# Patient Record
Sex: Male | Born: 1960
Health system: Southern US, Community
[De-identification: ages and names within clinical notes are randomized; demographics above are authoritative.]

## PROBLEM LIST (undated history)

## (undated) DIAGNOSIS — F32A Depression, unspecified: Secondary | ICD-10-CM

## (undated) DIAGNOSIS — I10 Essential (primary) hypertension: Secondary | ICD-10-CM

## (undated) DIAGNOSIS — M1990 Unspecified osteoarthritis, unspecified site: Secondary | ICD-10-CM

## (undated) DIAGNOSIS — M519 Unspecified thoracic, thoracolumbar and lumbosacral intervertebral disc disorder: Secondary | ICD-10-CM

## (undated) DIAGNOSIS — M5126 Other intervertebral disc displacement, lumbar region: Secondary | ICD-10-CM

## (undated) DIAGNOSIS — M62838 Other muscle spasm: Secondary | ICD-10-CM

## (undated) DIAGNOSIS — F419 Anxiety disorder, unspecified: Secondary | ICD-10-CM

## (undated) DIAGNOSIS — F329 Major depressive disorder, single episode, unspecified: Secondary | ICD-10-CM

## (undated) DIAGNOSIS — K5900 Constipation, unspecified: Secondary | ICD-10-CM

## (undated) HISTORY — PX: FOOT SURGERY: SHX648

## (undated) HISTORY — DX: Constipation, unspecified: K59.00

## (undated) HISTORY — PX: POLYPECTOMY: SHX149

## (undated) HISTORY — DX: Other muscle spasm: M62.838

## (undated) HISTORY — PX: COLONOSCOPY: SHX174

---

## 1998-03-01 ENCOUNTER — Emergency Department (HOSPITAL_COMMUNITY): Admission: EM | Admit: 1998-03-01 | Discharge: 1998-03-01 | Payer: Self-pay | Admitting: Emergency Medicine

## 2001-09-02 ENCOUNTER — Emergency Department (HOSPITAL_COMMUNITY): Admission: EM | Admit: 2001-09-02 | Discharge: 2001-09-02 | Payer: Self-pay | Admitting: Emergency Medicine

## 2001-11-10 ENCOUNTER — Emergency Department (HOSPITAL_COMMUNITY): Admission: EM | Admit: 2001-11-10 | Discharge: 2001-11-10 | Payer: Self-pay | Admitting: Emergency Medicine

## 2002-01-02 ENCOUNTER — Emergency Department (HOSPITAL_COMMUNITY): Admission: EM | Admit: 2002-01-02 | Discharge: 2002-01-02 | Payer: Self-pay | Admitting: Emergency Medicine

## 2002-01-02 ENCOUNTER — Encounter: Payer: Self-pay | Admitting: Emergency Medicine

## 2002-02-05 ENCOUNTER — Emergency Department (HOSPITAL_COMMUNITY): Admission: EM | Admit: 2002-02-05 | Discharge: 2002-02-05 | Payer: Self-pay | Admitting: Emergency Medicine

## 2003-11-20 ENCOUNTER — Emergency Department (HOSPITAL_COMMUNITY): Admission: EM | Admit: 2003-11-20 | Discharge: 2003-11-20 | Payer: Self-pay | Admitting: Emergency Medicine

## 2004-02-04 ENCOUNTER — Emergency Department (HOSPITAL_COMMUNITY): Admission: EM | Admit: 2004-02-04 | Discharge: 2004-02-04 | Payer: Self-pay | Admitting: Emergency Medicine

## 2007-04-17 ENCOUNTER — Emergency Department (HOSPITAL_COMMUNITY): Admission: EM | Admit: 2007-04-17 | Discharge: 2007-04-17 | Payer: Self-pay | Admitting: Emergency Medicine

## 2009-02-14 ENCOUNTER — Emergency Department (HOSPITAL_COMMUNITY): Admission: EM | Admit: 2009-02-14 | Discharge: 2009-02-14 | Payer: Self-pay | Admitting: Emergency Medicine

## 2009-03-03 ENCOUNTER — Emergency Department (HOSPITAL_COMMUNITY): Admission: EM | Admit: 2009-03-03 | Discharge: 2009-03-03 | Payer: Self-pay | Admitting: Emergency Medicine

## 2009-05-23 ENCOUNTER — Emergency Department (HOSPITAL_COMMUNITY): Admission: EM | Admit: 2009-05-23 | Discharge: 2009-05-23 | Payer: Self-pay | Admitting: Emergency Medicine

## 2009-08-13 ENCOUNTER — Encounter (INDEPENDENT_AMBULATORY_CARE_PROVIDER_SITE_OTHER): Payer: Self-pay | Admitting: Family Medicine

## 2009-08-13 ENCOUNTER — Ambulatory Visit: Payer: Self-pay | Admitting: Internal Medicine

## 2009-08-13 LAB — CONVERTED CEMR LAB
ALT: 47 units/L (ref 0–53)
AST: 42 units/L — ABNORMAL HIGH (ref 0–37)
Albumin: 4.2 g/dL (ref 3.5–5.2)
Alkaline Phosphatase: 77 units/L (ref 39–117)
BUN: 12 mg/dL (ref 6–23)
Basophils Absolute: 0.1 10*3/uL (ref 0.0–0.1)
Basophils Relative: 1 % (ref 0–1)
CO2: 26 meq/L (ref 19–32)
Calcium: 8.7 mg/dL (ref 8.4–10.5)
Chloride: 103 meq/L (ref 96–112)
Cholesterol: 150 mg/dL (ref 0–200)
Creatinine, Ser: 1.29 mg/dL (ref 0.40–1.50)
Eosinophils Absolute: 0.2 10*3/uL (ref 0.0–0.7)
Eosinophils Relative: 3 % (ref 0–5)
Glucose, Bld: 87 mg/dL (ref 70–99)
HCT: 43.7 % (ref 39.0–52.0)
HCV Ab: REACTIVE — AB
HDL: 54 mg/dL (ref >39)
Hemoglobin: 15 g/dL (ref 13.0–17.0)
Hep A Total Ab: POSITIVE — AB
Hep B Core Total Ab: POSITIVE — AB
Hep B S Ab: POSITIVE — AB
Hepatitis B Surface Ag: NEGATIVE
LDL Cholesterol: 84 mg/dL (ref 0–99)
Lymphocytes Relative: 34 % (ref 12–46)
Lymphs Abs: 2 10*3/uL (ref 0.7–4.0)
MCHC: 34.3 g/dL (ref 30.0–36.0)
MCV: 86.9 fL (ref 78.0–100.0)
Monocytes Absolute: 0.7 10*3/uL (ref 0.1–1.0)
Monocytes Relative: 12 % (ref 3–12)
Neutro Abs: 2.9 10*3/uL (ref 1.7–7.7)
Neutrophils Relative %: 50 % (ref 43–77)
Platelets: 201 10*3/uL (ref 150–400)
Potassium: 4.6 meq/L (ref 3.5–5.3)
RBC: 5.03 M/uL (ref 4.22–5.81)
RDW: 14.3 % (ref 11.5–15.5)
Sodium: 140 meq/L (ref 135–145)
Total Bilirubin: 0.7 mg/dL (ref 0.3–1.2)
Total CHOL/HDL Ratio: 2.8
Total Protein: 7.2 g/dL (ref 6.0–8.3)
Triglycerides: 58 mg/dL (ref <150)
VLDL: 12 mg/dL (ref 0–40)
WBC: 5.8 10*3/uL (ref 4.0–10.5)

## 2009-09-12 ENCOUNTER — Ambulatory Visit: Payer: Self-pay | Admitting: Internal Medicine

## 2009-09-12 ENCOUNTER — Encounter (INDEPENDENT_AMBULATORY_CARE_PROVIDER_SITE_OTHER): Payer: Self-pay | Admitting: Family Medicine

## 2009-09-12 LAB — CONVERTED CEMR LAB: HCV Quantitative: 901000 intl units/mL — ABNORMAL HIGH (ref <43)

## 2009-10-27 ENCOUNTER — Ambulatory Visit: Payer: Self-pay | Admitting: Family Medicine

## 2009-12-04 ENCOUNTER — Ambulatory Visit: Payer: Self-pay | Admitting: Gastroenterology

## 2010-01-22 ENCOUNTER — Ambulatory Visit (HOSPITAL_COMMUNITY): Admission: RE | Admit: 2010-01-22 | Discharge: 2010-01-22 | Payer: Self-pay | Admitting: Gastroenterology

## 2010-04-20 ENCOUNTER — Emergency Department (HOSPITAL_COMMUNITY): Admission: EM | Admit: 2010-04-20 | Discharge: 2010-04-20 | Payer: Self-pay | Admitting: Emergency Medicine

## 2010-10-11 LAB — BASIC METABOLIC PANEL
BUN: 13 mg/dL (ref 6–23)
CO2: 26 mEq/L (ref 19–32)
Calcium: 8.9 mg/dL (ref 8.4–10.5)
Chloride: 107 mEq/L (ref 96–112)
Creatinine, Ser: 1.04 mg/dL (ref 0.4–1.5)
GFR calc Af Amer: >60 mL/min (ref >60)
GFR calc non Af Amer: >60 mL/min (ref >60)
Glucose, Bld: 92 mg/dL (ref 70–99)
Potassium: 4 mEq/L (ref 3.5–5.1)
Sodium: 141 mEq/L (ref 135–145)

## 2010-10-11 LAB — CBC
HCT: 46.5 % (ref 39.0–52.0)
MCH: 31.1 pg (ref 26.0–34.0)
MCHC: 34.1 g/dL (ref 30.0–36.0)
MCV: 91 fL (ref 78.0–100.0)
Platelets: 190 10*3/uL (ref 150–400)
RDW: 14.5 % (ref 11.5–15.5)
WBC: 6.2 10*3/uL (ref 4.0–10.5)

## 2010-10-11 LAB — APTT: aPTT: 29 seconds (ref 24–37)

## 2011-01-05 ENCOUNTER — Emergency Department (HOSPITAL_COMMUNITY)
Admission: EM | Admit: 2011-01-05 | Discharge: 2011-01-05 | Disposition: A | Discharge status: Home / Self Care | Payer: Self-pay | Attending: Emergency Medicine | Admitting: Emergency Medicine

## 2011-01-05 DIAGNOSIS — H5789 Other specified disorders of eye and adnexa: Secondary | ICD-10-CM | POA: Insufficient documentation

## 2011-01-05 DIAGNOSIS — Z8619 Personal history of other infectious and parasitic diseases: Secondary | ICD-10-CM | POA: Insufficient documentation

## 2011-01-05 DIAGNOSIS — H00019 Hordeolum externum unspecified eye, unspecified eyelid: Secondary | ICD-10-CM | POA: Insufficient documentation

## 2011-01-05 DIAGNOSIS — H5710 Ocular pain, unspecified eye: Secondary | ICD-10-CM | POA: Insufficient documentation

## 2011-03-29 ENCOUNTER — Emergency Department (HOSPITAL_COMMUNITY)
Admission: EM | Admit: 2011-03-29 | Discharge: 2011-03-29 | Disposition: A | Discharge status: Home / Self Care | Payer: Self-pay | Attending: Emergency Medicine | Admitting: Emergency Medicine

## 2011-03-29 DIAGNOSIS — B1920 Unspecified viral hepatitis C without hepatic coma: Secondary | ICD-10-CM | POA: Insufficient documentation

## 2011-03-29 DIAGNOSIS — H0019 Chalazion unspecified eye, unspecified eyelid: Secondary | ICD-10-CM | POA: Insufficient documentation

## 2011-03-29 DIAGNOSIS — H5789 Other specified disorders of eye and adnexa: Secondary | ICD-10-CM | POA: Insufficient documentation

## 2011-05-06 LAB — URINALYSIS, ROUTINE W REFLEX MICROSCOPIC
Bilirubin Urine: NEGATIVE
Glucose, UA: NEGATIVE
Hgb urine dipstick: NEGATIVE
Ketones, ur: NEGATIVE
Specific Gravity, Urine: 1.013
pH: 6

## 2011-07-12 ENCOUNTER — Encounter: Payer: Self-pay | Admitting: Emergency Medicine

## 2011-07-12 ENCOUNTER — Emergency Department (HOSPITAL_COMMUNITY)
Admission: EM | Admit: 2011-07-12 | Discharge: 2011-07-12 | Disposition: A | Discharge status: Home / Self Care | Payer: Self-pay | Attending: Emergency Medicine | Admitting: Emergency Medicine

## 2011-07-12 DIAGNOSIS — H53149 Visual discomfort, unspecified: Secondary | ICD-10-CM | POA: Insufficient documentation

## 2011-07-12 DIAGNOSIS — H5710 Ocular pain, unspecified eye: Secondary | ICD-10-CM | POA: Insufficient documentation

## 2011-07-12 DIAGNOSIS — H109 Unspecified conjunctivitis: Secondary | ICD-10-CM | POA: Insufficient documentation

## 2011-07-12 MED ORDER — TETRACAINE HCL 0.5 % OP SOLN
1.0000 [drp] | Freq: Once | OPHTHALMIC | Status: AC
  Administered 2011-07-12: 1 [drp] via OPHTHALMIC
  Filled 2011-07-12: qty 2

## 2011-07-12 MED ORDER — FLUORESCEIN SODIUM 1 MG OP STRP
2.0000 | ORAL_STRIP | Freq: Once | OPHTHALMIC | Status: AC
  Administered 2011-07-12: 2 via OPHTHALMIC
  Filled 2011-07-12: qty 2

## 2011-07-12 MED ORDER — NEOMYCIN-POLYMYXIN-HC 5-10000-1 OP SUSP: 2.0000 [drp] | Freq: Four times a day (QID) | OPHTHALMIC | Status: AC

## 2011-07-12 NOTE — ED Provider Notes (Signed)
History     CSN: [id number] Arrival date & time: 07/12/2011 11:21 AM   First MD Initiated Contact with Patient 07/12/11 1534      Chief Complaint  Patient presents with  . Eye Pain    (Consider location/radiation/quality/duration/timing/severity/associated sxs/prior treatment) Patient is a 50 y.o. male presenting with eye pain.  Eye Pain  Patient is a 50 year old male who presents today complaining of 2 days of bilateral eye pain. He says that the pain is more severe in the morning. He describes this as mild. Patient has a gritty sensation in his eyes as well as photophobia. He has no associated nausea, vomiting, or fevers. Patient has no history of medical problems. He has had history of chalazion in the past. He's been treated by an ophthalmologist for this with antibiotics. He feels that though there are residual lumps underneath his eyelid that these are improving. He does not have any known sick contacts with conjunctivitis. Patient denies any upper respiratory symptoms. He has bilateral scleral injection. He has no changes in his visual acuity. There are no other associated or modifying factors.  History reviewed. No pertinent past medical history.  History reviewed. No pertinent past surgical history.  History reviewed. No pertinent family history.  History  Substance Use Topics  . Smoking status: Current Everyday Smoker  . Smokeless tobacco: Not on file  . Alcohol Use: No      Review of Systems  Constitutional: Negative.   HENT: Negative.   Eyes: Positive for photophobia, pain, discharge, redness and itching. Negative for visual disturbance.  Respiratory: Negative.   Cardiovascular: Negative.   Gastrointestinal: Negative.   Genitourinary: Negative.   Musculoskeletal: Negative.   Skin: Negative.   Neurological: Negative.   Hematological: Negative.   Psychiatric/Behavioral: Negative.   All other systems reviewed and are negative.    Allergies  Review of  patient's allergies indicates no known allergies.  Home Medications   Current Outpatient Rx  Name Route Sig Dispense Refill  . ACETAMINOPHEN 500 MG PO TABS Oral Take 1,000 mg by mouth every 6 (six) hours as needed. For pain     . IBUPROFEN 200 MG PO TABS Oral Take 600 mg by mouth every 6 (six) hours as needed. For pain     . THERA M PLUS PO TABS Oral Take 1 tablet by mouth daily.        BP 141/98  Pulse 70  Temp(Src) 98.2 F (36.8 C) (Oral)  Resp 16  SpO2 99%  Physical Exam  Nursing note and vitals reviewed. Constitutional: He is oriented to person, place, and time. He appears well-developed and well-nourished. No distress.  HENT:  Head: Normocephalic and atraumatic.  Eyes: EOM are normal. Pupils are equal, round, and reactive to light. Right eye exhibits no hordeolum. Left eye exhibits no hordeolum. Right conjunctiva is injected. Left conjunctiva is injected.  Slit lamp exam:      The right eye shows no corneal abrasion, no corneal flare, no corneal ulcer, no foreign body and no fluorescein uptake.       The left eye shows no corneal abrasion, no corneal flare, no corneal ulcer, no foreign body and no fluorescein uptake.         Graphic indicates location of what appear to be chalazions which has been identified and managed previously by patient's ophthalmologist. Denies any tenderness to these today and states that they have improved. Fluroscein  testing is negative  Neck: Normal range of motion.  Musculoskeletal: Normal range of  motion.  Neurological: He is alert and oriented to person, place, and time. No cranial nerve deficit. He exhibits normal muscle tone. Coordination normal.  Skin: Skin is warm and dry. No rash noted.  Psychiatric: He has a normal mood and affect.    ED Course  Procedures (including critical care time)  Labs Reviewed - No data to display No results found.   1. Conjunctivitis       MDM  Patient was evaluated by myself. He had symptoms for 2  days of possible conjunctivitis. Patient does have a history of chalazion. He did not feel like this is the problem today. Patient is not a contact lens wearer. Fluorescein exam as well as slit-lamp exam were performed.  4:40 PM Patient had unimpressive slit-lamp exam. He did have bilateral conjunctival injection. Patient was told to followup with his ophthalmologist. He reports that he only came here today as he was unable to be seen until Thursday. Patient was discharged with a prescription for antibiotic eyedrops. Patient was comfortable with plan and left in good condition.       Cyndra Numbers, MD 07/12/11 [phone]

## 2011-07-12 NOTE — ED Notes (Signed)
Pt presents to department for evaluation of bilateral eye pain and irritation. Onset yesterday morning. Pt states redness and itching to both eyes. Also states blurred vision. 7/10 pain at the time. No signs of distress noted.

## 2011-07-12 NOTE — ED Notes (Signed)
Pt c/o bilateral eye pain and redness with itching starting yesterday

## 2012-02-07 ENCOUNTER — Emergency Department (HOSPITAL_COMMUNITY)
Admission: EM | Admit: 2012-02-07 | Discharge: 2012-02-07 | Disposition: A | Discharge status: Home Health | Payer: Self-pay | Attending: Emergency Medicine | Admitting: Emergency Medicine

## 2012-02-07 ENCOUNTER — Encounter (HOSPITAL_COMMUNITY): Payer: Self-pay | Admitting: Emergency Medicine

## 2012-02-07 ENCOUNTER — Emergency Department (HOSPITAL_COMMUNITY): Payer: Self-pay

## 2012-02-07 DIAGNOSIS — F17200 Nicotine dependence, unspecified, uncomplicated: Secondary | ICD-10-CM | POA: Insufficient documentation

## 2012-02-07 DIAGNOSIS — M25519 Pain in unspecified shoulder: Secondary | ICD-10-CM

## 2012-02-07 DIAGNOSIS — M19019 Primary osteoarthritis, unspecified shoulder: Secondary | ICD-10-CM | POA: Insufficient documentation

## 2012-02-07 MED ORDER — METHOCARBAMOL 500 MG PO TABS: ORAL_TABLET | ORAL | Status: AC

## 2012-02-07 MED ORDER — IBUPROFEN 400 MG PO TABS
600.0000 mg | ORAL_TABLET | Freq: Once | ORAL | Status: AC
  Administered 2012-02-07: 600 mg via ORAL
  Filled 2012-02-07: qty 1

## 2012-02-07 MED ORDER — IBUPROFEN 600 MG PO TABS: 600.0000 mg | ORAL_TABLET | Freq: Three times a day (TID) | ORAL | Status: AC | PRN

## 2012-02-07 NOTE — ED Provider Notes (Signed)
History    This chart was scribed for Suzi Roots, MD, MD by Smitty Pluck. The patient was seen in room TR05C and the patient's care was started at 11:42AM.   CSN: [id number]  Arrival date & time 02/07/12  1115   None     Chief Complaint  Patient presents with  . Shoulder Pain    (Consider location/radiation/quality/duration/timing/severity/associated sxs/prior treatment) Patient is a 51 y.o. male presenting with shoulder pain. The history is provided by the patient.  Shoulder Pain Pertinent negatives include no chest pain and no shortness of breath.   Race Knoblock is a 51 y.o. male who presents to the Emergency Department complaining of constant moderate left shoulder pain onset 3 weeks ago. Pt reports that he has intermittent radiation to elbow and left fingers. Denies injury. Reports having similar symptoms in the past but they subsided on their own. Reports this time is worst. Pt is right hand dominant. Denies fevers. Reports mild neck pain. Movement aggravates the shoulder pain. Denies weakness in left arm. No hx ddd. No neck/ back or shoulder injury. No skin changes, rash or erythema. No loss of sensation or weakness. Right hand dominant.   History reviewed. No pertinent past medical history.  History reviewed. No pertinent past surgical history.  History reviewed. No pertinent family history.  History  Substance Use Topics  . Smoking status: Current Everyday Smoker  . Smokeless tobacco: Not on file  . Alcohol Use: No      Review of Systems  Constitutional: Negative for fever and chills.  Respiratory: Negative for shortness of breath.   Cardiovascular: Negative for chest pain.  Gastrointestinal: Negative for nausea and vomiting.  Neurological: Negative for weakness and numbness.    Allergies  Review of patient's allergies indicates no known allergies.  Home Medications   Current Outpatient Rx  Name Route Sig Dispense Refill  . IBUPROFEN 200 MG PO TABS Oral  Take 1,000 mg by mouth 2 (two) times daily as needed. For pain    . THERA M PLUS PO TABS Oral Take 1 tablet by mouth daily.        BP 147/102  Pulse 68  Temp 98 F (36.7 C) (Oral)  Resp 16  SpO2 100%  Physical Exam  Nursing note and vitals reviewed. Constitutional: He is oriented to person, place, and time. He appears well-developed and well-nourished. No distress.  HENT:  Head: Normocephalic and atraumatic.  Eyes: EOM are normal.  Neck: Neck supple. No tracheal deviation present.  Cardiovascular: Normal rate.   Pulmonary/Chest: Effort normal. No respiratory distress.  Musculoskeletal: Normal range of motion.       Left shoulder tenderness, no sts, no erythema or rash to area of pain.  C/T spine non tender, aligned, no step off. No pain w full rom c spine.  Good rom at left shoulder elbow and wrist without pain. Distal pulses palp.    Neurological: He is alert and oriented to person, place, and time.       LUE nvi with intact radial, median, ulnar nerve function.   Skin: Skin is warm and dry.  Psychiatric: He has a normal mood and affect. His behavior is normal.    ED Course  Procedures (including critical care time) DIAGNOSTIC STUDIES: Oxygen Saturation is 100% on room air, normal by my interpretation.    COORDINATION OF CARE: 12:00PM EDP ordered medication: 600 mg ibuprofen    Dg Shoulder Left  02/07/2012  *RADIOLOGY REPORT*  Clinical Data: Left shoulder pain.  LEFT SHOULDER - 2+ VIEW  Comparison: None.  Findings: No fracture or dislocation.  Mild degenerative changes in the left acromioclavicular joint.  Visualized portion of the left chest is unremarkable.  IMPRESSION: Mild left acromioclavicular joint osteoarthritis.  Original Report Authenticated By: Reyes Ivan, M.D.       MDM  I personally performed the services described in this documentation, which was scribed in my presence. The recorded information has been reviewed and considered. Suzi Roots,  MD    Motrin po.    Suzi Roots, MD 02/07/12 1228

## 2012-02-07 NOTE — ED Notes (Signed)
Pt c/o left shoulder pain x 3 weeks; pt denies obvious injury but sts hx of same; pt sts worse with movement

## 2012-02-18 ENCOUNTER — Emergency Department (HOSPITAL_COMMUNITY)
Admission: EM | Admit: 2012-02-18 | Discharge: 2012-02-19 | Disposition: A | Discharge status: Home / Self Care | Payer: Self-pay | Attending: Emergency Medicine | Admitting: Emergency Medicine

## 2012-02-18 ENCOUNTER — Encounter (HOSPITAL_COMMUNITY): Payer: Self-pay | Admitting: *Deleted

## 2012-02-18 DIAGNOSIS — G8929 Other chronic pain: Secondary | ICD-10-CM

## 2012-02-18 DIAGNOSIS — M25519 Pain in unspecified shoulder: Secondary | ICD-10-CM | POA: Insufficient documentation

## 2012-02-18 DIAGNOSIS — M5412 Radiculopathy, cervical region: Secondary | ICD-10-CM

## 2012-02-18 DIAGNOSIS — M542 Cervicalgia: Secondary | ICD-10-CM | POA: Insufficient documentation

## 2012-02-18 DIAGNOSIS — F17200 Nicotine dependence, unspecified, uncomplicated: Secondary | ICD-10-CM | POA: Insufficient documentation

## 2012-02-18 NOTE — ED Notes (Signed)
C/o shoulder injury, onset 2 weeks ago, seen here for the same, was given ibuprofen and muscle relaxant, ran out, some temporary relief with meds, asked to not be given narcotics at last visit. CMS intact. ROM intact.

## 2012-02-19 MED ORDER — CYCLOBENZAPRINE HCL 10 MG PO TABS: 10.0000 mg | ORAL_TABLET | Freq: Two times a day (BID) | ORAL | Status: AC | PRN

## 2012-02-19 MED ORDER — DICLOFENAC SODIUM 75 MG PO TBEC: 75.0000 mg | DELAYED_RELEASE_TABLET | Freq: Two times a day (BID) | ORAL | Status: DC

## 2012-02-19 MED ORDER — HYDROCODONE-ACETAMINOPHEN 5-325 MG PO TABS: 1.0000 | ORAL_TABLET | Freq: Every evening | ORAL | Status: AC | PRN

## 2012-02-19 NOTE — ED Provider Notes (Signed)
Medical screening examination/treatment/procedure(s) were performed by non-physician practitioner and as supervising physician I was immediately available for consultation/collaboration.   Glynn Octave, MD 02/19/12 [phone]

## 2012-02-19 NOTE — ED Provider Notes (Signed)
History     CSN: [id number]  Arrival date & time 02/18/12  2223   First MD Initiated Contact with Patient 02/19/12 0031      12:34 AM HPI Patient reports left shoulder pain for several months. Reports pain worsened approximately 2 weeks ago. Was seen here and given ibuprofen and muscle relaxers. States medication only helped minimally but he continues to have significant pain. Denies known reports at times pain shoots down left lower extremity and patient reports a tingling sensation in hand. Denies weakness. Reports associated with mild neck pain as well.  Patient is a 51 y.o. male presenting with shoulder pain. The history is provided by the patient.  Shoulder Pain This is a chronic problem. The problem occurs constantly. The problem has been gradually worsening. Associated symptoms include neck pain. Pertinent negatives include no abdominal pain, chest pain, joint swelling, nausea, numbness or weakness. Exacerbated by: Use of left shoulder. He has tried NSAIDs and rest for the symptoms. The treatment provided no relief.    History reviewed. No pertinent past medical history.  Past Surgical History  Procedure Date  . Foot surgery     right, ~ 2002    No family history on file.  History  Substance Use Topics  . Smoking status: Current Everyday Smoker  . Smokeless tobacco: Not on file  . Alcohol Use: No      Review of Systems  HENT: Positive for neck pain.   Cardiovascular: Negative for chest pain.  Gastrointestinal: Negative for nausea and abdominal pain.  Musculoskeletal: Negative for joint swelling.       Shoulder pain, neck pain  Neurological: Negative for weakness and numbness.  All other systems reviewed and are negative.    Allergies  Review of patient's allergies indicates no known allergies.  Home Medications   Current Outpatient Rx  Name Route Sig Dispense Refill  . IBUPROFEN 200 MG PO TABS Oral Take 1,000 mg by mouth 2 (two) times daily as needed. For  pain    . IBUPROFEN 600 MG PO TABS Oral Take 600 mg by mouth every 6 (six) hours as needed. pain    . THERA M PLUS PO TABS Oral Take 1 tablet by mouth daily.        BP 151/105  Pulse 76  Temp 98.6 F (37 C) (Oral)  Resp 16  SpO2 100%  Physical Exam  Constitutional: He is oriented to person, place, and time. He appears well-developed and well-nourished.  HENT:  Head: Normocephalic and atraumatic.  Eyes: Pupils are equal, round, and reactive to light.  Musculoskeletal:       Left shoulder: He exhibits tenderness, pain and spasm. He exhibits normal range of motion, no swelling, normal pulse and normal strength.       Left shoulder: Full range of motion. Tender to palpation over left superior trapezius muscle. Patient otherwise has normal strength, distal pulses, sensation, capillary refill. Patient also has reproduction of tingling sensation with percussion over cervical spine.  Neurological: He is alert and oriented to person, place, and time.  Skin: Skin is warm and dry. No rash noted. No erythema. No pallor.  Psychiatric: He has a normal mood and affect. His behavior is normal.    ED Course  Procedures   MDM   Patient reports an appointment is August and it. We will prescribe patient diclofenac and Flexeril. Will provide patient with Vicodin for pain. Patient voices understanding and is ready for d/c       Ronan Duecker  Cyndie Chime, PA-C 02/19/12 0155

## 2012-04-18 ENCOUNTER — Emergency Department (HOSPITAL_COMMUNITY): Payer: Self-pay

## 2012-04-18 ENCOUNTER — Encounter (HOSPITAL_COMMUNITY): Payer: Self-pay | Admitting: Emergency Medicine

## 2012-04-18 ENCOUNTER — Emergency Department (HOSPITAL_COMMUNITY)
Admission: EM | Admit: 2012-04-18 | Discharge: 2012-04-18 | Disposition: A | Discharge status: Home / Self Care | Payer: Self-pay | Attending: Emergency Medicine | Admitting: Emergency Medicine

## 2012-04-18 DIAGNOSIS — G8929 Other chronic pain: Secondary | ICD-10-CM

## 2012-04-18 DIAGNOSIS — M549 Dorsalgia, unspecified: Secondary | ICD-10-CM | POA: Insufficient documentation

## 2012-04-18 DIAGNOSIS — F17200 Nicotine dependence, unspecified, uncomplicated: Secondary | ICD-10-CM | POA: Insufficient documentation

## 2012-04-18 DIAGNOSIS — M5412 Radiculopathy, cervical region: Secondary | ICD-10-CM

## 2012-04-18 MED ORDER — IBUPROFEN 800 MG PO TABS: 800.0000 mg | ORAL_TABLET | Freq: Three times a day (TID) | ORAL | Status: DC

## 2012-04-18 MED ORDER — HYDROCODONE-ACETAMINOPHEN 5-325 MG PO TABS
2.0000 | ORAL_TABLET | Freq: Once | ORAL | Status: AC
  Administered 2012-04-18: 2 via ORAL
  Filled 2012-04-18: qty 2

## 2012-04-18 MED ORDER — PREDNISONE 20 MG PO TABS: 40.0000 mg | ORAL_TABLET | Freq: Every day | ORAL | Status: DC

## 2012-04-18 MED ORDER — HYDROCODONE-ACETAMINOPHEN 5-325 MG PO TABS: ORAL_TABLET | ORAL | Status: DC

## 2012-04-18 NOTE — ED Notes (Signed)
Patient transported to X-ray 

## 2012-04-18 NOTE — ED Notes (Signed)
Pt c/o left shoulder pain x 4 months; pt denies knowing obvious injury

## 2012-04-18 NOTE — ED Provider Notes (Signed)
History  This chart was scribed for Calvin [name]. Oletta Lamas, MD by Ladona Ridgel Day. This patient was seen in room TR06C/TR06C and the patient's care was started at 1401.   CSN: [id number]  Arrival date & time 04/18/12  1401   None     Chief Complaint  Patient presents with  . Shoulder Pain   The history is provided by the patient. No language interpreter was used.   Calvin [name] is a 51 y.o. male who presents to the Emergency Department complaining of constant gradually worsening radiating left shoulder pain for 4 months and denies trauma or injury to his shoulder. He states his pain goes from his posterior left shoulder to his left elbow. He denies any numbness/tingling. Pain has been constant, will wax and wane at time, but persists.  Was seen twice previously for same in July.  Pt reports has an appt with [facility] next month.  History reviewed. No pertinent past medical history.  Past Surgical History  Procedure Date  . Foot surgery     right, ~ 2002    History reviewed. No pertinent family history.  History  Substance Use Topics  . Smoking status: Current Every Day Smoker  . Smokeless tobacco: Not on file  . Alcohol Use: No      Review of Systems  Constitutional: Negative for fever and chills.  HENT: Positive for neck pain.   Eyes: Negative for redness.  Respiratory: Negative for shortness of breath.   Cardiovascular: Negative for chest pain, palpitations and leg swelling.  Gastrointestinal: Positive for abdominal pain. Negative for nausea, vomiting, constipation and blood in stool.  Musculoskeletal: Positive for arthralgias. Negative for myalgias and back pain.       Left shoulder tender/painful  Skin: Negative for rash and wound.  Neurological: Negative for weakness.    Allergies  Review of patient's allergies indicates no known allergies.  Home Medications   Current Outpatient Rx  Name Route Sig Dispense Refill  . ACETAMINOPHEN 325 MG PO TABS Oral Take 650 mg by  mouth every 6 (six) hours as needed. For pain    . IBUPROFEN 200 MG PO TABS Oral Take 1,000 mg by mouth 2 (two) times daily as needed. For pain    . THERA M PLUS PO TABS Oral Take 1 tablet by mouth daily.      Marland Kitchen HYDROCODONE-ACETAMINOPHEN 5-325 MG PO TABS  1-2 tablets po q 6 hours prn moderate to severe pain 20 tablet 0  . IBUPROFEN 800 MG PO TABS Oral Take 1 tablet (800 mg total) by mouth 3 (three) times daily. 21 tablet 0  . PREDNISONE 20 MG PO TABS Oral Take 2 tablets (40 mg total) by mouth daily. 14 tablet 0    Triage Vitals: BP 153/98  Pulse 72  Temp 98.5 F (36.9 C) (Oral)  Resp 16  SpO2 98%  Physical Exam  Nursing note and vitals reviewed. Constitutional: He is oriented to person, place, and time. He appears well-developed and well-nourished. No distress.  HENT:  Head: Normocephalic and atraumatic.  Eyes: EOM are normal. Pupils are equal, round, and reactive to light.  Neck: Neck supple. No tracheal deviation present.  Cardiovascular: Normal rate.   Pulmonary/Chest: Effort normal and breath sounds normal. No respiratory distress.  Abdominal: Soft. He exhibits no distension. There is no tenderness.  Musculoskeletal: He exhibits tenderness (Full ROM left shoulder with mild discomfort. tender to palpation over left superior trapezius muscle).       Left shoulder: He exhibits  decreased range of motion, tenderness and pain. He exhibits no bony tenderness, no swelling, no deformity, no laceration, normal pulse and normal strength.  Neurological: He is alert and oriented to person, place, and time. He has normal strength. He displays no atrophy. No sensory deficit. He exhibits normal muscle tone. Coordination and gait normal. GCS eye subscore is 4. GCS verbal subscore is 5. GCS motor subscore is 6.       5/5 strength of LUE   Skin: Skin is warm and dry.  Psychiatric: He has a normal mood and affect. His behavior is normal.    ED Course  Procedures (including critical care  time) DIAGNOSTIC STUDIES: Oxygen Saturation is 98% on room air, normal by my interpretation.    COORDINATION OF CARE: At 300 PM Discussed treatment plan with patient which includes left shoulder X-ray, steroids/pain medicine and follow up with orthopedist. Patient agrees.   Labs Reviewed - No data to display Dg Shoulder Left  04/18/2012  *RADIOLOGY REPORT*  Clinical Data: No reported injury.  Pain radiating down arm to the hand  LEFT SHOULDER - 2+ VIEW  Comparison: 02/07/2012  Findings: Left AC joint arthritis redemonstrated.  No glenohumeral fracture or dislocation.  Adjacent ribs intact.  IMPRESSION: Stable exam.  No acute findings.   Original Report Authenticated By: Elsie Stain, M.D.      1. Cervical radiculopathy   2. Chronic upper back pain     Pt's symptoms are not worse based on today's description and that given at end of July.  Pt has appt with ortho in a couple of weeks.  Pt is ok to keep already scheduled appt.  Will provide Rx for NSAIDs, narcotics and muscle relaxant.   MDM  I personally performed the services described in this documentation, which was scribed in my presence. The recorded information has been reviewed and considered.         Calvin [name]. Opie Maclaughlin, MD 04/19/12 1417

## 2012-04-18 NOTE — Discharge Instructions (Signed)
Cervical Radiculopathy Cervical radiculopathy happens when a nerve in the neck is pinched or bruised by a slipped (herniated) disk or by arthritic changes in the bones of the cervical spine. This can occur due to an injury or as part of the normal aging process. Pressure on the cervical nerves can cause pain or numbness that runs from your neck all the way down into your arm and fingers. CAUSES  There are many possible causes, including:  Injury.   Muscle tightness in the neck from overuse.   Swollen, painful joints (arthritis).   Breakdown or degeneration in the bones and joints of the spine (spondylosis) due to aging.   Bone spurs that may develop near the cervical nerves.  SYMPTOMS  Symptoms include pain, weakness, or numbness in the affected arm and hand. Pain can be severe or irritating. Symptoms may be worse when extending or turning the neck. DIAGNOSIS  Your caregiver will ask about your symptoms and do a physical exam. He or she may test your strength and reflexes. X-rays, CT scans, and MRI scans may be needed in cases of injury or if the symptoms do not go away after a period of time. Electromyography (EMG) or nerve conduction testing may be done to study how your nerves and muscles are working. TREATMENT  Your caregiver may recommend certain exercises to help relieve your symptoms. Cervical radiculopathy can, and often does, get better with time and treatment. If your problems continue, treatment options may include:  Wearing a soft collar for short periods of time.   Physical therapy to strengthen the neck muscles.   Medicines, such as nonsteroidal anti-inflammatory drugs (NSAIDs), oral corticosteroids, or spinal injections.   Surgery. Different types of surgery may be done depending on the cause of your problems.  HOME CARE INSTRUCTIONS   Put ice on the affected area.   Put ice in a plastic bag.   Place a towel between your skin and the bag.   Leave the ice on for 15  to 20 minutes, 3 to 4 times a day or as directed by your caregiver.   Use a flat pillow when you sleep.   Only take over-the-counter or prescription medicines for pain, discomfort, or fever as directed by your caregiver.   If physical therapy was prescribed, follow your caregiver's directions.   If a soft collar was prescribed, use it as directed.  SEEK IMMEDIATE MEDICAL CARE IF:   Your pain gets much worse and cannot be controlled with medicines.   You have weakness or numbness in your hand, arm, face, or leg.   You have a high fever or a stiff, rigid neck.   You lose bowel or bladder control (incontinence).   You have trouble with walking, balance, or speaking.  MAKE SURE YOU:   Understand these instructions.   Will watch your condition.   Will get help right away if you are not doing well or get worse.  Document Released: 04/06/2001 Document Revised: 07/01/2011 Document Reviewed: 02/23/2011 [facility] Patient Information 2012 [place], [place].    Narcotic and benzodiazepine use may cause drowsiness, slowed breathing or dependence.  Please use with caution and do not drive, operate machinery or watch young children alone while taking them.  Taking combinations of these medications or drinking alcohol will potentiate these effects.

## 2012-04-19 ENCOUNTER — Encounter (HOSPITAL_COMMUNITY): Payer: Self-pay | Admitting: Emergency Medicine

## 2012-05-05 ENCOUNTER — Encounter (HOSPITAL_COMMUNITY): Payer: Self-pay | Admitting: Nurse Practitioner

## 2012-05-05 ENCOUNTER — Emergency Department (HOSPITAL_COMMUNITY)
Admission: EM | Admit: 2012-05-05 | Discharge: 2012-05-05 | Disposition: A | Discharge status: Home / Self Care | Payer: Self-pay | Attending: Emergency Medicine | Admitting: Emergency Medicine

## 2012-05-05 DIAGNOSIS — M1990 Unspecified osteoarthritis, unspecified site: Secondary | ICD-10-CM

## 2012-05-05 DIAGNOSIS — F17200 Nicotine dependence, unspecified, uncomplicated: Secondary | ICD-10-CM | POA: Insufficient documentation

## 2012-05-05 DIAGNOSIS — G729 Myopathy, unspecified: Secondary | ICD-10-CM

## 2012-05-05 DIAGNOSIS — M4712 Other spondylosis with myelopathy, cervical region: Secondary | ICD-10-CM | POA: Insufficient documentation

## 2012-05-05 MED ORDER — OXYCODONE-ACETAMINOPHEN 5-325 MG PO TABS: 1.0000 | ORAL_TABLET | ORAL | Status: DC | PRN

## 2012-05-05 MED ORDER — PREDNISONE 10 MG PO TABS: 10.0000 mg | ORAL_TABLET | Freq: Once | ORAL | Status: DC

## 2012-05-05 NOTE — ED Notes (Signed)
Pt presents with onset of L scapular, shoulder pain and neck pain after awakening this morning.  Pt denies any injury, reports arthritis to shoulder.  Pt reports pain radiates into L scapula and down back, into neck.  Pt has difficulty moving neck.

## 2012-05-05 NOTE — ED Notes (Signed)
C/o neck and L shoulder pain for past days, history of "pinched nerve in my neck." pain increasingly worse over past year
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
## 2013-07-05 ENCOUNTER — Encounter (HOSPITAL_COMMUNITY): Payer: Self-pay | Admitting: Emergency Medicine

## 2013-07-05 ENCOUNTER — Emergency Department (HOSPITAL_COMMUNITY)
Admission: EM | Admit: 2013-07-05 | Discharge: 2013-07-05 | Disposition: A | Discharge status: Home / Self Care | Payer: No Typology Code available for payment source | Attending: Emergency Medicine | Admitting: Emergency Medicine

## 2013-07-05 DIAGNOSIS — F17200 Nicotine dependence, unspecified, uncomplicated: Secondary | ICD-10-CM | POA: Insufficient documentation

## 2013-07-05 DIAGNOSIS — G8929 Other chronic pain: Secondary | ICD-10-CM | POA: Insufficient documentation

## 2013-07-05 DIAGNOSIS — IMO0002 Reserved for concepts with insufficient information to code with codable children: Secondary | ICD-10-CM | POA: Insufficient documentation

## 2013-07-05 DIAGNOSIS — M5417 Radiculopathy, lumbosacral region: Secondary | ICD-10-CM

## 2013-07-05 MED ORDER — HYDROCODONE-ACETAMINOPHEN 5-325 MG PO TABS: 1.0000 | ORAL_TABLET | Freq: Four times a day (QID) | ORAL | Status: DC | PRN

## 2013-07-05 MED ORDER — PREDNISONE 10 MG PO TABS: ORAL_TABLET | ORAL | Status: DC

## 2013-07-05 MED ORDER — HYDROCODONE-ACETAMINOPHEN 5-325 MG PO TABS
1.0000 | ORAL_TABLET | Freq: Once | ORAL | Status: AC
  Administered 2013-07-05: 1 via ORAL
  Filled 2013-07-05: qty 1

## 2013-07-05 MED ORDER — PREDNISONE 20 MG PO TABS
60.0000 mg | ORAL_TABLET | Freq: Once | ORAL | Status: AC
  Administered 2013-07-05: 60 mg via ORAL
  Filled 2013-07-05: qty 3

## 2013-07-05 NOTE — ED Notes (Signed)
Back pain x 1 1/2 months states was stretching and hurt it  Lower part hurts worse

## 2013-07-05 NOTE — ED Notes (Signed)
Pt.s family member was driving pt. home

## 2013-07-05 NOTE — ED Provider Notes (Signed)
  Medical screening examination/treatment/procedure(s) were performed by non-physician practitioner and as supervising physician I was immediately available for consultation/collaboration.  EKG Interpretation   None          Huxley Vanwagoner, MD 07/05/13 1627 

## 2013-07-05 NOTE — ED Provider Notes (Signed)
CSN: [id number]     Arrival date & time 07/05/13  1359 History  This chart was scribed for non-physician practitioner, Jaynie Crumble, PA-C working with Gerhard Munch, MD by Luisa Dago, ED scribe. This patient was seen in room TR07C/TR07C and the patient's care was started at 3:25 PM.    Chief Complaint  Patient presents with  . Back Pain   The history is provided by the patient. No language interpreter was used.   HPI Comments: Calvin [name] is a 52 y.o. male who presents to the Emergency Department complaining of lower back pain that started 1.5 months ago. Pt complains of numbness that radiates down his legs and it is causing his left toe to curl up. Pt states that the pain was brought on by him stretching really hard one morning. Pain is sharp. Reports burning in right leg as well.  Walking makes pain worse. Nothing makes it better. Pt is requesting a referral to PCP. Pt denies fever, urinary and bowel incontinence.  History reviewed. No pertinent past medical history. Past Surgical History  Procedure Laterality Date  . Foot surgery      right, ~ 2002   No family history on file. History  Substance Use Topics  . Smoking status: Current Every Day Smoker  . Smokeless tobacco: Not on file  . Alcohol Use: No    Review of Systems  Constitutional: Negative for fever.  Genitourinary:       Negative for bowel and bladder incontinence.  Musculoskeletal: Positive for arthralgias, back pain and myalgias.  Neurological: Positive for numbness.  All other systems reviewed and are negative.    Allergies  Review of patient's allergies indicates no known allergies.  Home Medications   Current Outpatient Rx  Name  Route  Sig  Dispense  Refill  . Cyclobenzaprine HCl (FLEXERIL PO)   Oral   Take 1 tablet by mouth every 6 (six) hours as needed (for pain).         Marland Kitchen ibuprofen (ADVIL,MOTRIN) 800 MG tablet   Oral   Take 800 mg by mouth every 6 (six) hours as needed for mild pain  or moderate pain.         . traMADol (ULTRAM) 50 MG tablet   Oral   Take 50 mg by mouth every 6 (six) hours as needed for moderate pain.          BP 164/95  Pulse 88  Temp(Src) 98.2 F (36.8 C)  Resp 16  Wt 170 lb (77.111 kg)  SpO2 100% Physical Exam  Nursing note and vitals reviewed. Constitutional: He is oriented to person, place, and time. He appears well-developed and well-nourished. No distress.  HENT:  Head: Normocephalic and atraumatic.  Eyes: EOM are normal.  Neck: Neck supple. No tracheal deviation present.  Cardiovascular: Normal rate.   Pulmonary/Chest: Effort normal. No respiratory distress.  Musculoskeletal: Normal range of motion.  Midline lumbar spine tenderness and paravertebral tenderness.Tenderness in the left SI joint. Pain with bilateral straight leg raise.  Neurological: He is alert and oriented to person, place, and time.  Reflex Scores:      Patellar reflexes are 3+ on the right side. Good strength with bilateral foot dorsal flexion and great toe flexion. Decreased sensation to the right lower abdomen, and left posterior lower leg. Rest of sensation intact and equal.  Skin: Skin is warm and dry.  Psychiatric: He has a normal mood and affect. His behavior is normal.    ED Course  Procedures (  including critical care time)  DIAGNOSTIC STUDIES: Oxygen Saturation is 100% on room air, normal by my interpretation.    COORDINATION OF CARE: 3:32 PM-Discussed treatment plan which includes referral to PCP, steroids, and pain medication with pt at bedside and pt agreed to plan.   Labs Review Labs Reviewed - No data to display Imaging Review No results found.  EKG Interpretation   None       MDM  No diagnosis found.  PT with chronic back pain. On exam concern for possible nerve impingement. However strength and reflexes intact. No sings of cauda equina this time. He is ambulatory. Will d/c home with close follow up. Prednisone and norco for pain.    Filed Vitals:   07/05/13 1409  BP: 164/95  Pulse: 88  Temp: 98.2 F (36.8 C)  Resp: 16  Weight: 170 lb (77.111 kg)  SpO2: 100%    I personally performed the services described in this documentation, which was scribed in my presence. The recorded information has been reviewed and is accurate.    Lottie Mussel, PA-C 07/05/13 1544

## 2013-07-18 ENCOUNTER — Emergency Department (HOSPITAL_COMMUNITY)
Admission: EM | Admit: 2013-07-18 | Discharge: 2013-07-18 | Disposition: A | Discharge status: Home / Self Care | Payer: No Typology Code available for payment source | Attending: Emergency Medicine | Admitting: Emergency Medicine

## 2013-07-18 ENCOUNTER — Encounter (HOSPITAL_COMMUNITY): Payer: Self-pay | Admitting: Emergency Medicine

## 2013-07-18 DIAGNOSIS — M5430 Sciatica, unspecified side: Secondary | ICD-10-CM | POA: Insufficient documentation

## 2013-07-18 DIAGNOSIS — M5442 Lumbago with sciatica, left side: Secondary | ICD-10-CM

## 2013-07-18 DIAGNOSIS — IMO0001 Reserved for inherently not codable concepts without codable children: Secondary | ICD-10-CM | POA: Insufficient documentation

## 2013-07-18 DIAGNOSIS — M25519 Pain in unspecified shoulder: Secondary | ICD-10-CM | POA: Insufficient documentation

## 2013-07-18 DIAGNOSIS — G8929 Other chronic pain: Secondary | ICD-10-CM | POA: Insufficient documentation

## 2013-07-18 DIAGNOSIS — F17200 Nicotine dependence, unspecified, uncomplicated: Secondary | ICD-10-CM | POA: Insufficient documentation

## 2013-07-18 DIAGNOSIS — R52 Pain, unspecified: Secondary | ICD-10-CM | POA: Insufficient documentation

## 2013-07-18 MED ORDER — DIAZEPAM 5 MG PO TABS: 5.0000 mg | ORAL_TABLET | Freq: Three times a day (TID) | ORAL | Status: DC | PRN

## 2013-07-18 MED ORDER — PREDNISONE 20 MG PO TABS: 60.0000 mg | ORAL_TABLET | Freq: Every day | ORAL | Status: DC

## 2013-07-18 MED ORDER — DIAZEPAM 5 MG PO TABS
5.0000 mg | ORAL_TABLET | Freq: Once | ORAL | Status: AC
  Administered 2013-07-18: 5 mg via ORAL
  Filled 2013-07-18: qty 1

## 2013-07-18 MED ORDER — HYDROCODONE-ACETAMINOPHEN 5-325 MG PO TABS: 1.0000 | ORAL_TABLET | Freq: Four times a day (QID) | ORAL | Status: DC | PRN

## 2013-07-18 NOTE — ED Provider Notes (Signed)
Medical screening examination/treatment/procedure(s) were performed by non-physician practitioner and as supervising physician I was immediately available for consultation/collaboration.  EKG Interpretation   None       Lebert Lovern, MD, FACEP   Niki Payment L Karris Deangelo, MD 07/18/13 1613 

## 2013-07-18 NOTE — ED Provider Notes (Signed)
CSN: [id number]     Arrival date & time 07/18/13  1340 History  This chart was scribed for non-physician practitioner, Milus Mallick, PA-C working with Ward Givens, MD by Greggory Stallion, ED scribe. This patient was seen in room TR05C/TR05C and the patient's care was started at 2:50 PM.   Chief Complaint  Patient presents with  . Back Pain   The history is provided by the patient. No language interpreter was used.   HPI Comments: Calvin [name] is a 52 y.o. male who presents to the Emergency Department complaining of intermittent lower back pain that radiates into his left leg that started several weeks ago and has continued since his last ER visit a few weeks ago. The patient states his left knee gave out, but he did not fall or have any other acute injury since being elevated a few weeks ago. Patient has discussed his back with his PCP who has an scheduled him appointment with physical therapy in a few weeks. Patient is also complaining his chronic shoulder pain is "acting up" without new injury or symptoms. Denies any fevers, bladder or bowel incontinence, hx of cancer, hx IVDA, new or changing numbness or weakness.   History reviewed. No pertinent past medical history. Past Surgical History  Procedure Laterality Date  . Foot surgery      right, ~ 2002   History reviewed. No pertinent family history. History  Substance Use Topics  . Smoking status: Current Every Day Smoker  . Smokeless tobacco: Not on file  . Alcohol Use: No    Review of Systems  Eyes: Negative for visual disturbance.  Gastrointestinal: Negative for vomiting.  Genitourinary:       Negative for bowel or bladder incontinence.  Musculoskeletal: Positive for back pain and myalgias.    Allergies  Review of patient's allergies indicates no known allergies.  Home Medications   Current Outpatient Rx  Name  Route  Sig  Dispense  Refill  . acetaminophen (TYLENOL) 500 MG tablet   Oral   Take 1,000 mg by mouth every  6 (six) hours as needed.         . Multiple Vitamins-Minerals (MULTIVITAMIN PO)   Oral   Take 1 tablet by mouth daily.         . sodium chloride (OCEAN) 0.65 % SOLN nasal spray   Each Nare   Place 1 spray into both nostrils as needed for congestion.         . diazepam (VALIUM) 5 MG tablet   Oral   Take 1 tablet (5 mg total) by mouth every 8 (eight) hours as needed for muscle spasms.   15 tablet   0   . HYDROcodone-acetaminophen (NORCO/VICODIN) 5-325 MG per tablet   Oral   Take 1-2 tablets by mouth every 6 (six) hours as needed for severe pain.   8 tablet   0   . predniSONE (DELTASONE) 20 MG tablet   Oral   Take 3 tablets (60 mg total) by mouth daily.   15 tablet   0    BP 169/114  Pulse 83  Temp(Src) 98.6 F (37 C) (Oral)  Resp 16  Ht 6\' 1"  (1.854 m)  Wt 175 lb (79.379 kg)  BMI 23.09 kg/m2  SpO2 98%  Physical Exam  Constitutional: He is oriented to person, place, and time. He appears well-developed and well-nourished. No distress.  HENT:  Head: Normocephalic and atraumatic.  Right Ear: External ear normal.  Left Ear: External ear normal.  Nose: Nose normal.  Mouth/Throat: Oropharynx is clear and moist. No oropharyngeal exudate.  Eyes: Conjunctivae and EOM are normal. Pupils are equal, round, and reactive to light.  Neck: Normal range of motion. Neck supple.  Cardiovascular: Normal rate, regular rhythm, normal heart sounds and intact distal pulses.   Pulses:      Dorsalis pedis pulses are 2+ on the right side, and 2+ on the left side.       Posterior tibial pulses are 2+ on the right side, and 2+ on the left side.  Pulmonary/Chest: Effort normal and breath sounds normal. No respiratory distress.  Abdominal: Soft. There is no tenderness.  Musculoskeletal:       Lumbar back: He exhibits tenderness. He exhibits normal range of motion and no bony tenderness.       Arms: Negative empty can test Negative Adson's maneuver.  ROM intact with Apley scratch test.    Neurological: He is alert and oriented to person, place, and time. He has normal strength. No cranial nerve deficit or sensory deficit. Gait normal. GCS eye subscore is 4. GCS verbal subscore is 5. GCS motor subscore is 6.  No pronator drift. Bilateral heel-knee-shin intact.  Skin: Skin is warm and dry. He is not diaphoretic.    ED Course  Procedures (including critical care time) Medications  diazepam (VALIUM) tablet 5 mg (5 mg Oral Given 07/18/13 1507)    DIAGNOSTIC STUDIES: Oxygen Saturation is 98% on RA, normal by my interpretation.    COORDINATION OF CARE: 2:54 PM-Discussed treatment plan which includes a muscle relaxer, steroid and pain medication with pt at bedside and pt agreed to plan.   Labs Review Labs Reviewed - No data to display Imaging Review No results found.  EKG Interpretation   None       MDM   1. Acute back pain with sciatica, left   2. Chronic shoulder pain, left     Afebrile, NAD, non-toxic appearing, AAOx4. Neurovascularly intact. Normal sensation.   1) Back Pain w/ sciatica: Patient with back pain.  No neurological deficits and normal neuro exam.  Patient can walk but states is painful.  No loss of bowel or bladder control.  No concern for cauda equina.  No fever, night sweats, weight loss, h/o cancer, IVDU.  RICE protocol and pain medicine indicated and discussed with patient.   2) Chronic Shoulder Pain: No new injury or symptom. PE shows no instability, tenderness, or deformity of acromioclavicular and sternoclavicular joints, the cervical spine, glenohumeral joint, coracoid process, acromion, or scapula. Good shoulder strength during empty can test. Good ROM during scratch test. No signs of impingement on Adson's maneuver.    I personally performed the services described in this documentation, which was scribed in my presence. The recorded information has been reviewed and is accurate.    Jeannetta Ellis, PA-C 07/18/13 1554

## 2013-07-18 NOTE — ED Notes (Signed)
Pt reports having intermittent lower back pain for extended amount of time, denies injury to back. Pain is radiating down left leg. Ambulatory at triage.

## 2013-08-06 ENCOUNTER — Emergency Department (INDEPENDENT_AMBULATORY_CARE_PROVIDER_SITE_OTHER)
Admission: EM | Admit: 2013-08-06 | Discharge: 2013-08-06 | Disposition: A | Discharge status: Home / Self Care | Payer: No Typology Code available for payment source | Source: Home / Self Care | Attending: Family Medicine | Admitting: Family Medicine

## 2013-08-06 ENCOUNTER — Encounter (HOSPITAL_COMMUNITY): Payer: Self-pay | Admitting: Emergency Medicine

## 2013-08-06 DIAGNOSIS — M5430 Sciatica, unspecified side: Secondary | ICD-10-CM

## 2013-08-06 DIAGNOSIS — R29898 Other symptoms and signs involving the musculoskeletal system: Secondary | ICD-10-CM

## 2013-08-06 MED ORDER — HYDROCODONE-ACETAMINOPHEN 5-325 MG PO TABS: 1.0000 | ORAL_TABLET | Freq: Four times a day (QID) | ORAL | Status: DC | PRN

## 2013-08-06 NOTE — ED Provider Notes (Signed)
Calvin [name] is a 53 y.o. male who presents to Urgent Care today for bilateral low back pain radiating to the legs present for the last 5 months. Patient is worsening weakness over the past 2 months. Specifically he has trouble lifting his left foot from the ground while walking. He denies any bowel bladder dysfunction. His pain is moderate and worse with activity. He has been unable to work because of the pain and weakness. He denies any fevers or chills. His last x-ray was normal and about 2 months ago in [facility]. He has recently tried a prednisone Dosepak which did not help and has had one physical therapy appointment which did not help.   History reviewed. No pertinent past medical history. History  Substance Use Topics  . Smoking status: Current Every Day Smoker  . Smokeless tobacco: Not on file  . Alcohol Use: No   ROS as above Medications reviewed. No current facility-administered medications for this encounter.   Current Outpatient Prescriptions  Medication Sig Dispense Refill  . acetaminophen (TYLENOL) 500 MG tablet Take 1,000 mg by mouth every 6 (six) hours as needed.      . diazepam (VALIUM) 5 MG tablet Take 1 tablet (5 mg total) by mouth every 8 (eight) hours as needed for muscle spasms.  15 tablet  0  . HYDROcodone-acetaminophen (NORCO/VICODIN) 5-325 MG per tablet Take 1-2 tablets by mouth every 6 (six) hours as needed for severe pain.  8 tablet  0  . HYDROcodone-acetaminophen (NORCO/VICODIN) 5-325 MG per tablet Take 1 tablet by mouth every 6 (six) hours as needed.  15 tablet  0  . Multiple Vitamins-Minerals (MULTIVITAMIN PO) Take 1 tablet by mouth daily.      . predniSONE (DELTASONE) 20 MG tablet Take 3 tablets (60 mg total) by mouth daily.  15 tablet  0  . sodium chloride (OCEAN) 0.65 % SOLN nasal spray Place 1 spray into both nostrils as needed for congestion.        Exam:  BP 175/135  Pulse 69  Temp(Src) 98.7 F (37.1 C) (Oral)  Resp 14  SpO2 100% Gen: Well  NAD BACK:  Nontender to spinal midline. Sensation is intact throughout lower extremities. Reflexes are hyperreflexic but equal bilateral knees and ankles. Strength is 5/5 right hip flexion 4+/5 left hip flexion Knee extension is 5/5 bilaterally. Knee flexion is 5/5 right and 4/5 left. Ankle dorsiflexion 5/5 right 4/5 left Ankle plantar flexion 5/5 right 4+/5 left To dorsiflexion 5/5 right 4/5 left Toe plantar flexion 5/5 right, 4+/5 left Babinski reflex is upgoing bilaterally Antalgic gait Patient can stand on toes and heels and squat   Assessment and Plan: 53 y.o. male with lumbago sciatica. Specifically am concerned about patient's weakness in his left lower extremity. Discussed the case with Dr. [facility]. He'll be worked in [employer]. Believe he would benefit from MRI sooner rather than later. His weakness is present for about 2 months.   Discussed warning signs or symptoms. Please see discharge instructions. Patient expresses understanding.    Gregor Hams, MD 08/06/13 1356

## 2013-08-06 NOTE — Discharge Instructions (Signed)
Thank you for coming in today. Followup with Dr. Alfonso Ramus at [facility].  If you cannot get an appointment with him soon, try Dr. Tamala Julian at [facility] sports medicine [phone] Use Norco for severe pain Come back or go to the emergency room if you notice new weakness new numbness problems walking or bowel or bladder problems.

## 2013-08-06 NOTE — ED Notes (Addendum)
C/o 5 month duration of pain in left shoulder and lower back, which reportedly started when he stretched upon waking one AM, no other injury event. Went to PT x 1 visit, and has another visit scheduled for AM ("I'm going to reschedule that one") c/o his pain is worse after PT, but did not report this to Dr Barrie Folk who ordered the PT . His instruction from Dr Barrie Folk were that he would consider referring him to an ortho after he had completed 5 therapy sessions, but pt requesting ortho referral today. Patient walk w no observable deficit , free and fluid gait

## 2013-08-10 ENCOUNTER — Encounter: Payer: Self-pay | Admitting: Internal Medicine

## 2013-08-10 ENCOUNTER — Ambulatory Visit: Payer: No Typology Code available for payment source | Attending: Internal Medicine | Admitting: Internal Medicine

## 2013-08-10 VITALS — BP 160/100 | HR 90 | Temp 98.6°F | Resp 17 | Wt 165.6 lb

## 2013-08-10 DIAGNOSIS — M62830 Muscle spasm of back: Secondary | ICD-10-CM

## 2013-08-10 DIAGNOSIS — M25519 Pain in unspecified shoulder: Secondary | ICD-10-CM | POA: Insufficient documentation

## 2013-08-10 DIAGNOSIS — Z139 Encounter for screening, unspecified: Secondary | ICD-10-CM

## 2013-08-10 DIAGNOSIS — I10 Essential (primary) hypertension: Secondary | ICD-10-CM | POA: Insufficient documentation

## 2013-08-10 DIAGNOSIS — M25512 Pain in left shoulder: Secondary | ICD-10-CM

## 2013-08-10 DIAGNOSIS — F17200 Nicotine dependence, unspecified, uncomplicated: Secondary | ICD-10-CM

## 2013-08-10 DIAGNOSIS — M5432 Sciatica, left side: Secondary | ICD-10-CM

## 2013-08-10 DIAGNOSIS — G8929 Other chronic pain: Secondary | ICD-10-CM | POA: Insufficient documentation

## 2013-08-10 DIAGNOSIS — Z720 Tobacco use: Secondary | ICD-10-CM | POA: Insufficient documentation

## 2013-08-10 DIAGNOSIS — M545 Low back pain, unspecified: Secondary | ICD-10-CM

## 2013-08-10 DIAGNOSIS — M5430 Sciatica, unspecified side: Secondary | ICD-10-CM | POA: Insufficient documentation

## 2013-08-10 DIAGNOSIS — M5380 Other specified dorsopathies, site unspecified: Secondary | ICD-10-CM

## 2013-08-10 LAB — CBC WITH DIFFERENTIAL/PLATELET
BASOS ABS: 0 10*3/uL (ref 0.0–0.1)
Basophils Relative: 1 % (ref 0–1)
EOS PCT: 3 % (ref 0–5)
Eosinophils Absolute: 0.2 10*3/uL (ref 0.0–0.7)
HCT: 47.4 % (ref 39.0–52.0)
Hemoglobin: 16.2 g/dL (ref 13.0–17.0)
LYMPHS PCT: 36 % (ref 12–46)
Lymphs Abs: 2.3 10*3/uL (ref 0.7–4.0)
MCH: 29.8 pg (ref 26.0–34.0)
MCHC: 34.2 g/dL (ref 30.0–36.0)
MCV: 87.1 fL (ref 78.0–100.0)
Monocytes Absolute: 0.5 10*3/uL (ref 0.1–1.0)
Monocytes Relative: 8 % (ref 3–12)
NEUTROS ABS: 3.3 10*3/uL (ref 1.7–7.7)
Neutrophils Relative %: 52 % (ref 43–77)
PLATELETS: 294 10*3/uL (ref 150–400)
RBC: 5.44 MIL/uL (ref 4.22–5.81)
RDW: 14.6 % (ref 11.5–15.5)
WBC: 6.3 10*3/uL (ref 4.0–10.5)

## 2013-08-10 LAB — LIPID PANEL
CHOL/HDL RATIO: 3.2 ratio
Cholesterol: 180 mg/dL (ref 0–200)
HDL: 56 mg/dL (ref >39)
LDL CALC: 112 mg/dL — AB (ref 0–99)
Triglycerides: 59 mg/dL (ref <150)
VLDL: 12 mg/dL (ref 0–40)

## 2013-08-10 LAB — COMPLETE METABOLIC PANEL WITH GFR
ALK PHOS: 90 U/L (ref 39–117)
ALT: 11 U/L (ref 0–53)
AST: 16 U/L (ref 0–37)
Albumin: 4.1 g/dL (ref 3.5–5.2)
BUN: 11 mg/dL (ref 6–23)
CO2: 26 meq/L (ref 19–32)
CREATININE: 0.96 mg/dL (ref 0.50–1.35)
Calcium: 9.6 mg/dL (ref 8.4–10.5)
Chloride: 103 mEq/L (ref 96–112)
GFR, Est African American: >89 mL/min
GFR, Est Non African American: >89 mL/min
Glucose, Bld: 96 mg/dL (ref 70–99)
Potassium: 4.8 mEq/L (ref 3.5–5.3)
Sodium: 143 mEq/L (ref 135–145)
TOTAL PROTEIN: 6.9 g/dL (ref 6.0–8.3)
Total Bilirubin: 0.4 mg/dL (ref 0.3–1.2)

## 2013-08-10 MED ORDER — AMLODIPINE BESYLATE 2.5 MG PO TABS: 2.5000 mg | ORAL_TABLET | Freq: Every day | ORAL | Status: DC

## 2013-08-10 MED ORDER — NICOTINE 21 MG/24HR TD PT24: 21.0000 mg | MEDICATED_PATCH | Freq: Every day | TRANSDERMAL | Status: DC

## 2013-08-10 MED ORDER — CYCLOBENZAPRINE HCL 10 MG PO TABS: 10.0000 mg | ORAL_TABLET | Freq: Three times a day (TID) | ORAL | Status: DC | PRN

## 2013-08-10 MED ORDER — NAPROXEN 500 MG PO TABS: 500.0000 mg | ORAL_TABLET | Freq: Two times a day (BID) | ORAL | Status: DC

## 2013-08-10 NOTE — Progress Notes (Signed)
Patient Demographics  Calvin [name], is a 53 y.o. male  [id number]  EY:[id number]  DOB - [birth year]  CC:  Chief Complaint  Patient presents with  . pinched nerve       HPI: Calvin [name] is a 53 y.o. male here today to establish medical care. He has been to emergency room and urgent care for low back pain radiating down to the legs especially on the left side with some numbness denies any urinary or stool incontinence denies any fever chills, his blood pressure is elevated denies any headache dizziness chest pain shortness of breath, patient does smoke cigarettes everyday advised to quit smoking he is going to try nicotine patch, he was advised to follow with orthopedics has not made appointment yet because of financial issues. Patient is going to apply for orange card, patient still reports pain and was taking Norco, he also has chronic left shoulder pain. Patient has No headache, No chest pain, No abdominal pain - No Nausea, No new weakness tingling or numbness, No Cough - SOB.  No Known Allergies History reviewed. No pertinent past medical history. Current Outpatient Prescriptions on File Prior to Visit  Medication Sig Dispense Refill  . acetaminophen (TYLENOL) 500 MG tablet Take 1,000 mg by mouth every 6 (six) hours as needed.      Marland Kitchen HYDROcodone-acetaminophen (NORCO/VICODIN) 5-325 MG per tablet Take 1-2 tablets by mouth every 6 (six) hours as needed for severe pain.  8 tablet  0  . HYDROcodone-acetaminophen (NORCO/VICODIN) 5-325 MG per tablet Take 1 tablet by mouth every 6 (six) hours as needed.  15 tablet  0  . Multiple Vitamins-Minerals (MULTIVITAMIN PO) Take 1 tablet by mouth daily.      . predniSONE (DELTASONE) 20 MG tablet Take 3 tablets (60 mg total) by mouth daily.  15 tablet  0  . sodium chloride (OCEAN) 0.65 % SOLN nasal spray Place 1 spray into both nostrils as needed for congestion.       No current facility-administered medications on file prior to visit.   Family History   Problem Relation Age of Onset  . Diabetes Mother   . Hypertension Mother   . Heart disease Mother   . Diabetes Brother   . Diabetes Daughter    History   Social History  . Marital Status: Single    Spouse Name: N/A    Number of Children: N/A  . Years of Education: N/A   Occupational History  . Not on file.   Social History Main Topics  . Smoking status: Current Every Day Smoker -- 0.50 packs/day for 20 years    Types: Cigarettes  . Smokeless tobacco: Not on file  . Alcohol Use: Yes     Comment: occasional  . Drug Use: No  . Sexual Activity: Not on file   Other Topics Concern  . Not on file   Social History Narrative  . No narrative on file    Review of Systems: Constitutional: Negative for fever, chills, diaphoresis, activity change, appetite change and fatigue. HENT: Negative for ear pain, nosebleeds, congestion, facial swelling, rhinorrhea, neck pain, neck stiffness and ear discharge.  Eyes: Negative for pain, discharge, redness, itching and visual disturbance. Respiratory: Negative for cough, choking, chest tightness, shortness of breath, wheezing and stridor.  Cardiovascular: Negative for chest pain, palpitations and leg swelling. Gastrointestinal: Negative for abdominal distention. Genitourinary: Negative for dysuria, urgency, frequency, hematuria, flank pain, decreased urine volume, difficulty urinating and dyspareunia.  Musculoskeletal: Positive for back pain, left shoulder pain,  joint swelling, arthralgia and gait problem. Neurological: Negative for dizziness, tremors, seizures, syncope, facial asymmetry, speech difficulty, weakness, light-headedness, numbness and headaches.  Hematological: Negative for adenopathy. Does not bruise/bleed easily. Psychiatric/Behavioral: Negative for hallucinations, behavioral problems, confusion, dysphoric mood, decreased concentration and agitation.    Objective:   Filed Vitals:   08/10/13 1200  BP: 160/100  Pulse:    Temp:   Resp:     Physical Exam: Constitutional: Patient appears well-developed and well-nourished. No distress. HENT: Normocephalic, atraumatic, External right and left ear normal. Oropharynx is clear and moist.  Eyes: Conjunctivae and EOM are normal. PERRLA, no scleral icterus. Neck: Normal ROM. Neck supple. No JVD. No tracheal deviation. No thyromegaly. CVS: RRR, S1/S2 +, no murmurs, no gallops, no carotid bruit.  Pulmonary: Effort and breath sounds normal, no stridor, rhonchi, wheezes, rales.  Abdominal: Soft. BS +, no distension, tenderness, rebound or guarding.  Musculoskeletal: Low lumbar spinal and paraspinal tenderness, with SLR test patient complaining of back discomfort but denies any shooting pain down to the leg right now  Neuro: Alert. Normal reflexes, muscle tone coordination. No cranial nerve deficit. Skin: Skin is warm and dry. No rash noted. Not diaphoretic. No erythema. No pallor. Psychiatric: Normal mood and affect. Behavior, judgment, thought content normal.  Lab Results  Component Value Date   WBC 6.2 01/22/2010   HGB 15.9 01/22/2010   HCT 46.5 01/22/2010   MCV 91.0 01/22/2010   PLT 190 01/22/2010   Lab Results  Component Value Date   CREATININE 1.04 01/22/2010   BUN 13 01/22/2010   NA 141 01/22/2010   K 4.0 01/22/2010   CL 107 01/22/2010   CO2 26 01/22/2010    No results found for this basename: HGBA1C   Lipid Panel     Component Value Date/Time   CHOL 150 08/13/2009 2010   TRIG 58 08/13/2009 2010   HDL 54 08/13/2009 2010   CHOLHDL 2.8 Ratio 08/13/2009 2010   VLDL 12 08/13/2009 2010   [place] 84 08/13/2009 2010       Assessment and plan:   1. Sciatica of left side  - Ambulatory referral to Orthopedic Surgery  2. Left shoulder pain  - Ambulatory referral to Orthopedic Surgery  3. Chronic low back pain I prescribed naproxen and muscle relaxant.  - naproxen (NAPROSYN) 500 MG tablet; Take 1 tablet (500 mg total) by mouth 2 (two) times daily with a  meal.  Dispense: 30 tablet; Refill: 2 - Ambulatory referral to Orthopedic Surgery  4. Back muscle spasm  - cyclobenzaprine (FLEXERIL) 10 MG tablet; Take 1 tablet (10 mg total) by mouth 3 (three) times daily as needed for muscle spasms.  Dispense: 30 tablet; Refill: 0  5. Essential hypertension, benign  Advise for low salt diet I started patient on low-dose Norvasc 2.5 mg daily - COMPLETE METABOLIC PANEL WITH GFR - amLODipine (NORVASC) 2.5 MG tablet; Take 1 tablet (2.5 mg total) by mouth daily.  Dispense: 90 tablet; Refill: 3  6. Smoking  - nicotine (NICODERM CQ) 21 mg/24hr patch; Place 1 patch (21 mg total) onto the skin daily.  Dispense: 28 patch; Refill: 0  7. Screening  - CBC with Differential - COMPLETE METABOLIC PANEL WITH GFR - TSH - Lipid panel - Vit D  25 hydroxy (rtn osteoporosis monitoring)   Return in about 6 weeks (around 09/21/2013).    The patient was given clear instructions to go to ER or return to medical center if symptoms don't improve, worsen or new problems develop.  The patient verbalized understanding. The patient was told to call to get lab results if they haven't heard anything in the next week.     Lorayne Marek, MD

## 2013-08-10 NOTE — Progress Notes (Signed)
Patient states was diagnosed with a pinched nerve Has back pain and pain down both legs

## 2013-08-10 NOTE — Patient Instructions (Signed)
2 Gram Low Sodium Diet A 2 gram sodium diet restricts the amount of sodium in the diet to no more than 2 g or 2000 mg daily. Limiting the amount of sodium is often used to help lower blood pressure. It is important if you have heart, liver, or kidney problems. Many foods contain sodium for flavor and sometimes as a preservative. When the amount of sodium in a diet needs to be low, it is important to know what to look for when choosing foods and drinks. The following includes some information and guidelines to help make it easier for you to adapt to a low sodium diet. QUICK TIPS  Do not add salt to food.  Avoid convenience items and fast food.  Choose unsalted snack foods.  Buy lower sodium products, often labeled as "lower sodium" or "no salt added."  Check food labels to learn how much sodium is in 1 serving.  When eating at a restaurant, ask that your food be prepared with less salt or none, if possible. READING FOOD LABELS FOR SODIUM INFORMATION The nutrition facts label is a good place to find how much sodium is in foods. Look for products with no more than 500 to 600 mg of sodium per meal and no more than 150 mg per serving. Remember that 2 g = 2000 mg. The food label may also list foods as:  Sodium-free: Less than 5 mg in a serving.  Very low sodium: 35 mg or less in a serving.  Low-sodium: 140 mg or less in a serving.  Light in sodium: 50% less sodium in a serving. For example, if a food that usually has 300 mg of sodium is changed to become light in sodium, it will have 150 mg of sodium.  Reduced sodium: 25% less sodium in a serving. For example, if a food that usually has 400 mg of sodium is changed to reduced sodium, it will have 300 mg of sodium. CHOOSING FOODS Grains  Avoid: Salted crackers and snack items. Some cereals, including instant hot cereals. Bread stuffing and biscuit mixes. Seasoned rice or pasta mixes.  Choose: Unsalted snack items. Low-sodium cereals, oats,  puffed wheat and rice, shredded wheat. English muffins and bread. Pasta. Meats  Avoid: Salted, canned, smoked, spiced, pickled meats, including fish and poultry. Bacon, ham, sausage, cold cuts, hot dogs, anchovies.  Choose: Low-sodium canned tuna and salmon. Fresh or frozen meat, poultry, and fish. Dairy  Avoid: Processed cheese and spreads. Cottage cheese. Buttermilk and condensed milk. Regular cheese.  Choose: Milk. Low-sodium cottage cheese. Yogurt. Sour cream. Low-sodium cheese. Fruits and Vegetables  Avoid: Regular canned vegetables. Regular canned tomato sauce and paste. Frozen vegetables in sauces. Olives. Pickles. Relishes. Sauerkraut.  Choose: Low-sodium canned vegetables. Low-sodium tomato sauce and paste. Frozen or fresh vegetables. Fresh and frozen fruit. Condiments  Avoid: Canned and packaged gravies. Worcestershire sauce. Tartar sauce. Barbecue sauce. Soy sauce. Steak sauce. Ketchup. Onion, garlic, and table salt. Meat flavorings and tenderizers.  Choose: Fresh and dried herbs and spices. Low-sodium varieties of mustard and ketchup. Lemon juice. Tabasco sauce. Horseradish. SAMPLE 2 GRAM SODIUM MEAL PLAN Breakfast / Sodium (mg)  1 cup low-fat milk / 143 mg  2 slices whole-wheat toast / 270 mg  1 tbs heart-healthy margarine / 153 mg  1 hard-boiled egg / 139 mg  1 small orange / 0 mg Lunch / Sodium (mg)  1 cup raw carrots / 76 mg   cup hummus / 298 mg  1 cup low-fat milk /   143 mg   cup red grapes / 2 mg  1 whole-wheat pita bread / 356 mg Dinner / Sodium (mg)  1 cup whole-wheat pasta / 2 mg  1 cup low-sodium tomato sauce / 73 mg  3 oz lean ground beef / 57 mg  1 small side salad (1 cup raw spinach leaves,  cup cucumber,  cup yellow bell pepper) with 1 tsp olive oil and 1 tsp red wine vinegar / 25 mg Snack / Sodium (mg)  1 container low-fat vanilla yogurt / 107 mg  3 graham cracker squares / 127 mg Nutrient Analysis  Calories: 2033  Protein:  77 g  Carbohydrate: 282 g  Fat: 72 g  Sodium: 1971 mg Document Released: 07/12/2005 Document Revised: 10/04/2011 Document Reviewed: 10/13/2009 [employer] Patient Information 2014 [employer].  

## 2013-08-11 LAB — TSH: TSH: 0.676 u[IU]/mL (ref 0.350–4.500)

## 2013-08-11 LAB — VITAMIN D 25 HYDROXY (VIT D DEFICIENCY, FRACTURES): Vit D, 25-Hydroxy: 24 ng/mL — ABNORMAL LOW (ref 30–89)

## 2013-08-13 ENCOUNTER — Telehealth: Payer: Self-pay

## 2013-08-13 NOTE — Telephone Encounter (Signed)
Patient is aware of his lab results 

## 2013-08-13 NOTE — Telephone Encounter (Signed)
Message copied by Dorothe Pea on Mon Aug 13, 2013 12:17 PM ------      Message from: Lorayne Marek      Created: Mon Aug 13, 2013 10:23 AM       Blood work reviewed, noticed low vitamin D, advise patient take vitamin D over-the-counter 2000 units daily. ------

## 2013-08-17 ENCOUNTER — Ambulatory Visit: Payer: No Typology Code available for payment source | Attending: Internal Medicine

## 2013-08-27 ENCOUNTER — Encounter: Payer: Self-pay | Admitting: Family Medicine

## 2013-08-27 ENCOUNTER — Ambulatory Visit (INDEPENDENT_AMBULATORY_CARE_PROVIDER_SITE_OTHER): Payer: No Typology Code available for payment source | Admitting: Family Medicine

## 2013-08-27 ENCOUNTER — Ambulatory Visit (HOSPITAL_BASED_OUTPATIENT_CLINIC_OR_DEPARTMENT_OTHER)
Admission: RE | Admit: 2013-08-27 | Discharge: 2013-08-27 | Disposition: A | Discharge status: Home / Self Care | Payer: No Typology Code available for payment source | Source: Ambulatory Visit | Attending: Family Medicine | Admitting: Family Medicine

## 2013-08-27 VITALS — BP 185/117 | HR 69 | Ht 73.0 in | Wt 170.0 lb

## 2013-08-27 DIAGNOSIS — M25512 Pain in left shoulder: Secondary | ICD-10-CM

## 2013-08-27 DIAGNOSIS — M25559 Pain in unspecified hip: Secondary | ICD-10-CM

## 2013-08-27 DIAGNOSIS — M545 Low back pain, unspecified: Secondary | ICD-10-CM

## 2013-08-27 DIAGNOSIS — M25551 Pain in right hip: Secondary | ICD-10-CM

## 2013-08-27 DIAGNOSIS — G8929 Other chronic pain: Secondary | ICD-10-CM

## 2013-08-27 DIAGNOSIS — M62830 Muscle spasm of back: Secondary | ICD-10-CM

## 2013-08-27 DIAGNOSIS — M25519 Pain in unspecified shoulder: Secondary | ICD-10-CM

## 2013-08-27 DIAGNOSIS — M5380 Other specified dorsopathies, site unspecified: Secondary | ICD-10-CM

## 2013-08-27 MED ORDER — CYCLOBENZAPRINE HCL 10 MG PO TABS: 10.0000 mg | ORAL_TABLET | Freq: Three times a day (TID) | ORAL | Status: DC | PRN

## 2013-08-27 MED ORDER — DICLOFENAC SODIUM 75 MG PO TBEC: 75.0000 mg | DELAYED_RELEASE_TABLET | Freq: Two times a day (BID) | ORAL | Status: DC

## 2013-08-27 NOTE — Patient Instructions (Signed)
Get x-rays of your right hip before you leave today. Start physical therapy with protocols for cervical and lumbar radiculopathy (pinched nerves). Take tylenol for baseline pain relief (1-2 extra strength tabs 3x/day) Voltaren twice a day with food for pain and inflammation. Flexeril as needed for muscle spasms (no driving on this medicine if it makes you sleepy). Stay as active as possible. Strengthening of low back muscles, abdominal musculature are key for long term pain relief. If not improving, will consider further imaging (MRI). Follow up in 1 month to 6 weeks.  General arthritis instructions: Take tylenol 500mg  1-2 tabs three times a day for pain. Voltaren twice a day with food for pain and inflammation. Glucosamine sulfate 750mg  twice a day is a supplement that may help. Capsaicin topically up to four times a day may also help with pain. Cortisone injections are an option. It's important that you continue to stay active. Physical therapy to strengthen muscles around the joint that hurts to take pressure off of the joint itself. Heat or ice 15 minutes at a time 3-4 times a day as needed to help with pain. Water aerobics and cycling with low resistance are the best two types of exercise for arthritis.

## 2013-08-29 ENCOUNTER — Encounter: Payer: Self-pay | Admitting: Family Medicine

## 2013-08-29 NOTE — Assessment & Plan Note (Signed)
his history and exam do not fit with any specific pathology.  Distribution throughout left arm with subjective numbness and weakness in 3 separate muscle groups/tested dermatomes but not all muscle groups of left arm.  Start physical therapy, tylenol, voltaren, flexeril.  Could consider cervical MRI if not improving.

## 2013-08-29 NOTE — Assessment & Plan Note (Signed)
Also does not fit with any specific pathology.  Describes pain that started in right groin and radiated into both legs.  Exam benign.  No red flags.  Would treat with radiculopathy protocol/spinal stenosis.  Start physical therapy, voltaren, flexeril.  Consider MRI if not improving.  Hip radiographs negative.

## 2013-08-29 NOTE — Progress Notes (Signed)
Patient ID: Calvin [name], male   DOB: Jun 05, 1961, 53 y.o.   MRN: [id number]  PCP: Angelica Chessman, MD  Subjective:   HPI: Patient is a 53 y.o. male here for left shoulder, right hip pain.  1. Left shoulder pain Patient reports for about 1 year he's had posterior to anterior left shoulder pain. Had tingling prior to this into the left arm as well. Tried prednisone, muscle relaxant, pain medication without benefit. Feels more weak than right side. No injuries or trauma  2. Right hip pain  Started about 4 months ago and progressed. Describes a warm feeling into right hip and leg. Started with right groin popping. Radiates posteriorly and down both of his legs now. Told he has sciatica in the past. No bowel/bladder dysfunction.  History reviewed. No pertinent past medical history.  Current Outpatient Prescriptions on File Prior to Visit  Medication Sig Dispense Refill  . acetaminophen (TYLENOL) 500 MG tablet Take 1,000 mg by mouth every 6 (six) hours as needed.      Marland Kitchen amLODipine (NORVASC) 2.5 MG tablet Take 1 tablet (2.5 mg total) by mouth daily.  90 tablet  3  . Multiple Vitamins-Minerals (MULTIVITAMIN PO) Take 1 tablet by mouth daily.      . nicotine (NICODERM CQ) 21 mg/24hr patch Place 1 patch (21 mg total) onto the skin daily.  28 patch  0  . sodium chloride (OCEAN) 0.65 % SOLN nasal spray Place 1 spray into both nostrils as needed for congestion.       No current facility-administered medications on file prior to visit.    Past Surgical History  Procedure Laterality Date  . Foot surgery      right, ~ 2002    No Known Allergies  History   Social History  . Marital Status: Single    Spouse Name: N/A    Number of Children: N/A  . Years of Education: N/A   Occupational History  . Not on file.   Social History Main Topics  . Smoking status: Current Every Day Smoker -- 0.50 packs/day for 20 years    Types: Cigarettes  . Smokeless tobacco: Not on file  . Alcohol  Use: Yes     Comment: occasional  . Drug Use: No  . Sexual Activity: Not on file   Other Topics Concern  . Not on file   Social History Narrative  . No narrative on file    Family History  Problem Relation Age of Onset  . Diabetes Mother   . Hypertension Mother   . Heart disease Mother   . Hyperlipidemia Mother   . Diabetes Brother   . Diabetes Daughter   . Hypertension Sister   . Diabetes Sister     BP 185/117  Pulse 69  Ht 6\' 1"  (1.854 m)  Wt 170 lb (77.111 kg)  BMI 22.43 kg/m2  Review of Systems: See HPI above.    Objective:  Physical Exam:  Gen: NAD  Neck: No gross deformity, swelling, bruising. TTP mildly left trapezius.  No midline/bony TTP. FROM neck without pain. BUE strength 5/5 except 4/5 with left sided finger abduction, elbow flexion and extension. Sensation diminished to light touch throughout left arm compared to right. 2+ equal reflexes in triceps, biceps, brachioradialis tendons. Negative spurlings.  Left shoulder: No swelling, ecchymoses.  No gross deformity. No TTP except trapezius noted above. FROM. Negative Hawkins, Neers (posterior pain, not in shoulder). Negative Speeds, Yergasons. Strength 5/5 with empty can and resisted internal/external rotation. Negative  apprehension. NV intact distally.    Back: No gross deformity, scoliosis. TTP mildly bilateral paraspinal lumbar regions.  No midline or bony TTP. FROM. Strength LEs 5/5 all muscle groups.   2+ MSRs in patellar and achilles tendons, equal bilaterally. Negative SLRs. Sensation intact to light touch bilaterally. Negative logroll bilateral hips Negative fabers, piriformis stretches.  Assessment & Plan:  1. Left shoulder/neck pain - his history and exam do not fit with any specific pathology.  Distribution throughout left arm with subjective numbness and weakness in 3 separate muscle groups/tested dermatomes but not all muscle groups of left arm.  Start physical therapy,  tylenol, voltaren, flexeril.  Could consider cervical MRI if not improving.  2. Bilateral leg/low back pain - Also does not fit with any specific pathology.  Describes pain that started in right groin and radiated into both legs.  Exam benign.  No red flags.  Would treat with radiculopathy protocol/spinal stenosis.  Start physical therapy, voltaren, flexeril.  Consider MRI if not improving.  Hip radiographs negative.

## 2013-09-03 ENCOUNTER — Ambulatory Visit: Payer: Self-pay | Admitting: Family Medicine

## 2013-09-14 ENCOUNTER — Encounter (HOSPITAL_COMMUNITY): Payer: Self-pay | Admitting: Emergency Medicine

## 2013-09-14 ENCOUNTER — Emergency Department (HOSPITAL_COMMUNITY)
Admission: EM | Admit: 2013-09-14 | Discharge: 2013-09-14 | Discharge status: Against Medical Advice | Payer: No Typology Code available for payment source | Attending: Emergency Medicine | Admitting: Emergency Medicine

## 2013-09-14 DIAGNOSIS — K625 Hemorrhage of anus and rectum: Secondary | ICD-10-CM | POA: Insufficient documentation

## 2013-09-14 HISTORY — DX: Essential (primary) hypertension: I10

## 2013-09-14 NOTE — ED Notes (Signed)
Called for pt x 1 with no answer

## 2013-09-14 NOTE — ED Notes (Signed)
Pt did not answer x 3 

## 2013-09-14 NOTE — ED Notes (Addendum)
Pt reports having blood in his stool ever so often off and on over the past couple of months. Noticed it today which was more pronounced than usual. Blood was bright red. Reports a burning sensation in his rectum mainly when using the bathroom.

## 2013-09-14 NOTE — ED Notes (Signed)
Unable to locate pt x2

## 2013-09-16 ENCOUNTER — Emergency Department (HOSPITAL_COMMUNITY)
Admission: EM | Admit: 2013-09-16 | Discharge: 2013-09-16 | Disposition: A | Discharge status: Home / Self Care | Payer: No Typology Code available for payment source | Attending: Emergency Medicine | Admitting: Emergency Medicine

## 2013-09-16 ENCOUNTER — Encounter (HOSPITAL_COMMUNITY): Payer: Self-pay | Admitting: Emergency Medicine

## 2013-09-16 DIAGNOSIS — F17200 Nicotine dependence, unspecified, uncomplicated: Secondary | ICD-10-CM | POA: Insufficient documentation

## 2013-09-16 DIAGNOSIS — Z79899 Other long term (current) drug therapy: Secondary | ICD-10-CM | POA: Insufficient documentation

## 2013-09-16 DIAGNOSIS — K625 Hemorrhage of anus and rectum: Secondary | ICD-10-CM | POA: Insufficient documentation

## 2013-09-16 DIAGNOSIS — I10 Essential (primary) hypertension: Secondary | ICD-10-CM | POA: Insufficient documentation

## 2013-09-16 LAB — CBC
HCT: 44.2 % (ref 39.0–52.0)
Hemoglobin: 15.8 g/dL (ref 13.0–17.0)
MCH: 30.4 pg (ref 26.0–34.0)
MCHC: 35.7 g/dL (ref 30.0–36.0)
MCV: 85.2 fL (ref 78.0–100.0)
Platelets: 226 10*3/uL (ref 150–400)
RBC: 5.19 MIL/uL (ref 4.22–5.81)
RDW: 13.6 % (ref 11.5–15.5)
WBC: 6.2 10*3/uL (ref 4.0–10.5)

## 2013-09-16 LAB — COMPREHENSIVE METABOLIC PANEL
ALBUMIN: 4 g/dL (ref 3.5–5.2)
ALK PHOS: 106 U/L (ref 39–117)
ALT: 20 U/L (ref 0–53)
AST: 23 U/L (ref 0–37)
BUN: 12 mg/dL (ref 6–23)
CO2: 23 mEq/L (ref 19–32)
Calcium: 9.4 mg/dL (ref 8.4–10.5)
Chloride: 101 mEq/L (ref 96–112)
Creatinine, Ser: 0.93 mg/dL (ref 0.50–1.35)
GFR calc Af Amer: >90 mL/min (ref >90)
GFR calc non Af Amer: >90 mL/min (ref >90)
Glucose, Bld: 104 mg/dL — ABNORMAL HIGH (ref 70–99)
POTASSIUM: 4.5 meq/L (ref 3.7–5.3)
Sodium: 138 mEq/L (ref 137–147)
Total Bilirubin: 0.4 mg/dL (ref 0.3–1.2)
Total Protein: 7.8 g/dL (ref 6.0–8.3)

## 2013-09-16 LAB — POC OCCULT BLOOD, ED: FECAL OCCULT BLD: NEGATIVE

## 2013-09-16 MED ORDER — POLYETHYLENE GLYCOL 3350 17 G PO PACK: 17.0000 g | PACK | Freq: Every day | ORAL | Status: DC

## 2013-09-16 NOTE — ED Provider Notes (Signed)
CSN: [id number]     Arrival date & time 09/16/13  1607 History   First MD Initiated Contact with Patient 09/16/13 1705     Chief Complaint  Patient presents with  . Rectal Bleeding     (Consider location/radiation/quality/duration/timing/severity/associated sxs/prior Treatment) HPI  53 year old male with history of hypertension presents complaining of rectal bleeding. Patient reports for the past several months he has had intermittent rectal bleeding. He described as trace of blood on toilet paper when he wipes. This does not happen every time. He reports 1-2 bouts of rectal bleeding per month. Usually it is not painful. His last bowel movement was several days ago there was some mild burning sensation and mild rectal bleeding on the tissue paper. States he usually have bowel-movement twice a week. Denies fever, chills, lightheadedness, dizziness, abnormal weight changes, myalgias, night sweats, abdominal pain, rectal pain or rectal itch. Has not had a colonoscopy. Does report having occasional constipation. No family history of colon cancer. Denies having any chest pain or shortness of breath, no hemoptysis, hematemesis, or melena.   Past Medical History  Diagnosis Date  . Hypertension    Past Surgical History  Procedure Laterality Date  . Foot surgery      right, ~ 2002   Family History  Problem Relation Age of Onset  . Diabetes Mother   . Hypertension Mother   . Heart disease Mother   . Hyperlipidemia Mother   . Diabetes Brother   . Diabetes Daughter   . Hypertension Sister   . Diabetes Sister    History  Substance Use Topics  . Smoking status: Current Every Day Smoker -- 0.50 packs/day for 20 years    Types: Cigarettes  . Smokeless tobacco: Not on file  . Alcohol Use: Yes     Comment: occasional    Review of Systems  All other systems reviewed and are negative.      Allergies  Review of patient's allergies indicates no known allergies.  Home Medications    Current Outpatient Rx  Name  Route  Sig  Dispense  Refill  . acetaminophen (TYLENOL) 500 MG tablet   Oral   Take 1,000 mg by mouth every 6 (six) hours as needed.         Marland Kitchen amLODipine (NORVASC) 2.5 MG tablet   Oral   Take 1 tablet (2.5 mg total) by mouth daily.   90 tablet   3   . cyclobenzaprine (FLEXERIL) 10 MG tablet   Oral   Take 1 tablet (10 mg total) by mouth 3 (three) times daily as needed for muscle spasms.   90 tablet   0   . diclofenac (VOLTAREN) 75 MG EC tablet   Oral   Take 1 tablet (75 mg total) by mouth 2 (two) times daily.   60 tablet   2   . Multiple Vitamins-Minerals (MULTIVITAMIN PO)   Oral   Take 1 tablet by mouth daily.         . nicotine (NICODERM CQ) 21 mg/24hr patch   Transdermal   Place 1 patch (21 mg total) onto the skin daily.   28 patch   0   . sodium chloride (OCEAN) 0.65 % SOLN nasal spray   Each Nare   Place 1 spray into both nostrils as needed for congestion.          BP 136/105  Pulse 107  Temp(Src) 98.9 F (37.2 C) (Oral)  Resp 17  Wt 170 lb (77.111 kg)  SpO2  95% Physical Exam  Constitutional: He is oriented to person, place, and time. He appears well-developed and well-nourished. No distress.  HENT:  Head: Atraumatic.  Eyes: Conjunctivae are normal.  Neck: Normal range of motion. Neck supple.  Cardiovascular: Normal rate and regular rhythm.   Pulmonary/Chest: Effort normal and breath sounds normal.  Abdominal: There is no tenderness.  Genitourinary:  Normal rectal tone, no mass, no frank bleeding, no external hemorrhoid, no anal fissure, no pain with rectal exam.  Hemoccult negative  Neurological: He is alert and oriented to person, place, and time.  Skin: No rash noted.  Psychiatric: He has a normal mood and affect.    ED Course  Procedures (including critical care time)  5:36 PM Patient reports intermittent bouts of rectal bleeding only noticed on when he wiped. No active bleeding at this time. He is  hemodynamically stable. He will need to have a colonoscopy performed outpatient. Will give GI referral.  Care discussed with Dr. Wyvonnia Dusky.  Labs Review Labs Reviewed  COMPREHENSIVE METABOLIC PANEL - Abnormal; Notable for the following:    Glucose, Bld 104 (*)    All other components within normal limits  CBC  POC OCCULT BLOOD, ED   Imaging Review No results found.  EKG Interpretation   None       MDM   Final diagnoses:  Rectal bleed    BP 151/105  Pulse 97  Temp(Src) 98.9 F (37.2 C) (Oral)  Resp 19  Wt 170 lb (77.111 kg)  SpO2 95%      Domenic Moras, PA-C 09/16/13 1757

## 2013-09-16 NOTE — Discharge Instructions (Signed)
Please follow up with GI specialist for further evaluation of your rectal bleeding.  You may need a colonoscopy.  Take miralax to help with constipation.   Rectal Bleeding Rectal bleeding is when blood passes out of the anus. It is usually a sign that something is wrong. It may not be serious, but it should always be evaluated. Rectal bleeding may present as bright red blood or extremely dark stools. The color may range from dark red or maroon to black (like tar). It is important that the cause of rectal bleeding be identified so treatment can be started and the problem corrected. CAUSES   Hemorrhoids. These are enlarged (dilated) blood vessels or veins in the anal or rectal area.  Fistulas. Theseare abnormal, burrowing channels that usually run from inside the rectum to the skin around the anus. They can bleed.  Anal fissures. This is a tear in the tissue of the anus. Bleeding occurs with bowel movements.  Diverticulosis. This is a condition in which pockets or sacs project from the bowel wall. Occasionally, the sacs can bleed.  Diverticulitis. Thisis an infection involving diverticulosis of the colon.  Proctitis and colitis. These are conditions in which the rectum, colon, or both, can become inflamed and pitted (ulcerated).  Polyps and cancer. Polyps are non-cancerous (benign) growths in the colon that may bleed. Certain types of polyps turn into cancer.  Protrusion of the rectum. Part of the rectum can project from the anus and bleed.  Certain medicines.  Intestinal infections.  Blood vessel abnormalities. HOME CARE INSTRUCTIONS  Eat a high-fiber diet to keep your stool soft.  Limit activity.  Drink enough fluids to keep your urine clear or pale yellow.  Warm baths may be useful to soothe rectal pain.  Follow up with your caregiver as directed. SEEK IMMEDIATE MEDICAL CARE IF:  You develop increased bleeding.  You have black or dark red stools.  You vomit blood or  material that looks like coffee grounds.  You have abdominal pain or tenderness.  You have a fever.  You feel weak, nauseous, or you faint.  You have severe rectal pain or you are unable to have a bowel movement. MAKE SURE YOU:  Understand these instructions.  Will watch your condition.  Will get help right away if you are not doing well or get worse. Document Released: 01/01/2002 Document Revised: 10/04/2011 Document Reviewed: 12/27/2010 [facility] Patient Information 2014 [place], [place].

## 2013-09-16 NOTE — ED Provider Notes (Signed)
Medical screening examination/treatment/procedure(s) were performed by non-physician practitioner and as supervising physician I was immediately available for consultation/collaboration.  EKG Interpretation   None         Ezequiel Essex, MD 09/16/13 2342

## 2013-09-16 NOTE — ED Notes (Signed)
Pt c/o blood in stool ongoing for a couple of months. Pt describes stools with bright red blood, and feeling constipated.

## 2013-09-18 ENCOUNTER — Ambulatory Visit: Payer: No Typology Code available for payment source | Attending: Family Medicine | Admitting: Physical Therapy

## 2013-09-18 DIAGNOSIS — IMO0001 Reserved for inherently not codable concepts without codable children: Secondary | ICD-10-CM | POA: Insufficient documentation

## 2013-09-18 DIAGNOSIS — R262 Difficulty in walking, not elsewhere classified: Secondary | ICD-10-CM | POA: Insufficient documentation

## 2013-09-18 DIAGNOSIS — M545 Low back pain, unspecified: Secondary | ICD-10-CM | POA: Insufficient documentation

## 2013-09-18 DIAGNOSIS — R269 Unspecified abnormalities of gait and mobility: Secondary | ICD-10-CM | POA: Insufficient documentation

## 2013-09-18 DIAGNOSIS — M25559 Pain in unspecified hip: Secondary | ICD-10-CM | POA: Insufficient documentation

## 2013-09-18 DIAGNOSIS — M5380 Other specified dorsopathies, site unspecified: Secondary | ICD-10-CM | POA: Insufficient documentation

## 2013-09-21 ENCOUNTER — Ambulatory Visit: Payer: Self-pay

## 2013-09-24 ENCOUNTER — Ambulatory Visit: Payer: No Typology Code available for payment source | Attending: Internal Medicine | Admitting: Physical Therapy

## 2013-09-24 DIAGNOSIS — M545 Low back pain, unspecified: Secondary | ICD-10-CM | POA: Insufficient documentation

## 2013-09-24 DIAGNOSIS — IMO0001 Reserved for inherently not codable concepts without codable children: Secondary | ICD-10-CM | POA: Insufficient documentation

## 2013-09-24 DIAGNOSIS — R262 Difficulty in walking, not elsewhere classified: Secondary | ICD-10-CM | POA: Insufficient documentation

## 2013-09-24 DIAGNOSIS — R269 Unspecified abnormalities of gait and mobility: Secondary | ICD-10-CM | POA: Insufficient documentation

## 2013-09-24 DIAGNOSIS — M25559 Pain in unspecified hip: Secondary | ICD-10-CM | POA: Insufficient documentation

## 2013-09-24 DIAGNOSIS — M5380 Other specified dorsopathies, site unspecified: Secondary | ICD-10-CM | POA: Insufficient documentation

## 2013-09-26 ENCOUNTER — Ambulatory Visit: Payer: Self-pay | Admitting: Internal Medicine

## 2013-09-27 ENCOUNTER — Ambulatory Visit: Payer: No Typology Code available for payment source | Admitting: Rehabilitation

## 2013-10-01 ENCOUNTER — Ambulatory Visit: Payer: Self-pay | Admitting: Family Medicine

## 2013-10-01 ENCOUNTER — Ambulatory Visit: Payer: Self-pay

## 2013-10-02 ENCOUNTER — Encounter: Payer: No Typology Code available for payment source | Admitting: Rehabilitation

## 2013-10-03 ENCOUNTER — Ambulatory Visit: Payer: No Typology Code available for payment source | Admitting: Rehabilitation

## 2013-10-04 ENCOUNTER — Encounter: Payer: No Typology Code available for payment source | Admitting: Rehabilitation

## 2013-10-08 ENCOUNTER — Ambulatory Visit: Payer: No Typology Code available for payment source | Admitting: Physical Therapy

## 2013-10-10 ENCOUNTER — Ambulatory Visit: Payer: No Typology Code available for payment source | Admitting: Physical Therapy

## 2013-10-15 ENCOUNTER — Ambulatory Visit: Payer: No Typology Code available for payment source | Admitting: Rehabilitation

## 2013-10-22 ENCOUNTER — Encounter (HOSPITAL_COMMUNITY): Payer: Self-pay | Admitting: Emergency Medicine

## 2013-10-22 ENCOUNTER — Emergency Department (INDEPENDENT_AMBULATORY_CARE_PROVIDER_SITE_OTHER)
Admission: EM | Admit: 2013-10-22 | Discharge: 2013-10-22 | Disposition: A | Discharge status: Home / Self Care | Payer: No Typology Code available for payment source | Source: Home / Self Care | Attending: Emergency Medicine | Admitting: Emergency Medicine

## 2013-10-22 ENCOUNTER — Ambulatory Visit (INDEPENDENT_AMBULATORY_CARE_PROVIDER_SITE_OTHER): Payer: No Typology Code available for payment source | Admitting: Family Medicine

## 2013-10-22 ENCOUNTER — Encounter: Payer: Self-pay | Admitting: Family Medicine

## 2013-10-22 VITALS — BP 146/102 | HR 83 | Ht 73.0 in | Wt 165.0 lb

## 2013-10-22 DIAGNOSIS — M545 Low back pain, unspecified: Secondary | ICD-10-CM

## 2013-10-22 DIAGNOSIS — S39012A Strain of muscle, fascia and tendon of lower back, initial encounter: Secondary | ICD-10-CM

## 2013-10-22 DIAGNOSIS — G8929 Other chronic pain: Secondary | ICD-10-CM

## 2013-10-22 DIAGNOSIS — S335XXA Sprain of ligaments of lumbar spine, initial encounter: Secondary | ICD-10-CM

## 2013-10-22 DIAGNOSIS — M79604 Pain in right leg: Secondary | ICD-10-CM

## 2013-10-22 DIAGNOSIS — X58XXXA Exposure to other specified factors, initial encounter: Secondary | ICD-10-CM

## 2013-10-22 MED ORDER — PREDNISONE 10 MG PO TABS: ORAL_TABLET | ORAL | Status: DC

## 2013-10-22 MED ORDER — CYCLOBENZAPRINE HCL 10 MG PO TABS: 10.0000 mg | ORAL_TABLET | Freq: Two times a day (BID) | ORAL | Status: DC | PRN

## 2013-10-22 MED ORDER — HYDROCODONE-ACETAMINOPHEN 5-325 MG PO TABS: 2.0000 | ORAL_TABLET | ORAL | Status: DC | PRN

## 2013-10-22 NOTE — ED Notes (Signed)
Patient complains of lower back pain Stated broke both his heels falling off a ladder many years ago Does not recall injuring his back

## 2013-10-22 NOTE — Discharge Instructions (Signed)
Back Pain, Adult Low back pain is very common. About 1 in 5 people have back pain.The cause of low back pain is rarely dangerous. The pain often gets better over time.About half of people with a sudden onset of back pain feel better in just 2 weeks. About 8 in 10 people feel better by 6 weeks.  CAUSES Some common causes of back pain include:  Strain of the muscles or ligaments supporting the spine.  Wear and tear (degeneration) of the spinal discs.  Arthritis.  Direct injury to the back. DIAGNOSIS Most of the time, the direct cause of low back pain is not known.However, back pain can be treated effectively even when the exact cause of the pain is unknown.Answering your caregiver's questions about your overall health and symptoms is one of the most accurate ways to make sure the cause of your pain is not dangerous. If your caregiver needs more information, he or she may order lab work or imaging tests (X-rays or MRIs).However, even if imaging tests show changes in your back, this usually does not require surgery. HOME CARE INSTRUCTIONS For many people, back pain returns.Since low back pain is rarely dangerous, it is often a condition that people can learn to manageon their own.   Remain active. It is stressful on the back to sit or stand in one place. Do not sit, drive, or stand in one place for more than 30 minutes at a time. Take short walks on level surfaces as soon as pain allows.Try to increase the length of time you walk each day.  Do not stay in bed.Resting more than 1 or 2 days can delay your recovery.  Do not avoid exercise or work.Your body is made to move.It is not dangerous to be active, even though your back may hurt.Your back will likely heal faster if you return to being active before your pain is gone.  Pay attention to your body when you bend and lift. Many people have less discomfortwhen lifting if they bend their knees, keep the load close to their bodies,and  avoid twisting. Often, the most comfortable positions are those that put less stress on your recovering back.  Find a comfortable position to sleep. Use a firm mattress and lie on your side with your knees slightly bent. If you lie on your back, put a pillow under your knees.  Only take over-the-counter or prescription medicines as directed by your caregiver. Over-the-counter medicines to reduce pain and inflammation are often the most helpful.Your caregiver may prescribe muscle relaxant drugs.These medicines help dull your pain so you can more quickly return to your normal activities and healthy exercise.  Put ice on the injured area.  Put ice in a plastic bag.  Place a towel between your skin and the bag.  Leave the ice on for 15-20 minutes, 03-04 times a day for the first 2 to 3 days. After that, ice and heat may be alternated to reduce pain and spasms.  Ask your caregiver about trying back exercises and gentle massage. This may be of some benefit.  Avoid feeling anxious or stressed.Stress increases muscle tension and can worsen back pain.It is important to recognize when you are anxious or stressed and learn ways to manage it.Exercise is a great option. SEEK MEDICAL CARE IF:  You have pain that is not relieved with rest or medicine.  You have pain that does not improve in 1 week.  You have new symptoms.  You are generally not feeling well. SEEK   IMMEDIATE MEDICAL CARE IF:   You have pain that radiates from your back into your legs.  You develop new bowel or bladder control problems.  You have unusual weakness or numbness in your arms or legs.  You develop nausea or vomiting.  You develop abdominal pain.  You feel faint. Document Released: 07/12/2005 Document Revised: 01/11/2012 Document Reviewed: 11/30/2010 [employer] Patient Information 2014 [employer].  

## 2013-10-22 NOTE — ED Provider Notes (Signed)
Medical screening examination/treatment/procedure(s) were performed by non-physician practitioner and as supervising physician I was immediately available for consultation/collaboration.  Philipp Deputy, M.D.  Harden Mo, MD 10/22/13 2220

## 2013-10-22 NOTE — ED Provider Notes (Signed)
CSN: [id number]     Arrival date & time 10/22/13  1518 History   First MD Initiated Contact with Patient 10/22/13 1747     Chief Complaint  Patient presents with  . Back Pain   (Consider location/radiation/quality/duration/timing/severity/associated sxs/prior Treatment) Patient is a 53 y.o. male presenting with back pain. The history is provided by the patient. No language interpreter was used.  Back Pain Location:  Generalized Quality:  Aching Radiates to:  Does not radiate Pain severity:  No pain Onset quality:  Gradual Duration:  6 months Timing:  Constant Progression:  Worsening Chronicity:  New Relieved by:  Nothing Worsened by:  Nothing tried Ineffective treatments:  None tried Associated symptoms: numbness   Associated symptoms: no abdominal pain     Past Medical History  Diagnosis Date  . Hypertension    Past Surgical History  Procedure Laterality Date  . Foot surgery      right, ~ 2002   Family History  Problem Relation Age of Onset  . Diabetes Mother   . Hypertension Mother   . Heart disease Mother   . Hyperlipidemia Mother   . Diabetes Brother   . Diabetes Daughter   . Hypertension Sister   . Diabetes Sister    History  Substance Use Topics  . Smoking status: Current Every Day Smoker -- 0.50 packs/day for 20 years    Types: Cigarettes  . Smokeless tobacco: Not on file  . Alcohol Use: Yes     Comment: occasional    Review of Systems  Gastrointestinal: Negative for abdominal pain.  Musculoskeletal: Positive for back pain.  Neurological: Positive for numbness.  All other systems reviewed and are negative.    Allergies  Review of patient's allergies indicates no known allergies.  Home Medications   Current Outpatient Rx  Name  Route  Sig  Dispense  Refill  . amLODipine (NORVASC) 2.5 MG tablet   Oral   Take 2.5 mg by mouth daily.         . cyclobenzaprine (FLEXERIL) 10 MG tablet   Oral   Take 10 mg by mouth 3 (three) times daily as  needed for muscle spasms.         . diclofenac (VOLTAREN) 75 MG EC tablet   Oral   Take 75 mg by mouth 2 (two) times daily.         Marland Kitchen ibuprofen (ADVIL,MOTRIN) 200 MG tablet   Oral   Take 800 mg by mouth every 6 (six) hours as needed for mild pain.         . Multiple Vitamins-Minerals (MULTIVITAMIN PO)   Oral   Take 1 tablet by mouth daily.         . polyethylene glycol (MIRALAX / GLYCOLAX) packet   Oral   Take 17 g by mouth daily.   14 each   0    BP 150/100  Pulse 79  Temp(Src) 98.6 F (37 C) (Oral)  Resp 18  SpO2 98% Physical Exam  Nursing note and vitals reviewed. Constitutional: He is oriented to person, place, and time. He appears well-developed.  HENT:  Head: Normocephalic and atraumatic.  Eyes: Pupils are equal, round, and reactive to light.  Cardiovascular: Normal rate and normal heart sounds.   Pulmonary/Chest: Effort normal.  Abdominal: Soft.  Musculoskeletal:  Diffusely tender lumbar spine,  Decreased range of motion  Neurological: He is alert and oriented to person, place, and time.  Skin: Skin is warm.  Psychiatric: He has a normal mood  and affect.    ED Course  Procedures (including critical care time) Labs Review Labs Reviewed - No data to display Imaging Review No results found.   MDM   1. Lumbar strain    Pt sees Dr. Barbaraann Barthel has MRI scheduled on  Sunday.   Pt advised to follow up as scheduled    Fransico Meadow, PA-C 10/22/13 1811

## 2013-10-22 NOTE — Progress Notes (Addendum)
Patient ID: Calvin [name], male   DOB: 1961-03-11, 53 y.o.   MRN: [id number]  PCP: Angelica Chessman, MD  Subjective:   HPI: Patient is a 53 y.o. male here for low back/hip pain  2/2: 1. Left shoulder pain Patient reports for about 1 year he's had posterior to anterior left shoulder pain. Had tingling prior to this into the left arm as well. Tried prednisone, muscle relaxant, pain medication without benefit. Feels more weak than right side. No injuries or trauma  2. Right hip pain  Started about 4 months ago and progressed. Describes a warm feeling into right hip and leg. Started with right groin popping. Radiates posteriorly and down both of his legs now. Told he has sciatica in the past. No bowel/bladder dysfunction.  3/30: Patient here primarily for low back pain. Did about 5 visits of PT and home exercises. Pain feels about the same. States pain starts in lower back, goes down left leg to big toe but also has decreased sensation in entire right leg. Taking flexeril but not the voltaren. No swelling, bruising, new injuries. No bowel/bladder dysfunction. Also radiates up to upper back.  Past Medical History  Diagnosis Date  . Hypertension     Current Outpatient Prescriptions on File Prior to Visit  Medication Sig Dispense Refill  . amLODipine (NORVASC) 2.5 MG tablet Take 2.5 mg by mouth daily.      . cyclobenzaprine (FLEXERIL) 10 MG tablet Take 10 mg by mouth 3 (three) times daily as needed for muscle spasms.      . diclofenac (VOLTAREN) 75 MG EC tablet Take 75 mg by mouth 2 (two) times daily.      Marland Kitchen ibuprofen (ADVIL,MOTRIN) 200 MG tablet Take 800 mg by mouth every 6 (six) hours as needed for mild pain.      . Multiple Vitamins-Minerals (MULTIVITAMIN PO) Take 1 tablet by mouth daily.      . polyethylene glycol (MIRALAX / GLYCOLAX) packet Take 17 g by mouth daily.  14 each  0   No current facility-administered medications on file prior to visit.    Past Surgical  History  Procedure Laterality Date  . Foot surgery      right, ~ 2002    No Known Allergies  History   Social History  . Marital Status: Single    Spouse Name: N/A    Number of Children: N/A  . Years of Education: N/A   Occupational History  . Not on file.   Social History Main Topics  . Smoking status: Current Every Day Smoker -- 0.50 packs/day for 20 years    Types: Cigarettes  . Smokeless tobacco: Not on file  . Alcohol Use: Yes     Comment: occasional  . Drug Use: No  . Sexual Activity: Not on file   Other Topics Concern  . Not on file   Social History Narrative  . No narrative on file    Family History  Problem Relation Age of Onset  . Diabetes Mother   . Hypertension Mother   . Heart disease Mother   . Hyperlipidemia Mother   . Diabetes Brother   . Diabetes Daughter   . Hypertension Sister   . Diabetes Sister     BP 146/102  Pulse 83  Ht 6\' 1"  (1.854 m)  Wt 165 lb (74.844 kg)  BMI 21.77 kg/m2  Review of Systems: See HPI above.    Objective:  Physical Exam:  Gen: NAD  Back: No gross deformity, scoliosis.  TTP mildly bilateral paraspinal lumbar regions.  No midline or bony TTP. FROM with pain on flexion and extension. Strength LEs 5/5 all muscle groups.   2+ MSRs in patellar and achilles tendons, equal bilaterally. Negative SLRs. Sensation diminished throughout right leg.  Normal left leg. Negative logroll bilateral hips  Assessment & Plan:  1. Bilateral leg/low back pain - Distribution is unusual.  Possible this could be from spinal stenosis.  Not improving with PT, HEP, flexeril, ibuprofen.  Will go ahead with lumbar MRI to further assess.  May need neurologic evaluation if our workup is negative.   Addendum:  MRI reviewed and discussed with patient.  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  Small protrusion at L5-S1 but  no impingement.  Discussed options - he would like to try ESIs so will set these up.

## 2013-10-23 NOTE — Assessment & Plan Note (Signed)
Distribution is unusual.  Possible this could be from spinal stenosis.  Not improving with PT, HEP, flexeril, ibuprofen.  Will go ahead with lumbar MRI to further assess.  May need neurologic evaluation if our workup is negative.

## 2013-10-27 ENCOUNTER — Ambulatory Visit (HOSPITAL_BASED_OUTPATIENT_CLINIC_OR_DEPARTMENT_OTHER)
Admission: RE | Admit: 2013-10-27 | Discharge: 2013-10-27 | Disposition: A | Discharge status: Home / Self Care | Payer: No Typology Code available for payment source | Source: Ambulatory Visit | Attending: Family Medicine | Admitting: Family Medicine

## 2013-10-27 DIAGNOSIS — M545 Low back pain, unspecified: Secondary | ICD-10-CM | POA: Insufficient documentation

## 2013-10-27 DIAGNOSIS — M79609 Pain in unspecified limb: Secondary | ICD-10-CM | POA: Insufficient documentation

## 2013-10-27 DIAGNOSIS — M79604 Pain in right leg: Secondary | ICD-10-CM

## 2013-11-01 ENCOUNTER — Other Ambulatory Visit: Payer: Self-pay | Admitting: Family Medicine

## 2013-11-01 DIAGNOSIS — M549 Dorsalgia, unspecified: Secondary | ICD-10-CM

## 2013-11-12 ENCOUNTER — Inpatient Hospital Stay: Admission: RE | Admit: 2013-11-12 | Payer: No Typology Code available for payment source | Source: Ambulatory Visit

## 2013-11-16 ENCOUNTER — Ambulatory Visit
Admission: RE | Admit: 2013-11-16 | Discharge: 2013-11-16 | Disposition: A | Discharge status: Home / Self Care | Payer: No Typology Code available for payment source | Source: Ambulatory Visit | Attending: Family Medicine | Admitting: Family Medicine

## 2013-11-16 ENCOUNTER — Other Ambulatory Visit: Payer: No Typology Code available for payment source

## 2013-11-16 ENCOUNTER — Other Ambulatory Visit: Payer: Self-pay | Admitting: Family Medicine

## 2013-11-16 DIAGNOSIS — M549 Dorsalgia, unspecified: Secondary | ICD-10-CM

## 2013-11-16 MED ORDER — METHYLPREDNISOLONE ACETATE 40 MG/ML INJ SUSP (RADIOLOG
120.0000 mg | Freq: Once | INTRAMUSCULAR | Status: AC
  Administered 2013-11-16: 120 mg via EPIDURAL

## 2013-11-16 MED ORDER — IOHEXOL 180 MG/ML  SOLN
1.0000 mL | Freq: Once | INTRAMUSCULAR | Status: AC | PRN
  Administered 2013-11-16: 1 mL via EPIDURAL

## 2013-11-16 NOTE — Discharge Instructions (Signed)

## 2013-12-05 ENCOUNTER — Ambulatory Visit: Payer: No Typology Code available for payment source | Admitting: Family Medicine

## 2013-12-18 ENCOUNTER — Ambulatory Visit (INDEPENDENT_AMBULATORY_CARE_PROVIDER_SITE_OTHER): Payer: No Typology Code available for payment source | Admitting: Family Medicine

## 2013-12-18 ENCOUNTER — Encounter: Payer: Self-pay | Admitting: Family Medicine

## 2013-12-18 VITALS — BP 149/109 | HR 101 | Temp 98.8°F | Wt 170.0 lb

## 2013-12-18 DIAGNOSIS — G8929 Other chronic pain: Secondary | ICD-10-CM

## 2013-12-18 DIAGNOSIS — M545 Low back pain, unspecified: Secondary | ICD-10-CM

## 2013-12-18 NOTE — Patient Instructions (Signed)
We will go ahead with a neurosurgery referral. Options are limited at this point - go ahead with applying for medicaid. We will also arrange for the second series of injections for your back.

## 2013-12-24 ENCOUNTER — Other Ambulatory Visit: Payer: Self-pay | Admitting: Family Medicine

## 2013-12-24 ENCOUNTER — Encounter: Payer: Self-pay | Admitting: Family Medicine

## 2013-12-24 DIAGNOSIS — M549 Dorsalgia, unspecified: Secondary | ICD-10-CM

## 2013-12-24 NOTE — Progress Notes (Signed)
Patient ID: Calvin [name], male   DOB: 02-Nov-1960, 53 y.o.   MRN: [id number]  PCP: Angelica Chessman, MD  Subjective:   HPI: Patient is a 53 y.o. male here for low back/hip pain  2/2: 1. Left shoulder pain Patient reports for about 1 year he's had posterior to anterior left shoulder pain. Had tingling prior to this into the left arm as well. Tried prednisone, muscle relaxant, pain medication without benefit. Feels more weak than right side. No injuries or trauma  2. Right hip pain  Started about 4 months ago and progressed. Describes a warm feeling into right hip and leg. Started with right groin popping. Radiates posteriorly and down both of his legs now. Told he has sciatica in the past. No bowel/bladder dysfunction.  3/30: Patient here primarily for low back pain. Did about 5 visits of PT and home exercises. Pain feels about the same. States pain starts in lower back, goes down left leg to big toe but also has decreased sensation in entire right leg. Taking flexeril but not the voltaren. No swelling, bruising, new injuries. No bowel/bladder dysfunction. Also radiates up to upper back.  5/26: Patient reports injections helped a little with his low back but pain back up to an 8/10. Radiates down left leg and right leg (decreased sensation. Takes flexeril as needed. No bowel/bladder dysfunction.  Past Medical History  Diagnosis Date  . Hypertension     Current Outpatient Prescriptions on File Prior to Visit  Medication Sig Dispense Refill  . amLODipine (NORVASC) 2.5 MG tablet Take 2.5 mg by mouth daily.      . cyclobenzaprine (FLEXERIL) 10 MG tablet Take 10 mg by mouth 3 (three) times daily as needed for muscle spasms.      . cyclobenzaprine (FLEXERIL) 10 MG tablet Take 1 tablet (10 mg total) by mouth 2 (two) times daily as needed for muscle spasms.  20 tablet  0  . diclofenac (VOLTAREN) 75 MG EC tablet Take 75 mg by mouth 2 (two) times daily.      Marland Kitchen  HYDROcodone-acetaminophen (NORCO/VICODIN) 5-325 MG per tablet Take 2 tablets by mouth every 4 (four) hours as needed for moderate pain.  20 tablet  0  . ibuprofen (ADVIL,MOTRIN) 200 MG tablet Take 800 mg by mouth every 6 (six) hours as needed for mild pain.      . Multiple Vitamins-Minerals (MULTIVITAMIN PO) Take 1 tablet by mouth daily.      . polyethylene glycol (MIRALAX / GLYCOLAX) packet Take 17 g by mouth daily.  14 each  0   No current facility-administered medications on file prior to visit.    Past Surgical History  Procedure Laterality Date  . Foot surgery      right, ~ 2002    No Known Allergies  History   Social History  . Marital Status: Single    Spouse Name: N/A    Number of Children: N/A  . Years of Education: N/A   Occupational History  . Not on file.   Social History Main Topics  . Smoking status: Current Every Day Smoker -- 0.50 packs/day for 20 years    Types: Cigarettes  . Smokeless tobacco: Not on file  . Alcohol Use: Yes     Comment: occasional  . Drug Use: No  . Sexual Activity: Not on file   Other Topics Concern  . Not on file   Social History Narrative  . No narrative on file    Family History  Problem Relation  Age of Onset  . Diabetes Mother   . Hypertension Mother   . Heart disease Mother   . Hyperlipidemia Mother   . Diabetes Brother   . Diabetes Daughter   . Hypertension Sister   . Diabetes Sister     BP 149/109  Pulse 101  Temp(Src) 98.8 F (37.1 C) (Oral)  Wt 170 lb (77.111 kg)  Review of Systems: See HPI above.    Objective:  Physical Exam:  Gen: NAD  Back: No gross deformity, scoliosis. TTP mildly bilateral paraspinal lumbar regions.  No midline or bony TTP. FROM with pain on flexion and extension. Strength LEs 5/5 all muscle groups.   2+ MSRs in patellar and achilles tendons, equal bilaterally. Negative SLRs. Sensation diminished throughout right leg.  Normal left leg. Negative logroll bilateral  hips  Assessment & Plan:  1. Bilateral leg/low back pain - MRI showed bilateral L5 impingement.  Only mild improvement with injections.  Completed PT and doing HEP.  Will arrange repeat ESIs but also refer to neurosurgery.  Do not feel there is anything further we can offer than this.

## 2013-12-24 NOTE — Assessment & Plan Note (Signed)
MRI showed bilateral L5 impingement.  Only mild improvement with injections.  Completed PT and doing HEP.  Will arrange repeat ESIs but also refer to neurosurgery.  Do not feel there is anything further we can offer than this.

## 2013-12-26 ENCOUNTER — Ambulatory Visit: Payer: No Typology Code available for payment source | Admitting: Internal Medicine

## 2014-01-07 ENCOUNTER — Ambulatory Visit: Payer: Self-pay | Attending: Internal Medicine | Admitting: Internal Medicine

## 2014-01-07 ENCOUNTER — Ambulatory Visit
Admission: RE | Admit: 2014-01-07 | Discharge: 2014-01-07 | Disposition: A | Discharge status: Home / Self Care | Payer: Self-pay | Source: Ambulatory Visit | Attending: Family Medicine | Admitting: Family Medicine

## 2014-01-07 ENCOUNTER — Other Ambulatory Visit: Payer: Self-pay | Admitting: Family Medicine

## 2014-01-07 ENCOUNTER — Encounter: Payer: Self-pay | Admitting: Internal Medicine

## 2014-01-07 VITALS — BP 140/100 | HR 68 | Temp 98.7°F | Resp 16 | Wt 163.6 lb

## 2014-01-07 DIAGNOSIS — M549 Dorsalgia, unspecified: Secondary | ICD-10-CM

## 2014-01-07 DIAGNOSIS — N529 Male erectile dysfunction, unspecified: Secondary | ICD-10-CM | POA: Insufficient documentation

## 2014-01-07 DIAGNOSIS — G8929 Other chronic pain: Secondary | ICD-10-CM

## 2014-01-07 DIAGNOSIS — M545 Low back pain, unspecified: Secondary | ICD-10-CM | POA: Insufficient documentation

## 2014-01-07 DIAGNOSIS — E559 Vitamin D deficiency, unspecified: Secondary | ICD-10-CM | POA: Insufficient documentation

## 2014-01-07 DIAGNOSIS — I10 Essential (primary) hypertension: Secondary | ICD-10-CM | POA: Insufficient documentation

## 2014-01-07 DIAGNOSIS — F17200 Nicotine dependence, unspecified, uncomplicated: Secondary | ICD-10-CM | POA: Insufficient documentation

## 2014-01-07 DIAGNOSIS — Z79899 Other long term (current) drug therapy: Secondary | ICD-10-CM | POA: Insufficient documentation

## 2014-01-07 MED ORDER — AMLODIPINE BESYLATE 2.5 MG PO TABS: 2.5000 mg | ORAL_TABLET | Freq: Every day | ORAL | Status: DC

## 2014-01-07 MED ORDER — SILDENAFIL CITRATE 100 MG PO TABS: 50.0000 mg | ORAL_TABLET | Freq: Every day | ORAL | Status: DC | PRN

## 2014-01-07 MED ORDER — IOHEXOL 180 MG/ML  SOLN
1.0000 mL | Freq: Once | INTRAMUSCULAR | Status: AC | PRN
  Administered 2014-01-07: 1 mL via EPIDURAL

## 2014-01-07 MED ORDER — CLONIDINE HCL 0.1 MG PO TABS
0.1000 mg | ORAL_TABLET | Freq: Once | ORAL | Status: AC
  Administered 2014-01-07: 0.1 mg via ORAL

## 2014-01-07 MED ORDER — METHYLPREDNISOLONE ACETATE 40 MG/ML INJ SUSP (RADIOLOG
120.0000 mg | Freq: Once | INTRAMUSCULAR | Status: AC
Start: 2014-01-07 — End: 2014-01-07
  Administered 2014-01-07: 120 mg via EPIDURAL

## 2014-01-07 MED ORDER — VITAMIN D (ERGOCALCIFEROL) 1.25 MG (50000 UNIT) PO CAPS: 50000.0000 [IU] | ORAL_CAPSULE | ORAL | Status: DC

## 2014-01-07 MED ORDER — CYCLOBENZAPRINE HCL 10 MG PO TABS: 10.0000 mg | ORAL_TABLET | Freq: Two times a day (BID) | ORAL | Status: DC | PRN

## 2014-01-07 NOTE — Discharge Instructions (Signed)

## 2014-01-07 NOTE — Progress Notes (Signed)
Patient here for follow up States has some pinched nerves in his back Had a steroid shot about an hour ago Presents today with elevated blood pressure

## 2014-01-07 NOTE — Patient Instructions (Signed)
DASH Diet  The DASH diet stands for "Dietary Approaches to Stop Hypertension." It is a healthy eating plan that has been shown to reduce high blood pressure (hypertension) in as little as 14 days, while also possibly providing other significant health benefits. These other health benefits include reducing the risk of breast cancer after menopause and reducing the risk of type 2 diabetes, heart disease, colon cancer, and stroke. Health benefits also include weight loss and slowing kidney failure in patients with chronic kidney disease.   DIET GUIDELINES  · Limit salt (sodium). Your diet should contain less than 1500 mg of sodium daily.  · Limit refined or processed carbohydrates. Your diet should include mostly whole grains. Desserts and added sugars should be used sparingly.  · Include small amounts of heart-healthy fats. These types of fats include nuts, oils, and tub margarine. Limit saturated and trans fats. These fats have been shown to be harmful in the body.  CHOOSING FOODS   The following food groups are based on a 2000 calorie diet. See your Registered Dietitian for individual calorie needs.  Grains and Grain Products (6 to 8 servings daily)  · Eat More Often: Whole-wheat bread, brown rice, whole-grain or wheat pasta, quinoa, popcorn without added fat or salt (air popped).  · Eat Less Often: White bread, white pasta, white rice, cornbread.  Vegetables (4 to 5 servings daily)  · Eat More Often: Fresh, frozen, and canned vegetables. Vegetables may be raw, steamed, roasted, or grilled with a minimal amount of fat.  · Eat Less Often/Avoid: Creamed or fried vegetables. Vegetables in a cheese sauce.  Fruit (4 to 5 servings daily)  · Eat More Often: All fresh, canned (in natural juice), or frozen fruits. Dried fruits without added sugar. One hundred percent fruit juice (½ cup [237 mL] daily).  · Eat Less Often: Dried fruits with added sugar. Canned fruit in light or heavy syrup.  Lean Meats, Fish, and Poultry (2  servings or less daily. One serving is 3 to 4 oz [85-114 g]).  · Eat More Often: Ninety percent or leaner ground beef, tenderloin, sirloin. Round cuts of beef, chicken breast, turkey breast. All fish. Grill, bake, or broil your meat. Nothing should be fried.  · Eat Less Often/Avoid: Fatty cuts of meat, turkey, or chicken leg, thigh, or wing. Fried cuts of meat or fish.  Dairy (2 to 3 servings)  · Eat More Often: Low-fat or fat-free milk, low-fat plain or light yogurt, reduced-fat or part-skim cheese.  · Eat Less Often/Avoid: Milk (whole, 2%). Whole milk yogurt. Full-fat cheeses.  Nuts, Seeds, and Legumes (4 to 5 servings per week)  · Eat More Often: All without added salt.  · Eat Less Often/Avoid: Salted nuts and seeds, canned beans with added salt.  Fats and Sweets (limited)  · Eat More Often: Vegetable oils, tub margarines without trans fats, sugar-free gelatin. Mayonnaise and salad dressings.  · Eat Less Often/Avoid: Coconut oils, palm oils, butter, stick margarine, cream, half and half, cookies, candy, pie.  FOR MORE INFORMATION  The Dash Diet Eating Plan: www.dashdiet.org  Document Released: 07/01/2011 Document Revised: 10/04/2011 Document Reviewed: 07/01/2011  [employer]® Patient Information ©2014 [employer].

## 2014-01-07 NOTE — Progress Notes (Signed)
MRN: [id number] Name: Calvin [name]  Sex: male Age: 53 y.o. DOB: 1961-02-09  Allergies: Review of patient's allergies indicates no known allergies.  Chief Complaint  Patient presents with  . Follow-up    HPI: Patient is 54 y.o. male who has history of hypertension lower back pain comes today for followup as per patient he has not taken his blood pressure medication for the last few days because he ran out, his blood pressure today is elevated denies any headache dizziness chest pain or shortness of breath, patient is to smoke cigarettes, I have advised patient to quit smoking, he also has chronic low back pain following up with sports medicine and is undergoing steroid injections . Patient also had a blood work done which was reviewed with the patient noticed vitamin D deficiency as per patient he is not taking any supplements. Patient also reported to have symptoms of erectile dysfunction, patient does not have diabetes and denies any history of heart disease.   Past Medical History  Diagnosis Date  . Hypertension     Past Surgical History  Procedure Laterality Date  . Foot surgery      right, ~ 2002      Medication List       This list is accurate as of: 01/07/14  4:27 PM.  Always use your most recent med list.               amLODipine 2.5 MG tablet  Commonly known as:  NORVASC  Take 1 tablet (2.5 mg total) by mouth daily.     cyclobenzaprine 10 MG tablet  Commonly known as:  FLEXERIL  Take 10 mg by mouth 3 (three) times daily as needed for muscle spasms.     cyclobenzaprine 10 MG tablet  Commonly known as:  FLEXERIL  Take 1 tablet (10 mg total) by mouth 2 (two) times daily as needed for muscle spasms.     diclofenac 75 MG EC tablet  Commonly known as:  VOLTAREN  Take 75 mg by mouth 2 (two) times daily.     HYDROcodone-acetaminophen 5-325 MG per tablet  Commonly known as:  NORCO/VICODIN  Take 2 tablets by mouth every 4 (four) hours as needed for moderate pain.       ibuprofen 200 MG tablet  Commonly known as:  ADVIL,MOTRIN  Take 800 mg by mouth every 6 (six) hours as needed for mild pain.     MULTIVITAMIN PO  Take 1 tablet by mouth daily.     polyethylene glycol packet  Commonly known as:  MIRALAX / GLYCOLAX  Take 17 g by mouth daily.     sildenafil 100 MG tablet  Commonly known as:  VIAGRA  Take 0.5-1 tablets (50-100 mg total) by mouth daily as needed for erectile dysfunction.     Vitamin D (Ergocalciferol) 50000 UNITS Caps capsule  Commonly known as:  DRISDOL  Take 1 capsule (50,000 Units total) by mouth every 7 (seven) days.        Meds ordered this encounter  Medications  . cloNIDine (CATAPRES) tablet 0.1 mg    Sig:     Per Dr Annitta Needs  . Vitamin D, Ergocalciferol, (DRISDOL) 50000 UNITS CAPS capsule    Sig: Take 1 capsule (50,000 Units total) by mouth every 7 (seven) days.    Dispense:  12 capsule    Refill:  0  . cyclobenzaprine (FLEXERIL) 10 MG tablet    Sig: Take 1 tablet (10 mg total) by mouth 2 (two)  times daily as needed for muscle spasms.    Dispense:  20 tablet    Refill:  0    Order Specific Question:  Supervising Provider    Answer:  Jake Michaelis, DAVID C [id number]  . amLODipine (NORVASC) 2.5 MG tablet    Sig: Take 1 tablet (2.5 mg total) by mouth daily.    Dispense:  30 tablet    Refill:  4  . sildenafil (VIAGRA) 100 MG tablet    Sig: Take 0.5-1 tablets (50-100 mg total) by mouth daily as needed for erectile dysfunction.    Dispense:  5 tablet    Refill:  1     There is no immunization history on file for this patient.  Family History  Problem Relation Age of Onset  . Diabetes Mother   . Hypertension Mother   . Heart disease Mother   . Hyperlipidemia Mother   . Diabetes Brother   . Diabetes Daughter   . Hypertension Sister   . Diabetes Sister     History  Substance Use Topics  . Smoking status: Current Every Day Smoker -- 0.50 packs/day for 20 years    Types: Cigarettes  . Smokeless tobacco: Not on file   . Alcohol Use: Yes     Comment: occasional    Review of Systems   As noted in HPI  Filed Vitals:   01/07/14 1627  BP: 140/100  Pulse:   Temp:   Resp:     Physical Exam  Physical Exam  Constitutional: No distress.  Eyes: EOM are normal. Pupils are equal, round, and reactive to light.  Cardiovascular: Normal rate and regular rhythm.   Pulmonary/Chest: Breath sounds normal. No respiratory distress. He has no wheezes. He has no rales.    CBC    Component Value Date/Time   WBC 6.2 09/16/2013 1625   RBC 5.19 09/16/2013 1625   HGB 15.8 09/16/2013 1625   HCT 44.2 09/16/2013 1625   PLT 226 09/16/2013 1625   MCV 85.2 09/16/2013 1625   LYMPHSABS 2.3 08/10/2013 1212   MONOABS 0.5 08/10/2013 1212   EOSABS 0.2 08/10/2013 1212   BASOSABS 0.0 08/10/2013 1212    CMP     Component Value Date/Time   NA 138 09/16/2013 1625   K 4.5 09/16/2013 1625   CL 101 09/16/2013 1625   CO2 23 09/16/2013 1625   GLUCOSE 104* 09/16/2013 1625   BUN 12 09/16/2013 1625   CREATININE 0.93 09/16/2013 1625   CREATININE 0.96 08/10/2013 1212   CALCIUM 9.4 09/16/2013 1625   PROT 7.8 09/16/2013 1625   ALBUMIN 4.0 09/16/2013 1625   AST 23 09/16/2013 1625   ALT 20 09/16/2013 1625   ALKPHOS 106 09/16/2013 1625   BILITOT 0.4 09/16/2013 1625   GFRNONAA >90 09/16/2013 1625   GFRNONAA >89 08/10/2013 1212   GFRAA >90 09/16/2013 1625   GFRAA >89 08/10/2013 1212    Lab Results  Component Value Date/Time   CHOL 180 08/10/2013 12:12 PM    No components found with this basename: hga1c    Lab Results  Component Value Date/Time   AST 23 09/16/2013  4:25 PM    Assessment and Plan  Essential hypertension, benign - Plan: Patient was given cloNIDine (CATAPRES) tablet 0.1 mg, repeat manual blood pressure is 140/100, resume back on, amLODipine (NORVASC) 2.5 MG tablet  Smoking Advised patient to quit smoking  Chronic low back pain - Plan: cyclobenzaprine (FLEXERIL) 10 MG tablet, advised to apply heating pad and followup with her  sports medicine.  Unspecified vitamin D deficiency - Plan: Vitamin D, Ergocalciferol, (DRISDOL) 50000 UNITS CAPS capsule  ED (erectile dysfunction) - Plan: Trial of sildenafil (VIAGRA) 100 MG tablet    Return in about 3 months (around 04/09/2014) for hypertension.  Lorayne Marek, MD

## 2014-01-30 ENCOUNTER — Ambulatory Visit: Payer: Self-pay | Attending: Internal Medicine

## 2014-03-07 ENCOUNTER — Ambulatory Visit: Payer: Self-pay

## 2014-04-29 ENCOUNTER — Emergency Department (HOSPITAL_COMMUNITY): Payer: Self-pay

## 2014-04-29 ENCOUNTER — Encounter (HOSPITAL_COMMUNITY): Payer: Self-pay | Admitting: Emergency Medicine

## 2014-04-29 ENCOUNTER — Emergency Department (HOSPITAL_COMMUNITY)
Admission: EM | Admit: 2014-04-29 | Discharge: 2014-04-30 | Disposition: A | Discharge status: Home / Self Care | Payer: Self-pay | Attending: Emergency Medicine | Admitting: Emergency Medicine

## 2014-04-29 DIAGNOSIS — I10 Essential (primary) hypertension: Secondary | ICD-10-CM | POA: Insufficient documentation

## 2014-04-29 DIAGNOSIS — M5126 Other intervertebral disc displacement, lumbar region: Secondary | ICD-10-CM

## 2014-04-29 DIAGNOSIS — Z79899 Other long term (current) drug therapy: Secondary | ICD-10-CM | POA: Insufficient documentation

## 2014-04-29 DIAGNOSIS — M5124 Other intervertebral disc displacement, thoracic region: Secondary | ICD-10-CM

## 2014-04-29 DIAGNOSIS — M5415 Radiculopathy, thoracolumbar region: Secondary | ICD-10-CM | POA: Insufficient documentation

## 2014-04-29 DIAGNOSIS — Z720 Tobacco use: Secondary | ICD-10-CM | POA: Insufficient documentation

## 2014-04-29 DIAGNOSIS — Z791 Long term (current) use of non-steroidal anti-inflammatories (NSAID): Secondary | ICD-10-CM | POA: Insufficient documentation

## 2014-04-29 LAB — CBC WITH DIFFERENTIAL/PLATELET
BASOS ABS: 0 10*3/uL (ref 0.0–0.1)
Basophils Relative: 0 % (ref 0–1)
EOS PCT: 1 % (ref 0–5)
Eosinophils Absolute: 0.1 10*3/uL (ref 0.0–0.7)
HCT: 41 % (ref 39.0–52.0)
Hemoglobin: 14.2 g/dL (ref 13.0–17.0)
LYMPHS PCT: 25 % (ref 12–46)
Lymphs Abs: 2.2 10*3/uL (ref 0.7–4.0)
MCH: 29.4 pg (ref 26.0–34.0)
MCHC: 34.6 g/dL (ref 30.0–36.0)
MCV: 84.9 fL (ref 78.0–100.0)
MONO ABS: 0.7 10*3/uL (ref 0.1–1.0)
Monocytes Relative: 8 % (ref 3–12)
Neutro Abs: 5.6 10*3/uL (ref 1.7–7.7)
Neutrophils Relative %: 66 % (ref 43–77)
Platelets: 235 10*3/uL (ref 150–400)
RBC: 4.83 MIL/uL (ref 4.22–5.81)
RDW: 12.8 % (ref 11.5–15.5)
WBC: 8.6 10*3/uL (ref 4.0–10.5)

## 2014-04-29 LAB — BASIC METABOLIC PANEL
ANION GAP: 15 (ref 5–15)
BUN: 12 mg/dL (ref 6–23)
CALCIUM: 9.1 mg/dL (ref 8.4–10.5)
CO2: 20 meq/L (ref 19–32)
CREATININE: 0.88 mg/dL (ref 0.50–1.35)
Chloride: 104 mEq/L (ref 96–112)
GFR calc Af Amer: >90 mL/min (ref >90)
GFR calc non Af Amer: >90 mL/min (ref >90)
Glucose, Bld: 99 mg/dL (ref 70–99)
Potassium: 4.2 mEq/L (ref 3.7–5.3)
Sodium: 139 mEq/L (ref 137–147)

## 2014-04-29 LAB — RAPID URINE DRUG SCREEN, HOSP PERFORMED
AMPHETAMINES: NOT DETECTED
BARBITURATES: NOT DETECTED
BENZODIAZEPINES: NOT DETECTED
Cocaine: NOT DETECTED
Opiates: POSITIVE — AB
Tetrahydrocannabinol: NOT DETECTED

## 2014-04-29 LAB — URINE MICROSCOPIC-ADD ON

## 2014-04-29 LAB — URINALYSIS, ROUTINE W REFLEX MICROSCOPIC
Bilirubin Urine: NEGATIVE
GLUCOSE, UA: NEGATIVE mg/dL
Hgb urine dipstick: NEGATIVE
KETONES UR: NEGATIVE mg/dL
Leukocytes, UA: NEGATIVE
NITRITE: NEGATIVE
PH: 5 (ref 5.0–8.0)
Protein, ur: 30 mg/dL — AB
Specific Gravity, Urine: 1.019 (ref 1.005–1.030)
Urobilinogen, UA: 0.2 mg/dL (ref 0.0–1.0)

## 2014-04-29 MED ORDER — GADOBENATE DIMEGLUMINE 529 MG/ML IV SOLN
15.0000 mL | Freq: Once | INTRAVENOUS | Status: AC | PRN
  Administered 2014-04-29: 15 mL via INTRAVENOUS

## 2014-04-29 MED ORDER — MORPHINE SULFATE 4 MG/ML IJ SOLN
4.0000 mg | Freq: Once | INTRAMUSCULAR | Status: AC
  Administered 2014-04-29: 4 mg via INTRAVENOUS
  Filled 2014-04-29: qty 1

## 2014-04-29 MED ORDER — SODIUM CHLORIDE 0.9 % IV BOLUS (SEPSIS)
1000.0000 mL | Freq: Once | INTRAVENOUS | Status: AC
  Administered 2014-04-29: 1000 mL via INTRAVENOUS

## 2014-04-29 NOTE — ED Notes (Signed)
Called MRI to check on status they report there are 2 patients in front of him.

## 2014-04-29 NOTE — ED Notes (Signed)
Pt aware of the need for a urine sample. IV fluids started and urinal provided. Patient will press call button when patient voids.

## 2014-04-29 NOTE — ED Notes (Addendum)
Pt has 8 month hx of pinched nerve in back had been seeing someone in [place] no longer sees them. Gotten worse over the last 4-5 days. Pt also states he has been having been having bowel incontinence but has some controls at times.

## 2014-04-29 NOTE — ED Notes (Signed)
Pt voided 272mL in urinal. Bladder scan reports 84mL in bladder after voiding.

## 2014-04-29 NOTE — ED Notes (Signed)
Pt in MRI.

## 2014-04-29 NOTE — ED Provider Notes (Signed)
TIME SEEN: 6:49 PM  CHIEF COMPLAINT: Back pain, lower extremity numbness, bowel or bladder incontinence  HPI: Patient is a 53 y.o. M with history of hypertension, IV drug use who presents emergency department with back pain. He reports that his back has been hurting for the past 8 months that has been progressively worse over the past several days. He denies any history of injury. He has had numbness and tingling in his bilateral lower extremities is also been worse over the past several days. He reports he is unable to feel hot sensation with his right lower extremity. He reports he is also had bowel and bladder incontinence over the past several days. Denies any urinary retention. No fever. No difficulty walking. No weakness. He doesn't have a PCP. He reports she's had an MRI approximately 2 months ago that she quit 2 pinched nerves" in his lower back. He does not have followup with a neurosurgeon.  ROS: See HPI Constitutional: no fever  Eyes: no drainage  ENT: no runny nose   Cardiovascular:  no chest pain  Resp: no SOB  GI: no vomiting GU: no dysuria Integumentary: no rash  Allergy: no hives  Musculoskeletal: no leg swelling  Neurological: no slurred speech ROS otherwise negative  PAST MEDICAL HISTORY/PAST SURGICAL HISTORY:  Past Medical History  Diagnosis Date  . Hypertension     MEDICATIONS:  Prior to Admission medications   Medication Sig Start Date End Date Taking? Authorizing Provider  amLODipine (NORVASC) 2.5 MG tablet Take 1 tablet (2.5 mg total) by mouth daily. 01/07/14  Yes Lorayne Marek, MD  Aspirin-Acetaminophen (GOODY BODY PAIN) 500-325 MG PACK Take 2 packets by mouth every 6 (six) hours as needed (pain).   Yes Historical Provider, MD  cyclobenzaprine (FLEXERIL) 10 MG tablet Take 10 mg by mouth 3 (three) times daily as needed for muscle spasms.   Yes Historical Provider, MD  diclofenac (VOLTAREN) 75 MG EC tablet Take 75 mg by mouth 2 (two) times daily.   Yes Historical  Provider, MD  Diphenhydramine-APAP, sleep, (EXCEDRIN PM PO) Take 2 tablets by mouth at bedtime as needed (for sleep).   Yes Historical Provider, MD  HYDROcodone-acetaminophen (NORCO/VICODIN) 5-325 MG per tablet Take 2 tablets by mouth every 4 (four) hours as needed for moderate pain. 10/22/13  Yes Hollace Kinnier Sofia, PA-C  ibuprofen (ADVIL,MOTRIN) 200 MG tablet Take 800 mg by mouth every 6 (six) hours as needed for mild pain.   Yes Historical Provider, MD    ALLERGIES:  No Known Allergies  SOCIAL HISTORY:  History  Substance Use Topics  . Smoking status: Current Every Day Smoker -- 0.50 packs/day for 20 years    Types: Cigarettes  . Smokeless tobacco: Not on file  . Alcohol Use: Yes     Comment: occasional    FAMILY HISTORY: Family History  Problem Relation Age of Onset  . Diabetes Mother   . Hypertension Mother   . Heart disease Mother   . Hyperlipidemia Mother   . Diabetes Brother   . Diabetes Daughter   . Hypertension Sister   . Diabetes Sister     EXAM: BP 127/100  Pulse 95  Temp(Src) 98.3 F (36.8 C) (Oral)  Resp 16  SpO2 97% CONSTITUTIONAL: Alert and oriented and responds appropriately to questions. Well-appearing; well-nourished HEAD: Normocephalic EYES: Conjunctivae clear, PERRL ENT: normal nose; no rhinorrhea; moist mucous membranes; pharynx without lesions noted NECK: Supple, no meningismus, no LAD  CARD: RRR; S1 and S2 appreciated; no murmurs, no clicks,  no rubs, no gallops RESP: Normal chest excursion without splinting or tachypnea; breath sounds clear and equal bilaterally; no wheezes, no rhonchi, no rales,  ABD/GI: Normal bowel sounds; non-distended; soft, non-tender, no rebound, no guarding RECTAL:  No gross blood or melena, normal rectal tone BACK:  The back appears normal and is tender to palpation throughout the lower thoracic and lumbar spine without step-off or deformity, no CVA tenderness EXT: Normal ROM in all joints; non-tender to palpation; no  edema; normal capillary refill; no cyanosis    SKIN: Normal color for age and race; warm NEURO: Moves all extremities equally; reports decreased sensation in his bilateral lower extremities, 2+ deep tendon reflexes in bilateral upper and lower Sherman's, strength 5/5 in all 4 extremities, negative straight leg raise PSYCH: The patient's mood and manner are appropriate. Grooming and personal hygiene are appropriate.  MEDICAL DECISION MAKING: Patient here with complaints of increasing back pain, lower any numbness, bowel or bladder incontinence. His neurologic exam is currently reassuring but he does report some sensory deficit in his lower extremities. Does have normal strength and reflexes and normal rectal tone. We'll obtain a postvoid residual. Given he has a history of IV drug abuse as well as these new neurologic complaints, will obtain an MRI of his lumbar and thoracic spine with and without IV contrast to evaluate for possible spinal cord compression, cauda equina, epidural abscess, discitis or osteomyelitis. We'll give pain medications, IV fluids. We'll obtain labs and urine.  ED PROGRESS: Patient's labs are unremarkable. Urine drug screen is positive for opiates. Postvoid residual was 0 mL.   12:45 PM  D/w Dr. Jeannine Boga with radiology.  MRI technician did not transfer all of the MR images and radiologist has been unable to obtain these images. He does state he does not see any cord compression or sign of abscess. He does have herniated disc at T8-T9, L4-L5, L5-S1. Given patient's benign neurologic exam, I feel he is safe to be discharged home. I do not feel there is any surgical emergency. Radiology will followup on images in the morning. We'll discharge with pain medication and return precautions.    [place], DO 04/30/14 [phone]

## 2014-04-29 NOTE — ED Notes (Signed)
Pt returned from MRI now.

## 2014-04-30 MED ORDER — OXYCODONE-ACETAMINOPHEN 5-325 MG PO TABS: 1.0000 | ORAL_TABLET | ORAL | Status: DC | PRN

## 2014-04-30 MED ORDER — IBUPROFEN 800 MG PO TABS: 800.0000 mg | ORAL_TABLET | Freq: Three times a day (TID) | ORAL | Status: DC | PRN

## 2014-04-30 MED ORDER — METHYLPREDNISOLONE (PAK) 4 MG PO TABS: ORAL_TABLET | ORAL | Status: DC

## 2014-04-30 NOTE — ED Notes (Signed)
Pt leaving with ALL belongings he arrived with. Pt ambulated upon dc.

## 2014-04-30 NOTE — Discharge Instructions (Signed)
Herniated Disk A herniated disk occurs when a disk in your spine bulges out too far. This condition is also called a ruptured disk or slipped disk. Your spine (backbone) is made up of bones called vertebrae. Between each pair of vertebrae is an oval disk with a soft, spongy center that acts as a shock absorber when you move. The spongy center is surrounded by a tough outer ring. When you have a herniated disk, the spongy center of the disk bulges out or ruptures through the outer ring. A herniated disk can press on a nerve between your vertebrae and cause pain. A herniated disk can occur anywhere in your back or neck area, but the lower back is the most common spot. CAUSES  In many cases, a herniated disk occurs just from getting older. As you age, the spongy insides of your disks tend to shrink and dry out. A herniated disk can result from gradual wear and tear. Injury or sudden strain can also cause a herniated disk.  RISK FACTORS Aging is the main risk factor for a herniated disk. Other risk factors include:  Being a man between the ages of 86 and 62 years.  Having a job that requires heavy lifting, bending, or twisting.  Having a job that requires long hours of driving.  Not getting enough exercise.  Being overweight.  Smoking. SIGNS AND SYMPTOMS  Signs and symptoms depend on which disk is herniated.  For a herniated disk in the lower back, you may have sharp pain in:  One part of your leg, hip, or buttocks.  The back of your calf.  The top or sole of your foot (sciatica).   For a herniated disk in the neck, you may feel pain:  When you move your neck.  Near or over your shoulder blade.  That moves to your upper arm, forearm, or fingers.   You may also have muscle weakness. It may be hard to:  Lift your leg or arm.  Stand on your toes.  Squeeze tightly with one of your hands.  Other symptoms can include:  Numbness or tingling in the affected areas of your  body.  Loss of bladder or bowel control. This is a rare but serious sign of a severe herniated disk in the lower back. DIAGNOSIS  Your health care provider will do a physical exam. During this exam, you may have to move certain body parts or assume various positions. For example, your health care provider may do the straight-leg test. This is a good way to test for a herniated disk in your lower back. In this test, the health care provider lifts your leg while you lie on your back. This is to see if you feel pain down your leg. Your health care provider will also check for numbness or loss of feeling.  Your health care provider will also check your:  Reflexes.  Muscle strength.  Posture.  Other tests may be done to help in making a diagnosis. These may include:  An X-ray of the spine to rule out other causes of back pain.   Other imaging studies, such as an MRI or CT scan. This is to check whether the herniated disk is pressing on your spinal canal.  Electromyography (EMG). This test checks the nerves that control muscles. It is sometimes used to identify the specific area of nerve involvement.  TREATMENT  In many cases, herniated disk symptoms go away over a period of days or weeks. You will most  likely be free of symptoms in 3-4 months. Treatment may include the following:  The initial treatment for a herniated disk is ashort period of rest.  Bed rest is often limited to 1 or 2 days. Resting for too long delays recovery.  If you have a herniated disk in your lower back, you should avoid sitting as much as possible because sitting increases pressure on the disk.  Medicines. These may include:   Nonsteroidal anti-inflammatory drugs (NSAIDs).  Muscle relaxants for back spasms.  Narcotic pain medicine if your pain is very bad.   Steroid injections. You may need these along the involved nerve root to help control pain. The steroid is injected in the area of the herniated disk.  It helps by reducing swelling around the disk.  Physical therapy. This may include exercises to strengthen the muscles that help support your spine.   You may need surgery if other treatments do not work.  HOME CARE INSTRUCTIONS Follow all your health care provider's instructions. These may include:  Take all medicines as directed by your health care provider.  Rest for 2 days and then start moving.  Do not sit or stand for long periods of time.  Maintain good posture when sitting and standing.  Avoid movements that cause pain, such as bending or lifting.  When you are able to start lifting things again:  [place] with your knees.  Keep your back straight.  Hold heavy objects close to your body.  If you are overweight, ask your health care provider to help you start a weight-loss program.  When you are able to start exercising, ask your health care provider how much and what type of exercise is best for you.  Work with a physical therapist on stretching and strengthening exercises for your back.  Do not wear high-heeled shoes.  Do not sleep on your belly.  Do not smoke.  Keep all follow-up visits as directed by your health care provider. SEEK MEDICAL CARE IF:  You have back or neck pain that is not getting better after 4 weeks.  You have very bad pain in your back or neck.  You develop numbness, tingling, or weakness along with pain. SEEK IMMEDIATE MEDICAL CARE IF:   You have numbness, tingling, or weakness that makes you unable to use your arms or legs.  You lose control of your bladder or bowels.  You have dizziness or fainting.  You have shortness of breath.  MAKE SURE YOU:   Understand these instructions.  Will watch your condition.  Will get help right away if you are not doing well or get worse. Document Released: 07/09/2000 Document Revised: 11/26/2013 Document Reviewed: 06/15/2013 [facility] Patient Information 2015 [place], [place]. This information  is not intended to replace advice given to you by your health care provider. Make sure you discuss any questions you have with your health care provider.     Emergency Department Resource Guide 1) Find a Doctor and Pay Out of Pocket Although you won't have to find out who is covered by your insurance plan, it is a good idea to ask around and get recommendations. You will then need to call the office and see if the doctor you have chosen will accept you as a new patient and what types of options they offer for patients who are self-pay. Some doctors offer discounts or will set up payment plans for their patients who do not have insurance, but you will need to ask so you aren't surprised  when you get to your appointment.  2) Contact Your Local Health Department Not all health departments have doctors that can see patients for sick visits, but many do, so it is worth a call to see if yours does. If you don't know where your local health department is, you can check in your phone book. The CDC also has a tool to help you locate your state's health department, and many state websites also have listings of all of their local health departments.  3) Find a [place] [facility] If your illness is not likely to be very severe or complicated, you may want to try a walk in clinic. These are popping up all over the country in pharmacies, drugstores, and shopping centers. They're usually staffed by nurse practitioners or physician assistants that have been trained to treat common illnesses and complaints. They're usually fairly quick and inexpensive. However, if you have serious medical issues or chronic medical problems, these are probably not your best option.  No Primary Care Doctor: - Call Health Connect at  [phone] - they can help you locate a primary care doctor that  accepts your insurance, provides certain services, etc. - Physician Referral Service- [phone]  Chronic Pain Problems: Organization          Address  Phone   Notes  [facility]  [phone] Patients need to be referred by their primary care doctor.   Medication Assistance: Organization         Address  Phone   Notes  [facility] Medication [facility] [address] [place], [place] [address] [phone] --Must be a resident of [facility] -- Must have NO insurance coverage whatsoever (no Medicaid/ Medicare, etc.) -- The pt. MUST have a primary care doctor that directs their care regularly and follows them in the community   Med[facility]ist  [phone]   [employer]  [phone]    Agencies that provide inexpensive medical care: Organization         Address  Phone   Notes  [place]  [phone]   [facility]    [phone]   [facility] [place], [place] [address] [phone]   [place] [address] [place] [phone]   Planned Parenthood    [phone]   [facility]    [phone]   [place] and [address], [place] Phone:  [phone], Fax:  [phone] Hours of Operation:  9 am - 6 pm, M-F.  Also accepts Medicaid/Medicare and self-pay.  [facility] for [place] [address], [address], [place] Phone: [phone], Fax: [phone]. Hours of Operation:  8:30 am - 5:30 pm, M-F.  Also accepts Medicaid and self-pay.  [facility] [place] [address], [place] Phone: [phone]   [place], [place], [place] [phone], Ext. [phone] Mondays & Thursdays: 7-9 AM.  First 15 patients are seen on a first come, first serve basis.    [place] Providers:  Organization         Address  Phone   Notes  [facility] [address], [place] [phone] Also accepts self-pay patients.   [place], [place]  [phone]   [place]  [place], [address], [place] [phone]   [place] [address], [place] [phone]   Lucianne Lei [address], [place]   [phone] Only accepts [place] Access [place] patients after they have their name applied to their card.   Self-Pay (no insurance) in [facility]:  Organization         Address  Phone   Notes  Sickle Cell Patients, [facility] Internal Medicine [place] [phone]   [facility] [place] [phone]   [facility] [place]  [place], [place], [place] [phone]   [facility]/Dr. Osei-Bonsu  [address], [place] or [address], [place] [phone] Phone number for both [place] and [place] locations is the same.  Urgent Medical and [facility] [address] [place] [phone]   [facility] [address] [place] or [address] [phone]   [facility] [address], [place] [phone], phone; [phone], fax Sees patients 1st and 3rd Saturday of every month.  Must not qualify for public or private insurance (i.e. Medicaid, Medicare, [place] Health Choice, Veterans' Benefits)  Household income should be no more than 200% of the poverty level The clinic cannot treat you if you are pregnant or think you are pregnant  Sexually transmitted diseases are not treated at the clinic.    Dental Care: Organization         Address  Phone  Notes  [facility] Department of [facility] [place] [phone] Accepts children up to age 4 who are enrolled in [place] or [place]; pregnant women with a Medicaid  card; and children who have applied for Medicaid or [place] Health Choice, but were declined, whose parents can pay a reduced fee at time of service.  [facility] Department of [facility]  [address], [place] [phone] Accepts children up to age 41 who are enrolled in [place] or [place]; pregnant women with a Medicaid card; and children who have applied for Medicaid or [place] Health Choice, but were declined, whose parents can pay a reduced fee at time of service.  [place] Adult Dental Access PROGRAM  [place] [phone] Patients are seen by appointment only. Walk-ins are not accepted. [place] will see patients 61 years of age and older. Monday - Tuesday (8am-5pm) Most Wednesdays (8:30-5pm) $30 per visit, cash only  [facility] Adult Dental Access PROGRAM  [address], [facility] [phone] Patients are seen by appointment only. Walk-ins are not accepted. [place] will see patients 20 years of age and older. One Wednesday Evening (Monthly: Volunteer Based).  $30 per visit, cash only  [place]  [phone] for adults; Children under age 40, call Graduate Pediatric Dentistry at [phone]. Children aged 42-14, please call [phone] to request a pediatric application.  Dental services are provided in all areas of dental care including fillings, crowns and bridges, complete and partial dentures, implants, gum treatment, root canals, and extractions. Preventive care is also provided. Treatment is provided to both adults and children. Patients are selected via a lottery and there is often a waiting list.   [phone]  [facility] [address] [place]  [phone] [email or web address]   [facility] [address] [place], [place] [phone], Ext. [phone] Second and Fourth Thursday of each month, opens at 6:30 AM; Clinic ends at 9 AM.  Patients are seen on a first-come  first-served basis, and a limited number are seen during each clinic.   [facility]  [address] Hillard Danker [place], [place] [phone]   Eligibility Requirements You must have lived in [place], [place], or [place] counties for at least the last three months.   You cannot be eligible for state or federal sponsored [employer], including [employer], [place], or [employer].   You generally cannot be eligible for healthcare insurance through your employer.    How to apply: Eligibility screenings are held every Tuesday and Wednesday afternoon from 1:00 pm until 4:00 pm. You do not need an appointment for the interview!  [facility] [address] [place], [place]   [place]  [place] Department  [place]  [phone]    Behavioral Health Resources in the Community: Intensive Outpatient Programs Organization         Address  Phone  Notes  [place]. [address] [place], [place] [phone]   [facility] Outpatient [address] [place], [place]   ADS: Alcohol & Drug Svcs [address], [place], [place]   [place] [address]  [place], [place] or [phone]   Substance Abuse Resources Organization         Address  Phone  Notes  Alcohol and Drug Services  [phone]   [place]  [phone]   [facility] [place]   Chinita Pester  [phone]   Residential & Outpatient Substance Abuse Program  [phone]   Psychological Services Organization         Address  Phone  Notes  [facility] [place]  [place]  [phone]   [place] [address], [place] or [phone]    Mobile Crisis Teams Organization          Address  Phone  Notes  [facility], Mobile Crisis Care Unit  [phone]   Assertive Psychotherapeutic Services  [address] [place], [place]   [place] [address], [place] [phone]    Self-Help/Support Groups Organization         Address  Phone             Notes  [place]. of [place] - variety of support groups  [place] Call for more information  Narcotics Anonymous (NA), [facility] [address], [employer] [place]  2 meetings at this location   Special educational needs teacher         Address  Phone  Notes  [facility] Residential Treatment [place],    [place]  [phone]   [facility]  [address], [place] [address]34196, [place], [place]   [place], [place] [phone] Admissions: 8am-3pm M-F  Incentives Substance [place] [address]    [place], [place] [phone]   [facility] [address] [place], [place], [place]   The [place].,  [place], [place]   [facility] Programs - Intensive Outpatient [address] Kristeen [address], [place], [place]   [facility] ([place] [address]  [place], [place] [phone] or [phone]   [facility] Services (RTS) [address], [place], [place] Accepts Medicaid  [facility] [place] [address],  [employer] [place] [phone] Substance Abuse/Addiction Treatment   [facility] Organization         Address  Phone  Notes  [facility]  [phone]   Domenic Schwab, PhD [address] Arlis [place], [place]   [phone] or [phone]   [place], [place] [phone]   [facility] [address] [place], [place] [phone] Insurance/Medicaid/sponsorship  through [facility] and Families [address] [place]                                    [place], [place] [phone] [place] [address], [place] [phone]    Dr. Adele Schilder  [phone]   [facility] [place] Dept. 1) [address], [place] 2) [place] 3)  [place] [address], [place] [phone]  [phone]   [place] [phone] or [phone] (After Hours)

## 2014-05-01 ENCOUNTER — Ambulatory Visit: Payer: Self-pay | Attending: Internal Medicine

## 2014-05-02 ENCOUNTER — Telehealth: Payer: Self-pay | Admitting: Internal Medicine

## 2014-05-02 NOTE — Telephone Encounter (Signed)
Patient's sister has come in today to request a referral for her brother; patient has not been seen here since June; please f/u with patient about his request

## 2014-05-03 ENCOUNTER — Emergency Department (HOSPITAL_COMMUNITY)
Admission: EM | Admit: 2014-05-03 | Discharge: 2014-05-03 | Disposition: A | Discharge status: Home / Self Care | Payer: Self-pay | Attending: Emergency Medicine | Admitting: Emergency Medicine

## 2014-05-03 ENCOUNTER — Encounter (HOSPITAL_COMMUNITY): Payer: Self-pay | Admitting: Emergency Medicine

## 2014-05-03 DIAGNOSIS — Z720 Tobacco use: Secondary | ICD-10-CM | POA: Insufficient documentation

## 2014-05-03 DIAGNOSIS — Z7952 Long term (current) use of systemic steroids: Secondary | ICD-10-CM | POA: Insufficient documentation

## 2014-05-03 DIAGNOSIS — Z791 Long term (current) use of non-steroidal anti-inflammatories (NSAID): Secondary | ICD-10-CM | POA: Insufficient documentation

## 2014-05-03 DIAGNOSIS — Z79899 Other long term (current) drug therapy: Secondary | ICD-10-CM | POA: Insufficient documentation

## 2014-05-03 DIAGNOSIS — L02413 Cutaneous abscess of right upper limb: Secondary | ICD-10-CM | POA: Insufficient documentation

## 2014-05-03 DIAGNOSIS — Z8739 Personal history of other diseases of the musculoskeletal system and connective tissue: Secondary | ICD-10-CM | POA: Insufficient documentation

## 2014-05-03 DIAGNOSIS — L0390 Cellulitis, unspecified: Secondary | ICD-10-CM

## 2014-05-03 DIAGNOSIS — L0291 Cutaneous abscess, unspecified: Secondary | ICD-10-CM

## 2014-05-03 DIAGNOSIS — Z7982 Long term (current) use of aspirin: Secondary | ICD-10-CM | POA: Insufficient documentation

## 2014-05-03 DIAGNOSIS — I10 Essential (primary) hypertension: Secondary | ICD-10-CM | POA: Insufficient documentation

## 2014-05-03 HISTORY — DX: Other intervertebral disc displacement, lumbar region: M51.26

## 2014-05-03 LAB — CBC WITH DIFFERENTIAL/PLATELET
Basophils Absolute: 0 10*3/uL (ref 0.0–0.1)
Basophils Relative: 0 % (ref 0–1)
EOS ABS: 0.1 10*3/uL (ref 0.0–0.7)
EOS PCT: 1 % (ref 0–5)
HCT: 37.7 % — ABNORMAL LOW (ref 39.0–52.0)
HEMOGLOBIN: 12.8 g/dL — AB (ref 13.0–17.0)
LYMPHS ABS: 1.8 10*3/uL (ref 0.7–4.0)
Lymphocytes Relative: 14 % (ref 12–46)
MCH: 29.9 pg (ref 26.0–34.0)
MCHC: 34 g/dL (ref 30.0–36.0)
MCV: 88.1 fL (ref 78.0–100.0)
MONOS PCT: 6 % (ref 3–12)
Monocytes Absolute: 0.8 10*3/uL (ref 0.1–1.0)
Neutro Abs: 10.3 10*3/uL — ABNORMAL HIGH (ref 1.7–7.7)
Neutrophils Relative %: 79 % — ABNORMAL HIGH (ref 43–77)
Platelets: 257 10*3/uL (ref 150–400)
RBC: 4.28 MIL/uL (ref 4.22–5.81)
RDW: 13 % (ref 11.5–15.5)
WBC: 13.1 10*3/uL — ABNORMAL HIGH (ref 4.0–10.5)

## 2014-05-03 LAB — CBG MONITORING, ED: Glucose-Capillary: 107 mg/dL — ABNORMAL HIGH (ref 70–99)

## 2014-05-03 MED ORDER — CEPHALEXIN 250 MG PO CAPS
500.0000 mg | ORAL_CAPSULE | Freq: Once | ORAL | Status: AC
  Administered 2014-05-03: 500 mg via ORAL
  Filled 2014-05-03: qty 2

## 2014-05-03 MED ORDER — LIDOCAINE-EPINEPHRINE 2 %-1:100000 IJ SOLN
20.0000 mL | Freq: Once | INTRAMUSCULAR | Status: DC
  Filled 2014-05-03: qty 20

## 2014-05-03 MED ORDER — LIDOCAINE HCL (PF) 1 % IJ SOLN
INTRAMUSCULAR | Status: AC
  Filled 2014-05-03: qty 10

## 2014-05-03 MED ORDER — HYDROCODONE-ACETAMINOPHEN 5-325 MG PO TABS: ORAL_TABLET | ORAL | Status: DC

## 2014-05-03 MED ORDER — SULFAMETHOXAZOLE-TRIMETHOPRIM 800-160 MG PO TABS: 1.0000 | ORAL_TABLET | Freq: Two times a day (BID) | ORAL | Status: DC

## 2014-05-03 MED ORDER — HYDROCODONE-ACETAMINOPHEN 5-325 MG PO TABS
1.0000 | ORAL_TABLET | Freq: Once | ORAL | Status: AC
  Administered 2014-05-03: 1 via ORAL
  Filled 2014-05-03: qty 1

## 2014-05-03 MED ORDER — CEPHALEXIN 500 MG PO CAPS: 500.0000 mg | ORAL_CAPSULE | Freq: Four times a day (QID) | ORAL | Status: DC

## 2014-05-03 MED ORDER — SULFAMETHOXAZOLE-TMP DS 800-160 MG PO TABS
1.0000 | ORAL_TABLET | Freq: Once | ORAL | Status: AC
  Administered 2014-05-03: 1 via ORAL
  Filled 2014-05-03: qty 1

## 2014-05-03 NOTE — ED Notes (Signed)
Pt reports having abscess to right forearm x 1 month.

## 2014-05-03 NOTE — ED Notes (Signed)
Pt stepped out for a moment to speak with spouse; stated he will return.

## 2014-05-03 NOTE — ED Provider Notes (Signed)
CSN: [id number]     Arrival date & time 05/03/14  1345 History   First MD Initiated Contact with Patient 05/03/14 1404     Chief Complaint  Patient presents with  . Abscess     (Consider location/radiation/quality/duration/timing/severity/associated sxs/prior Treatment) HPI  Calvin [name] is a 53 y.o. male with past medical history significant for hypertension complaining of abscess to right forearm worsening over the course of the month. Patient denies diabetes, fever, chills, nausea, vomiting, similar prior abscesses. Denies any trauma to the affected area. Patient states that he is taking Medrol dose pack for low back pain.  Past Medical History  Diagnosis Date  . Hypertension   . Lumbar herniated disc    Past Surgical History  Procedure Laterality Date  . Foot surgery      right, ~ 2002   Family History  Problem Relation Age of Onset  . Diabetes Mother   . Hypertension Mother   . Heart disease Mother   . Hyperlipidemia Mother   . Diabetes Brother   . Diabetes Daughter   . Hypertension Sister   . Diabetes Sister    History  Substance Use Topics  . Smoking status: Current Every Day Smoker -- 0.50 packs/day for 20 years    Types: Cigarettes  . Smokeless tobacco: Not on file  . Alcohol Use: Yes     Comment: occasional    Review of Systems  10 systems reviewed and found to be negative, except as noted in the HPI.   Allergies  Review of patient's allergies indicates no known allergies.  Home Medications   Prior to Admission medications   Medication Sig Start Date End Date Taking? Authorizing Provider  amLODipine (NORVASC) 2.5 MG tablet Take 1 tablet (2.5 mg total) by mouth daily. 01/07/14   Lorayne Marek, MD  Aspirin-Acetaminophen (GOODY BODY PAIN) 500-325 MG PACK Take 2 packets by mouth every 6 (six) hours as needed (pain).    Historical Provider, MD  cephALEXin (KEFLEX) 500 MG capsule Take 1 capsule (500 mg total) by mouth 4 (four) times daily. 05/03/14   Felis Quillin, PA-C  cyclobenzaprine (FLEXERIL) 10 MG tablet Take 10 mg by mouth 3 (three) times daily as needed for muscle spasms.    Historical Provider, MD  diclofenac (VOLTAREN) 75 MG EC tablet Take 75 mg by mouth 2 (two) times daily.    Historical Provider, MD  Diphenhydramine-APAP, sleep, (EXCEDRIN PM PO) Take 2 tablets by mouth at bedtime as needed (for sleep).    Historical Provider, MD  HYDROcodone-acetaminophen (NORCO/VICODIN) 5-325 MG per tablet Take 2 tablets by mouth every 4 (four) hours as needed for moderate pain. 10/22/13   Fransico Meadow, PA-C  HYDROcodone-acetaminophen (NORCO/VICODIN) 5-325 MG per tablet Take 1-2 tablets by mouth every 6 hours as needed for pain. 05/03/14   Lequisha Cammack, PA-C  ibuprofen (ADVIL,MOTRIN) 200 MG tablet Take 800 mg by mouth every 6 (six) hours as needed for mild pain.    Historical Provider, MD  ibuprofen (ADVIL,MOTRIN) 800 MG tablet Take 1 tablet (800 mg total) by mouth every 8 (eight) hours as needed for mild pain. 04/30/14   Kristen N Ward, DO  methylPREDNIsolone (MEDROL DOSPACK) 4 MG tablet follow package directions 04/30/14   Kristen N Ward, DO  oxyCODONE-acetaminophen (PERCOCET/ROXICET) 5-325 MG per tablet Take 1 tablet by mouth every 4 (four) hours as needed. 04/30/14   Kristen N Ward, DO  sulfamethoxazole-trimethoprim (SEPTRA DS) 800-160 MG per tablet Take 1 tablet by mouth every 12 (twelve)  hours. 05/03/14   Zitlali Primm, PA-C   BP 133/92  Pulse 70  Temp(Src) 98.2 F (36.8 C) (Oral)  Resp 16  SpO2 97% Physical Exam  Nursing note and vitals reviewed. Constitutional: He is oriented to person, place, and time. He appears well-developed and well-nourished. No distress.  HENT:  Head: Normocephalic and atraumatic.  Mouth/Throat: Oropharynx is clear and moist.  Eyes: Conjunctivae and EOM are normal. Pupils are equal, round, and reactive to light.  Neck: Normal range of motion.  Cardiovascular: Normal rate, regular rhythm and intact distal  pulses.   Pulmonary/Chest: Effort normal and breath sounds normal. No stridor. No respiratory distress. He has no wheezes. He has no rales. He exhibits no tenderness.  Abdominal: Soft. Bowel sounds are normal. He exhibits no distension and no mass. There is no tenderness. There is no rebound and no guarding.  Musculoskeletal: Normal range of motion.  Neurological: He is alert and oriented to person, place, and time.  Skin:  1 cm fluctuant actively draining abscess to right forearm, there is 2cm surrounding cellulitis.  Psychiatric: He has a normal mood and affect.      ED Course  INCISION AND DRAINAGE Date/Time: 05/03/2014 4:28 PM Performed by: Monico Blitz Authorized by: Monico Blitz Consent: Verbal consent not obtained. Risks and benefits: risks, benefits and alternatives were discussed Consent given by: patient Patient identity confirmed: verbally with patient Time out: Immediately prior to procedure a "time out" was called to verify the correct patient, procedure, equipment, support staff and site/side marked as required. Type: abscess Local anesthetic: lidocaine 1% without epinephrine Anesthetic total: 3 ml Scalpel size: 11 Incision type: elliptical Drainage: purulent Drainage amount: moderate Packing material: 1/4 in iodoform gauze Patient tolerance: Patient tolerated the procedure well with no immediate complications.   (including critical care time) Labs Review Labs Reviewed  CBC WITH DIFFERENTIAL - Abnormal; Notable for the following:    WBC 13.1 (*)    Hemoglobin 12.8 (*)    HCT 37.7 (*)    Neutrophils Relative % 79 (*)    Neutro Abs 10.3 (*)    All other components within normal limits  CBG MONITORING, ED - Abnormal; Notable for the following:    Glucose-Capillary 107 (*)    All other components within normal limits    Imaging Review No results found.   EKG Interpretation None      MDM   Final diagnoses:  Abscess and cellulitis    Filed  Vitals:   05/03/14 1401 05/03/14 1614  BP: 125/91 133/92  Pulse: 75 70  Temp: 98.7 F (37.1 C) 98.2 F (36.8 C)  TempSrc: Oral Oral  Resp: 18 16  SpO2: 95% 97%    Medications  lidocaine-EPINEPHrine (XYLOCAINE W/EPI) 2 %-1:100000 (with pres) injection 20 mL (not administered)  lidocaine (PF) (XYLOCAINE) 1 % injection (not administered)  sulfamethoxazole-trimethoprim (BACTRIM DS) 800-160 MG per tablet 1 tablet (1 tablet Oral Given 05/03/14 1525)  HYDROcodone-acetaminophen (NORCO/VICODIN) 5-325 MG per tablet 1 tablet (1 tablet Oral Given 05/03/14 1525)  cephALEXin (KEFLEX) capsule 500 mg (500 mg Oral Given 05/03/14 1525)    Calvin [name] is a 53 y.o. male presenting with Abscess to right forearm. Vital signs with no abnormality. Patient is not diabetic but he is taking steroids for lumbar strain. I&D performed and wound is packed. Patient has normal blood glucose, mild leukocytosis of 13.1. Wound care instructions given.  Evaluation does not show pathology that would require ongoing emergent intervention or inpatient treatment. Pt is hemodynamically stable  and mentating appropriately. Discussed findings and plan with patient/guardian, who agrees with care plan. All questions answered. Return precautions discussed and outpatient follow up given.   Discharge Medication List as of 05/03/2014  4:10 PM    START taking these medications   Details  cephALEXin (KEFLEX) 500 MG capsule Take 1 capsule (500 mg total) by mouth 4 (four) times daily., Starting 05/03/2014, Until Discontinued, Print    !! HYDROcodone-acetaminophen (NORCO/VICODIN) 5-325 MG per tablet Take 1-2 tablets by mouth every 6 hours as needed for pain., Print    sulfamethoxazole-trimethoprim (SEPTRA DS) 800-160 MG per tablet Take 1 tablet by mouth every 12 (twelve) hours., Starting 05/03/2014, Until Discontinued, Print     !! - Potential duplicate medications found. Please discuss with provider.           Monico Blitz,  PA-C 05/03/14 1630

## 2014-05-03 NOTE — Discharge Instructions (Signed)
Return to the emergency room for wound check and packing removal in 48 hours.        If you develop fever, have vomiting or if the swelling and redness starts spreading , return to the emergency room immediately for a recheck.  Take your antibiotics as directed and to completion. You should never have any leftover antibiotics! Push fluids and stay well hydrated.   Take vicodin for breakthrough pain, do not drink alcohol, drive, care for children or do other critical tasks while taking vicodin.  Please follow with your primary care doctor in the next 2 days for a check-up. They must obtain records for further management.   Do not hesitate to return to the Emergency Department for any new, worsening or concerning symptoms.    Abscess Care After An abscess (also called a boil or furuncle) is an infected area that contains a collection of pus. Signs and symptoms of an abscess include pain, tenderness, redness, or hardness, or you may feel a moveable soft area under your skin. An abscess can occur anywhere in the body. The infection may spread to surrounding tissues causing cellulitis. A cut (incision) by the surgeon was made over your abscess and the pus was drained out. Gauze may have been packed into the space to provide a drain that will allow the cavity to heal from the inside outwards. The boil may be painful for 5 to 7 days. Most people with a boil do not have high fevers. Your abscess, if seen early, may not have localized, and may not have been lanced. If not, another appointment may be required for this if it does not get better on its own or with medications. HOME CARE INSTRUCTIONS   Only take over-the-counter or prescription medicines for pain, discomfort, or fever as directed by your caregiver.  When you bathe, soak and then remove gauze or iodoform packs at least daily or as directed by your caregiver. You may then wash the wound gently with mild soapy water. Repack with gauze or do as  your caregiver directs. SEEK IMMEDIATE MEDICAL CARE IF:   You develop increased pain, swelling, redness, drainage, or bleeding in the wound site.  You develop signs of generalized infection including muscle aches, chills, fever, or a general ill feeling.  An oral temperature above 102 F (38.9 C) develops, not controlled by medication. See your caregiver for a recheck if you develop any of the symptoms described above. If medications (antibiotics) were prescribed, take them as directed. Document Released: 01/28/2005 Document Revised: 10/04/2011 Document Reviewed: 09/25/2007 [facility] Patient Information 2015 [place], [place]. This information is not intended to replace advice given to you by your health care provider. Make sure you discuss any questions you have with your health care provider.

## 2014-05-03 NOTE — ED Provider Notes (Signed)
Medical screening examination/treatment/procedure(s) were performed by non-physician practitioner and as supervising physician I was immediately available for consultation/collaboration.   EKG Interpretation None        Debby Freiberg, MD 05/03/14 [phone]

## 2014-05-05 ENCOUNTER — Encounter (HOSPITAL_COMMUNITY): Payer: Self-pay | Admitting: Emergency Medicine

## 2014-05-05 ENCOUNTER — Emergency Department (HOSPITAL_COMMUNITY)
Admission: EM | Admit: 2014-05-05 | Discharge: 2014-05-05 | Disposition: A | Discharge status: Home / Self Care | Payer: Self-pay | Attending: Emergency Medicine | Admitting: Emergency Medicine

## 2014-05-05 DIAGNOSIS — Z791 Long term (current) use of non-steroidal anti-inflammatories (NSAID): Secondary | ICD-10-CM | POA: Insufficient documentation

## 2014-05-05 DIAGNOSIS — I10 Essential (primary) hypertension: Secondary | ICD-10-CM | POA: Insufficient documentation

## 2014-05-05 DIAGNOSIS — Z79899 Other long term (current) drug therapy: Secondary | ICD-10-CM | POA: Insufficient documentation

## 2014-05-05 DIAGNOSIS — L02413 Cutaneous abscess of right upper limb: Secondary | ICD-10-CM

## 2014-05-05 DIAGNOSIS — Z792 Long term (current) use of antibiotics: Secondary | ICD-10-CM | POA: Insufficient documentation

## 2014-05-05 DIAGNOSIS — Z4801 Encounter for change or removal of surgical wound dressing: Secondary | ICD-10-CM | POA: Insufficient documentation

## 2014-05-05 DIAGNOSIS — Z720 Tobacco use: Secondary | ICD-10-CM | POA: Insufficient documentation

## 2014-05-05 DIAGNOSIS — Z8739 Personal history of other diseases of the musculoskeletal system and connective tissue: Secondary | ICD-10-CM | POA: Insufficient documentation

## 2014-05-05 MED ORDER — ONDANSETRON HCL 4 MG PO TABS: 4.0000 mg | ORAL_TABLET | Freq: Four times a day (QID) | ORAL | Status: DC

## 2014-05-05 MED ORDER — CLINDAMYCIN HCL 150 MG PO CAPS: 150.0000 mg | ORAL_CAPSULE | Freq: Four times a day (QID) | ORAL | Status: DC

## 2014-05-05 NOTE — ED Notes (Signed)
He was here Friday to have abscess drained and packed. Was told to return today for packing removal. He states the abscess does not feel better or worse and pain remains constant at 8/10

## 2014-05-05 NOTE — Discharge Instructions (Signed)
Incision and Drainage Incision and drainage is a procedure in which a sac-like structure (cystic structure) is opened and drained. The area to be drained usually contains material such as pus, fluid, or blood.  LET YOUR CAREGIVER KNOW ABOUT:   Allergies to medicine.  Medicines taken, including vitamins, herbs, eyedrops, over-the-counter medicines, and creams.  Use of steroids (by mouth or creams).  Previous problems with anesthetics or numbing medicines.  History of bleeding problems or blood clots.  Previous surgery.  Other health problems, including diabetes and kidney problems.  Possibility of pregnancy, if this applies. RISKS AND COMPLICATIONS  Pain.  Bleeding.  Scarring.  Infection. BEFORE THE PROCEDURE  You may need to have an ultrasound or other imaging tests to see how large or deep your cystic structure is. Blood tests may also be used to determine if you have an infection or how severe the infection is. You may need to have a tetanus shot. PROCEDURE  The affected area is cleaned with a cleaning fluid. The cyst area will then be numbed with a medicine (local anesthetic). A small incision will be made in the cystic structure. A syringe or catheter may be used to drain the contents of the cystic structure, or the contents may be squeezed out. The area will then be flushed with a cleansing solution. After cleansing the area, it is often gently packed with a gauze or another wound dressing. Once it is packed, it will be covered with gauze and tape or some other type of wound dressing. AFTER THE PROCEDURE   Often, you will be allowed to go home right after the procedure.  You may be given antibiotic medicine to prevent or heal an infection.  If the area was packed with gauze or some other wound dressing, you will likely need to come back in 1 to 2 days to get it removed.  The area should heal in about 14 days. Document Released: 01/05/2001 Document Revised: 01/11/2012  Document Reviewed: 09/06/2011 [facility] Patient Information 2015 [place], [place]. This information is not intended to replace advice given to you by your health care provider. Make sure you discuss any questions you have with your health care provider.  MRSA Infection MRSA stands for methicillin-resistant Staphylococcus aureus. This type of infection is caused by Staphylococcus aureus bacteria that are no longer affected by the medicines used to kill them (drug resistant). Staphylococcus (staph) bacteria are normally found on the skin or in the nose of healthy people. In most cases, these bacteria do not cause infection. But if these resistant bacteria enter your body through a cut or sore, they can cause a serious infection on your skin or in other parts of your body. There is a slight chance that the staph on your skin or in your nose is MRSA. There are two types of MRSA infections:  Hospital-acquired MRSA is bacteria that you get in the hospital.  Community-acquired MRSA is bacteria that you get somewhere other than in a hospital. RISK FACTORS Hospital-acquired MRSA is more common. You could be at risk for this infection if you are in the hospital and you:  Have surgery or a procedure.  Have an IV access or a catheter tube placed in your body.  Have weak resistance to germs (weakened immune system).  Are elderly.  Are on kidney dialysis. You could be at risk for community-acquired MRSA if you have a break in your skin and come into contact with MRSA. This may happen if you:  Play sports  where there is skin-to-skin contact.  Live in a crowded setting, like a dormitory or a [employer]  Share towels, razors, or sports equipment with other people. SYMPTOMS  Symptoms of hospital-acquired MRSA depend on where MRSA has spread. Symptoms may include:  Wound infection.  Skin infection.  Rash.  Pneumonia.  Fever and chills.  Difficulty breathing.  Chest  pain. Community-acquired MRSA is most likely to start as a scratch or cut that becomes infected. Symptoms may include:  A pus-filled pimple.  A boil on your skin.  Pus draining from your skin.  A sore (abscess) under your skin or somewhere in your body.  Fever with or without chills. DIAGNOSIS  The diagnosis of MRSA is made by taking a sample from an infected area and sending it to a lab for testing. A lab technician can grow (culture) MRSA and check it under a microscope. The cultured MRSA can be tested to see which type of antibiotic medicine will work to treat it. Newer tests can identify MRSA more quickly by testing bacteria samples for MRSA genes. Your health care provider can diagnose MRSA using samples from:   Cuts or wounds in infected areas.  Nasal swabs.  Saliva or cough specimens from deep in the lungs (sputum).  Urine.  Blood. You may also have:  Imaging studies (such as X-ray or MRI) to check if the infection has spread to the lungs, bones, or joints.  A culture and sensitivity test of blood or fluids from inside the joints. TREATMENT  Treatment depends on how severe, deep, or extensive the infection is. Very bad infections may require a hospital stay.  Some skin infections, such as a small boil or sore (abscess), may be treated by draining pus from the site of the infection.  More extensive surgery to drain pus may be necessary for deeper or more widespread soft tissue infections.  You may then have to take antibiotic medicine given by mouth or through a vein. You may start antibiotic treatment right away or after testing can be done to see what antibiotic medicine should be used. HOME CARE INSTRUCTIONS   Take your antibiotics as directed by your health care provider. Take the medicine as prescribed until it is finished.  Avoid close contact with those around you as much as possible. Do not use towels, razors, toothbrushes, bedding, or other items that will be  used by others.  Wash your hands frequently for 15 seconds with soap and water. Dry your hands with a clean or disposable towel.  When you are not able to wash your hands, use hand sanitizer that is more than 60 percent alcohol.  Wash towels, sheets, or clothes in the washing machine with detergent and hot water. Dry them in a hot dryer.  Follow your health care provider's instructions for wound care. Wash your hands before and after changing your bandages.  Always shower after exercising.  Keep all cuts and scrapes clean and covered with a bandage.  Be sure to tell all your health care providers that you have MRSA so they are aware of your infection. SEEK MEDICAL CARE IF:  You have a cut, scrape, pimple, or boil that becomes red, swollen, or painful or has pus in it.  You have pus draining from your skin.  You have an abscess under your skin or somewhere in your body. SEEK IMMEDIATE MEDICAL CARE IF:   You have symptoms of a skin infection with a fever or chills.  You have trouble  breathing.  You have chest pain.  You have a skin wound and you become nauseous or start vomiting. MAKE SURE YOU:  Understand these instructions.  Will watch your condition.  Will get help right away if you are not doing well or get worse. Document Released: 07/12/2005 Document Revised: 07/17/2013 Document Reviewed: 05/04/2013 [facility] Patient Information 2015 [place], [place]. This information is not intended to replace advice given to you by your health care provider. Make sure you discuss any questions you have with your health care provider.

## 2014-05-05 NOTE — ED Provider Notes (Signed)
Medical screening examination/treatment/procedure(s) were performed by non-physician practitioner and as supervising physician I was immediately available for consultation/collaboration.   EKG Interpretation None        Ezequiel Essex, MD 05/05/14 1758

## 2014-05-05 NOTE — ED Provider Notes (Signed)
CSN: [id number]     Arrival date & time 05/05/14  1253 History  This chart was scribed for non-physician practitioner Delos Haring, PA-C working with Ezequiel Essex, MD by Lora Havens, ED Scribe. This patient was seen in TR11C/TR11C and the patient's care was started at 1:42 PM.  Chief Complaint  Patient presents with  . Follow-up   The history is provided by the patient.   HPI Comments: Calvin [name] is a 53 y.o. male who presents to the Emergency Department for a follow-up for an abscess drainage that occured two days ago and was told to return today. He was given Keflex and Bactrim which the pt notes provided mild relief but it still hurts. Pt notes the swelling has lessened but the pain is constant but improved. He has drainage as an associated symptom. Pt denies nausea, diarrhea, and vomiting. He otherwise feels well. Normal vital signs in triage. Hx of Hep A, B and C, hypertension   Past Medical History  Diagnosis Date  . Hypertension   . Lumbar herniated disc    Past Surgical History  Procedure Laterality Date  . Foot surgery      right, ~ 2002   Family History  Problem Relation Age of Onset  . Diabetes Mother   . Hypertension Mother   . Heart disease Mother   . Hyperlipidemia Mother   . Diabetes Brother   . Diabetes Daughter   . Hypertension Sister   . Diabetes Sister    History  Substance Use Topics  . Smoking status: Current Every Day Smoker -- 0.50 packs/day for 20 years    Types: Cigarettes  . Smokeless tobacco: Not on file  . Alcohol Use: Yes     Comment: occasional    Review of Systems  Gastrointestinal: Negative for nausea, vomiting and diarrhea.  Skin: Positive for wound.       Abscess with discharge   All other systems reviewed and are negative.  Allergies  Review of patient's allergies indicates no known allergies.  Home Medications   Prior to Admission medications   Medication Sig Start Date End Date Taking? Authorizing Provider  amLODipine  (NORVASC) 2.5 MG tablet Take 1 tablet (2.5 mg total) by mouth daily. 01/07/14   Lorayne Marek, MD  Aspirin-Acetaminophen (GOODY BODY PAIN) 500-325 MG PACK Take 2 packets by mouth every 6 (six) hours as needed (pain).    Historical Provider, MD  clindamycin (CLEOCIN) 150 MG capsule Take 1 capsule (150 mg total) by mouth every 6 (six) hours. 05/05/14   Jeana Kersting Marilu Favre, PA-C  cyclobenzaprine (FLEXERIL) 10 MG tablet Take 10 mg by mouth 3 (three) times daily as needed for muscle spasms.    Historical Provider, MD  diclofenac (VOLTAREN) 75 MG EC tablet Take 75 mg by mouth 2 (two) times daily.    Historical Provider, MD  Diphenhydramine-APAP, sleep, (EXCEDRIN PM PO) Take 2 tablets by mouth at bedtime as needed (for sleep).    Historical Provider, MD  HYDROcodone-acetaminophen (NORCO/VICODIN) 5-325 MG per tablet Take 2 tablets by mouth every 4 (four) hours as needed for moderate pain. 10/22/13   Fransico Meadow, PA-C  HYDROcodone-acetaminophen (NORCO/VICODIN) 5-325 MG per tablet Take 1-2 tablets by mouth every 6 hours as needed for pain. 05/03/14   Nicole Pisciotta, PA-C  ibuprofen (ADVIL,MOTRIN) 200 MG tablet Take 800 mg by mouth every 6 (six) hours as needed for mild pain.    Historical Provider, MD  ibuprofen (ADVIL,MOTRIN) 800 MG tablet Take 1 tablet (800 mg  total) by mouth every 8 (eight) hours as needed for mild pain. 04/30/14   Kristen N Ward, DO  methylPREDNIsolone (MEDROL DOSPACK) 4 MG tablet follow package directions 04/30/14   Kristen N Ward, DO  ondansetron (ZOFRAN) 4 MG tablet Take 1 tablet (4 mg total) by mouth every 6 (six) hours. 05/05/14   Guido Comp Marilu Favre, PA-C  oxyCODONE-acetaminophen (PERCOCET/ROXICET) 5-325 MG per tablet Take 1 tablet by mouth every 4 (four) hours as needed. 04/30/14   Kristen N Ward, DO   BP 143/93  Pulse 69  Temp(Src) 98.1 F (36.7 C) (Oral)  Resp 18  SpO2 99% Physical Exam  Nursing note and vitals reviewed. Constitutional: He is oriented to person, place, and time. He  appears well-developed and well-nourished. No distress.  HENT:  Head: Normocephalic and atraumatic.  Eyes: EOM are normal.  Neck: Normal range of motion.  Cardiovascular: Normal rate.   Pulmonary/Chest: Effort normal.  Neurological: He is alert and oriented to person, place, and time.  Skin: Skin is warm and dry.  On the right posterior forearm a 1.5 cm opening of an abscess with only mild surrounding cellulitis and tenderness. Small amount of yellow discharge draining from wound.  Psychiatric: He has a normal mood and affect. His behavior is normal.    ED Course  Procedures  DIAGNOSTIC STUDIES: Oxygen Saturation is 99% on room air, normal by my interpretation.    COORDINATION OF CARE: 1:47 PM Will perform a wound culture and will dc keflex and bactrim and change to clindamycin.  Pt agrees to the treatment plan. Will f/u for wound recheck on Wed/Thurs either at PCP or return to the ED. Given supplies to change bandage daily.   Labs Review Labs Reviewed  WOUND CULTURE    Imaging Review No results found.   EKG Interpretation None      MDM   Final diagnoses:  Abscess of arm, right     53 y.o.[name] evaluation in the Emergency Department is complete. It has been determined that no acute conditions requiring further emergency intervention are present at this time. The patient/guardian have been advised of the diagnosis and plan. We have discussed signs and symptoms that warrant return to the ED, such as changes or worsening in symptoms.  Vital signs are stable at discharge. Filed Vitals:   05/05/14 1307  BP: 143/93  Pulse: 69  Temp: 98.1 F (36.7 C)  Resp: 18    Patient/guardian has voiced understanding and agreed to follow-up with the PCP or specialist.  I personally performed the services described in this documentation, which was scribed in my presence. The recorded information has been reviewed and is accurate.    Linus Mako, PA-C 05/05/14  1421

## 2014-05-05 NOTE — ED Notes (Signed)
Declined W/C at D/C and was escorted to lobby by RN. 

## 2014-05-08 LAB — WOUND CULTURE: Culture: NO GROWTH

## 2014-06-04 ENCOUNTER — Encounter: Payer: Self-pay | Admitting: Internal Medicine

## 2014-06-04 ENCOUNTER — Ambulatory Visit: Payer: Self-pay | Attending: Internal Medicine | Admitting: Internal Medicine

## 2014-06-04 ENCOUNTER — Ambulatory Visit (HOSPITAL_BASED_OUTPATIENT_CLINIC_OR_DEPARTMENT_OTHER): Payer: Self-pay

## 2014-06-04 VITALS — BP 150/80 | HR 70 | Temp 98.0°F | Resp 16 | Wt 162.6 lb

## 2014-06-04 DIAGNOSIS — Z23 Encounter for immunization: Secondary | ICD-10-CM

## 2014-06-04 DIAGNOSIS — M5126 Other intervertebral disc displacement, lumbar region: Secondary | ICD-10-CM | POA: Insufficient documentation

## 2014-06-04 DIAGNOSIS — I10 Essential (primary) hypertension: Secondary | ICD-10-CM | POA: Insufficient documentation

## 2014-06-04 DIAGNOSIS — M545 Low back pain, unspecified: Secondary | ICD-10-CM

## 2014-06-04 DIAGNOSIS — F17210 Nicotine dependence, cigarettes, uncomplicated: Secondary | ICD-10-CM | POA: Insufficient documentation

## 2014-06-04 DIAGNOSIS — G8929 Other chronic pain: Secondary | ICD-10-CM

## 2014-06-04 DIAGNOSIS — Z720 Tobacco use: Secondary | ICD-10-CM

## 2014-06-04 DIAGNOSIS — F17200 Nicotine dependence, unspecified, uncomplicated: Secondary | ICD-10-CM

## 2014-06-04 MED ORDER — TRAMADOL HCL 50 MG PO TABS: 50.0000 mg | ORAL_TABLET | Freq: Three times a day (TID) | ORAL | Status: DC | PRN

## 2014-06-04 MED ORDER — AMLODIPINE BESYLATE 5 MG PO TABS: 5.0000 mg | ORAL_TABLET | Freq: Every day | ORAL | Status: DC

## 2014-06-04 NOTE — Progress Notes (Signed)
MRN: [id number] Name: Calvin [name]  Sex: male Age: 53 y.o. DOB: 12-01-1960  Allergies: Review of patient's allergies indicates no known allergies.  Chief Complaint  Patient presents with  . Back Pain    HPI: Patient is 53 y.o. male who has history of hypertension,chronic back pain, patient was following up with sports medicine, he also had a repeat MRI done which reported disc bulge, patient was advised to follow with neurosurgery as per patient is in the process to get insurance, today's blood pressure is elevated as per patient he then out of his blood pressure medication denies any headache dizziness chest pain or shortness of breath, patient does smoke cigarettes, advised patient to quit smoking.  Past Medical History  Diagnosis Date  . Hypertension   . Lumbar herniated disc     Past Surgical History  Procedure Laterality Date  . Foot surgery      right, ~ 2002      Medication List       This list is accurate as of: 06/04/14  5:07 PM.  Always use your most recent med list.               amLODipine 5 MG tablet  Commonly known as:  NORVASC  Take 1 tablet (5 mg total) by mouth daily.     clindamycin 150 MG capsule  Commonly known as:  CLEOCIN  Take 1 capsule (150 mg total) by mouth every 6 (six) hours.     HYDROcodone-acetaminophen 5-325 MG per tablet  Commonly known as:  NORCO/VICODIN  Take 2 tablets by mouth every 4 (four) hours as needed for moderate pain.     ibuprofen 800 MG tablet  Commonly known as:  ADVIL,MOTRIN  Take 1 tablet (800 mg total) by mouth every 8 (eight) hours as needed for mild pain.     ondansetron 4 MG tablet  Commonly known as:  ZOFRAN  Take 1 tablet (4 mg total) by mouth every 6 (six) hours.     oxyCODONE-acetaminophen 5-325 MG per tablet  Commonly known as:  PERCOCET/ROXICET  Take 1 tablet by mouth every 4 (four) hours as needed for severe pain.     traMADol 50 MG tablet  Commonly known as:  ULTRAM  Take 1 tablet (50 mg  total) by mouth every 8 (eight) hours as needed for moderate pain.        Meds ordered this encounter  Medications  . amLODipine (NORVASC) 5 MG tablet    Sig: Take 1 tablet (5 mg total) by mouth daily.    Dispense:  90 tablet    Refill:  3  . traMADol (ULTRAM) 50 MG tablet    Sig: Take 1 tablet (50 mg total) by mouth every 8 (eight) hours as needed for moderate pain.    Dispense:  30 tablet    Refill:  0    Immunization History  Administered Date(s) Administered  . Influenza,inj,Quad PF,36+ Mos 06/04/2014    Family History  Problem Relation Age of Onset  . Diabetes Mother   . Hypertension Mother   . Heart disease Mother   . Hyperlipidemia Mother   . Diabetes Brother   . Diabetes Daughter   . Hypertension Sister   . Diabetes Sister     History  Substance Use Topics  . Smoking status: Current Every Day Smoker -- 0.50 packs/day for 20 years    Types: Cigarettes  . Smokeless tobacco: Not on file  . Alcohol Use: Yes  Comment: occasional    Review of Systems   As noted in HPI  Filed Vitals:   06/04/14 1706  BP: 150/80  Pulse:   Temp:   Resp:     Physical Exam  Physical Exam  Constitutional: No distress.  Eyes: EOM are normal. Pupils are equal, round, and reactive to light.  Cardiovascular: Normal rate and regular rhythm.   Pulmonary/Chest: Breath sounds normal. No respiratory distress. He has no wheezes. He has no rales.    CBC    Component Value Date/Time   WBC 13.1* 05/03/2014 1411   RBC 4.28 05/03/2014 1411   HGB 12.8* 05/03/2014 1411   HCT 37.7* 05/03/2014 1411   PLT 257 05/03/2014 1411   MCV 88.1 05/03/2014 1411   LYMPHSABS 1.8 05/03/2014 1411   MONOABS 0.8 05/03/2014 1411   EOSABS 0.1 05/03/2014 1411   BASOSABS 0.0 05/03/2014 1411    CMP     Component Value Date/Time   NA 139 04/29/2014 1958   K 4.2 04/29/2014 1958   CL 104 04/29/2014 1958   CO2 20 04/29/2014 1958   GLUCOSE 99 04/29/2014 1958   BUN 12 04/29/2014 1958    CREATININE 0.88 04/29/2014 1958   CREATININE 0.96 08/10/2013 1212   CALCIUM 9.1 04/29/2014 1958   PROT 7.8 09/16/2013 1625   ALBUMIN 4.0 09/16/2013 1625   AST 23 09/16/2013 1625   ALT 20 09/16/2013 1625   ALKPHOS 106 09/16/2013 1625   BILITOT 0.4 09/16/2013 1625   GFRNONAA >90 04/29/2014 1958   GFRNONAA >89 08/10/2013 1212   GFRAA >90 04/29/2014 1958   GFRAA >89 08/10/2013 1212    Lab Results  Component Value Date/Time   CHOL 180 08/10/2013 12:12 PM    No components found for: HGA1C  Lab Results  Component Value Date/Time   AST 23 09/16/2013 04:25 PM    Assessment and Plan  Protruded lumbar disc/ Chronic low back pain - Plan: traMADol (ULTRAM) 50 MG tablet, patient will schedule followup appointment with sports medicine and once he gets insurance he'll see the neurosurgeon.  Smoking Advised patient to quit smoking.  Need for prophylactic vaccination against Streptococcus pneumoniae (pneumococcus)  Needs flu shot Flu shot given today.  Essential hypertension - Plan: Advised patient for DASH diet, resume back on amLODipine (NORVASC) 5 MG tablet   Health Maintenance Flu shot and pneumovax today   Return in about 3 months (around 09/04/2014) for hypertension.  Lorayne Marek, MD

## 2014-06-04 NOTE — Progress Notes (Signed)
Patient here for follow up on his back pain  States his pain is getting worse Stated had and MRI last month and shows more damage to his back

## 2014-06-04 NOTE — Patient Instructions (Signed)
Smoking Cessation Quitting smoking is important to your health and has many advantages. However, it is not always easy to quit since nicotine is a very addictive drug. Oftentimes, people try 3 times or more before being able to quit. This document explains the best ways for you to prepare to quit smoking. Quitting takes hard work and a lot of effort, but you can do it. ADVANTAGES OF QUITTING SMOKING  You will live longer, feel better, and live better.  Your body will feel the impact of quitting smoking almost immediately.  Within 20 minutes, blood pressure decreases. Your pulse returns to its normal level.  After 8 hours, carbon monoxide levels in the blood return to normal. Your oxygen level increases.  After 24 hours, the chance of having a heart attack starts to decrease. Your breath, hair, and body stop smelling like smoke.  After 48 hours, damaged nerve endings begin to recover. Your sense of taste and smell improve.  After 72 hours, the body is virtually free of nicotine. Your bronchial tubes relax and breathing becomes easier.  After 2 to 12 weeks, lungs can hold more air. Exercise becomes easier and circulation improves.  The risk of having a heart attack, stroke, cancer, or lung disease is greatly reduced.  After 1 year, the risk of coronary heart disease is cut in half.  After 5 years, the risk of stroke falls to the same as a nonsmoker.  After 10 years, the risk of lung cancer is cut in half and the risk of other cancers decreases significantly.  After 15 years, the risk of coronary heart disease drops, usually to the level of a nonsmoker.  If you are pregnant, quitting smoking will improve your chances of having a healthy baby.  The people you live with, especially any children, will be healthier.  You will have extra money to spend on things other than cigarettes. QUESTIONS TO THINK ABOUT BEFORE ATTEMPTING TO QUIT You may want to talk about your answers with your  health care provider.  Why do you want to quit?  If you tried to quit in the past, what helped and what did not?  What will be the most difficult situations for you after you quit? How will you plan to handle them?  Who can help you through the tough times? Your family? Friends? A health care provider?  What pleasures do you get from smoking? What ways can you still get pleasure if you quit? Here are some questions to ask your health care provider:  How can you help me to be successful at quitting?  What medicine do you think would be best for me and how should I take it?  What should I do if I need more help?  What is smoking withdrawal like? How can I get information on withdrawal? GET READY  Set a quit date.  Change your environment by getting rid of all cigarettes, ashtrays, matches, and lighters in your home, car, or work. Do not let people smoke in your home.  Review your past attempts to quit. Think about what worked and what did not. GET SUPPORT AND ENCOURAGEMENT You have a better chance of being successful if you have help. You can get support in many ways.  Tell your family, friends, and coworkers that you are going to quit and need their support. Ask them not to smoke around you.  Get individual, group, or telephone counseling and support. Programs are available at local hospitals and health centers. Call   your local health department for information about programs in your area.  Spiritual beliefs and practices may help some smokers quit.  Download a "quit meter" on your computer to keep track of quit statistics, such as how long you have gone without smoking, cigarettes not smoked, and money saved.  Get a self-help book about quitting smoking and staying off tobacco. LEARN NEW SKILLS AND BEHAVIORS  Distract yourself from urges to smoke. Talk to someone, go for a walk, or occupy your time with a task.  Change your normal routine. Take a different route to work.  Drink tea instead of coffee. Eat breakfast in a different place.  Reduce your stress. Take a hot bath, exercise, or read a book.  Plan something enjoyable to do every day. Reward yourself for not smoking.  Explore interactive web-based programs that specialize in helping you quit. GET MEDICINE AND USE IT CORRECTLY Medicines can help you stop smoking and decrease the urge to smoke. Combining medicine with the above behavioral methods and support can greatly increase your chances of successfully quitting smoking.  Nicotine replacement therapy helps deliver nicotine to your body without the negative effects and risks of smoking. Nicotine replacement therapy includes nicotine gum, lozenges, inhalers, nasal sprays, and skin patches. Some may be available over-the-counter and others require a prescription.  Antidepressant medicine helps people abstain from smoking, but how this works is unknown. This medicine is available by prescription.  Nicotinic receptor partial agonist medicine simulates the effect of nicotine in your brain. This medicine is available by prescription. Ask your health care provider for advice about which medicines to use and how to use them based on your health history. Your health care provider will tell you what side effects to look out for if you choose to be on a medicine or therapy. Carefully read the information on the package. Do not use any other product containing nicotine while using a nicotine replacement product.  RELAPSE OR DIFFICULT SITUATIONS Most relapses occur within the first 3 months after quitting. Do not be discouraged if you start smoking again. Remember, most people try several times before finally quitting. You may have symptoms of withdrawal because your body is used to nicotine. You may crave cigarettes, be irritable, feel very hungry, cough often, get headaches, or have difficulty concentrating. The withdrawal symptoms are only temporary. They are strongest  when you first quit, but they will go away within 10-14 days. To reduce the chances of relapse, try to:  Avoid drinking alcohol. Drinking lowers your chances of successfully quitting.  Reduce the amount of caffeine you consume. Once you quit smoking, the amount of caffeine in your body increases and can give you symptoms, such as a rapid heartbeat, sweating, and anxiety.  Avoid smokers because they can make you want to smoke.  Do not let weight gain distract you. Many smokers will gain weight when they quit, usually less than 10 pounds. Eat a healthy diet and stay active. You can always lose the weight gained after you quit.  Find ways to improve your mood other than smoking. FOR MORE INFORMATION  www.smokefree.gov  Document Released: 07/06/2001 Document Revised: 11/26/2013 Document Reviewed: 10/21/2011 ExitCare Patient Information 2015 [employer]. This information is not intended to replace advice given to you by your health care provider. Make sure you discuss any questions you have with your health care provider. DASH Eating Plan DASH stands for "Dietary Approaches to Stop Hypertension." The DASH eating plan is a healthy eating plan that has   been shown to reduce high blood pressure (hypertension). Additional health benefits may include reducing the risk of type 2 diabetes mellitus, heart disease, and stroke. The DASH eating plan may also help with weight loss. WHAT DO I NEED TO KNOW ABOUT THE DASH EATING PLAN? For the DASH eating plan, you will follow these general guidelines:  Choose foods with a percent daily value for sodium of less than 5% (as listed on the food label).  Use salt-free seasonings or herbs instead of table salt or sea salt.  Check with your health care provider or pharmacist before using salt substitutes.  Eat lower-sodium products, often labeled as "lower sodium" or "no salt added."  Eat fresh foods.  Eat more vegetables, fruits, and low-fat dairy  products.  Choose whole grains. Look for the word "whole" as the first word in the ingredient list.  Choose fish and skinless chicken or turkey more often than red meat. Limit fish, poultry, and meat to 6 oz (170 g) each day.  Limit sweets, desserts, sugars, and sugary drinks.  Choose heart-healthy fats.  Limit cheese to 1 oz (28 g) per day.  Eat more home-cooked food and less restaurant, buffet, and fast food.  Limit fried foods.  Cook foods using methods other than frying.  Limit canned vegetables. If you do use them, rinse them well to decrease the sodium.  When eating at a restaurant, ask that your food be prepared with less salt, or no salt if possible. WHAT FOODS CAN I EAT? Seek help from a dietitian for individual calorie needs. Grains Whole grain or whole wheat bread. Brown rice. Whole grain or whole wheat pasta. Quinoa, bulgur, and whole grain cereals. Low-sodium cereals. Corn or whole wheat flour tortillas. Whole grain cornbread. Whole grain crackers. Low-sodium crackers. Vegetables Fresh or frozen vegetables (raw, steamed, roasted, or grilled). Low-sodium or reduced-sodium tomato and vegetable juices. Low-sodium or reduced-sodium tomato sauce and paste. Low-sodium or reduced-sodium canned vegetables.  Fruits All fresh, canned (in natural juice), or frozen fruits. Meat and Other Protein Products Ground beef (85% or leaner), grass-fed beef, or beef trimmed of fat. Skinless chicken or turkey. Ground chicken or turkey. Pork trimmed of fat. All fish and seafood. Eggs. Dried beans, peas, or lentils. Unsalted nuts and seeds. Unsalted canned beans. Dairy Low-fat dairy products, such as skim or 1% milk, 2% or reduced-fat cheeses, low-fat ricotta or cottage cheese, or plain low-fat yogurt. Low-sodium or reduced-sodium cheeses. Fats and Oils Tub margarines without trans fats. Light or reduced-fat mayonnaise and salad dressings (reduced sodium). Avocado. Safflower, olive, or canola  oils. Natural peanut or almond butter. Other Unsalted popcorn and pretzels. The items listed above may not be a complete list of recommended foods or beverages. Contact your dietitian for more options. WHAT FOODS ARE NOT RECOMMENDED? Grains White bread. White pasta. White rice. Refined cornbread. Bagels and croissants. Crackers that contain trans fat. Vegetables Creamed or fried vegetables. Vegetables in a cheese sauce. Regular canned vegetables. Regular canned tomato sauce and paste. Regular tomato and vegetable juices. Fruits Dried fruits. Canned fruit in light or heavy syrup. Fruit juice. Meat and Other Protein Products Fatty cuts of meat. Ribs, chicken wings, bacon, sausage, bologna, salami, chitterlings, fatback, hot dogs, bratwurst, and packaged luncheon meats. Salted nuts and seeds. Canned beans with salt. Dairy Whole or 2% milk, cream, half-and-half, and cream cheese. Whole-fat or sweetened yogurt. Full-fat cheeses or blue cheese. Nondairy creamers and whipped toppings. Processed cheese, cheese spreads, or cheese curds. Condiments Onion and garlic salt,   seasoned salt, table salt, and sea salt. Canned and packaged gravies. Worcestershire sauce. Tartar sauce. Barbecue sauce. Teriyaki sauce. Soy sauce, including reduced sodium. Steak sauce. Fish sauce. Oyster sauce. Cocktail sauce. Horseradish. Ketchup and mustard. Meat flavorings and tenderizers. Bouillon cubes. Hot sauce. Tabasco sauce. Marinades. Taco seasonings. Relishes. Fats and Oils Butter, stick margarine, lard, shortening, ghee, and bacon fat. Coconut, palm kernel, or palm oils. Regular salad dressings. Other Pickles and olives. Salted popcorn and pretzels. The items listed above may not be a complete list of foods and beverages to avoid. Contact your dietitian for more information. WHERE CAN I FIND MORE INFORMATION? National Heart, [employer], and [employer] Institute: www.nhlbi.nih.gov/health/health-topics/topics/dash/ Document Released:  07/01/2011 Document Revised: 11/26/2013 Document Reviewed: 05/16/2013 ExitCare Patient Information 2015 [employer]. This information is not intended to replace advice given to you by your health care provider. Make sure you discuss any questions you have with your health care provider.  

## 2014-06-12 ENCOUNTER — Other Ambulatory Visit: Payer: Self-pay

## 2014-06-12 DIAGNOSIS — N529 Male erectile dysfunction, unspecified: Secondary | ICD-10-CM

## 2014-06-12 MED ORDER — SILDENAFIL CITRATE 100 MG PO TABS: 50.0000 mg | ORAL_TABLET | Freq: Every day | ORAL | Status: DC | PRN

## 2014-06-27 ENCOUNTER — Encounter (HOSPITAL_COMMUNITY): Payer: Self-pay | Admitting: Emergency Medicine

## 2014-06-27 ENCOUNTER — Emergency Department (HOSPITAL_COMMUNITY)
Admission: EM | Admit: 2014-06-27 | Discharge: 2014-06-27 | Disposition: A | Discharge status: Home / Self Care | Payer: Self-pay | Attending: Emergency Medicine | Admitting: Emergency Medicine

## 2014-06-27 DIAGNOSIS — Z720 Tobacco use: Secondary | ICD-10-CM | POA: Insufficient documentation

## 2014-06-27 DIAGNOSIS — Z792 Long term (current) use of antibiotics: Secondary | ICD-10-CM | POA: Insufficient documentation

## 2014-06-27 DIAGNOSIS — Z79899 Other long term (current) drug therapy: Secondary | ICD-10-CM | POA: Insufficient documentation

## 2014-06-27 DIAGNOSIS — I10 Essential (primary) hypertension: Secondary | ICD-10-CM | POA: Insufficient documentation

## 2014-06-27 DIAGNOSIS — M5442 Lumbago with sciatica, left side: Secondary | ICD-10-CM

## 2014-06-27 DIAGNOSIS — Z791 Long term (current) use of non-steroidal anti-inflammatories (NSAID): Secondary | ICD-10-CM | POA: Insufficient documentation

## 2014-06-27 DIAGNOSIS — M5441 Lumbago with sciatica, right side: Secondary | ICD-10-CM

## 2014-06-27 DIAGNOSIS — M5440 Lumbago with sciatica, unspecified side: Secondary | ICD-10-CM | POA: Insufficient documentation

## 2014-06-27 DIAGNOSIS — Z79891 Long term (current) use of opiate analgesic: Secondary | ICD-10-CM | POA: Insufficient documentation

## 2014-06-27 MED ORDER — PREDNISONE 20 MG PO TABS: ORAL_TABLET | ORAL | Status: DC

## 2014-06-27 MED ORDER — NAPROXEN 500 MG PO TABS: 500.0000 mg | ORAL_TABLET | Freq: Two times a day (BID) | ORAL | Status: DC

## 2014-06-27 MED ORDER — HYDROCODONE-ACETAMINOPHEN 5-325 MG PO TABS: ORAL_TABLET | ORAL | Status: DC

## 2014-06-27 MED ORDER — CYCLOBENZAPRINE HCL 10 MG PO TABS: 10.0000 mg | ORAL_TABLET | Freq: Two times a day (BID) | ORAL | Status: DC | PRN

## 2014-06-27 NOTE — ED Notes (Signed)
Pt c/o lower back pain that is chronic in nature with radiation down both legs x several weeks worse over last couple of days

## 2014-06-27 NOTE — ED Provider Notes (Signed)
CSN: [id number]     Arrival date & time 06/27/14  1302 History  This chart was scribed for non-physician practitioner, Alecia Lemming, PA-C working with Francine Graven, DO by Judithann Sauger, ED Scribe. The patient was seen in room TR09C/TR09C and the patient's care was started at 1:26 PM.   Chief Complaint  Patient presents with  . Back Pain   The history is provided by the patient. No language interpreter was used.   HPI Comments: Calvin [name] is a 53 y.o. male with a hx of Lumbar herniated disc who presents to the Emergency Department complaining of intermittent chronic back pain that radiates to both legs and all the way down to the left toe. He explains that the pain is worse in his left leg than right leg. He reports that he has 3 herniated discs. He reports associated numbness. He denies any fever, and N/V. He denies any new injuries or falls. He reports taking OTC medication such as BC, Ibuprofen, and acetaminophen with slight relief. He states that he has had 2 shots of steroids in back with the last one being this summer. He reports that his doctor told him that surgery was his last option.   Past Medical History  Diagnosis Date  . Hypertension   . Lumbar herniated disc    Past Surgical History  Procedure Laterality Date  . Foot surgery      right, ~ 2002   Family History  Problem Relation Age of Onset  . Diabetes Mother   . Hypertension Mother   . Heart disease Mother   . Hyperlipidemia Mother   . Diabetes Brother   . Diabetes Daughter   . Hypertension Sister   . Diabetes Sister    History  Substance Use Topics  . Smoking status: Current Every Day Smoker -- 0.50 packs/day for 20 years    Types: Cigarettes  . Smokeless tobacco: Not on file  . Alcohol Use: Yes     Comment: occasional    Review of Systems  Constitutional: Negative for fever and unexpected weight change.  Gastrointestinal: Negative for nausea, vomiting and constipation.       Neg for fecal  incontinence  Genitourinary: Negative for hematuria, flank pain and difficulty urinating.       Negative for urinary incontinence or retention  Musculoskeletal: Positive for back pain.  Neurological: Positive for numbness (in ankles, R > L ). Negative for weakness.       Negative for saddle paresthesias     Allergies  Review of patient's allergies indicates no known allergies.  Home Medications   Prior to Admission medications   Medication Sig Start Date End Date Taking? Authorizing Provider  amLODipine (NORVASC) 5 MG tablet Take 1 tablet (5 mg total) by mouth daily. 06/04/14   Lorayne Marek, MD  clindamycin (CLEOCIN) 150 MG capsule Take 1 capsule (150 mg total) by mouth every 6 (six) hours. 05/05/14   Tiffany Marilu Favre, PA-C  HYDROcodone-acetaminophen (NORCO/VICODIN) 5-325 MG per tablet Take 2 tablets by mouth every 4 (four) hours as needed for moderate pain. 10/22/13   Fransico Meadow, PA-C  ibuprofen (ADVIL,MOTRIN) 800 MG tablet Take 1 tablet (800 mg total) by mouth every 8 (eight) hours as needed for mild pain. 04/30/14   Kristen N Ward, DO  ondansetron (ZOFRAN) 4 MG tablet Take 1 tablet (4 mg total) by mouth every 6 (six) hours. 05/05/14   Tiffany Marilu Favre, PA-C  oxyCODONE-acetaminophen (PERCOCET/ROXICET) 5-325 MG per tablet Take 1 tablet by  mouth every 4 (four) hours as needed for severe pain.    Historical Provider, MD  sildenafil (VIAGRA) 100 MG tablet Take 0.5-1 tablets (50-100 mg total) by mouth daily as needed for erectile dysfunction. 06/12/14   Lorayne Marek, MD  traMADol (ULTRAM) 50 MG tablet Take 1 tablet (50 mg total) by mouth every 8 (eight) hours as needed for moderate pain. 06/04/14   Lorayne Marek, MD   BP 144/93 mmHg  Pulse 95  Temp(Src) 97.6 F (36.4 C) (Oral)  Resp 18  Ht 6\' 1"  (1.854 m)  Wt 170 lb (77.111 kg)  BMI 22.43 kg/m2  SpO2 99%   Physical Exam  Constitutional: He is oriented to person, place, and time. He appears well-developed and well-nourished. No  distress.  HENT:  Head: Normocephalic and atraumatic.  Eyes: Conjunctivae and EOM are normal.  Neck: Normal range of motion. Neck supple. No tracheal deviation present.  Cardiovascular: Normal rate.   Pulmonary/Chest: Effort normal. No respiratory distress.  Abdominal: Soft. There is no tenderness. There is no CVA tenderness.  Musculoskeletal: Normal range of motion.       Cervical back: He exhibits normal range of motion, no tenderness and no bony tenderness.       Thoracic back: He exhibits normal range of motion, no tenderness and no bony tenderness.       Lumbar back: He exhibits tenderness. He exhibits normal range of motion and no bony tenderness.  No step-off noted with palpation of spine.   Neurological: He is alert and oriented to person, place, and time. He has normal reflexes. No sensory deficit. He exhibits normal muscle tone.  5/5 strength in entire lower extremities bilaterally. He reports numbness in right ankle. This is chronic. Patient walks well with cane, no foot drop.  Skin: Skin is warm and dry.  Psychiatric: He has a normal mood and affect. His behavior is normal.  Nursing note and vitals reviewed.   ED Course  Procedures (including critical care time) DIAGNOSTIC STUDIES: Oxygen Saturation is 99% on RA, normal by my interpretation.    COORDINATION OF CARE: 1:26 PM- Pt advised of plan for treatment and pt agrees.    Labs Review Labs Reviewed - No data to display  Imaging Review No results found.   EKG Interpretation None       Patient seen and examined.   Vital signs reviewed and are as follows: Filed Vitals:   06/27/14 1315  BP: 144/93  Pulse: 95  Temp: 97.6 F (36.4 C)  Resp: 18   No red flag s/s of low back pain. Patient was counseled on back pain precautions and told to do activity as tolerated but do not lift, push, or pull heavy objects more than 10 pounds for the next week.  Patient counseled to use ice or heat on back for no longer than  15 minutes every hour.   Patient prescribed muscle relaxer and counseled on proper use of muscle relaxant medication.    Patient prescribed narcotic pain medicine and counseled on proper use of narcotic pain medications. Counseled not to combine this medication with others containing tylenol.   Urged patient not to drink alcohol, drive, or perform any other activities that requires focus while taking either of these medications.  Patient urged to follow-up with PCP if pain does not improve with treatment and rest or if pain becomes recurrent. Urged to return with worsening severe pain, loss of bowel or bladder control, trouble walking.   The patient verbalizes understanding  and agrees with the plan.   MDM   Final diagnoses:  Bilateral low back pain with sciatica, sciatica laterality unspecified   Patient with back pain with radicular features. Previous MRI, several months ago, reportedly with worsening disc disease without other complications. No severe neurological deficits he does have some sensation deficits in his lower extremity but no motor deficit. Patient is ambulatory. No warning symptoms of back pain including: fecal incontinence, urinary retention or overflow incontinence, night sweats, waking from sleep with back pain, unexplained fevers or weight loss, h/o cancer, IVDU, recent trauma. No concern for cauda equina, epidural abscess, or other serious cause of back pain. Conservative measures such as rest, ice/heat and pain medicine indicated with PCP follow-up if no improvement with conservative management.    I personally performed the services described in this documentation, which was scribed in my presence. The recorded information has been reviewed and is accurate.    Carlisle Cater, PA-C 06/27/14 [place], DO 06/28/14 [phone]

## 2014-06-27 NOTE — Discharge Instructions (Signed)
Please read and follow all provided instructions.  Your diagnoses today include:  1. Bilateral low back pain with sciatica, sciatica laterality unspecified     Tests performed today include:  Vital signs - see below for your results today  Medications prescribed:   Vicodin (hydrocodone/acetaminophen) - narcotic pain medication  DO NOT drive or perform any activities that require you to be awake and alert because this medicine can make you drowsy. BE VERY CAREFUL not to take multiple medicines containing Tylenol (also called acetaminophen). Doing so can lead to an overdose which can damage your liver and cause liver failure and possibly death.   Flexeril (cyclobenzaprine) - muscle relaxer medication  DO NOT drive or perform any activities that require you to be awake and alert because this medicine can make you drowsy.    Naproxen - anti-inflammatory pain medication  Do not exceed 500mg  naproxen every 12 hours, take with food  You have been prescribed an anti-inflammatory medication or NSAID. Take with food. Take smallest effective dose for the shortest duration needed for your pain. Stop taking if you experience stomach pain or vomiting.   Take any prescribed medications only as directed.  Home care instructions:   Follow any educational materials contained in this packet  Please rest, use ice or heat on your back for the next several days  Do not lift, push, pull anything more than 10 pounds for the next week  Follow-up instructions: Please follow-up with your primary care provider in the next 1 week for further evaluation of your symptoms.   Return instructions:  SEEK IMMEDIATE MEDICAL ATTENTION IF YOU HAVE:  New numbness, tingling, weakness, or problem with the use of your arms or legs  Severe back pain not relieved with medications  Loss control of your bowels or bladder  Increasing pain in any areas of the body (such as chest or abdominal pain)  Shortness of  breath, dizziness, or fainting.   Worsening nausea (feeling sick to your stomach), vomiting, fever, or sweats  Any other emergent concerns regarding your health   Additional Information:  Your vital signs today were: BP 144/93 mmHg   Pulse 95   Temp(Src) 97.6 F (36.4 C) (Oral)   Resp 18   Ht 6\' 1"  (1.854 m)   Wt 170 lb (77.111 kg)   BMI 22.43 kg/m2   SpO2 99% If your blood pressure (BP) was elevated above 135/85 this visit, please have this repeated by your doctor within one month. --------------
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
## 2014-07-05 ENCOUNTER — Encounter (HOSPITAL_COMMUNITY): Payer: Self-pay | Admitting: Emergency Medicine

## 2014-07-05 ENCOUNTER — Emergency Department (HOSPITAL_COMMUNITY)
Admission: EM | Admit: 2014-07-05 | Discharge: 2014-07-05 | Disposition: A | Discharge status: Home / Self Care | Payer: Self-pay | Attending: Emergency Medicine | Admitting: Emergency Medicine

## 2014-07-05 DIAGNOSIS — I10 Essential (primary) hypertension: Secondary | ICD-10-CM | POA: Insufficient documentation

## 2014-07-05 DIAGNOSIS — M545 Low back pain, unspecified: Secondary | ICD-10-CM

## 2014-07-05 DIAGNOSIS — Z720 Tobacco use: Secondary | ICD-10-CM | POA: Insufficient documentation

## 2014-07-05 DIAGNOSIS — M5136 Other intervertebral disc degeneration, lumbar region: Secondary | ICD-10-CM | POA: Insufficient documentation

## 2014-07-05 DIAGNOSIS — Z791 Long term (current) use of non-steroidal anti-inflammatories (NSAID): Secondary | ICD-10-CM | POA: Insufficient documentation

## 2014-07-05 DIAGNOSIS — Z79899 Other long term (current) drug therapy: Secondary | ICD-10-CM | POA: Insufficient documentation

## 2014-07-05 MED ORDER — HYDROMORPHONE HCL 1 MG/ML IJ SOLN
1.0000 mg | Freq: Once | INTRAMUSCULAR | Status: DC
  Filled 2014-07-05: qty 1

## 2014-07-05 MED ORDER — METHOCARBAMOL 500 MG PO TABS
500.0000 mg | ORAL_TABLET | Freq: Once | ORAL | Status: AC
  Administered 2014-07-05: 500 mg via ORAL
  Filled 2014-07-05: qty 1

## 2014-07-05 MED ORDER — HYDROCODONE-ACETAMINOPHEN 5-325 MG PO TABS
1.0000 | ORAL_TABLET | Freq: Once | ORAL | Status: AC
  Administered 2014-07-05: 1 via ORAL
  Filled 2014-07-05: qty 1

## 2014-07-05 MED ORDER — HYDROCODONE-ACETAMINOPHEN 5-325 MG PO TABS: 1.0000 | ORAL_TABLET | Freq: Four times a day (QID) | ORAL | Status: DC | PRN

## 2014-07-05 MED ORDER — METHOCARBAMOL 500 MG PO TABS: 500.0000 mg | ORAL_TABLET | Freq: Two times a day (BID) | ORAL | Status: DC

## 2014-07-05 MED ORDER — IBUPROFEN 600 MG PO TABS: 600.0000 mg | ORAL_TABLET | Freq: Four times a day (QID) | ORAL | Status: DC | PRN

## 2014-07-05 MED ORDER — IBUPROFEN 200 MG PO TABS
600.0000 mg | ORAL_TABLET | Freq: Once | ORAL | Status: AC
  Administered 2014-07-05: 600 mg via ORAL
  Filled 2014-07-05: qty 3

## 2014-07-05 NOTE — ED Provider Notes (Signed)
CSN: [id number]     Arrival date & time 07/05/14  1049 History   First MD Initiated Contact with Patient 07/05/14 1120     Chief Complaint  Patient presents with  . Back Pain     (Consider location/radiation/quality/duration/timing/severity/associated sxs/prior Treatment) HPI Comments: SUBJECTIVE:  Calvin [name] is a 53 y.o. male who complains of an injury causing low back pain starting few days ago. The pain is positional with bending or lifting, without radiation down the legs. Pt has had similar sx in the past, and has had MRI spine, that was neg for cord compression. Pt's pain if off and on for a while now, but is worse more recently. Currently taking OTC meds only. Prior history of back problems: recurrent self limited episodes of low back pain in the past. There is no numbness in the legs. No associated weakness, urinary incontinence, urinary retention, bowel incontinence, weakness.   OBJECTIVE: Vital signs as noted above. Patient appears to be in mild to moderate pain, antalgic gait noted. Lumbosacral spine area reveals no local tenderness or mass. Painful and reduced LS ROM noted. Straight leg raise is negative at 15 degrees on bilateral. DTR's, motor strength and sensation normal, including heel and toe gait.  Peripheral pulses are palpable. Lumbar spine X-Ray: not indicated.   ASSESSMENT:  Degenerative disk disease  PLAN: For acute pain, rest, intermittent application of cold packs (later, may switch to heat, but do not sleep on heating pad), analgesics and muscle relaxants are recommended. Discussed longer term treatment plan of prn NSAID's and discussed a home back care exercise program with flexion exercise routine. Proper lifting with avoidance of heavy lifting discussed. Consider Physical Therapy and XRay studies if not improving. Call or return to clinic prn if these symptoms worsen or fail to improve as anticipated.   Patient is a 53 y.o. male presenting with back pain. The  history is provided by the patient.  Back Pain   Past Medical History  Diagnosis Date  . Hypertension   . Lumbar herniated disc    Past Surgical History  Procedure Laterality Date  . Foot surgery      right, ~ 2002   Family History  Problem Relation Age of Onset  . Diabetes Mother   . Hypertension Mother   . Heart disease Mother   . Hyperlipidemia Mother   . Diabetes Brother   . Diabetes Daughter   . Hypertension Sister   . Diabetes Sister    History  Substance Use Topics  . Smoking status: Current Every Day Smoker -- 0.50 packs/day for 20 years    Types: Cigarettes  . Smokeless tobacco: Not on file  . Alcohol Use: Yes     Comment: occasional    Review of Systems  Musculoskeletal: Positive for back pain.      Allergies  Review of patient's allergies indicates no known allergies.  Home Medications   Prior to Admission medications   Medication Sig Start Date End Date Taking? Authorizing Provider  amLODipine (NORVASC) 5 MG tablet Take 1 tablet (5 mg total) by mouth daily. 06/04/14  Yes Lorayne Marek, MD  cyclobenzaprine (FLEXERIL) 10 MG tablet Take 1 tablet (10 mg total) by mouth 2 (two) times daily as needed for muscle spasms. 06/27/14  Yes Carlisle Cater, PA-C  naproxen (NAPROSYN) 500 MG tablet Take 1 tablet (500 mg total) by mouth 2 (two) times daily. 06/27/14  Yes Carlisle Cater, PA-C  sildenafil (VIAGRA) 100 MG tablet Take 0.5-1 tablets (50-100 mg total)  by mouth daily as needed for erectile dysfunction. 06/12/14  Yes Lorayne Marek, MD  clindamycin (CLEOCIN) 150 MG capsule Take 1 capsule (150 mg total) by mouth every 6 (six) hours. Patient not taking: Reported on 07/05/2014 05/05/14   Linus Mako, PA-C  HYDROcodone-acetaminophen (NORCO/VICODIN) 5-325 MG per tablet Take 1-2 tablets every 6 hours as needed for severe pain Patient not taking: Reported on 07/05/2014 06/27/14   Carlisle Cater, PA-C  HYDROcodone-acetaminophen (NORCO/VICODIN) 5-325 MG per tablet Take  1 tablet by mouth every 6 (six) hours as needed. 07/05/14   Varney Biles, MD  ibuprofen (ADVIL,MOTRIN) 600 MG tablet Take 1 tablet (600 mg total) by mouth every 6 (six) hours as needed. 07/05/14   Varney Biles, MD  methocarbamol (ROBAXIN) 500 MG tablet Take 1 tablet (500 mg total) by mouth 2 (two) times daily. 07/05/14   Varney Biles, MD  ondansetron (ZOFRAN) 4 MG tablet Take 1 tablet (4 mg total) by mouth every 6 (six) hours. Patient not taking: Reported on 07/05/2014 05/05/14   Linus Mako, PA-C  predniSONE (DELTASONE) 20 MG tablet 3 Tabs PO Days 1-3, then 2 tabs PO Days 4-6, then 1 tab PO Day 7-9, then Half Tab PO Day 10-12 Patient not taking: Reported on 07/05/2014 06/27/14   Carlisle Cater, PA-C   There were no vitals taken for this visit. Physical Exam  Constitutional: He appears well-developed.  HENT:  Head: Atraumatic.  Eyes: Conjunctivae are normal.  Cardiovascular: Normal rate.   Pulmonary/Chest: Effort normal.  Abdominal: Soft.  Musculoskeletal:  Pt has tenderness over the lumbar region No step offs, no erythema. Pt has 2+ patellar reflex bilaterally. Able to discriminate between sharp and dull. Able to ambulate   Nursing note and vitals reviewed.   ED Course  Procedures (including critical care time) Labs Review Labs Reviewed - No data to display  Imaging Review No results found.   EKG Interpretation None     IMPRESSION: MRI THORACIC SPINE:  1. No evidence of cord compression. 2. No epidural fluid collection or abnormal enhancement to suggest active infection. 3. Small left paracentral disc protrusion at T7-8 with secondary mild flattening of the left hemi cord. 4. Additional tiny disc protrusions at T4-5 and T5-6 without significant stenosis as above.  MRI LUMBAR SPINE:  1. Limited study due to lack of all sequences available for review. No evidence of cord compression. 2. No epidural fluid collection or abnormal enhancement to  suggest active infection. 3. Central disc protrusion with superimposed facet hypertrophy at L4-5 with resultant mild to moderate canal stenosis, not significantly changed relative to recent MRI. 4. Smaller central disc protrusion at L5-S1 without significant stenosis. Results were called by telephone at the time of interpretation on 04/30/2014 at 12:49 am to Dr. Pryor Curia , who verbally acknowledged these results.  MDM   Final diagnoses:  Back pain at L4-L5 level  Degenerative disc disease, lumbar    DDx includes: - DJD of the back - Spondylitises/ spondylosis - Sciatica - Spinal cord compression - Conus medullaris - Epidural hematoma - Epidural abscess - Lytic/pathologic fracture - Myelitis - Musculoskeletal pain  Pt comes in with cc of back pain. Pt has known DJD of the back - and the pain is described as flare up of his existing chronic pain. Pt has no NEW redflags for cord  Compression. Neuro exam is benign. Will not get any imaging. Pain control for now.  Varney Biles, MD 07/05/14 1348

## 2014-07-05 NOTE — Discharge Instructions (Signed)
We saw you in the ER for back pain. The imaging from before and the exam today don't indicate any spinal cord involvement - and thus we feel comfortable sending you home. Please take the ibuprofen every 6 hours for the next 2 days, take the muscle relaxant as needed, and see your primary care doctor for further pain control.  Please use the back exercises to strengthen the back muscles.   Back Exercises Back exercises help treat and prevent back injuries. The goal of back exercises is to increase the strength of your abdominal and back muscles and the flexibility of your back. These exercises should be started when you no longer have back pain. Back exercises include:  Pelvic Tilt. Lie on your back with your knees bent. Tilt your pelvis until the lower part of your back is against the floor. Hold this position 5 to 10 sec and repeat 5 to 10 times.  Knee to Chest. Pull first 1 knee up against your chest and hold for 20 to 30 seconds, repeat this with the other knee, and then both knees. This may be done with the other leg straight or bent, whichever feels better.  Sit-Ups or Curl-Ups. Bend your knees 90 degrees. Start with tilting your pelvis, and do a partial, slow sit-up, lifting your trunk only 30 to 45 degrees off the floor. Take at least 2 to 3 seconds for each sit-up. Do not do sit-ups with your knees out straight. If partial sit-ups are difficult, simply do the above but with only tightening your abdominal muscles and holding it as directed.  Hip-Lift. Lie on your back with your knees flexed 90 degrees. Push down with your feet and shoulders as you raise your hips a couple inches off the floor; hold for 10 seconds, repeat 5 to 10 times.  Back arches. Lie on your stomach, propping yourself up on bent elbows. Slowly press on your hands, causing an arch in your low back. Repeat 3 to 5 times. Any initial stiffness and discomfort should lessen with repetition over time.  Shoulder-Lifts. Lie face  down with arms beside your body. Keep hips and torso pressed to floor as you slowly lift your head and shoulders off the floor. Do not overdo your exercises, especially in the beginning. Exercises may cause you some mild back discomfort which lasts for a few minutes; however, if the pain is more severe, or lasts for more than 15 minutes, do not continue exercises until you see your caregiver. Improvement with exercise therapy for back problems is slow.  See your caregivers for assistance with developing a proper back exercise program. Document Released: 08/19/2004 Document Revised: 10/04/2011 Document Reviewed: 05/13/2011 [facility] Patient Information 2015 [place], [place]. This information is not intended to replace advice given to you by your health care provider. Make sure you discuss any questions you have with your health care provider.  Back Pain, Adult Low back pain is very common. About 1 in 5 people have back pain.The cause of low back pain is rarely dangerous. The pain often gets better over time.About half of people with a sudden onset of back pain feel better in just 2 weeks. About 8 in 10 people feel better by 6 weeks.  CAUSES Some common causes of back pain include:  Strain of the muscles or ligaments supporting the spine.  Wear and tear (degeneration) of the spinal discs.  Arthritis.  Direct injury to the back. DIAGNOSIS Most of the time, the direct cause of low back pain  is not known.However, back pain can be treated effectively even when the exact cause of the pain is unknown.Answering your caregiver's questions about your overall health and symptoms is one of the most accurate ways to make sure the cause of your pain is not dangerous. If your caregiver needs more information, he or she may order lab work or imaging tests (X-rays or MRIs).However, even if imaging tests show changes in your back, this usually does not require surgery. HOME CARE INSTRUCTIONS For many people,  back pain returns.Since low back pain is rarely dangerous, it is often a condition that people can learn to [facility] their own.   Remain active. It is stressful on the back to sit or stand in one place. Do not sit, drive, or stand in one place for more than 30 minutes at a time. Take short walks on level surfaces as soon as pain allows.Try to increase the length of time you walk each day.  Do not stay in bed.Resting more than 1 or 2 days can delay your recovery.  Do not avoid exercise or work.Your body is made to move.It is not dangerous to be active, even though your back may hurt.Your back will likely heal faster if you return to being active before your pain is gone.  Pay attention to your body when you bend and lift. Many people have less discomfortwhen lifting if they bend their knees, keep the load close to their bodies,and avoid twisting. Often, the most comfortable positions are those that put less stress on your recovering back.  Find a comfortable position to sleep. Use a firm mattress and lie on your side with your knees slightly bent. If you lie on your back, put a pillow under your knees.  Only take over-the-counter or prescription medicines as directed by your caregiver. Over-the-counter medicines to reduce pain and inflammation are often the most helpful.Your caregiver may prescribe muscle relaxant drugs.These medicines help dull your pain so you can more quickly return to your normal activities and healthy exercise.  Put ice on the injured area.  Put ice in a plastic bag.  Place a towel between your skin and the bag.  Leave the ice on for 15-20 minutes, 03-04 times a day for the first 2 to 3 days. After that, ice and heat may be alternated to reduce pain and spasms.  Ask your caregiver about trying back exercises and gentle massage. This may be of some benefit.  Avoid feeling anxious or stressed.Stress increases muscle tension and can worsen back pain.It is  important to recognize when you are anxious or stressed and learn ways to manage it.Exercise is a great option. SEEK MEDICAL CARE IF:  You have pain that is not relieved with rest or medicine.  You have pain that does not improve in 1 week.  You have new symptoms.  You are generally not feeling well. SEEK IMMEDIATE MEDICAL CARE IF:   You have pain that radiates from your back into your legs.  You develop new bowel or bladder control problems.  You have unusual weakness or numbness in your arms or legs.  You develop nausea or vomiting.  You develop abdominal pain.  You feel faint. Document Released: 07/12/2005 Document Revised: 01/11/2012 Document Reviewed: 11/13/2013 [facility] Patient Information 2015 [place], [place]. This information is not intended to replace advice given to you by your health care provider. Make sure you discuss any questions you have with your health care provider.

## 2014-07-05 NOTE — ED Notes (Signed)
Pt states that he has some herniated discs in his back and having pain for past couple of days.

## 2014-07-25 ENCOUNTER — Ambulatory Visit: Payer: Self-pay | Admitting: Sports Medicine

## 2014-08-06 ENCOUNTER — Ambulatory Visit (INDEPENDENT_AMBULATORY_CARE_PROVIDER_SITE_OTHER): Payer: Self-pay | Admitting: Sports Medicine

## 2014-08-06 ENCOUNTER — Encounter: Payer: Self-pay | Admitting: Sports Medicine

## 2014-08-06 VITALS — BP 140/98 | Ht 73.0 in | Wt 170.0 lb

## 2014-08-06 DIAGNOSIS — M5416 Radiculopathy, lumbar region: Secondary | ICD-10-CM | POA: Insufficient documentation

## 2014-08-06 DIAGNOSIS — M47816 Spondylosis without myelopathy or radiculopathy, lumbar region: Secondary | ICD-10-CM

## 2014-08-06 MED ORDER — GABAPENTIN 300 MG PO CAPS: 300.0000 mg | ORAL_CAPSULE | Freq: Every day | ORAL | Status: DC

## 2014-08-06 NOTE — Assessment & Plan Note (Signed)
For Radicular Symptoms: -Start gabapentin 1 tablet once nightly. It can make you drowsy, so use caution. You can increase to twice daily after 1 week if you find benefit with few side effects, but use caution if driving. -We would recommend you follow-up with Spine Ortho or Neurosurgeon.  Follow-up in 4-6 weeks if needed

## 2014-08-06 NOTE — Patient Instructions (Signed)
For Low Back Stiffness: -We will arrange for you to get lumbar facet injections, likely at L4-5. -Continue flexeril, tramadol, and anti-inflammatory medications  For Radicular Symptoms: -Start gabapentin 1 tablet once nightly. It can make you drowsy, so use caution. You can increase to twice daily after 1 week if you find benefit with few side effects, but use caution if driving. -We would recommend you follow-up with Spine Ortho or Neurosurgeon.  Follow-up in 4-6 weeks if needed

## 2014-08-06 NOTE — Progress Notes (Signed)
   Subjective:    Patient ID: Calvin [name], male    DOB: 1961/04/11, 54 y.o.   MRN: [id number]  HPI  Calvin [name] is a 54 year old male with a history of chronic low back pain who presents with persistent low back pain and radicular symptoms. Onset of symptoms was proximally 1 year ago without any known acute injury. He describes pain located in a bandlike fashion across the low back. He describes radiation of his low back pain down the right greater than left lower extremity. Character is a sharp burning pain with associated numbness. He denies any loss of bladder or bowel, saddle anesthesia, fevers, or chills. The low back is aggravated with standing up perfectly straight and first thing in the morning. He describes a stiff feeling. As far as the lower extremity pain and numbness, he notes associated weakness that is aggravated after walking 10-15 yards.  In April and then again in June 2015 he received lumbar epidural steroid injections, which she said only provided very short-term temporary relief. Last spring he also attended 5 weeks of physical therapy, which only helps slightly. He has also tried taking anti-inflammatories, tramadol, a six-day Medrol Dosepak, and believes that he used to be on Neurontin.  In October 2015 he underwent an MRI of the lumbar spine which did not show cord compression, mild central disc pursue trusion, facet hypertrophy at L4-5 with mild to moderate canal stenosis, tiny left and right foraminal protrusions without stenosis.  He has been to the emergency room a few times due to his back pain.  He is working on Public affairs consultant, with plans to see a neurosurgeon for his lower extremity weakness that was recommended by his primary care physician.  Please see problem based assessment and plan in the problem list.  Review of Systems 11 point review of systems was performed and was otherwise negative unless noted in the history of present illness.     Objective:   Physical  Exam BP 140/98 mmHg  Ht 6\' 1"  (1.854 m)  Wt 170 lb (77.111 kg)  BMI 22.43 kg/m2 GEN: The patient is well-developed well-nourished male and in no acute distress.  He is awake alert and oriented x3. SKIN: warm and well-perfused, no rash  EXTR: No lower extremity edema or calf tenderness Neuro: Strength 5/5 globally, bilateral lower extremities. Sensation grossly intact throughout. Deep tendon reflexes +3 brisk in bilateral lower extremities at patellar and achilles. No clonus. Vasc: +2 bilateral distal pulses. No edema.  MSK: Examination of the lumbar spine reveals grossly full range of motion, though limited somewhat due to pain. He leans forward with ambulation. +TTP at bilateral paraspinal musculature, minimal bony tenderness. Tender at right SI joint as well. +Backward bend test. Full hip ROM without pain. Good muscle tone bilateral lower extremities. No fasciculations. No foot drop.   04/2014 MRI LUMBAR SPINE: 1. Limited study due to lack of all sequences available for review. No evidence of cord compression. 2. No epidural fluid collection or abnormal enhancement to suggest active infection. 3. Central disc protrusion with superimposed facet hypertrophy at L4-5 with resultant mild to moderate canal stenosis, not significantly changed relative to recent MRI. 4. Smaller central disc protrusion at L5-S1 without significant stenosis. Results were called by telephone at the time of interpretation on 04/30/2014 at 12:49 am to Dr. Pryor Curia , who verbally acknowledged these results.    Assessment & Plan:  Please see problem based assessment and plan in the problem list.

## 2014-08-06 NOTE — Assessment & Plan Note (Signed)
For Low Back Stiffness: -We will arrange for you to get lumbar facet injections, likely at L4-5. Already tried lumbar ESI x2 with only mild relief. -Continue flexeril, tramadol, and anti-inflammatory medications  Follow-up in 4-6 weeks if needed

## 2014-08-08 ENCOUNTER — Other Ambulatory Visit: Payer: Self-pay | Admitting: Internal Medicine

## 2014-08-08 ENCOUNTER — Telehealth: Payer: Self-pay

## 2014-08-08 NOTE — Telephone Encounter (Signed)
If his last prescription was done more than 30 days ago then he can be given refill.

## 2014-08-08 NOTE — Telephone Encounter (Signed)
Patient calling requesting a refill on tramadol Can we refill this for the patient?

## 2014-09-25 ENCOUNTER — Other Ambulatory Visit: Payer: Self-pay | Admitting: Sports Medicine

## 2014-09-25 DIAGNOSIS — M5416 Radiculopathy, lumbar region: Secondary | ICD-10-CM

## 2014-10-07 ENCOUNTER — Ambulatory Visit: Payer: Self-pay | Admitting: Internal Medicine

## 2014-10-15 ENCOUNTER — Ambulatory Visit: Payer: Self-pay | Attending: Internal Medicine | Admitting: Internal Medicine

## 2014-10-15 ENCOUNTER — Encounter: Payer: Self-pay | Admitting: Internal Medicine

## 2014-10-15 VITALS — BP 130/80 | HR 96 | Temp 98.0°F | Resp 16 | Wt 160.0 lb

## 2014-10-15 DIAGNOSIS — M545 Low back pain: Secondary | ICD-10-CM | POA: Insufficient documentation

## 2014-10-15 DIAGNOSIS — I10 Essential (primary) hypertension: Secondary | ICD-10-CM | POA: Insufficient documentation

## 2014-10-15 DIAGNOSIS — F17200 Nicotine dependence, unspecified, uncomplicated: Secondary | ICD-10-CM

## 2014-10-15 DIAGNOSIS — Z720 Tobacco use: Secondary | ICD-10-CM | POA: Insufficient documentation

## 2014-10-15 DIAGNOSIS — G8929 Other chronic pain: Secondary | ICD-10-CM | POA: Insufficient documentation

## 2014-10-15 LAB — COMPLETE METABOLIC PANEL WITH GFR
ALT: 21 U/L (ref 0–53)
AST: 21 U/L (ref 0–37)
Albumin: 4 g/dL (ref 3.5–5.2)
Alkaline Phosphatase: 89 U/L (ref 39–117)
BUN: 10 mg/dL (ref 6–23)
CALCIUM: 9 mg/dL (ref 8.4–10.5)
CHLORIDE: 106 meq/L (ref 96–112)
CO2: 30 mEq/L (ref 19–32)
Creat: 1.03 mg/dL (ref 0.50–1.35)
GFR, Est African American: >89 mL/min
GFR, Est Non African American: 83 mL/min
Glucose, Bld: 72 mg/dL (ref 70–99)
Potassium: 5.3 mEq/L (ref 3.5–5.3)
Sodium: 138 mEq/L (ref 135–145)
Total Bilirubin: 0.4 mg/dL (ref 0.2–1.2)
Total Protein: 6.9 g/dL (ref 6.0–8.3)

## 2014-10-15 MED ORDER — CYCLOBENZAPRINE HCL 10 MG PO TABS
10.0000 mg | ORAL_TABLET | Freq: Two times a day (BID) | ORAL | Status: DC | PRN
Start: 2014-10-15 — End: 2015-01-15

## 2014-10-15 MED ORDER — TRAMADOL HCL 50 MG PO TABS: 50.0000 mg | ORAL_TABLET | Freq: Two times a day (BID) | ORAL | Status: DC | PRN

## 2014-10-15 NOTE — Progress Notes (Signed)
Patient here for follow up on his chronic back pain and medication refills

## 2014-10-15 NOTE — Progress Notes (Signed)
MRN: [id number] Name: Calvin [name]  Sex: male Age: 54 y.o. DOB: 06/14/1961  Allergies: Review of patient's allergies indicates no known allergies.  Chief Complaint  Patient presents with  . Back Pain    HPI: Patient is 54 y.o. male who has history of hypertension, chronic low back pain comes today for followup and requesting refill on pain medication, patient has already seen a sports medicine and is going to get injection done in the back he was also started on Neurontin to take at nighttime as per patient it does not help much, he takes tramadol when necessary and is using muscle relaxant, he still smokes cigarettes again counseled patient to quit smoking.  Past Medical History  Diagnosis Date  . Hypertension   . Lumbar herniated disc     Past Surgical History  Procedure Laterality Date  . Foot surgery      right, ~ 2002      Medication List       This list is accurate as of: 10/15/14  3:49 PM.  Always use your most recent med list.               amLODipine 5 MG tablet  Commonly known as:  NORVASC  Take 1 tablet (5 mg total) by mouth daily.     cephALEXin 500 MG capsule  Commonly known as:  KEFLEX     clindamycin 150 MG capsule  Commonly known as:  CLEOCIN  Take 1 capsule (150 mg total) by mouth every 6 (six) hours.     cyclobenzaprine 10 MG tablet  Commonly known as:  FLEXERIL  Take 1 tablet (10 mg total) by mouth 2 (two) times daily as needed for muscle spasms.     gabapentin 300 MG capsule  Commonly known as:  NEURONTIN  Take 1 capsule (300 mg total) by mouth at bedtime.     HYDROcodone-acetaminophen 5-325 MG per tablet  Commonly known as:  NORCO/VICODIN  Take 1-2 tablets every 6 hours as needed for severe pain     HYDROcodone-acetaminophen 5-325 MG per tablet  Commonly known as:  NORCO/VICODIN  Take 1 tablet by mouth every 6 (six) hours as needed.     ibuprofen 800 MG tablet  Commonly known as:  ADVIL,MOTRIN     ibuprofen 600 MG tablet    Commonly known as:  ADVIL,MOTRIN  Take 1 tablet (600 mg total) by mouth every 6 (six) hours as needed.     methocarbamol 500 MG tablet  Commonly known as:  ROBAXIN  Take 1 tablet (500 mg total) by mouth 2 (two) times daily.     methylPREDNIsolone 4 MG tablet  Commonly known as:  MEDROL DOSPACK     naproxen 500 MG tablet  Commonly known as:  NAPROSYN  Take 1 tablet (500 mg total) by mouth 2 (two) times daily.     ondansetron 4 MG tablet  Commonly known as:  ZOFRAN  Take 1 tablet (4 mg total) by mouth every 6 (six) hours.     oxyCODONE-acetaminophen 5-325 MG per tablet  Commonly known as:  PERCOCET/ROXICET     predniSONE 20 MG tablet  Commonly known as:  DELTASONE  3 Tabs PO Days 1-3, then 2 tabs PO Days 4-6, then 1 tab PO Day 7-9, then Half Tab PO Day 10-12     sildenafil 100 MG tablet  Commonly known as:  VIAGRA  Take 0.5-1 tablets (50-100 mg total) by mouth daily as needed for erectile dysfunction.  sulfamethoxazole-trimethoprim 800-160 MG per tablet  Commonly known as:  BACTRIM DS,SEPTRA DS     traMADol 50 MG tablet  Commonly known as:  ULTRAM  Take 1 tablet (50 mg total) by mouth every 12 (twelve) hours as needed.        Meds ordered this encounter  Medications  . cyclobenzaprine (FLEXERIL) 10 MG tablet    Sig: Take 1 tablet (10 mg total) by mouth 2 (two) times daily as needed for muscle spasms.    Dispense:  14 tablet    Refill:  0  . traMADol (ULTRAM) 50 MG tablet    Sig: Take 1 tablet (50 mg total) by mouth every 12 (twelve) hours as needed.    Dispense:  60 tablet    Refill:  0    Immunization History  Administered Date(s) Administered  . Influenza,inj,Quad PF,36+ Mos 06/04/2014  . Pneumococcal Polysaccharide-23 06/04/2014    Family History  Problem Relation Age of Onset  . Diabetes Mother   . Hypertension Mother   . Heart disease Mother   . Hyperlipidemia Mother   . Diabetes Brother   . Diabetes Daughter   . Hypertension Sister   .  Diabetes Sister     History  Substance Use Topics  . Smoking status: Current Every Day Smoker -- 0.50 packs/day for 20 years    Types: Cigarettes  . Smokeless tobacco: Not on file  . Alcohol Use: Yes     Comment: occasional    Review of Systems   As noted in HPI  Filed Vitals:   10/15/14 1456  BP: 130/80  Pulse:   Temp:   Resp:     Physical Exam  Physical Exam  HENT:  Head: Normocephalic and atraumatic.  Eyes: EOM are normal. Pupils are equal, round, and reactive to light.  Cardiovascular: Normal rate and regular rhythm.   Pulmonary/Chest: Breath sounds normal. No respiratory distress. He has no wheezes. He has no rales.  Musculoskeletal: He exhibits no edema.    CBC    Component Value Date/Time   WBC 13.1* 05/03/2014 1411   RBC 4.28 05/03/2014 1411   HGB 12.8* 05/03/2014 1411   HCT 37.7* 05/03/2014 1411   PLT 257 05/03/2014 1411   MCV 88.1 05/03/2014 1411   LYMPHSABS 1.8 05/03/2014 1411   MONOABS 0.8 05/03/2014 1411   EOSABS 0.1 05/03/2014 1411   BASOSABS 0.0 05/03/2014 1411    CMP     Component Value Date/Time   NA 139 04/29/2014 1958   K 4.2 04/29/2014 1958   CL 104 04/29/2014 1958   CO2 20 04/29/2014 1958   GLUCOSE 99 04/29/2014 1958   BUN 12 04/29/2014 1958   CREATININE 0.88 04/29/2014 1958   CREATININE 0.96 08/10/2013 1212   CALCIUM 9.1 04/29/2014 1958   PROT 7.8 09/16/2013 1625   ALBUMIN 4.0 09/16/2013 1625   AST 23 09/16/2013 1625   ALT 20 09/16/2013 1625   ALKPHOS 106 09/16/2013 1625   BILITOT 0.4 09/16/2013 1625   GFRNONAA >90 04/29/2014 1958   GFRNONAA >89 08/10/2013 1212   GFRAA >90 04/29/2014 1958   GFRAA >89 08/10/2013 1212    Lab Results  Component Value Date/Time   CHOL 180 08/10/2013 12:12 PM    No components found for: HGA1C  Lab Results  Component Value Date/Time   AST 23 09/16/2013 04:25 PM    Assessment and Plan  Chronic low back pain - Plan: patient is given refill on the medication, continue to follow with  sports  medicine cyclobenzaprine (FLEXERIL) 10 MG tablet, traMADol (ULTRAM) 50 MG tablet  Smoking Consultation to quit smoking.  Essential hypertension - Plan: His manual blood pressure is 130/80 advised patient for DASH diet, continue with current medication COMPLETE METABOLIC PANEL WITH GFR   Health Maintenance  -Vaccinations: Up-to-date with flu shot and Pneumovax  Return in about 3 months (around 01/15/2015), or if symptoms worsen or fail to improve, for hypertension.   This note has been created with Surveyor, quantity. Any transcriptional errors are unintentional.    Lorayne Marek, MD

## 2014-10-15 NOTE — Patient Instructions (Signed)
DASH Eating Plan °DASH stands for "Dietary Approaches to Stop Hypertension." The DASH eating plan is a healthy eating plan that has been shown to reduce high blood pressure (hypertension). Additional health benefits may include reducing the risk of type 2 diabetes mellitus, heart disease, and stroke. The DASH eating plan may also help with weight loss. °WHAT DO I NEED TO KNOW ABOUT THE DASH EATING PLAN? °For the DASH eating plan, you will follow these general guidelines: °· Choose foods with a percent daily value for sodium of less than 5% (as listed on the food label). °· Use salt-free seasonings or herbs instead of table salt or sea salt. °· Check with your health care provider or pharmacist before using salt substitutes. °· Eat lower-sodium products, often labeled as "lower sodium" or "no salt added." °· Eat fresh foods. °· Eat more vegetables, fruits, and low-fat dairy products. °· Choose whole grains. Look for the word "whole" as the first word in the ingredient list. °· Choose fish and skinless chicken or turkey more often than red meat. Limit fish, poultry, and meat to 6 oz (170 g) each day. °· Limit sweets, desserts, sugars, and sugary drinks. °· Choose heart-healthy fats. °· Limit cheese to 1 oz (28 g) per day. °· Eat more home-cooked food and less restaurant, buffet, and fast food. °· Limit fried foods. °· Cook foods using methods other than frying. °· Limit canned vegetables. If you do use them, rinse them well to decrease the sodium. °· When eating at a restaurant, ask that your food be prepared with less salt, or no salt if possible. °WHAT FOODS CAN I EAT? °Seek help from a dietitian for individual calorie needs. °Grains °Whole grain or whole wheat bread. Brown rice. Whole grain or whole wheat pasta. Quinoa, bulgur, and whole grain cereals. Low-sodium cereals. Corn or whole wheat flour tortillas. Whole grain cornbread. Whole grain crackers. Low-sodium crackers. °Vegetables °Fresh or frozen vegetables  (raw, steamed, roasted, or grilled). Low-sodium or reduced-sodium tomato and vegetable juices. Low-sodium or reduced-sodium tomato sauce and paste. Low-sodium or reduced-sodium canned vegetables.  °Fruits °All fresh, canned (in natural juice), or frozen fruits. °Meat and Other Protein Products °Ground beef (85% or leaner), grass-fed beef, or beef trimmed of fat. Skinless chicken or turkey. Ground chicken or turkey. Pork trimmed of fat. All fish and seafood. Eggs. Dried beans, peas, or lentils. Unsalted nuts and seeds. Unsalted canned beans. °Dairy °Low-fat dairy products, such as skim or 1% milk, 2% or reduced-fat cheeses, low-fat ricotta or cottage cheese, or plain low-fat yogurt. Low-sodium or reduced-sodium cheeses. °Fats and Oils °Tub margarines without trans fats. Light or reduced-fat mayonnaise and salad dressings (reduced sodium). Avocado. Safflower, olive, or canola oils. Natural peanut or almond butter. °Other °Unsalted popcorn and pretzels. °The items listed above may not be a complete list of recommended foods or beverages. Contact your dietitian for more options. °WHAT FOODS ARE NOT RECOMMENDED? °Grains °White bread. White pasta. White rice. Refined cornbread. Bagels and croissants. Crackers that contain trans fat. °Vegetables °Creamed or fried vegetables. Vegetables in a cheese sauce. Regular canned vegetables. Regular canned tomato sauce and paste. Regular tomato and vegetable juices. °Fruits °Dried fruits. Canned fruit in light or heavy syrup. Fruit juice. °Meat and Other Protein Products °Fatty cuts of meat. Ribs, chicken wings, bacon, sausage, bologna, salami, chitterlings, fatback, hot dogs, bratwurst, and packaged luncheon meats. Salted nuts and seeds. Canned beans with salt. °Dairy °Whole or 2% milk, cream, half-and-half, and cream cheese. Whole-fat or sweetened yogurt. Full-fat   cheeses or blue cheese. Nondairy creamers and whipped toppings. Processed cheese, cheese spreads, or cheese  curds. °Condiments °Onion and garlic salt, seasoned salt, table salt, and sea salt. Canned and packaged gravies. Worcestershire sauce. Tartar sauce. Barbecue sauce. Teriyaki sauce. Soy sauce, including reduced sodium. Steak sauce. Fish sauce. Oyster sauce. Cocktail sauce. Horseradish. Ketchup and mustard. Meat flavorings and tenderizers. Bouillon cubes. Hot sauce. Tabasco sauce. Marinades. Taco seasonings. Relishes. °Fats and Oils °Butter, stick margarine, lard, shortening, ghee, and bacon fat. Coconut, palm kernel, or palm oils. Regular salad dressings. °Other °Pickles and olives. Salted popcorn and pretzels. °The items listed above may not be a complete list of foods and beverages to avoid. Contact your dietitian for more information. °WHERE CAN I FIND MORE INFORMATION? °[employer], [employer], [employer]: www.nhlbi.nih.gov/health/health-topics/topics/dash/ °Document Released: 07/01/2011 Document Revised: 11/26/2013 Document Reviewed: 05/16/2013 °[employer]® Patient Information ©2015 [employer]. This information is not intended to replace advice given to you by your health care provider. Make sure you discuss any questions you have with your health care provider. ° °

## 2014-10-17 ENCOUNTER — Telehealth: Payer: Self-pay

## 2014-10-17 NOTE — Telephone Encounter (Signed)
-----   Message from Lorayne Marek, MD sent at 10/16/2014  1:33 PM EDT ----- Call and let the Patient know that blood work is normal.

## 2014-10-17 NOTE — Telephone Encounter (Signed)
Patient is aware of his lab results 

## 2015-01-15 ENCOUNTER — Ambulatory Visit: Payer: Self-pay | Attending: Internal Medicine | Admitting: Internal Medicine

## 2015-01-15 ENCOUNTER — Encounter: Payer: Self-pay | Admitting: Internal Medicine

## 2015-01-15 VITALS — BP 122/86 | HR 89 | Temp 98.0°F | Resp 16 | Wt 148.8 lb

## 2015-01-15 DIAGNOSIS — I10 Essential (primary) hypertension: Secondary | ICD-10-CM | POA: Insufficient documentation

## 2015-01-15 DIAGNOSIS — Z79891 Long term (current) use of opiate analgesic: Secondary | ICD-10-CM | POA: Insufficient documentation

## 2015-01-15 DIAGNOSIS — F17200 Nicotine dependence, unspecified, uncomplicated: Secondary | ICD-10-CM

## 2015-01-15 DIAGNOSIS — M545 Low back pain: Secondary | ICD-10-CM

## 2015-01-15 DIAGNOSIS — M5126 Other intervertebral disc displacement, lumbar region: Secondary | ICD-10-CM | POA: Insufficient documentation

## 2015-01-15 DIAGNOSIS — F17210 Nicotine dependence, cigarettes, uncomplicated: Secondary | ICD-10-CM | POA: Insufficient documentation

## 2015-01-15 DIAGNOSIS — Z720 Tobacco use: Secondary | ICD-10-CM

## 2015-01-15 DIAGNOSIS — Z791 Long term (current) use of non-steroidal anti-inflammatories (NSAID): Secondary | ICD-10-CM | POA: Insufficient documentation

## 2015-01-15 DIAGNOSIS — G8929 Other chronic pain: Secondary | ICD-10-CM | POA: Insufficient documentation

## 2015-01-15 DIAGNOSIS — Z792 Long term (current) use of antibiotics: Secondary | ICD-10-CM | POA: Insufficient documentation

## 2015-01-15 MED ORDER — CYCLOBENZAPRINE HCL 10 MG PO TABS: 10.0000 mg | ORAL_TABLET | Freq: Three times a day (TID) | ORAL | Status: DC | PRN

## 2015-01-15 MED ORDER — TRAMADOL HCL 50 MG PO TABS: 50.0000 mg | ORAL_TABLET | Freq: Two times a day (BID) | ORAL | Status: DC | PRN

## 2015-01-15 NOTE — Progress Notes (Signed)
Patient is here for follow up on his back pain

## 2015-01-15 NOTE — Progress Notes (Signed)
MRN: [id number] Name: Calvin [name]  Sex: male Age: 54 y.o. DOB: [birth year]  Allergies: Review of patient's allergies indicates no known allergies.  Chief Complaint  Patient presents with  . Follow-up    HPI: Patient is 54 y.o. male who has history of hypertension and has been taking his blood pressure medication, he also reports history of chronic low back pain, in the past he has followed up with sports medicine,apparently patient was supposed to get lumbar facet injections and was referred by sports medicine but he has not scheduled yet, today is requesting refill on pain medication.  Past Medical History  Diagnosis Date  . Hypertension   . Lumbar herniated disc     Past Surgical History  Procedure Laterality Date  . Foot surgery      right, ~ 2002      Medication List       This list is accurate as of: 01/15/15  5:39 PM.  Always use your most recent med list.               amLODipine 5 MG tablet  Commonly known as:  NORVASC  Take 1 tablet (5 mg total) by mouth daily.     cephALEXin 500 MG capsule  Commonly known as:  KEFLEX     clindamycin 150 MG capsule  Commonly known as:  CLEOCIN  Take 1 capsule (150 mg total) by mouth every 6 (six) hours.     cyclobenzaprine 10 MG tablet  Commonly known as:  FLEXERIL  Take 1 tablet (10 mg total) by mouth 3 (three) times daily as needed for muscle spasms.     gabapentin 300 MG capsule  Commonly known as:  NEURONTIN  Take 1 capsule (300 mg total) by mouth at bedtime.     HYDROcodone-acetaminophen 5-325 MG per tablet  Commonly known as:  NORCO/VICODIN  Take 1-2 tablets every 6 hours as needed for severe pain     HYDROcodone-acetaminophen 5-325 MG per tablet  Commonly known as:  NORCO/VICODIN  Take 1 tablet by mouth every 6 (six) hours as needed.     ibuprofen 800 MG tablet  Commonly known as:  ADVIL,MOTRIN     ibuprofen 600 MG tablet  Commonly known as:  ADVIL,MOTRIN  Take 1 tablet (600 mg total) by mouth  every 6 (six) hours as needed.     methocarbamol 500 MG tablet  Commonly known as:  ROBAXIN  Take 1 tablet (500 mg total) by mouth 2 (two) times daily.     methylPREDNIsolone 4 MG tablet  Commonly known as:  MEDROL DOSPACK     naproxen 500 MG tablet  Commonly known as:  NAPROSYN  Take 1 tablet (500 mg total) by mouth 2 (two) times daily.     ondansetron 4 MG tablet  Commonly known as:  ZOFRAN  Take 1 tablet (4 mg total) by mouth every 6 (six) hours.     oxyCODONE-acetaminophen 5-325 MG per tablet  Commonly known as:  PERCOCET/ROXICET     predniSONE 20 MG tablet  Commonly known as:  DELTASONE  3 Tabs PO Days 1-3, then 2 tabs PO Days 4-6, then 1 tab PO Day 7-9, then Half Tab PO Day 10-12     sildenafil 100 MG tablet  Commonly known as:  VIAGRA  Take 0.5-1 tablets (50-100 mg total) by mouth daily as needed for erectile dysfunction.     sulfamethoxazole-trimethoprim 800-160 MG per tablet  Commonly known as:  BACTRIM DS,SEPTRA DS  traMADol 50 MG tablet  Commonly known as:  ULTRAM  Take 1 tablet (50 mg total) by mouth every 12 (twelve) hours as needed.        Meds ordered this encounter  Medications  . cyclobenzaprine (FLEXERIL) 10 MG tablet    Sig: Take 1 tablet (10 mg total) by mouth 3 (three) times daily as needed for muscle spasms.    Dispense:  60 tablet    Refill:  0  . traMADol (ULTRAM) 50 MG tablet    Sig: Take 1 tablet (50 mg total) by mouth every 12 (twelve) hours as needed.    Dispense:  60 tablet    Refill:  0    Immunization History  Administered Date(s) Administered  . Influenza,inj,Quad PF,36+ Mos 06/04/2014  . Pneumococcal Polysaccharide-23 06/04/2014    Family History  Problem Relation Age of Onset  . Diabetes Mother   . Hypertension Mother   . Heart disease Mother   . Hyperlipidemia Mother   . Diabetes Brother   . Diabetes Daughter   . Hypertension Sister   . Diabetes Sister     History  Substance Use Topics  . Smoking status:  Current Every Day Smoker -- 0.50 packs/day for 20 years    Types: Cigarettes  . Smokeless tobacco: Not on file  . Alcohol Use: Yes     Comment: occasional    Review of Systems   As noted in HPI  Filed Vitals:   01/15/15 1638  BP: 122/86  Pulse: 89  Temp: 98 F (36.7 C)  Resp: 16    Physical Exam  Physical Exam  Constitutional: No distress.  Eyes: EOM are normal. Pupils are equal, round, and reactive to light.  Cardiovascular: Normal rate and regular rhythm.   Pulmonary/Chest: Breath sounds normal. No respiratory distress. He has no wheezes. He has no rales.    CBC    Component Value Date/Time   WBC 13.1* 05/03/2014 1411   RBC 4.28 05/03/2014 1411   HGB 12.8* 05/03/2014 1411   HCT 37.7* 05/03/2014 1411   PLT 257 05/03/2014 1411   MCV 88.1 05/03/2014 1411   LYMPHSABS 1.8 05/03/2014 1411   MONOABS 0.8 05/03/2014 1411   EOSABS 0.1 05/03/2014 1411   BASOSABS 0.0 05/03/2014 1411    CMP     Component Value Date/Time   NA 138 10/15/2014 1459   K 5.3 10/15/2014 1459   CL 106 10/15/2014 1459   CO2 30 10/15/2014 1459   GLUCOSE 72 10/15/2014 1459   BUN 10 10/15/2014 1459   CREATININE 1.03 10/15/2014 1459   CREATININE 0.88 04/29/2014 1958   CALCIUM 9.0 10/15/2014 1459   PROT 6.9 10/15/2014 1459   ALBUMIN 4.0 10/15/2014 1459   AST 21 10/15/2014 1459   ALT 21 10/15/2014 1459   ALKPHOS 89 10/15/2014 1459   BILITOT 0.4 10/15/2014 1459   GFRNONAA 83 10/15/2014 1459   GFRNONAA >90 04/29/2014 1958   GFRAA >89 10/15/2014 1459   GFRAA >90 04/29/2014 1958    Lab Results  Component Value Date/Time   CHOL 180 08/10/2013 12:12 PM    No results found for: HGBA1C  Lab Results  Component Value Date/Time   AST 21 10/15/2014 02:59 PM    Assessment and Plan  Essential hypertension Blood pressure is well-controlled continued current meds . Smoking Again counseled patient to quit smoking  Chronic low back pain - Plan: cyclobenzaprine (FLEXERIL) 10 MG tablet,  traMADol (ULTRAM) 50 MG tablet, Patient rescheduled appointment with sports medicine  Return in about 3 months (around 04/17/2015), or if symptoms worsen or fail to improve.   This note has been created with Surveyor, quantity. Any transcriptional errors are unintentional.    Lorayne Marek, MD

## 2015-01-24 ENCOUNTER — Other Ambulatory Visit: Payer: Self-pay | Admitting: *Deleted

## 2015-01-24 DIAGNOSIS — G8929 Other chronic pain: Secondary | ICD-10-CM

## 2015-01-24 DIAGNOSIS — M545 Low back pain: Principal | ICD-10-CM

## 2015-02-10 ENCOUNTER — Ambulatory Visit
Admission: RE | Admit: 2015-02-10 | Discharge: 2015-02-10 | Disposition: A | Discharge status: Home / Self Care | Payer: Self-pay | Source: Ambulatory Visit | Attending: Sports Medicine | Admitting: Sports Medicine

## 2015-02-10 DIAGNOSIS — M5416 Radiculopathy, lumbar region: Secondary | ICD-10-CM

## 2015-02-10 MED ORDER — METHYLPREDNISOLONE ACETATE 40 MG/ML INJ SUSP (RADIOLOG
120.0000 mg | Freq: Once | INTRAMUSCULAR | Status: AC
  Administered 2015-02-10: 120 mg via INTRA_ARTICULAR

## 2015-02-10 MED ORDER — IOHEXOL 180 MG/ML  SOLN
2.0000 mL | Freq: Once | INTRAMUSCULAR | Status: AC | PRN
  Administered 2015-02-10: 2 mL via INTRATHECAL

## 2015-02-10 NOTE — Discharge Instructions (Signed)

## 2015-05-12 ENCOUNTER — Encounter (HOSPITAL_COMMUNITY): Payer: Self-pay | Admitting: *Deleted

## 2015-05-12 ENCOUNTER — Emergency Department (HOSPITAL_COMMUNITY)
Admission: EM | Admit: 2015-05-12 | Discharge: 2015-05-12 | Disposition: A | Discharge status: Home / Self Care | Payer: Self-pay | Attending: Emergency Medicine | Admitting: Emergency Medicine

## 2015-05-12 DIAGNOSIS — Z791 Long term (current) use of non-steroidal anti-inflammatories (NSAID): Secondary | ICD-10-CM | POA: Insufficient documentation

## 2015-05-12 DIAGNOSIS — M25512 Pain in left shoulder: Secondary | ICD-10-CM | POA: Insufficient documentation

## 2015-05-12 DIAGNOSIS — M545 Low back pain: Secondary | ICD-10-CM | POA: Insufficient documentation

## 2015-05-12 DIAGNOSIS — Z720 Tobacco use: Secondary | ICD-10-CM | POA: Insufficient documentation

## 2015-05-12 DIAGNOSIS — R200 Anesthesia of skin: Secondary | ICD-10-CM | POA: Insufficient documentation

## 2015-05-12 DIAGNOSIS — M1990 Unspecified osteoarthritis, unspecified site: Secondary | ICD-10-CM | POA: Insufficient documentation

## 2015-05-12 DIAGNOSIS — Z79899 Other long term (current) drug therapy: Secondary | ICD-10-CM | POA: Insufficient documentation

## 2015-05-12 DIAGNOSIS — G8929 Other chronic pain: Secondary | ICD-10-CM | POA: Insufficient documentation

## 2015-05-12 DIAGNOSIS — M519 Unspecified thoracic, thoracolumbar and lumbosacral intervertebral disc disorder: Secondary | ICD-10-CM | POA: Insufficient documentation

## 2015-05-12 DIAGNOSIS — I10 Essential (primary) hypertension: Secondary | ICD-10-CM | POA: Insufficient documentation

## 2015-05-12 HISTORY — DX: Unspecified osteoarthritis, unspecified site: M19.90

## 2015-05-12 HISTORY — DX: Unspecified thoracic, thoracolumbar and lumbosacral intervertebral disc disorder: M51.9

## 2015-05-12 MED ORDER — TRAMADOL HCL 50 MG PO TABS: 50.0000 mg | ORAL_TABLET | Freq: Four times a day (QID) | ORAL | Status: DC | PRN

## 2015-05-12 MED ORDER — TRAMADOL HCL 50 MG PO TABS
50.0000 mg | ORAL_TABLET | Freq: Once | ORAL | Status: AC
  Administered 2015-05-12: 50 mg via ORAL
  Filled 2015-05-12: qty 1

## 2015-05-12 MED ORDER — CYCLOBENZAPRINE HCL 10 MG PO TABS
10.0000 mg | ORAL_TABLET | Freq: Once | ORAL | Status: AC
  Administered 2015-05-12: 10 mg via ORAL
  Filled 2015-05-12: qty 1

## 2015-05-12 MED ORDER — CYCLOBENZAPRINE HCL 10 MG PO TABS: 10.0000 mg | ORAL_TABLET | Freq: Two times a day (BID) | ORAL | Status: DC | PRN

## 2015-05-12 NOTE — Discharge Instructions (Signed)
Chronic Back Pain Follow up with your primary care physician for chronic back pain.  When back pain lasts longer than 3 months, it is called chronic back pain.People with chronic back pain often go through certain periods that are more intense (flare-ups).  CAUSES Chronic back pain can be caused by wear and tear (degeneration) on different structures in your back. These structures include:  The bones of your spine (vertebrae) and the joints surrounding your spinal cord and nerve roots (facets).  The strong, fibrous tissues that connect your vertebrae (ligaments). Degeneration of these structures may result in pressure on your nerves. This can lead to constant pain. HOME CARE INSTRUCTIONS  Avoid bending, heavy lifting, prolonged sitting, and activities which make the problem worse.  Take brief periods of rest throughout the day to reduce your pain. Lying down or standing usually is better than sitting while you are resting.  Take over-the-counter or prescription medicines only as directed by your caregiver. SEEK IMMEDIATE MEDICAL CARE IF:   You have weakness or numbness in one of your legs or feet.  You have trouble controlling your bladder or bowels.  You have nausea, vomiting, abdominal pain, shortness of breath, or fainting.   This information is not intended to replace advice given to you by your health care provider. Make sure you discuss any questions you have with your health care provider.   Document Released: 08/19/2004 Document Revised: 10/04/2011 Document Reviewed: 12/30/2014 Elsevier Interactive Patient Education [employer].

## 2015-05-12 NOTE — ED Notes (Signed)
Declined W/C at D/C and was escorted to lobby by RN. 

## 2015-05-12 NOTE — ED Notes (Signed)
Pt reports LT shoulder pain that started on SAt . And back pain . Pt reports he has AR  and disc problems . Pt has been taking Tylenol with out relief.

## 2015-05-12 NOTE — ED Provider Notes (Signed)
CSN: [id number]     Arrival date & time 05/12/15  0034 History   First MD Initiated Contact with Patient 05/12/15 0755     Chief Complaint  Patient presents with  . Shoulder Pain  . Back Pain     (Consider location/radiation/quality/duration/timing/severity/associated sxs/prior Treatment) HPI [name] is a 54 year old male with a history of hypertension and a lumbar herniated disc who presents for constant low back pain and left shoulder pain. He denies any fall or injury. He states he normally has pain due to rheumatoid arthritis but that it has been worse in the past 2 days. He states he ran out of his pain medication which includes tramadol and Flexeril which is prescribed by his primary care physician. He is also complaining of right leg numbness but states that this is not new for him. He states he has herniated disks in his back and has had an MRI done at [facility] as well as another facility. He says that he's been taking Tylenol for the past 2 days with minimal relief. He denies any fever, chills, IV drug use, history of cancer, night sweats, or bowel or bladder incontinence or retention. Past Medical History  Diagnosis Date  . Hypertension   . Lumbar herniated disc   . Arthritis   . Disc disorder    Past Surgical History  Procedure Laterality Date  . Foot surgery      right, ~ 2002   Family History  Problem Relation Age of Onset  . Diabetes Mother   . Hypertension Mother   . Heart disease Mother   . Hyperlipidemia Mother   . Diabetes Brother   . Diabetes Daughter   . Hypertension Sister   . Diabetes Sister    Social History  Substance Use Topics  . Smoking status: Current Every Day Smoker -- 0.50 packs/day for 20 years    Types: Cigarettes  . Smokeless tobacco: None  . Alcohol Use: Yes     Comment: occasional    Review of Systems  Constitutional: Negative for fever and chills.  Musculoskeletal: Positive for back pain. Negative for neck pain.  Neurological:  Positive for numbness. Negative for weakness.  All other systems reviewed and are negative.     Allergies  Review of patient's allergies indicates no known allergies.  Home Medications   Prior to Admission medications   Medication Sig Start Date End Date Taking? Authorizing Provider  amLODipine (NORVASC) 5 MG tablet Take 1 tablet (5 mg total) by mouth daily. 06/04/14   Lorayne Marek, MD  cephALEXin (KEFLEX) 500 MG capsule  05/03/14   Historical Provider, MD  clindamycin (CLEOCIN) 150 MG capsule Take 1 capsule (150 mg total) by mouth every 6 (six) hours. Patient not taking: Reported on 07/05/2014 05/05/14   Delos Haring, PA-C  cyclobenzaprine (FLEXERIL) 10 MG tablet Take 1 tablet (10 mg total) by mouth 2 (two) times daily as needed for muscle spasms. 05/12/15   Nathalie Cavendish Patel-Mills, PA-C  gabapentin (NEURONTIN) 300 MG capsule Take 1 capsule (300 mg total) by mouth at bedtime. 08/06/14   John C Pick-Jacobs, DO  HYDROcodone-acetaminophen (NORCO/VICODIN) 5-325 MG per tablet Take 1 tablet by mouth every 6 (six) hours as needed. 07/05/14   Varney Biles, MD  ibuprofen (ADVIL,MOTRIN) 800 MG tablet  04/30/14   Historical Provider, MD  methocarbamol (ROBAXIN) 500 MG tablet Take 1 tablet (500 mg total) by mouth 2 (two) times daily. 07/05/14   Varney Biles, MD  methylPREDNIsolone (MEDROL DOSPACK) 4 MG tablet  04/30/14   Historical Provider, MD  naproxen (NAPROSYN) 500 MG tablet Take 1 tablet (500 mg total) by mouth 2 (two) times daily. 06/27/14   Carlisle Cater, PA-C  ondansetron (ZOFRAN) 4 MG tablet Take 1 tablet (4 mg total) by mouth every 6 (six) hours. Patient not taking: Reported on 07/05/2014 05/05/14   Delos Haring, PA-C  oxyCODONE-acetaminophen (PERCOCET/ROXICET) 5-325 MG per tablet  04/30/14   Historical Provider, MD  predniSONE (DELTASONE) 20 MG tablet 3 Tabs PO Days 1-3, then 2 tabs PO Days 4-6, then 1 tab PO Day 7-9, then Half Tab PO Day 10-12 Patient not taking: Reported on 07/05/2014  06/27/14   Carlisle Cater, PA-C  sildenafil (VIAGRA) 100 MG tablet Take 0.5-1 tablets (50-100 mg total) by mouth daily as needed for erectile dysfunction. 06/12/14   Lorayne Marek, MD  sulfamethoxazole-trimethoprim (BACTRIM DS,SEPTRA DS) 800-160 MG per tablet  05/03/14   Historical Provider, MD  traMADol (ULTRAM) 50 MG tablet Take 1 tablet (50 mg total) by mouth every 6 (six) hours as needed. 05/12/15   Lachell Rochette Patel-Mills, PA-C   BP 147/98 mmHg  Pulse 77  Temp(Src) 98 F (36.7 C) (Oral)  Resp 16  Ht 6\' 1"  (1.854 m)  Wt 160 lb (72.576 kg)  BMI 21.11 kg/m2  SpO2 100% Physical Exam  Constitutional: He is oriented to person, place, and time. Vital signs are normal. He appears well-developed and well-nourished.  Ambulatory with a cane.  HENT:  Head: Normocephalic and atraumatic.  Eyes: Conjunctivae are normal.  Neck: Normal range of motion. Neck supple.  Cardiovascular: Normal rate.   Pulmonary/Chest: Effort normal.  Abdominal: Soft.  Musculoskeletal: Normal range of motion.  Lower back: He is able to plantar and dorsiflex with out difficulty. He is able to straight leg raise bilaterally to 90. No saddle anesthesia. No midline lumbar vertebral tenderness. No surgical scars noted. He is ambulatory.  Left shoulder: He has good range of motion of the shoulder. No joint swelling.  Neurological: He is alert and oriented to person, place, and time. GCS eye subscore is 4. GCS verbal subscore is 5. GCS motor subscore is 6.  Skin: Skin is warm and dry.  Psychiatric: He has a normal mood and affect. His behavior is normal.    ED Course  Procedures (including critical care time) Labs Review Labs Reviewed - No data to display  Imaging Review No results found.   EKG Interpretation None      MDM   Final diagnoses:  Chronic low back pain  Patient has a history of chronic back pain and has had an MRI of the lumbar and thoracic spine in 2015. He states he is normally followed by his primary  care physician but has not seen him in several months since his physician changed practices. He says he was able to establish another physician but cannot see him for over a month.  He has no red flags such as bowel or bladder incontinence or retention, IV drug use, history of cancer, night sweats, fever, or loss of motor function. He states he ran out of his pain medications which include tramadol and Flexeril.  Per patient history he had steroid injections in the lumbar spine in July 2016. I discussed return precautions with the patient. He was given a one week supply of Flexeril and tramadol. I also discussed that additional chronic pain medication would not be prescribed from the ED and that he would need to follow up with his primary care physician. Patient verbally  agrees with the plan. Medications  traMADol (ULTRAM) tablet 50 mg (50 mg Oral Given 05/12/15 0814)  cyclobenzaprine (FLEXERIL) tablet 10 mg (10 mg Oral Given 05/12/15 0814)     Ottie Glazier, PA-C 05/12/15 0827  Elnora Morrison, MD 05/14/15 [phone]

## 2015-05-28 ENCOUNTER — Encounter: Payer: Self-pay | Admitting: Internal Medicine

## 2015-05-28 ENCOUNTER — Ambulatory Visit: Payer: Self-pay | Attending: Internal Medicine | Admitting: Internal Medicine

## 2015-05-28 VITALS — BP 147/97 | HR 91 | Temp 98.6°F | Resp 16 | Ht 73.0 in | Wt 158.4 lb

## 2015-05-28 DIAGNOSIS — Z0000 Encounter for general adult medical examination without abnormal findings: Secondary | ICD-10-CM | POA: Insufficient documentation

## 2015-05-28 DIAGNOSIS — N529 Male erectile dysfunction, unspecified: Secondary | ICD-10-CM

## 2015-05-28 DIAGNOSIS — Z79899 Other long term (current) drug therapy: Secondary | ICD-10-CM | POA: Insufficient documentation

## 2015-05-28 DIAGNOSIS — I10 Essential (primary) hypertension: Secondary | ICD-10-CM | POA: Insufficient documentation

## 2015-05-28 DIAGNOSIS — Z720 Tobacco use: Secondary | ICD-10-CM

## 2015-05-28 DIAGNOSIS — F17200 Nicotine dependence, unspecified, uncomplicated: Secondary | ICD-10-CM

## 2015-05-28 DIAGNOSIS — M5416 Radiculopathy, lumbar region: Secondary | ICD-10-CM | POA: Insufficient documentation

## 2015-05-28 MED ORDER — AMLODIPINE BESYLATE 5 MG PO TABS: 5.0000 mg | ORAL_TABLET | Freq: Every day | ORAL | Status: DC

## 2015-05-28 MED ORDER — TRAMADOL HCL 50 MG PO TABS: 50.0000 mg | ORAL_TABLET | Freq: Four times a day (QID) | ORAL | Status: DC | PRN

## 2015-05-28 MED ORDER — CYCLOBENZAPRINE HCL 10 MG PO TABS: 10.0000 mg | ORAL_TABLET | Freq: Two times a day (BID) | ORAL | Status: DC | PRN

## 2015-05-28 MED ORDER — SILDENAFIL CITRATE 100 MG PO TABS: 50.0000 mg | ORAL_TABLET | Freq: Every day | ORAL | Status: DC | PRN

## 2015-05-28 NOTE — Progress Notes (Signed)
Patient ID: Calvin [name], male   DOB: 03/31/1961, 54 y.o.   MRN: [id number] Subjective:  Calvin [name] is a 54 y.o. male with hypertension, tobacco use, and arthritis. Patient reports that he would like a prescribed cane for mobility. States that his current cane broke in the store few days ago. Has 2 herniated disc and has weakness and stiffness in the mornings. Has been to Sports Medicine and physical therapy but was told that he would require surgery and he is uninsured. He currently smokes 0.5 ppd. Has tried nicotine patches in the past. He reports that he will soon try to quit smoking but right now he is stressed and it helps him. He admits to not taking BP medication today because he left it at his sister's house 3 days ago.   Current Outpatient Prescriptions  Medication Sig Dispense Refill  . amLODipine (NORVASC) 5 MG tablet Take 1 tablet (5 mg total) by mouth daily. 90 tablet 3  . cyclobenzaprine (FLEXERIL) 10 MG tablet Take 1 tablet (10 mg total) by mouth 2 (two) times daily as needed for muscle spasms. 20 tablet 0  . ibuprofen (ADVIL,MOTRIN) 800 MG tablet   0  . sildenafil (VIAGRA) 100 MG tablet Take 0.5-1 tablets (50-100 mg total) by mouth daily as needed for erectile dysfunction. 30 tablet 3  . traMADol (ULTRAM) 50 MG tablet Take 1 tablet (50 mg total) by mouth every 6 (six) hours as needed. 15 tablet 0  . cephALEXin (KEFLEX) 500 MG capsule   0  . clindamycin (CLEOCIN) 150 MG capsule Take 1 capsule (150 mg total) by mouth every 6 (six) hours. (Patient not taking: Reported on 07/05/2014) 28 capsule 0  . gabapentin (NEURONTIN) 300 MG capsule Take 1 capsule (300 mg total) by mouth at bedtime. (Patient not taking: Reported on 05/28/2015) 30 capsule 2  . HYDROcodone-acetaminophen (NORCO/VICODIN) 5-325 MG per tablet Take 1 tablet by mouth every 6 (six) hours as needed. (Patient not taking: Reported on 05/28/2015) 15 tablet 0  . methocarbamol (ROBAXIN) 500 MG tablet Take 1 tablet (500 mg total) by mouth 2  (two) times daily. (Patient not taking: Reported on 05/28/2015) 20 tablet 0  . methylPREDNIsolone (MEDROL DOSPACK) 4 MG tablet   0  . naproxen (NAPROSYN) 500 MG tablet Take 1 tablet (500 mg total) by mouth 2 (two) times daily. (Patient not taking: Reported on 05/28/2015) 20 tablet 0  . ondansetron (ZOFRAN) 4 MG tablet Take 1 tablet (4 mg total) by mouth every 6 (six) hours. (Patient not taking: Reported on 07/05/2014) 12 tablet 0  . oxyCODONE-acetaminophen (PERCOCET/ROXICET) 5-325 MG per tablet   0  . predniSONE (DELTASONE) 20 MG tablet 3 Tabs PO Days 1-3, then 2 tabs PO Days 4-6, then 1 tab PO Day 7-9, then Half Tab PO Day 10-12 (Patient not taking: Reported on 07/05/2014) 20 tablet 0  . sulfamethoxazole-trimethoprim (BACTRIM DS,SEPTRA DS) 800-160 MG per tablet   0   No current facility-administered medications for this visit.    Hypertension ROS: not taking medications regularly as instructed, no medication side effects noted, no TIA's, no chest pain on exertion, no dyspnea on exertion, no swelling of ankles, no orthostatic dizziness or lightheadedness and no palpitations.   Objective:  BP 147/97 mmHg  Pulse 91  Temp(Src) 98.6 F (37 C)  Resp 16  Ht 6\' 1"  (1.854 m)  Wt 158 lb 6.4 oz (71.85 kg)  BMI 20.90 kg/m2  SpO2 100%  Appearance alert, well appearing, and in no distress, oriented to  person, place, and time and normal appearing weight. General exam BP noted to be well controlled today in office, S1, S2 normal, no gallop, no murmur, chest clear, no JVD, no HSM, no edema. Lumbar spine tenderness, mobile with cane, pain with LE ROM.  Lab review: labs are reviewed, up to date and normal.   Assessment:   [name] was seen today for follow-up.  Diagnoses and all orders for this visit:  Essential hypertension -     amLODipine (NORVASC) 5 MG tablet; Take 1 tablet (5 mg total) by mouth daily. Patients BP is elevated in office today but has been out of medication. He will need further  observation. I will continue to monitor patient on subsequent visits.   Lumbar radiculopathy -     cyclobenzaprine (FLEXERIL) 10 MG tablet; Take 1 tablet (10 mg total) by mouth 2 (two) times daily as needed for muscle spasms. -     traMADol (ULTRAM) 50 MG tablet; Take 1 tablet (50 mg total) by mouth every 6 (six) hours as needed. -     Cane adjustable single point  Erectile dysfunction, unspecified erectile dysfunction type -     sildenafil (VIAGRA) 100 MG tablet; Take 0.5-1 tablets (50-100 mg total) by mouth daily as needed for erectile dysfunction. Stable, meds refilled  Smoking Smoking cessation discussed for 3 minutes, patient is not willing to quit at this time. Will continue to assess on each visit. Discussed increased risk for diseases such as cancer, heart disease, and stroke.   Healthcare maintenance -     Flu Vaccine QUAD 36+ mos PF IM (Fluarix & Fluzone Quad PF)   .Return in about 3 months (around 08/28/2015) for Hypertension.  Lance Bosch, NP 05/28/2015 6:04 PM

## 2015-05-28 NOTE — Progress Notes (Signed)
Patient here for follow up on his arthritis and HTN Patient is requesting a prescription to obtain a new cane

## 2015-05-28 NOTE — Patient Instructions (Signed)
Smoking Cessation, Tips for Success If you are ready to quit smoking, congratulations! You have chosen to help yourself be healthier. Cigarettes bring nicotine, tar, carbon monoxide, and other irritants into your body. Your lungs, heart, and blood vessels will be able to work better without these poisons. There are many different ways to quit smoking. Nicotine gum, nicotine patches, a nicotine inhaler, or nicotine nasal spray can help with physical craving. Hypnosis, support groups, and medicines help break the habit of smoking. WHAT THINGS CAN I DO TO MAKE QUITTING EASIER?  Here are some tips to help you quit for good:  Pick a date when you will quit smoking completely. Tell all of your friends and family about your plan to quit on that date.  Do not try to slowly cut down on the number of cigarettes you are smoking. Pick a quit date and quit smoking completely starting on that day.  Throw away all cigarettes.   Clean and remove all ashtrays from your home, work, and car.  On a card, write down your reasons for quitting. Carry the card with you and read it when you get the urge to smoke.  Cleanse your body of nicotine. Drink enough water and fluids to keep your urine clear or pale yellow. Do this after quitting to flush the nicotine from your body.  Learn to predict your moods. Do not let a bad situation be your excuse to have a cigarette. Some situations in your life might tempt you into wanting a cigarette.  Never have "just one" cigarette. It leads to wanting another and another. Remind yourself of your decision to quit.  Change habits associated with smoking. If you smoked while driving or when feeling stressed, try other activities to replace smoking. Stand up when drinking your coffee. Brush your teeth after eating. Sit in a different chair when you read the paper. Avoid alcohol while trying to quit, and try to drink fewer caffeinated beverages. Alcohol and caffeine may urge you to  smoke.  Avoid foods and drinks that can trigger a desire to smoke, such as sugary or spicy foods and alcohol.  Ask people who smoke not to smoke around you.  Have something planned to do right after eating or having a cup of coffee. For example, plan to take a walk or exercise.  Try a relaxation exercise to calm you down and decrease your stress. Remember, you may be tense and nervous for the first 2 weeks after you quit, but this will pass.  Find new activities to keep your hands busy. Play with a pen, coin, or rubber band. Doodle or draw things on paper.  Brush your teeth right after eating. This will help cut down on the craving for the taste of tobacco after meals. You can also try mouthwash.   Use oral substitutes in place of cigarettes. Try using lemon drops, carrots, cinnamon sticks, or chewing gum. Keep them handy so they are available when you have the urge to smoke.  When you have the urge to smoke, try deep breathing.  Designate your home as a nonsmoking area.  If you are a heavy smoker, ask your health care provider about a prescription for nicotine chewing gum. It can ease your withdrawal from nicotine.  Reward yourself. Set aside the cigarette money you save and buy yourself something nice.  Look for support from others. Join a support group or smoking cessation program. Ask someone at home or at work to help you with your plan   to quit smoking.  Always ask yourself, "Do I need this cigarette or is this just a reflex?" Tell yourself, "Today, I choose not to smoke," or "I do not want to smoke." You are reminding yourself of your decision to quit.  Do not replace cigarette smoking with electronic cigarettes (commonly called e-cigarettes). The safety of e-cigarettes is unknown, and some may contain harmful chemicals.  If you relapse, do not give up! Plan ahead and think about what you will do the next time you get the urge to smoke. HOW WILL I FEEL WHEN I QUIT SMOKING? You  may have symptoms of withdrawal because your body is used to nicotine (the addictive substance in cigarettes). You may crave cigarettes, be irritable, feel very hungry, cough often, get headaches, or have difficulty concentrating. The withdrawal symptoms are only temporary. They are strongest when you first quit but will go away within 10-14 days. When withdrawal symptoms occur, stay in control. Think about your reasons for quitting. Remind yourself that these are signs that your body is healing and getting used to being without cigarettes. Remember that withdrawal symptoms are easier to treat than the major diseases that smoking can cause.  Even after the withdrawal is over, expect periodic urges to smoke. However, these cravings are generally short lived and will go away whether you smoke or not. Do not smoke! WHAT RESOURCES ARE AVAILABLE TO HELP ME QUIT SMOKING? Your health care provider can direct you to community resources or hospitals for support, which may include:  Group support.  Education.  Hypnosis.  Therapy.   This information is not intended to replace advice given to you by your health care provider. Make sure you discuss any questions you have with your health care provider.   Document Released: 04/09/2004 Document Revised: 08/02/2014 Document Reviewed: 12/28/2012 Elsevier Interactive Patient Education 2016 [employer]  

## 2015-06-26 ENCOUNTER — Telehealth: Payer: Self-pay | Admitting: Internal Medicine

## 2015-06-26 ENCOUNTER — Encounter (HOSPITAL_COMMUNITY): Payer: Self-pay | Admitting: Emergency Medicine

## 2015-06-26 ENCOUNTER — Emergency Department (HOSPITAL_COMMUNITY)
Admission: EM | Admit: 2015-06-26 | Discharge: 2015-06-26 | Disposition: A | Discharge status: Home / Self Care | Payer: Self-pay | Attending: Emergency Medicine | Admitting: Emergency Medicine

## 2015-06-26 DIAGNOSIS — I10 Essential (primary) hypertension: Secondary | ICD-10-CM | POA: Insufficient documentation

## 2015-06-26 DIAGNOSIS — F17210 Nicotine dependence, cigarettes, uncomplicated: Secondary | ICD-10-CM | POA: Insufficient documentation

## 2015-06-26 DIAGNOSIS — Z23 Encounter for immunization: Secondary | ICD-10-CM | POA: Insufficient documentation

## 2015-06-26 DIAGNOSIS — L03113 Cellulitis of right upper limb: Secondary | ICD-10-CM | POA: Insufficient documentation

## 2015-06-26 DIAGNOSIS — Z79899 Other long term (current) drug therapy: Secondary | ICD-10-CM | POA: Insufficient documentation

## 2015-06-26 DIAGNOSIS — L02413 Cutaneous abscess of right upper limb: Secondary | ICD-10-CM | POA: Insufficient documentation

## 2015-06-26 DIAGNOSIS — M1990 Unspecified osteoarthritis, unspecified site: Secondary | ICD-10-CM | POA: Insufficient documentation

## 2015-06-26 DIAGNOSIS — R000 Tachycardia, unspecified: Secondary | ICD-10-CM | POA: Insufficient documentation

## 2015-06-26 MED ORDER — TETANUS-DIPHTH-ACELL PERTUSSIS 5-2.5-18.5 LF-MCG/0.5 IM SUSP
0.5000 mL | Freq: Once | INTRAMUSCULAR | Status: AC
  Administered 2015-06-26: 0.5 mL via INTRAMUSCULAR
  Filled 2015-06-26: qty 0.5

## 2015-06-26 MED ORDER — CEPHALEXIN 500 MG PO CAPS: 500.0000 mg | ORAL_CAPSULE | Freq: Four times a day (QID) | ORAL | Status: DC

## 2015-06-26 MED ORDER — LIDOCAINE-EPINEPHRINE (PF) 2 %-1:200000 IJ SOLN
10.0000 mL | Freq: Once | INTRAMUSCULAR | Status: AC
  Administered 2015-06-26: 10 mL
  Filled 2015-06-26: qty 20

## 2015-06-26 NOTE — ED Notes (Signed)
Pt st's he has had a abscess at right ac area for approx 2 months.  St's are is now starting to get sore and bigger

## 2015-06-26 NOTE — ED Notes (Signed)
I&D tray at bedside.

## 2015-06-26 NOTE — ED Notes (Signed)
Pt has a harden area at the right ac area.  St's it has been there for appox 2 months but now is starting to be painful.

## 2015-06-26 NOTE — Discharge Instructions (Signed)
Read the information below.  Use the prescribed medication as directed.  Please discuss all new medications with your pharmacist.  You may return to the Emergency Department at any time for worsening condition or any new symptoms that concern you.    If you develop increased redness, swelling, uncontrolled pain, or fevers greater than 100.4, return to the ER immediately for a recheck.     Cellulitis Cellulitis is an infection of the skin and the tissue under the skin. The infected area is usually red and tender. This happens most often in the arms and lower legs. HOME CARE   Take your antibiotic medicine as told. Finish the medicine even if you start to feel better.  Keep the infected arm or leg raised (elevated).  Put a warm cloth on the area up to 4 times per day.  Only take medicines as told by your doctor.  Keep all doctor visits as told. GET HELP IF:  You see red streaks on the skin coming from the infected area.  Your red area gets bigger or turns a dark color.  Your bone or joint under the infected area is painful after the skin heals.  Your infection comes back in the same area or different area.  You have a puffy (swollen) bump in the infected area.  You have new symptoms.  You have a fever. GET HELP RIGHT AWAY IF:   You feel very sleepy.  You throw up (vomit) or have watery poop (diarrhea).  You feel sick and have muscle aches and pains.   This information is not intended to replace advice given to you by your health care provider. Make sure you discuss any questions you have with your health care provider.   Document Released: 12/29/2007 Document Revised: 04/02/2015 Document Reviewed: 09/27/2011 Elsevier Interactive Patient Education 2016 [employer].    Emergency Department Resource Guide 1) Find a Doctor and Pay Out of Pocket Although you won't have to find out who is covered by your insurance plan, it is a good idea to ask around and get  recommendations. You will then need to call the office and see if the doctor you have chosen will accept you as a new patient and what types of options they offer for patients who are self-pay. Some doctors offer discounts or will set up payment plans for their patients who do not have insurance, but you will need to ask so you aren't surprised when you get to your appointment.  2) Contact Your Local Health Department Not all health departments have doctors that can see patients for sick visits, but many do, so it is worth a call to see if yours does. If you don't know where your local health department is, you can check in your phone book. The CDC also has a tool to help you locate your state's health department, and many state websites also have listings of all of their local health departments.  3) Find a [facility] If your illness is not likely to be very severe or complicated, you may want to try a walk in clinic. These are popping up all over the country in pharmacies, drugstores, and shopping centers. They're usually staffed by nurse practitioners or physician assistants that have been trained to treat common illnesses and complaints. They're usually fairly quick and inexpensive. However, if you have serious medical issues or chronic medical problems, these are probably not your best option.  No Primary Care Doctor: - Call Health Connect at  [phone] - they can help you locate a primary care doctor that  accepts your insurance, provides certain services, etc. - Physician Referral Service- [phone]  Chronic Pain Problems: Organization         Address  Phone   Notes  [facility]  [phone] Patients need to be referred by their primary care doctor.   Medication Assistance: Organization         Address  Phone   Notes  [facility] Medication [facility] [address] [place], [place] [address] [phone] --Must be a resident of  [facility] -- Must have NO insurance coverage whatsoever (no Medicaid/ Medicare, etc.) -- The pt. MUST have a primary care doctor that directs their care regularly and follows them in the community   MedAssist  [phone]   [employer]  [phone]    Agencies that provide inexpensive medical care: Organization         Address  Phone   Notes  [place]  [phone]   [facility]    [phone]   Mikeal [facility] [place], [place] [address] [phone]   [place] [address], [place] [phone]   Planned Parenthood    [phone]   [facility]    [phone]   [place] and [place] [address], [place] Phone:  [phone], Fax:  [phone] Hours of Operation:  9 am - 6 pm, M-F.  Also accepts Medicaid/Medicare and self-pay.  [facility] for [place] [address], [address], [place] Phone: [phone], Fax: [phone]. Hours of Operation:  8:30 am - 5:30 pm, M-F.  Also accepts Medicaid and self-pay.  [facility] [place] [address], [place] Phone: [phone]   [place], [place], [place] [phone], Ext. [phone] Mondays & Thursdays: 7-9 AM.  First 15 patients are seen on a first come, first serve basis.    [place] Providers:  Organization         Address  Phone   Notes  [facility] [address], [place] [phone] Also accepts self-pay patients.  [facility] [id number] [place], [place]  [phone]   [place], [address], [place] [phone]   [facility] [address], [place] [phone]   Lucianne Lei [address], [place]   [phone] Only accepts [place] Access  [place] patients after they have their name applied to their card.   Self-Pay (no insurance) in [facility]:  Organization         Address  Phone   Notes  Sickle Cell Patients, [facility] [place] [phone]   [facility] [place] [phone]   [facility] [place]  [place], [place], [place] [phone]   [facility]/Dr. Osei-Bonsu  [address] [place] or [address], [place] [phone] Phone number for both [place] and [place] locations is the same.  Urgent Medical and [facility] [address], [place] [phone]   [facility] [place]  or [address] [phone]   [facility] [place] [phone], phone; [phone], fax Sees patients 1st and 3rd Saturday of every month.  Must not qualify for public or private insurance (i.e. Medicaid, Medicare, [place] Health Choice, Veterans' Benefits)  Household income should be no more than 200% of the poverty level The clinic cannot treat you if you are pregnant or think you are pregnant  Sexually transmitted diseases are not treated at the clinic.    Dental Care: Organization         Address  Phone  Notes  [facility] Department of [facility] [place] [phone] Accepts children up to age 66 who are enrolled in [place] or [place]; pregnant women with a Medicaid card; and children who have applied for Medicaid or [place] Health Choice, but were declined, whose parents can pay a reduced fee at time of service.  [facility] Department of [facility] [place]  [address], [place] [phone] Accepts children up to age 78 who are enrolled in [place] or [place]; pregnant women with a Medicaid  card; and children who have applied for Medicaid or  Health Choice, but were declined, whose parents can pay a reduced fee at time of service.  [facility] Adult Dental Access PROGRAM  [place] [phone] Patients are seen by appointment only. Walk-ins are not accepted. [place] will see patients 83 years of age and older. Monday - Tuesday (8am-5pm) Most Wednesdays (8:30-5pm) $30 per visit, cash only  [facility] Adult Dental Access PROGRAM  [address], [facility] [phone] Patients are seen by appointment only. Walk-ins are not accepted. [place] will see patients 43 years of age and older. One Wednesday Evening (Monthly: Volunteer Based).  $30 per visit, cash only  [place]  [phone] for adults; Children under age 49, call Graduate Pediatric Dentistry at [phone]. Children aged 68-14, please call [phone] to request a pediatric application.  Dental services are provided in all areas of dental care including fillings, crowns and bridges, complete and partial dentures, implants, gum treatment, root canals, and extractions. Preventive care is also provided. Treatment is provided to both adults and children. Patients are selected via a lottery and there is often a waiting list.   [facility] [address], [place]  [phone] [email or web address]   [facility] [address] [place], [place] [phone], Ext. [phone] Second and Fourth Thursday of each month, opens at 6:30 AM; Clinic ends at 9 AM.  Patients are seen on a first-come first-served basis, and a limited number are seen during each clinic.   [facility]  [address] Hillard Danker [place], [place] [phone]   Eligibility Requirements You must have lived in [place], [place], or [place] counties for at least the last three months.   You cannot be eligible for state or federal sponsored [employer],  including [employer], [place], or [employer].   You generally cannot be eligible for healthcare insurance through your employer.    How to apply: Eligibility screenings are held every Tuesday and Wednesday afternoon from 1:00 pm until 4:00 pm. You do not need an appointment for the interview!  [facility] [address] [place], [place]   [place]  [place] Department  [place]  [phone]    Behavioral Health Resources in the Community: Intensive Outpatient Programs Organization         Address  Phone  Notes  Constantine Benson. [address] [place], [place] [phone]   [facility] Outpatient [address] [place], [place]   ADS: Alcohol & Drug Svcs [address], [place], [place]   [place] [address],  [place], [place] or [phone]   Substance Abuse Resources Organization         Address  Phone  Notes  Alcohol and Drug Services  [phone]   [place]  [phone]   [facility] [place]   Chinita Pester  [phone]   Residential & Outpatient Substance Abuse Program  [phone]   Psychological Services Organization         Address  Phone  Notes  [facility]  [place]  [phone]   [place] [address] [place] or [phone]    Mobile Crisis Teams Organization         Address  Phone  Notes  Therapeutic Alternatives, Mobile Crisis Care Unit  [phone]   Assertive Psychotherapeutic Services  [address] [place], [place]   [place] Levels [address] [place] [phone]    Self-Help/Support Groups Organization         Address  Phone             Notes  [place].  of [place] - variety of support groups  [place] Call for more information  Narcotics Anonymous (NA), [facility]aring [facility] [address], [employer] [place]  2 meetings at this location   Special educational needs teacher         Address  Phone  Notes  ASAP Residential Treatment [place],    [place]  [phone]   [facility]  [address] [place] [id number], [place], [place]   [place], [place] [phone] Admissions: 8am-3pm M-F  Incentives Substance [place] [address],    [place], [place] [phone]   [facility] [address] [place], [place], [place]   [facility] [address],  [place], [place]   Insight Programs - Intensive Outpatient [address] Kristeen [address], [place], [place]   [facility] ([place].) [place].,  [place], [place] [phone] or [phone]   Residential Treatment Services (RTS) [address] [place], [place] Accepts Medicaid  [facility]ellowship [place] [address]  [place] [phone] Substance Abuse/Addiction Treatment   [facility] Organization         Address  Phone  Notes  [facility]  [phone]   Domenic Schwab, PhD [address] Arlis Porta [place], [place]   [phone] or [phone]   [place], [place] [phone]   [place] [address], [place], [place] [phone] Insurance/Medicaid/sponsorship through [employer] and Families [address] [id number]  [place], [place] [phone] [place], [place] [phone]    Dr. Adele Schilder  [phone]   [facility] Dept. 1) [address] [place] 2)  [place] 3)  [place] [address], [place] [phone]  [phone]   [place] [phone] or [phone] (After Hours)       [place] in the [facility]  Intensive Outpatient Programs: [facility]      [place]. [place], [place] Both a day and evening program       [facility] Outpatient     [address]        [place], [place] [address] [phone]         ADS: Alcohol & Drug Svcs [place]: [phone] or [phone] [address] [place], [place] [address] [email or web address]  Mobile Crisis Teams:                                        Therapeutic Alternatives         Mobile Crisis Care Unit [phone]             Assertive Psychotherapeutic Services [address] Lady Gary [place] [address] [place] [phone]  Self-Help/Support Groups: Mental Health Assoc. of [employer] of support groups [phone] (call for more info)  Narcotics Anonymous (NA) Caring Services [address] [place] - 2 meetings at this location  Residential Treatment Programs:  [place]       [place] [address] [place], [place]  [address] [place]  [address] [place], [place] [address] [phone] Admissions: 8am-3pm M-F  Incentives Substance [place]     [address] [place], [place] [address]       [phone]         [facility] [address] Jadene Pierini [place], [place]  [facility] [address] [place], [place]  Insight Programs - Intensive Outpatient      [address] [phone]     [place], [place]         [facility] ([place].)     [address] [place], [place] or [phone]  Residential Treatment Services ([place])  [place], [place]  Fellowship [place]  [place]: [place]- [phone]               General Therapy                                                Domenic Schwab, PhD        [address] [place], [place] [address]         [facility]   [address] [place], [place] [address] [phone]  [facility] Closed Recovery [address] [place], [place] [address] [phone] Insurance/Medicaid/sponsorship through [facility] and Families                                              [address] [place]                                        [place], [place] [address]    Therapy/tele-psych/case         [place] [address], [place]  [address]  Adolescent/group home/case management [phone]                                           Rosette Reveal PhD       General therapy       Insurance   [phone]         Dr. Adele Schilder Insurance [phone] M-F  [place] Detox/Residential Medicaid, sponsorship [phone]

## 2015-06-26 NOTE — ED Provider Notes (Signed)
CSN: [id number]     Arrival date & time 06/26/15  1531 History  By signing my name below, I, Irene Pap, attest that this documentation has been prepared under the direction and in the presence of Lasasha Brophy, PA-C. Electronically Signed: Irene Pap, ED Scribe. 06/26/2015. 4:48 PM.   Chief Complaint  Patient presents with  . Abscess   The history is provided by the patient. No language interpreter was used.  HPI Comments: Calvin [name] is a 54 y.o. male with a hx of HTN who presents to the Emergency Department complaining of an abscess to the right forearm near the elbow. Pt states that a non-painful, hard "knot" has been present for a month, but only became painful a few days ago. He reports blood drainage from the area and pain with palpation that he rates 6/10. 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 He reports hx of similar symptoms on other parts of his arm.  He denies fever, chills, chest pain, SOB, nausea, vomiting, color change, immunocompromised state, numbness, or weakness. Pt states that he is right handed and is not currently employed. He reports taking his HTN medication daily. He denies allergies to medications. Pt is not UTD on tdap.    Past Medical History  Diagnosis Date  . Hypertension   . Lumbar herniated disc   . Arthritis   . Disc disorder    Past Surgical History  Procedure Laterality Date  . Foot surgery      right, ~ 2002   Family History  Problem Relation Age of Onset  . Diabetes Mother   . Hypertension Mother   . Heart disease Mother   . Hyperlipidemia Mother   . Diabetes Brother   . Diabetes Daughter   . Hypertension Sister   . Diabetes Sister    Social History  Substance Use Topics  . Smoking status: Current Every Day Smoker -- 0.50 packs/day for 20 years    Types: Cigarettes  . Smokeless tobacco: Not on file  . Alcohol Use: Yes     Comment: occasional    Review of Systems   Constitutional: Negative for fever and chills.  Respiratory: Negative for shortness of breath.   Cardiovascular: Negative for chest pain.  Gastrointestinal: Negative for nausea and vomiting.  Skin: Positive for wound. Negative for color change.  Allergic/Immunologic: Negative for immunocompromised state.  Neurological: Negative for weakness and numbness.  Hematological: Does not bruise/bleed easily.  Psychiatric/Behavioral: Negative for self-injury.   Allergies  Review of patient's allergies indicates no known allergies.  Home Medications   Prior to Admission medications   Medication Sig Start Date End Date Taking? Authorizing Provider  amLODipine (NORVASC) 5 MG tablet Take 1 tablet (5 mg total) by mouth daily. 05/28/15   Lance Bosch, NP  cyclobenzaprine (FLEXERIL) 10 MG tablet Take 1 tablet (10 mg total) by mouth 2 (two) times daily as needed for muscle spasms. 05/28/15   Lance Bosch, NP  gabapentin (NEURONTIN) 300 MG capsule Take 1 capsule (300 mg total) by mouth at bedtime. Patient not taking: Reported on 05/28/2015 08/06/14   Elyse Jarvis Pick-Jacobs, DO  ibuprofen (ADVIL,MOTRIN) 800 MG tablet  04/30/14   Historical Provider, MD  sildenafil (VIAGRA) 100 MG tablet Take 0.5-1 tablets (50-100 mg total) by mouth daily as needed for erectile dysfunction. 05/28/15   Lance Bosch, NP  traMADol (ULTRAM) 50 MG tablet Take 1 tablet (50 mg total) by  mouth every 6 (six) hours as needed. 05/28/15   Lance Bosch, NP   BP 142/105 mmHg  Pulse 122  Temp(Src) 97.9 F (36.6 C) (Oral)  Resp 18  Ht 6' (1.829 m)  Wt 160 lb (72.576 kg)  BMI 21.70 kg/m2  SpO2 99%  Physical Exam  Constitutional: He appears well-developed and well-nourished. No distress.  HENT:  Head: Normocephalic and atraumatic.  Mouth/Throat: Oropharynx is clear and moist. No oropharyngeal exudate.  Neck: Normal range of motion. Neck supple.  Cardiovascular: Regular rhythm.  Tachycardia present.   Pulmonary/Chest: Effort normal  and breath sounds normal.  Musculoskeletal:  Firm oblong nodule over the proximal forearm, ventral aspect with overlying scab; mild erythema and tenderness to palpation. No active drainage. Full active ROM of elbow. Upper extremities:  Strength 5/5, sensation intact, distal pulses intact.    Neurological: He is alert.  Skin: Skin is warm and dry. He is not diaphoretic.  Nursing note and vitals reviewed.     ED Course  Procedures (including critical care time) DIAGNOSTIC STUDIES: Oxygen Saturation is 99% on RA, normal by my interpretation.    COORDINATION OF CARE: 4:15 PM-Discussed treatment plan which includes [name] of area, I&D, tdap and anti-biotics with pt at bedside and pt agreed to plan.   INCISION AND DRAINAGE PROCEDURE NOTE: Patient identification was confirmed and verbal consent was obtained. This procedure was performed by Clayton Bibles, PA-C at 4:38 PM. Site: right proximal forearm on the ventral aspect Sterile procedures observed Needle size: 25 Anesthetic used (type and amt): lidocaine 2% w/ epi; 2.5 ml Blade size: 11 Drainage: thick, bloody discharge with minimal pus Complexity: Complex Site anesthetized, incision made over site, wound drained and explored loculations, rinsed with copious amounts of normal saline, wound packed with sterile gauze, covered with dry, sterile dressing.  Pt tolerated procedure well without complications.  Instructions for care discussed verbally and pt provided with additional written instructions for homecare and f/u.  EMERGENCY DEPARTMENT US SOFT TISSUE INTERPRETATION "Study: Limited Ultrasound of the noted body part in comments below"  INDICATIONS: Pain Multiple views of the body part are obtained with a multi-frequency linear probe  PERFORMED BY:  Myself  IMAGES ARCHIVED?: Yes  SIDE:Right   BODY PART:Upper extremity  FINDINGS: Abcess present  LIMITATIONS:  Emergent Procedure  INTERPRETATION:  Abcess present  COMMENT:   Appeared to be complex fluid under skin  Labs Review Labs Reviewed - No data to display  Imaging Review No results found.    EKG Interpretation None      MDM   Final diagnoses:  Right forearm cellulitis   Patient with skin abscess to the right proximal forearm thought to be amenable to incision and drainage. US of the area shows pockets of fluids that will allow for I&D. There was minimal pus drainage from the area, which may indicate callus of the area due to repeated injections from prior IV drug use now with overlying cellulitis. Use of warm soaks and clean wound covers at least 2x a day encouraged. Pt tolerated procedure well and was given return precautions. Will d/c to home with updated tdap booster and anti-biotic therapy (keflex).  PCP follow up.  Discussed result, findings, treatment, and follow up  with patient.  Pt given return precautions.  Pt verbalizes understanding and agrees with plan.       I personally performed the services described in this documentation, which was scribed in my presence. The recorded information has been reviewed and is accurate.  Clayton Bibles, PA-C 06/26/15 [place], MD 06/26/15 [phone]

## 2015-06-26 NOTE — Telephone Encounter (Signed)
Pt was recently seen on 05/28/15, and is requesting a referral to orthopedic for back and neck pain. Please follow up with patient.

## 2015-06-27 NOTE — Telephone Encounter (Signed)
Good Morning  I don't see a referral for orthopedics and patient don't have insurance  Thanks

## 2015-07-01 ENCOUNTER — Telehealth: Payer: Self-pay

## 2015-07-01 NOTE — Telephone Encounter (Signed)
Lvm with relative  About [facility]

## 2015-07-01 NOTE — Telephone Encounter (Signed)
Returned patient phone call Patient not available at this time Unable to leave message Message states to try your call again later

## 2015-07-01 NOTE — Telephone Encounter (Signed)
Call patient and let him know that he needs to sign up for [facility] discount before a referral can be given. Otherwise he will be required to pay out of pocket before being seen

## 2015-07-09 ENCOUNTER — Other Ambulatory Visit: Payer: Self-pay | Admitting: Internal Medicine

## 2015-07-09 DIAGNOSIS — N529 Male erectile dysfunction, unspecified: Secondary | ICD-10-CM

## 2015-07-09 MED ORDER — SILDENAFIL CITRATE 100 MG PO TABS: 50.0000 mg | ORAL_TABLET | Freq: Every day | ORAL | Status: DC | PRN

## 2015-07-17 ENCOUNTER — Ambulatory Visit: Payer: Self-pay | Attending: Internal Medicine | Admitting: Internal Medicine

## 2015-07-17 ENCOUNTER — Encounter: Payer: Self-pay | Admitting: Internal Medicine

## 2015-07-17 VITALS — BP 138/99 | HR 89 | Temp 97.5°F | Resp 17 | Ht 73.0 in | Wt 155.0 lb

## 2015-07-17 DIAGNOSIS — M5416 Radiculopathy, lumbar region: Secondary | ICD-10-CM | POA: Insufficient documentation

## 2015-07-17 DIAGNOSIS — I10 Essential (primary) hypertension: Secondary | ICD-10-CM | POA: Insufficient documentation

## 2015-07-17 DIAGNOSIS — Z79899 Other long term (current) drug therapy: Secondary | ICD-10-CM | POA: Insufficient documentation

## 2015-07-17 DIAGNOSIS — F17210 Nicotine dependence, cigarettes, uncomplicated: Secondary | ICD-10-CM | POA: Insufficient documentation

## 2015-07-17 MED ORDER — TRAMADOL HCL 50 MG PO TABS: 50.0000 mg | ORAL_TABLET | Freq: Four times a day (QID) | ORAL | Status: DC | PRN

## 2015-07-17 NOTE — Progress Notes (Signed)
Patient here for a referral to neurology

## 2015-07-17 NOTE — Progress Notes (Signed)
Patient ID: Calvin [name], male   DOB: May 27, 1961, 54 y.o.   MRN: [id number]  CC: Neurosurgery referral   HPI: Calvin [name] is a 54 y.o. male here today for a follow up visit.  Patient has past medical history of HTN, lumbar herniated disc, or arthritis. Patient reports that he was seen by Sports Medicine and was told that he would require surgery on his herniated disc that is pressing against his sciatic nerve. Patient is currently uninsured and is wondering what can be done to assist him. Patient reports that he is in process of applying for disability. He does have shooting pain in bilateral legs. No bowel or bladder dysfunction.            No Known Allergies Past Medical History  Diagnosis Date  . Hypertension   . Lumbar herniated disc   . Arthritis   . Disc disorder    Current Outpatient Prescriptions on File Prior to Visit  Medication Sig Dispense Refill  . amLODipine (NORVASC) 5 MG tablet Take 1 tablet (5 mg total) by mouth daily. 90 tablet 3  . cyclobenzaprine (FLEXERIL) 10 MG tablet Take 1 tablet (10 mg total) by mouth 2 (two) times daily as needed for muscle spasms. 60 tablet 3  . ibuprofen (ADVIL,MOTRIN) 800 MG tablet   0  . traMADol (ULTRAM) 50 MG tablet Take 1 tablet (50 mg total) by mouth every 6 (six) hours as needed. 60 tablet 1  . cephALEXin (KEFLEX) 500 MG capsule Take 1 capsule (500 mg total) by mouth 4 (four) times daily. (Patient not taking: Reported on 07/17/2015) 28 capsule 0  . gabapentin (NEURONTIN) 300 MG capsule Take 1 capsule (300 mg total) by mouth at bedtime. (Patient not taking: Reported on 05/28/2015) 30 capsule 2  . sildenafil (VIAGRA) 100 MG tablet Take 0.5-1 tablets (50-100 mg total) by mouth daily as needed for erectile dysfunction. 90 tablet 3   No current facility-administered medications on file prior to visit.   Family History  Problem Relation Age of Onset  . Diabetes Mother   . Hypertension Mother   . Heart disease Mother   . Hyperlipidemia Mother   .  Diabetes Brother   . Diabetes Daughter   . Hypertension Sister   . Diabetes Sister    Social History   Social History  . Marital Status: Single    Spouse Name: N/A  . Number of Children: N/A  . Years of Education: N/A   Occupational History  . Not on file.   Social History Main Topics  . Smoking status: Current Every Day Smoker -- 0.50 packs/day for 20 years    Types: Cigarettes  . Smokeless tobacco: Not on file  . Alcohol Use: Yes     Comment: occasional  . Drug Use: No  . Sexual Activity: Not on file   Other Topics Concern  . Not on file   Social History Narrative    Review of Systems: Other than what is stated in HPI, all other systems are negative.   Objective:   Filed Vitals:   07/17/15 1403  BP: 138/99  Pulse: 89  Temp: 97.5 F (36.4 C)  Resp: 17    Physical Exam  Constitutional: He is oriented to person, place, and time.  Cardiovascular: Normal rate, regular rhythm and normal heart sounds.   Pulmonary/Chest: Effort normal and breath sounds normal.  Musculoskeletal: Normal range of motion. He exhibits no tenderness.  Neurological: He is alert and oriented to person, place, and time.  Skin: Skin is warm and dry.  Psychiatric: He has a normal mood and affect.     Lab Results  Component Value Date   WBC 13.1* 05/03/2014   HGB 12.8* 05/03/2014   HCT 37.7* 05/03/2014   MCV 88.1 05/03/2014   PLT 257 05/03/2014   Lab Results  Component Value Date   CREATININE 1.03 10/15/2014   BUN 10 10/15/2014   NA 138 10/15/2014   K 5.3 10/15/2014   CL 106 10/15/2014   CO2 30 10/15/2014    No results found for: HGBA1C Lipid Panel     Component Value Date/Time   CHOL 180 08/10/2013 1212   TRIG 59 08/10/2013 1212   HDL 56 08/10/2013 1212   CHOLHDL 3.2 08/10/2013 1212   VLDL 12 08/10/2013 1212   LDLCALC 112* 08/10/2013 1212       Assessment and plan:   [name] was seen today for referral.  Diagnoses and all orders for this visit:  Lumbar  radiculopathy -     traMADol (ULTRAM) 50 MG tablet; Take 1 tablet (50 mg total) by mouth every 6 (six) hours as needed. Patient is uninsured and will need insurance before being seen by neurosurgery. He will call me beginning of next year and let me if he was approved for medicaid  Return in about 3 months (around 10/15/2015) for Hypertension.        Lance Bosch, [facility] [phone] 07/17/2015, 2:31 PM

## 2015-08-13 MED FILL — traMADol HCL 50 MG TABS: 50 | 15 days supply | Qty: 60 | Fill #1

## 2015-08-25 ENCOUNTER — Other Ambulatory Visit: Payer: Self-pay | Admitting: Internal Medicine

## 2015-08-25 ENCOUNTER — Telehealth: Payer: Self-pay

## 2015-08-25 DIAGNOSIS — N529 Male erectile dysfunction, unspecified: Secondary | ICD-10-CM

## 2015-08-25 MED ORDER — SILDENAFIL CITRATE 100 MG PO TABS: 50.0000 mg | ORAL_TABLET | Freq: Every day | ORAL | Status: DC | PRN

## 2015-08-25 NOTE — Telephone Encounter (Signed)
Patient called requesting a refill prescription for pass for viagra RX printed and given to pharmacy

## 2015-09-05 ENCOUNTER — Telehealth: Payer: Self-pay | Admitting: Internal Medicine

## 2015-09-05 NOTE — Telephone Encounter (Signed)
Patient called requesting a medication refill for Tramadol. Please follow up.

## 2015-09-08 ENCOUNTER — Telehealth: Payer: Self-pay

## 2015-09-08 DIAGNOSIS — M5416 Radiculopathy, lumbar region: Secondary | ICD-10-CM

## 2015-09-08 MED ORDER — TRAMADOL HCL 50 MG PO TABS: 50.0000 mg | ORAL_TABLET | Freq: Two times a day (BID) | ORAL | Status: DC | PRN

## 2015-09-08 NOTE — Telephone Encounter (Signed)
Tried to contact patient about his tramadol refill Patient not available Message left on voice mail to return our call

## 2015-09-12 MED FILL — traMADol HCL 50 MG TABS: 50 | 30 days supply | Qty: 60 | Fill #0

## 2015-09-29 MED FILL — $VIAGRA 100 MG TABLET: 100 | 30 days supply | Qty: 10 | Fill #0

## 2015-10-06 ENCOUNTER — Encounter (HOSPITAL_COMMUNITY): Payer: Self-pay | Admitting: Vascular Surgery

## 2015-10-06 DIAGNOSIS — I10 Essential (primary) hypertension: Secondary | ICD-10-CM | POA: Insufficient documentation

## 2015-10-06 DIAGNOSIS — F17210 Nicotine dependence, cigarettes, uncomplicated: Secondary | ICD-10-CM | POA: Insufficient documentation

## 2015-10-06 DIAGNOSIS — R51 Headache: Secondary | ICD-10-CM | POA: Insufficient documentation

## 2015-10-06 NOTE — ED Notes (Signed)
Pt reports to the ED for eval of HA x 3 days. Pain is not worse with light and sound. Denies any N/V. HAs been taking BC powder without relief. He is rx Tramadol but is currently out. Pt A&Ox4, resp e/u, and skin warm and dry.

## 2015-10-07 ENCOUNTER — Emergency Department (HOSPITAL_COMMUNITY)
Admission: EM | Admit: 2015-10-07 | Discharge: 2015-10-07 | Disposition: A | Discharge status: Home / Self Care | Payer: Self-pay | Attending: Emergency Medicine | Admitting: Emergency Medicine

## 2015-10-07 ENCOUNTER — Emergency Department (HOSPITAL_COMMUNITY): Payer: Self-pay

## 2015-10-07 ENCOUNTER — Encounter (HOSPITAL_COMMUNITY): Payer: Self-pay

## 2015-10-07 DIAGNOSIS — M1990 Unspecified osteoarthritis, unspecified site: Secondary | ICD-10-CM | POA: Insufficient documentation

## 2015-10-07 DIAGNOSIS — Z79899 Other long term (current) drug therapy: Secondary | ICD-10-CM | POA: Insufficient documentation

## 2015-10-07 DIAGNOSIS — I10 Essential (primary) hypertension: Secondary | ICD-10-CM | POA: Insufficient documentation

## 2015-10-07 DIAGNOSIS — F17210 Nicotine dependence, cigarettes, uncomplicated: Secondary | ICD-10-CM | POA: Insufficient documentation

## 2015-10-07 DIAGNOSIS — J0100 Acute maxillary sinusitis, unspecified: Secondary | ICD-10-CM | POA: Insufficient documentation

## 2015-10-07 DIAGNOSIS — R51 Headache: Secondary | ICD-10-CM

## 2015-10-07 DIAGNOSIS — R519 Headache, unspecified: Secondary | ICD-10-CM

## 2015-10-07 LAB — CBC WITH DIFFERENTIAL/PLATELET
BASOS PCT: 1 %
Basophils Absolute: 0.1 10*3/uL (ref 0.0–0.1)
EOS PCT: 1 %
Eosinophils Absolute: 0.1 10*3/uL (ref 0.0–0.7)
HEMATOCRIT: 40.2 % (ref 39.0–52.0)
Hemoglobin: 14.4 g/dL (ref 13.0–17.0)
Lymphocytes Relative: 44 %
Lymphs Abs: 3 10*3/uL (ref 0.7–4.0)
MCH: 28.7 pg (ref 26.0–34.0)
MCHC: 35.8 g/dL (ref 30.0–36.0)
MCV: 80.1 fL (ref 78.0–100.0)
MONO ABS: 0.6 10*3/uL (ref 0.1–1.0)
Monocytes Relative: 8 %
Neutro Abs: 3 10*3/uL (ref 1.7–7.7)
Neutrophils Relative %: 46 %
Platelets: 236 10*3/uL (ref 150–400)
RBC: 5.02 MIL/uL (ref 4.22–5.81)
RDW: 14.7 % (ref 11.5–15.5)
WBC: 6.7 10*3/uL (ref 4.0–10.5)

## 2015-10-07 LAB — BASIC METABOLIC PANEL
ANION GAP: 14 (ref 5–15)
BUN: 13 mg/dL (ref 6–20)
CALCIUM: 8.9 mg/dL (ref 8.9–10.3)
CO2: 16 mmol/L — ABNORMAL LOW (ref 22–32)
Chloride: 112 mmol/L — ABNORMAL HIGH (ref 101–111)
Creatinine, Ser: 1.39 mg/dL — ABNORMAL HIGH (ref 0.61–1.24)
GFR calc Af Amer: >60 mL/min (ref >60)
GFR, EST NON AFRICAN AMERICAN: 56 mL/min — AB (ref >60)
GLUCOSE: 94 mg/dL (ref 65–99)
Potassium: 4.1 mmol/L (ref 3.5–5.1)
SODIUM: 142 mmol/L (ref 135–145)

## 2015-10-07 LAB — SEDIMENTATION RATE: Sed Rate: 10 mm/hr (ref 0–16)

## 2015-10-07 MED ORDER — AMOXICILLIN-POT CLAVULANATE 875-125 MG PO TABS
1.0000 | ORAL_TABLET | Freq: Once | ORAL | Status: AC
  Administered 2015-10-07: 1 via ORAL
  Filled 2015-10-07: qty 1

## 2015-10-07 MED ORDER — PREDNISONE 50 MG PO TABS: ORAL_TABLET | ORAL | Status: DC

## 2015-10-07 MED ORDER — METOCLOPRAMIDE HCL 5 MG/ML IJ SOLN
10.0000 mg | Freq: Once | INTRAMUSCULAR | Status: AC
  Administered 2015-10-07: 10 mg via INTRAVENOUS
  Filled 2015-10-07: qty 2

## 2015-10-07 MED ORDER — SODIUM CHLORIDE 0.9 % IV BOLUS (SEPSIS)
1000.0000 mL | Freq: Once | INTRAVENOUS | Status: AC
  Administered 2015-10-07: 1000 mL via INTRAVENOUS

## 2015-10-07 MED ORDER — AMOXICILLIN-POT CLAVULANATE 875-125 MG PO TABS: 1.0000 | ORAL_TABLET | Freq: Two times a day (BID) | ORAL | Status: DC

## 2015-10-07 MED ORDER — DIPHENHYDRAMINE HCL 50 MG/ML IJ SOLN
25.0000 mg | Freq: Once | INTRAMUSCULAR | Status: AC
  Administered 2015-10-07: 25 mg via INTRAVENOUS
  Filled 2015-10-07: qty 1

## 2015-10-07 MED ORDER — KETOROLAC TROMETHAMINE 30 MG/ML IJ SOLN
30.0000 mg | Freq: Once | INTRAMUSCULAR | Status: AC
  Administered 2015-10-07: 30 mg via INTRAVENOUS
  Filled 2015-10-07: qty 1

## 2015-10-07 MED ORDER — OXYCODONE-ACETAMINOPHEN 5-325 MG PO TABS: 1.0000 | ORAL_TABLET | ORAL | Status: DC | PRN

## 2015-10-07 MED ORDER — MORPHINE SULFATE (PF) 4 MG/ML IV SOLN
4.0000 mg | Freq: Once | INTRAVENOUS | Status: AC
  Administered 2015-10-07: 4 mg via INTRAVENOUS
  Filled 2015-10-07: qty 1

## 2015-10-07 NOTE — ED Notes (Signed)
Patient transported to CT 

## 2015-10-07 NOTE — ED Notes (Signed)
Patient d/c'd with family member.  F/U and medications discussed.  Patient verbalized understanding.

## 2015-10-07 NOTE — Discharge Instructions (Signed)
CT scan showed no acute findings. Prescription for pain medicine, prednisone, antibiotic for sinuses.

## 2015-10-07 NOTE — ED Notes (Signed)
Patient reports that he began having intermittent headaches x 3 days and low back pain. Patient states he has been taking BC powders and has been helping the headache pain. Patient states he had blurred vision and dizziness yesterday. Patient also states he has a lot of tension of the posterior neck. Patient also c/o low back pain, but states the headache is the main reason he is here.

## 2015-10-07 NOTE — ED Provider Notes (Signed)
CSN: [id number]     Arrival date & time 10/07/15  1320 History   First MD Initiated Contact with Patient 10/07/15 1739     Chief Complaint  Patient presents with  . Back Pain  . Headache     (Consider location/radiation/quality/duration/timing/severity/associated sxs/prior Treatment) HPI...Marland KitchenMarland KitchenRight temporal headache since Saturday described as a splitting headache. History and BC powders with minimal relief. Pain radiates the right lateral neck. He has had trouble eating and sleeping. No fever, sweats, chills, meningeal signs, neurological deficits. Review systems positive for sinus congestion right greater than left in his maxillary sinus area.  Past Medical History  Diagnosis Date  . Hypertension   . Lumbar herniated disc   . Arthritis   . Disc disorder    Past Surgical History  Procedure Laterality Date  . Foot surgery      right, ~ 2002   Family History  Problem Relation Age of Onset  . Diabetes Mother   . Hypertension Mother   . Heart disease Mother   . Hyperlipidemia Mother   . Diabetes Brother   . Diabetes Daughter   . Hypertension Sister   . Diabetes Sister    Social History  Substance Use Topics  . Smoking status: Current Every Day Smoker -- 0.10 packs/day for 20 years    Types: Cigarettes  . Smokeless tobacco: None  . Alcohol Use: Yes     Comment: rarely    Review of Systems  All other systems reviewed and are negative.     Allergies  Review of patient's allergies indicates no known allergies.  Home Medications   Prior to Admission medications   Medication Sig Start Date End Date Taking? Authorizing Provider  acetaminophen (TYLENOL) 500 MG tablet Take 1,000 mg by mouth every 6 (six) hours as needed for headache.   Yes Historical Provider, MD  amLODipine (NORVASC) 5 MG tablet Take 1 tablet (5 mg total) by mouth daily. 05/28/15  Yes Lance Bosch, NP  Aspirin-Salicylamide-Caffeine (BC FAST PAIN RELIEF) 650-195-33.3 MG PACK Take 1 Package by mouth  every 6 (six) hours as needed (headaches).   Yes Historical Provider, MD  cyclobenzaprine (FLEXERIL) 10 MG tablet Take 1 tablet (10 mg total) by mouth 2 (two) times daily as needed for muscle spasms. 05/28/15  Yes Lance Bosch, NP  gabapentin (NEURONTIN) 300 MG capsule Take 1 capsule (300 mg total) by mouth at bedtime. 08/06/14  Yes John C Pick-Jacobs, DO  sildenafil (VIAGRA) 100 MG tablet Take 0.5-1 tablets (50-100 mg total) by mouth daily as needed for erectile dysfunction. 08/25/15  Yes Lance Bosch, NP  traMADol (ULTRAM) 50 MG tablet Take 1 tablet (50 mg total) by mouth 2 (two) times daily as needed. 09/08/15  Yes Lance Bosch, NP  amoxicillin-clavulanate (AUGMENTIN) 875-125 MG tablet Take 1 tablet by mouth 2 (two) times daily. 10/07/15   Nat Christen, MD  cephALEXin (KEFLEX) 500 MG capsule Take 1 capsule (500 mg total) by mouth 4 (four) times daily. Patient not taking: Reported on 07/17/2015 06/26/15   Clayton Bibles, PA-C  oxyCODONE-acetaminophen (PERCOCET) 5-325 MG tablet Take 1-2 tablets by mouth every 4 (four) hours as needed. 10/07/15   Nat Christen, MD  predniSONE (DELTASONE) 50 MG tablet 1 tablet for 7 days 10/07/15   Nat Christen, MD   BP 117/89 mmHg  Pulse 77  Temp(Src) 98.7 F (37.1 C) (Oral)  Resp 16  SpO2 100% Physical Exam  Constitutional: He is oriented to person, place, and time. He appears well-developed  and well-nourished.  Alert, no gross neurological deficits.  HENT:  Head: Normocephalic and atraumatic.  Tender over right temporal artery  Eyes: Conjunctivae and EOM are normal. Pupils are equal, round, and reactive to light.  Neck: Normal range of motion. Neck supple.  Cardiovascular: Normal rate and regular rhythm.   Pulmonary/Chest: Effort normal and breath sounds normal.  Abdominal: Soft. Bowel sounds are normal.  Musculoskeletal: Normal range of motion.  Neurological: He is alert and oriented to person, place, and time.  Skin: Skin is warm and dry.  Psychiatric: He has a  normal mood and affect. His behavior is normal.  Nursing note and vitals reviewed.   ED Course  Procedures (including critical care time) Labs Review Labs Reviewed  BASIC METABOLIC PANEL - Abnormal; Notable for the following:    Chloride 112 (*)    CO2 16 (*)    Creatinine, Ser 1.39 (*)    GFR calc non Af Amer 56 (*)    All other components within normal limits  CBC WITH DIFFERENTIAL/PLATELET  SEDIMENTATION RATE    Imaging Review Ct Head Wo Contrast  10/07/2015  CLINICAL DATA:  Right-sided temporal headache. EXAM: CT HEAD WITHOUT CONTRAST TECHNIQUE: Contiguous axial images were obtained from the base of the skull through the vertex without intravenous contrast. COMPARISON:  None. FINDINGS: Sinuses/Soft tissues: Remote right medial orbital wall trauma, including on image 5/series 3. Otherwise normal paranasal sinuses and mastoid air cells. Intracranial: No mass lesion, hemorrhage, hydrocephalus, acute infarct, intra-axial, or extra-axial fluid collection. IMPRESSION: 1.  No acute intracranial abnormality. 2. Remote right medial orbital wall trauma/fracture. Electronically Signed   By: Abigail Miyamoto M.D.   On: 10/07/2015 18:41   I have personally reviewed and evaluated these images and lab results as part of my medical decision-making.   EKG Interpretation None      MDM   Final diagnoses:  Nonintractable headache, unspecified chronicity pattern, unspecified headache type  Acute maxillary sinusitis, recurrence not specified   Diagnosis of temporal arteritis entertained. However sedimentation rate was normal. CT head negative. Patient feels better after IV fluids, IV Toradol, IV Benadryl, IV Reglan.  No frank neurological deficits. Discharge medication Augmentin 875/125, prednisone, Percocet.    Nat Christen, MD 10/07/15 2154

## 2015-10-08 ENCOUNTER — Ambulatory Visit: Payer: Self-pay | Admitting: Family Medicine

## 2015-10-08 MED FILL — OXYCODONE/APAP 5-325: 5-325 | 2 days supply | Qty: 20 | Fill #0

## 2015-10-08 MED FILL — predniSONE 50 MG TABS: 50 | 7 days supply | Qty: 7 | Fill #0

## 2015-10-09 MED FILL — AMOX-CLAV 875-125 MG TABLET: 875-125 | 10 days supply | Qty: 20 | Fill #0

## 2015-10-16 ENCOUNTER — Other Ambulatory Visit: Payer: Self-pay | Admitting: Internal Medicine

## 2015-10-16 ENCOUNTER — Ambulatory Visit: Payer: Self-pay | Admitting: Internal Medicine

## 2015-10-16 DIAGNOSIS — M5416 Radiculopathy, lumbar region: Secondary | ICD-10-CM

## 2015-10-16 MED ORDER — TRAMADOL HCL 50 MG PO TABS: 50.0000 mg | ORAL_TABLET | Freq: Two times a day (BID) | ORAL | Status: DC | PRN

## 2015-10-16 MED FILL — traMADol HCL 50 MG TABS: 50 | 30 days supply | Qty: 60 | Fill #0

## 2015-10-27 MED FILL — $VIAGRA 100 MG TABLET: 100 | 30 days supply | Qty: 10 | Fill #1

## 2015-11-20 ENCOUNTER — Telehealth: Payer: Self-pay | Admitting: Internal Medicine

## 2015-11-20 NOTE — Telephone Encounter (Signed)
Pt requesting Rx Tramadol  Pt use [facility] .

## 2015-11-21 ENCOUNTER — Other Ambulatory Visit: Payer: Self-pay | Admitting: *Deleted

## 2015-11-21 DIAGNOSIS — M5416 Radiculopathy, lumbar region: Secondary | ICD-10-CM

## 2015-11-21 MED FILL — $VIAGRA 100 MG TABLET: 100 | 30 days supply | Qty: 10 | Fill #2

## 2015-11-21 NOTE — Telephone Encounter (Signed)
Request has been routed to provider for approval or denial

## 2015-11-21 NOTE — Telephone Encounter (Signed)
Patient was last seen in 07/17/2015 by [facility] Patient is requesting a refill on Tramadol.

## 2015-11-21 NOTE — Telephone Encounter (Signed)
Patient requested a refill for Tramadol, please f/u

## 2015-11-24 MED ORDER — TRAMADOL HCL 50 MG PO TABS: 50.0000 mg | ORAL_TABLET | Freq: Two times a day (BID) | ORAL | Status: DC | PRN

## 2015-11-24 MED FILL — traMADol HCL 50 MG TABS: 50 | 30 days supply | Qty: 60 | Fill #0

## 2015-11-24 NOTE — Telephone Encounter (Signed)
Patient verified DOB Patient is aware of prescription for Tramadol being refilled and placed at the front desk for pick up. Patient expressed his understanding and had no further questions at this time.

## 2015-12-01 ENCOUNTER — Ambulatory Visit: Payer: Self-pay | Attending: Internal Medicine | Admitting: Internal Medicine

## 2015-12-01 ENCOUNTER — Encounter: Payer: Self-pay | Admitting: Internal Medicine

## 2015-12-01 VITALS — BP 150/100 | HR 83 | Temp 98.4°F | Resp 18 | Ht 73.0 in | Wt 163.8 lb

## 2015-12-01 DIAGNOSIS — M5416 Radiculopathy, lumbar region: Secondary | ICD-10-CM | POA: Insufficient documentation

## 2015-12-01 DIAGNOSIS — Z8349 Family history of other endocrine, nutritional and metabolic diseases: Secondary | ICD-10-CM | POA: Insufficient documentation

## 2015-12-01 DIAGNOSIS — F329 Major depressive disorder, single episode, unspecified: Secondary | ICD-10-CM

## 2015-12-01 DIAGNOSIS — Z125 Encounter for screening for malignant neoplasm of prostate: Secondary | ICD-10-CM | POA: Insufficient documentation

## 2015-12-01 DIAGNOSIS — Z79899 Other long term (current) drug therapy: Secondary | ICD-10-CM | POA: Insufficient documentation

## 2015-12-01 DIAGNOSIS — Z833 Family history of diabetes mellitus: Secondary | ICD-10-CM | POA: Insufficient documentation

## 2015-12-01 DIAGNOSIS — M5126 Other intervertebral disc displacement, lumbar region: Secondary | ICD-10-CM | POA: Insufficient documentation

## 2015-12-01 DIAGNOSIS — M1990 Unspecified osteoarthritis, unspecified site: Secondary | ICD-10-CM | POA: Insufficient documentation

## 2015-12-01 DIAGNOSIS — Z7982 Long term (current) use of aspirin: Secondary | ICD-10-CM | POA: Insufficient documentation

## 2015-12-01 DIAGNOSIS — Z8249 Family history of ischemic heart disease and other diseases of the circulatory system: Secondary | ICD-10-CM | POA: Insufficient documentation

## 2015-12-01 DIAGNOSIS — Z8489 Family history of other specified conditions: Secondary | ICD-10-CM | POA: Insufficient documentation

## 2015-12-01 DIAGNOSIS — I10 Essential (primary) hypertension: Secondary | ICD-10-CM | POA: Insufficient documentation

## 2015-12-01 DIAGNOSIS — Z0000 Encounter for general adult medical examination without abnormal findings: Secondary | ICD-10-CM

## 2015-12-01 DIAGNOSIS — F17210 Nicotine dependence, cigarettes, uncomplicated: Secondary | ICD-10-CM | POA: Insufficient documentation

## 2015-12-01 DIAGNOSIS — Z7952 Long term (current) use of systemic steroids: Secondary | ICD-10-CM | POA: Insufficient documentation

## 2015-12-01 DIAGNOSIS — Z791 Long term (current) use of non-steroidal anti-inflammatories (NSAID): Secondary | ICD-10-CM | POA: Insufficient documentation

## 2015-12-01 DIAGNOSIS — Z79891 Long term (current) use of opiate analgesic: Secondary | ICD-10-CM | POA: Insufficient documentation

## 2015-12-01 DIAGNOSIS — F32A Depression, unspecified: Secondary | ICD-10-CM

## 2015-12-01 LAB — CMP AND LIVER
ALBUMIN: 3.8 g/dL (ref 3.6–5.1)
ALT: 24 U/L (ref 9–46)
AST: 26 U/L (ref 10–35)
Alkaline Phosphatase: 98 U/L (ref 40–115)
BILIRUBIN DIRECT: 0.1 mg/dL (ref <=0.2)
BILIRUBIN TOTAL: 0.5 mg/dL (ref 0.2–1.2)
BUN: 9 mg/dL (ref 7–25)
CALCIUM: 9 mg/dL (ref 8.6–10.3)
CO2: 24 mmol/L (ref 20–31)
CREATININE: 1.01 mg/dL (ref 0.70–1.33)
Chloride: 104 mmol/L (ref 98–110)
Glucose, Bld: 86 mg/dL (ref 65–99)
Indirect Bilirubin: 0.4 mg/dL (ref 0.2–1.2)
Potassium: 4.3 mmol/L (ref 3.5–5.3)
Sodium: 137 mmol/L (ref 135–146)
TOTAL PROTEIN: 6.8 g/dL (ref 6.1–8.1)

## 2015-12-01 LAB — TSH: TSH: 0.78 m[IU]/L (ref 0.40–4.50)

## 2015-12-01 LAB — LIPID PANEL
Cholesterol: 163 mg/dL (ref 125–200)
HDL: 35 mg/dL — ABNORMAL LOW (ref >=40)
LDL CALC: 110 mg/dL (ref <130)
Total CHOL/HDL Ratio: 4.7 Ratio (ref <=5.0)
Triglycerides: 88 mg/dL (ref <150)
VLDL: 18 mg/dL (ref <30)

## 2015-12-01 MED ORDER — ACETAMINOPHEN-CODEINE #3 300-30 MG PO TABS: 1.0000 | ORAL_TABLET | Freq: Four times a day (QID) | ORAL | Status: DC | PRN

## 2015-12-01 MED ORDER — AMLODIPINE BESYLATE 10 MG PO TABS: 10.0000 mg | ORAL_TABLET | Freq: Every day | ORAL | Status: DC

## 2015-12-01 MED FILL — ACETAMINOPHEN/COD #3 TABLET: 300-30 | 11 days supply | Qty: 45 | Fill #0

## 2015-12-01 MED FILL — ?AMLODIPINE BESYLATE 10 MG: 10 | 30 days supply | Qty: 30 | Fill #0

## 2015-12-01 NOTE — Progress Notes (Signed)
Patient is here to establish care for back pain  Patient complains of chronic lower back pain being scaled currently at an 8. Pain is described as sharp pains.  Patient has not taken any medication nor has the patient eaten today.  Patient denies any suicidal ideations at this time.

## 2015-12-01 NOTE — Progress Notes (Signed)
Calvin [name], is a 55 y.o. male  [id number]  [id number]  DOB - [birth year]  CC:  Chief Complaint  Patient presents with  . Establish Care    Pain       HPI: Calvin [name] is a 55 y.o. male w/ sign PMhx htn and herniated disc l4-5 w/ chronic lower back pain here today to establish medical care.   Per patient, pain has been getting worse this past year, and ultram is no longer helping w/ his pain.  C/o of bilateral low back pain w/ radiation down to his right leg. C/o of right leg numbness and intermittent weakness, will give out sometimes.  Denies urinary incontinence, but states sometimes has lost of stool function.  When he gets urge to defacate, needs to be very close to restroom or he will not make it.    Still smoking 1-2 cigs /day.  Denies etoh. Ran out of his meds last week or so., and hasn't taking his norvasc since than.  Patient has No headache, No chest pain, No abdominal pain - No Nausea, No new weakness tingling or numbness, No Cough - SOB.  No Known Allergies Past Medical History  Diagnosis Date  . Hypertension   . Lumbar herniated disc   . Arthritis   . Disc disorder    Current Outpatient Prescriptions on File Prior to Visit  Medication Sig Dispense Refill  . acetaminophen (TYLENOL) 500 MG tablet Take 1,000 mg by mouth every 6 (six) hours as needed for headache.    . Aspirin-Salicylamide-Caffeine (BC FAST PAIN RELIEF) 650-195-33.3 MG PACK Take 1 Package by mouth every 6 (six) hours as needed (headaches).    . cyclobenzaprine (FLEXERIL) 10 MG tablet Take 1 tablet (10 mg total) by mouth 2 (two) times daily as needed for muscle spasms. 60 tablet 3  . sildenafil (VIAGRA) 100 MG tablet Take 0.5-1 tablets (50-100 mg total) by mouth daily as needed for erectile dysfunction. 90 tablet 3  . traMADol (ULTRAM) 50 MG tablet Take 1 tablet (50 mg total) by mouth 2 (two) times daily as needed. 60 tablet 0  . amoxicillin-clavulanate (AUGMENTIN) 875-125 MG tablet Take 1 tablet by  mouth 2 (two) times daily. (Patient not taking: Reported on 12/01/2015) 20 tablet 0  . gabapentin (NEURONTIN) 300 MG capsule Take 1 capsule (300 mg total) by mouth at bedtime. (Patient not taking: Reported on 12/01/2015) 30 capsule 2  . oxyCODONE-acetaminophen (PERCOCET) 5-325 MG tablet Take 1-2 tablets by mouth every 4 (four) hours as needed. (Patient not taking: Reported on 12/01/2015) 20 tablet 0  . predniSONE (DELTASONE) 50 MG tablet 1 tablet for 7 days (Patient not taking: Reported on 12/01/2015) 7 tablet 0   No current facility-administered medications on file prior to visit.   Family History  Problem Relation Age of Onset  . Diabetes Mother   . Hypertension Mother   . Heart disease Mother   . Hyperlipidemia Mother   . Diabetes Brother   . Diabetes Daughter   . Hypertension Sister   . Diabetes Sister    Social History   Social History  . Marital Status: Single    Spouse Name: N/A  . Number of Children: N/A  . Years of Education: N/A   Occupational History  . Not on file.   Social History Main Topics  . Smoking status: Current Every Day Smoker -- 0.10 packs/day for 20 years    Types: Cigarettes  . Smokeless tobacco: Not on file  . Alcohol Use: Yes  Comment: rarely  . Drug Use: No  . Sexual Activity: Not on file   Other Topics Concern  . Not on file   Social History Narrative    Review of Systems: Constitutional: Negative for fever, chills, diaphoresis, activity change, appetite change and fatigue. HENT: Negative for ear pain, nosebleeds, congestion, facial swelling, rhinorrhea, neck pain, neck stiffness and ear discharge.  Eyes: Negative for pain, discharge, redness, itching and visual disturbance. Respiratory: Negative for cough, choking, chest tightness, shortness of breath, wheezing and stridor.  Cardiovascular: Negative for chest pain, palpitations and leg swelling. Gastrointestinal: Negative for abdominal distention.  If not close to RR when has urge to  defecate/have BM, sometimes has lost of stool.  Denies bloody stools, loose stools, diarrhea/brbpr Genitourinary: Negative for dysuria, urgency, frequency, hematuria, flank pain, decreased urine volume, difficulty urinating and dyspareunia.  Musculoskeletal: Negative for back pain, joint swelling, arthralgia and gait problem. Neurological: Negative for dizziness, tremors, seizures, syncope, facial asymmetry, speech difficulty, weakness, light-headedness, numbness and headaches.  Hematological: Negative for adenopathy. Does not bruise/bleed easily. Psychiatric/Behavioral: Negative for hallucinations, behavioral problems, confusion, dysphoric mood, decreased concentration and agitation.    Objective:   Filed Vitals:   12/01/15 1420  BP: 150/100  Pulse: 83  Temp: 98.4 F (36.9 C)  Resp: 18    Filed Weights   12/01/15 1420  Weight: 163 lb 12.8 oz (74.299 kg)    BP Readings from Last 3 Encounters:  12/01/15 150/100  10/07/15 121/87  10/07/15 131/96    Physical Exam: Constitutional: Patient appears well-developed and well-nourished. No distress. AAOx3, thin appearing, pleasant.  Uncomfortable when getting him onto examining table from back pain. HENT: Normocephalic, atraumatic, External right and left ear normal. Oropharynx is clear and moist.  Eyes: Conjunctivae and EOM are normal. PERRL, no scleral icterus. Neck: Normal ROM. Neck supple. No JVD.  CVS: RRR, S1/S2 +, no murmurs, no gallops, no carotid bruit.  Pulmonary: Effort and breath sounds normal, no stridor, rhonchi, wheezes, rales.  Abdominal: Soft. BS +, no distension, tenderness, rebound or guarding.  Musculoskeletal: Normal range of motion. No edema and no tenderness.  ttp bilateral lumbar, paraspinal back pain, no specific radiation produced, but Pt states his right leg is chronically numb night. GU:   chaperone present during this exam.  Normal rectal tone, prostate exam unremarkable, no palpable masses.  LE: bilat/ no  c/c/e, pulses 2+ bilateral. Lymphadenopathy: No lymphadenopathy noted, cervical Neuro: Alert. Normal reflexes, muscle tone coordination. No cranial nerve deficit grossly. Skin: Skin is warm and dry. No rash noted. Not diaphoretic. No erythema. No pallor. Psychiatric: Normal mood and affect. Behavior, judgment, thought content normal.  Lab Results  Component Value Date   WBC 6.7 10/07/2015   HGB 14.4 10/07/2015   HCT 40.2 10/07/2015   MCV 80.1 10/07/2015   PLT 236 10/07/2015   Lab Results  Component Value Date   CREATININE 1.39* 10/07/2015   BUN 13 10/07/2015   NA 142 10/07/2015   K 4.1 10/07/2015   CL 112* 10/07/2015   CO2 16* 10/07/2015    No results found for: HGBA1C Lipid Panel     Component Value Date/Time   CHOL 180 08/10/2013 1212   TRIG 59 08/10/2013 1212   HDL 56 08/10/2013 1212   CHOLHDL 3.2 08/10/2013 1212   VLDL 12 08/10/2013 1212   LDLCALC 112* 08/10/2013 1212       Depression screen PHQ 2/9 12/01/2015 12/01/2015 08/06/2014  Decreased Interest 3 0 0  Down, Depressed, Hopeless 2 0  0  PHQ - 2 Score 5 0 0  Altered sleeping 3 - -  Tired, decreased energy 3 - -  Change in appetite 3 - -  Feeling bad or failure about yourself  2 - -  Trouble concentrating 3 - -  Moving slowly or fidgety/restless 3 - -  Suicidal thoughts 2 - -  PHQ-9 Score 24 - -     guiac negative Assessment and plan:   1. Essential hypertension, benign,  - no other concerning feactures today including chestpain/ha - uncontrolled, ran out of meds last 1 wk or so,  - increased norvasc to 10mg  qd for now, close f/u, encouraged to pick meds - CMP and Liver - Lipid Panel  2. Lumbar radiculopathy, to right leg - prn flexeril, add tylenol #3 to ultram prn. - Ambulatory referral to Pain Clinic - AMB referral to orthopedics  3. Lumbar herniated disc (l4-5) See above - Ambulatory referral to Pain Clinic - AMB referral to orthopedics  4. Health care maintenance chk/ - TSH - VITAMIN D  25 Hydroxy (Vit-D Deficiency, Fractures) - Hemoglobin A1C  5. Screening for prostate cancer -prostate exam today unremarkable, dw pt risk/benefits of psa testing, amendable to testing today. - chk PSA  6. Depression screening positive, denies si/hi, but struggles due to his back pain  - amb ref beh health placed given high score.  7. Hx of aki on 3/17 - will rchk renal function today, encouraged to stay hydrated.  Return in about 2 months (around 01/31/2016), or if symptoms worsen or fail to improve.  The patient was given clear instructions to go to ER or return to medical center if symptoms don't improve, worsen or new problems develop. The patient verbalized understanding. The patient was told to call to get lab results if they haven't heard anything in the next week.      Maren Reamer, MD, [place], [place]   12/01/2015, 2:35 PM

## 2015-12-01 NOTE — Patient Instructions (Signed)
Back Exercises If you have pain in your back, do these exercises 2-3 times each day or as told by your doctor. When the pain goes away, do the exercises once each day, but repeat the steps more times for each exercise (do more repetitions). If you do not have pain in your back, do these exercises once each day or as told by your doctor. EXERCISES Single Knee to Chest Do these steps 3-5 times in a row for each leg:  Lie on your back on a firm bed or the floor with your legs stretched out.  Bring one knee to your chest.  Hold your knee to your chest by grabbing your knee or thigh.  Pull on your knee until you feel a gentle stretch in your lower back.  Keep doing the stretch for 10-30 seconds.  Slowly let go of your leg and straighten it. Pelvic Tilt Do these steps 5-10 times in a row:  Lie on your back on a firm bed or the floor with your legs stretched out.  Bend your knees so they point up to the ceiling. Your feet should be flat on the floor.  Tighten your lower belly (abdomen) muscles to press your lower back against the floor. This will make your tailbone point up to the ceiling instead of pointing down to your feet or the floor.  Stay in this position for 5-10 seconds while you gently tighten your muscles and breathe evenly. Cat-Cow Do these steps until your lower back bends more easily: 1. Get on your hands and knees on a firm surface. Keep your hands under your shoulders, and keep your knees under your hips. You may put padding under your knees. 2. Let your head hang down, and make your tailbone point down to the floor so your lower back is round like the back of a cat. 3. Stay in this position for 5 seconds. 4. Slowly lift your head and make your tailbone point up to the ceiling so your back hangs low (sags) like the back of a cow. 5. Stay in this position for 5 seconds. Press-Ups Do these steps 5-10 times in a row: 1. Lie on your belly (face-down) on the floor. 2. Place  your hands near your head, about shoulder-width apart. 3. While you keep your back relaxed and keep your hips on the floor, slowly straighten your arms to raise the top half of your body and lift your shoulders. Do not use your back muscles. To make yourself more comfortable, you may change where you place your hands. 4. Stay in this position for 5 seconds. 5. Slowly return to lying flat on the floor. Bridges Do these steps 10 times in a row: 1. Lie on your back on a firm surface. 2. Bend your knees so they point up to the ceiling. Your feet should be flat on the floor. 3. Tighten your butt muscles and lift your butt off of the floor until your waist is almost as high as your knees. If you do not feel the muscles working in your butt and the back of your thighs, slide your feet 1-2 inches farther away from your butt. 4. Stay in this position for 3-5 seconds. 5. Slowly lower your butt to the floor, and let your butt muscles relax. If this exercise is too easy, try doing it with your arms crossed over your chest. Belly Crunches Do these steps 5-10 times in a row: 1. Lie on your back on a firm bed  or the floor with your legs stretched out. 2. Bend your knees so they point up to the ceiling. Your feet should be flat on the floor. 3. Cross your arms over your chest. 4. Tip your chin a little bit toward your chest but do not bend your neck. 5. Tighten your belly muscles and slowly raise your chest just enough to lift your shoulder blades a tiny bit off of the floor. 6. Slowly lower your chest and your head to the floor. Back Lifts Do these steps 5-10 times in a row: 1. Lie on your belly (face-down) with your arms at your sides, and rest your forehead on the floor. 2. Tighten the muscles in your legs and your butt. 3. Slowly lift your chest off of the floor while you keep your hips on the floor. Keep the back of your head in line with the curve in your back. Look at the floor while you do  this. 4. Stay in this position for 3-5 seconds. 5. Slowly lower your chest and your face to the floor. GET HELP IF:  Your back pain gets a lot worse when you do an exercise.  Your back pain does not lessen 2 hours after you exercise. If you have any of these problems, stop doing the exercises. Do not do them again unless your doctor says it is okay. GET HELP RIGHT AWAY IF:  You have sudden, very bad back pain. If this happens, stop doing the exercises. Do not do them again unless your doctor says it is okay.   This information is not intended to replace advice given to you by your health care provider. Make sure you discuss any questions you have with your health care provider.   Document Released: 08/14/2010 Document Revised: 04/02/2015 Document Reviewed: 09/05/2014 Elsevier Interactive Patient Education 2016 Elsevier Inc. \  Low-Sodium Eating Plan Sodium raises blood pressure and causes water to be held in the body. Getting less sodium from food will help lower your blood pressure, reduce any swelling, and protect your heart, liver, and kidneys. We get sodium by adding salt (sodium chloride) to food. Most of our sodium comes from canned, boxed, and frozen foods. Restaurant foods, fast foods, and pizza are also very high in sodium. Even if you take medicine to lower your blood pressure or to reduce fluid in your body, getting less sodium from your food is important. WHAT IS MY PLAN? Most people should limit their sodium intake to 2,300 mg a day. Your health care provider recommends that you limit your sodium intake to 2grams / a day.  WHAT DO I NEED TO KNOW ABOUT THIS EATING PLAN? For the low-sodium eating plan, you will follow these general guidelines:  Choose foods with a % Daily Value for sodium of less than 5% (as listed on the food label).   Use salt-free seasonings or herbs instead of table salt or sea salt.   Check with your health care provider or pharmacist before using  salt substitutes.   Eat fresh foods.  Eat more vegetables and fruits.  Limit canned vegetables. If you do use them, rinse them well to decrease the sodium.   Limit cheese to 1 oz (28 g) per day.   Eat lower-sodium products, often labeled as "lower sodium" or "no salt added."  Avoid foods that contain monosodium glutamate (MSG). MSG is sometimes added to [place] food and some canned foods.  Check food labels (Nutrition Facts labels) on foods to learn how much sodium  is in one serving.  Eat more home-cooked food and less restaurant, buffet, and fast food.  When eating at a restaurant, ask that your food be prepared with less salt, or no salt if possible.  HOW DO I READ FOOD LABELS FOR SODIUM INFORMATION? The Nutrition Facts label lists the amount of sodium in one serving of the food. If you eat more than one serving, you must multiply the listed amount of sodium by the number of servings. Food labels may also identify foods as:  Sodium free--Less than 5 mg in a serving.  Very low sodium--35 mg or less in a serving.  Low sodium--140 mg or less in a serving.  Light in sodium--50% less sodium in a serving. For example, if a food that usually has 300 mg of sodium is changed to become light in sodium, it will have 150 mg of sodium.  Reduced sodium--25% less sodium in a serving. For example, if a food that usually has 400 mg of sodium is changed to reduced sodium, it will have 300 mg of sodium. WHAT FOODS CAN I EAT? Grains Low-sodium cereals, including oats, puffed wheat and rice, and shredded wheat cereals. Low-sodium crackers. Unsalted rice and pasta. Lower-sodium bread.  Vegetables Frozen or fresh vegetables. Low-sodium or reduced-sodium canned vegetables. Low-sodium or reduced-sodium tomato sauce and paste. Low-sodium or reduced-sodium tomato and vegetable juices.  Fruits Fresh, frozen, and canned fruit. Fruit juice.  Meat and Other Protein Products Low-sodium canned  tuna and salmon. Fresh or frozen meat, poultry, seafood, and fish. Lamb. Unsalted nuts. Dried beans, peas, and lentils without added salt. Unsalted canned beans. Homemade soups without salt. Eggs.  Dairy Milk. Soy milk. Ricotta cheese. Low-sodium or reduced-sodium cheeses. Yogurt.  Condiments Fresh and dried herbs and spices. Salt-free seasonings. Onion and garlic powders. Low-sodium varieties of mustard and ketchup. Fresh or refrigerated horseradish. Lemon juice.  Fats and Oils Reduced-sodium salad dressings. Unsalted butter.  Other Unsalted popcorn and pretzels.  The items listed above may not be a complete list of recommended foods or beverages. Contact your dietitian for more options. WHAT FOODS ARE NOT RECOMMENDED? Grains Instant hot cereals. Bread stuffing, pancake, and biscuit mixes. Croutons. Seasoned rice or pasta mixes. Noodle soup cups. Boxed or frozen macaroni and cheese. Self-rising flour. Regular salted crackers. Vegetables Regular canned vegetables. Regular canned tomato sauce and paste. Regular tomato and vegetable juices. Frozen vegetables in sauces. Salted [place] fries. Olives. Angie Fava. Relishes. Sauerkraut. Salsa. Meat and Other Protein Products Salted, canned, smoked, spiced, or pickled meats, seafood, or fish. Bacon, ham, sausage, hot dogs, corned beef, chipped beef, and packaged luncheon meats. Salt pork. Jerky. Pickled herring. Anchovies, regular canned tuna, and sardines. Salted nuts. Dairy Processed cheese and cheese spreads. Cheese curds. Blue cheese and cottage cheese. Buttermilk.  Condiments Onion and garlic salt, seasoned salt, table salt, and sea salt. Canned and packaged gravies. Worcestershire sauce. Tartar sauce. Barbecue sauce. Teriyaki sauce. Soy sauce, including reduced sodium. Steak sauce. Fish sauce. Oyster sauce. Cocktail sauce. Horseradish that you find on the shelf. Regular ketchup and mustard. Meat flavorings and tenderizers. Bouillon cubes.  Hot sauce. Tabasco sauce. Marinades. Taco seasonings. Relishes. Fats and Oils Regular salad dressings. Salted butter. Margarine. Ghee. Bacon fat.  Other Potato and tortilla chips. Corn chips and puffs. Salted popcorn and pretzels. Canned or dried soups. Pizza. Frozen entrees and pot pies.  The items listed above may not be a complete list of foods and beverages to avoid. Contact your dietitian for more information.   This  information is not intended to replace advice given to you by your health care provider. Make sure you discuss any questions you have with your health care provider.   Document Released: 01/01/2002 Document Revised: 08/02/2014 Document Reviewed: 05/16/2013 Elsevier Interactive Patient Education [employer].

## 2015-12-02 ENCOUNTER — Other Ambulatory Visit: Payer: Self-pay | Admitting: Internal Medicine

## 2015-12-02 LAB — VITAMIN D 25 HYDROXY (VIT D DEFICIENCY, FRACTURES): VIT D 25 HYDROXY: 10 ng/mL — AB (ref 30–100)

## 2015-12-02 LAB — HEMOGLOBIN A1C

## 2015-12-02 LAB — PSA: PSA: 0.39 ng/mL (ref <=4.00)

## 2015-12-02 MED ORDER — VITAMIN D (ERGOCALCIFEROL) 1.25 MG (50000 UNIT) PO CAPS: 50000.0000 [IU] | ORAL_CAPSULE | ORAL | Status: DC

## 2015-12-02 MED FILL — VIT D2 1.25 MG (50,000 UNIT: 1.25 MG | 84 days supply | Qty: 12 | Fill #0

## 2015-12-23 MED FILL — $VIAGRA 100 MG TABLET: 100 | 30 days supply | Qty: 10 | Fill #3

## 2015-12-30 ENCOUNTER — Telehealth: Payer: Self-pay | Admitting: *Deleted

## 2015-12-30 NOTE — Telephone Encounter (Signed)
-----   Message from Maren Reamer, MD sent at 12/02/2015  9:13 AM EDT ----- Please call w/ results: vit D very low, which could add to muscle/bone pain he has.  Vit D rx given. Prostate, thyroid , kdiney /liver labs all normal. Cholesterol looks great too.  Keep up good work! Encourage smoking cessation.

## 2015-12-30 NOTE — Telephone Encounter (Signed)
MA unable to leave a VM due to phone ringing continuously.  Communication letter has been sent out.

## 2016-01-06 ENCOUNTER — Telehealth: Payer: Self-pay | Admitting: Internal Medicine

## 2016-01-06 NOTE — Telephone Encounter (Signed)
Pt. Called requesting a refill on the following medication Tylenol # 3. Please f/u with pt.

## 2016-01-07 NOTE — Telephone Encounter (Signed)
Pt. Called stating he received a letter form the [facility] to give [name] a call back. Please f/u with pt.

## 2016-01-09 NOTE — Telephone Encounter (Signed)
Pt. Called requesting a refill on Tylenol # 3. Please f/u with pt.  °

## 2016-01-12 NOTE — Telephone Encounter (Signed)
[place] - He needs to come in for appt for f/u, set up pain contract if he will be continuing pain meds w/ our clinic.    Alinda Sierras - Referral for pain specialist/ortho?  Status?  thanks

## 2016-01-12 NOTE — Telephone Encounter (Signed)
Pain Clinic  5/16 Referral has been sent for clinical review .Please,allow 4-7 weeks for an answer . Orthopedic  5/8  Pt dont have insurance I mail  a letter to patient with application to apply for the [facility] discount to be refer to an Orthopedic specialist.

## 2016-01-12 NOTE — Telephone Encounter (Signed)
Pt. Called requesting a refill on Tylenol # 3. Please f/u °

## 2016-01-14 ENCOUNTER — Telehealth: Payer: Self-pay | Admitting: Internal Medicine

## 2016-01-14 NOTE — Telephone Encounter (Signed)
Pt. Called requesting a refill on Tylenol # 3. Please f/u with pt.  °

## 2016-01-17 NOTE — Telephone Encounter (Signed)
Pt needs to make appt in clinic for pain contract /eval of pain if he wants to continue getting pain meds in our clinic. thanks

## 2016-01-20 MED FILL — $VIAGRA 100 MG TABLET: 100 | 30 days supply | Qty: 10 | Fill #4

## 2016-01-20 NOTE — Telephone Encounter (Signed)
Patient has a referral to Pain clinic which is awaiting an upcoming appointment.

## 2016-02-02 ENCOUNTER — Ambulatory Visit: Payer: Self-pay | Attending: Internal Medicine | Admitting: Internal Medicine

## 2016-02-02 ENCOUNTER — Encounter: Payer: Self-pay | Admitting: Internal Medicine

## 2016-02-02 VITALS — BP 136/93 | HR 92 | Temp 98.3°F | Resp 16 | Wt 147.6 lb

## 2016-02-02 DIAGNOSIS — M5416 Radiculopathy, lumbar region: Secondary | ICD-10-CM

## 2016-02-02 DIAGNOSIS — M47816 Spondylosis without myelopathy or radiculopathy, lumbar region: Secondary | ICD-10-CM

## 2016-02-02 MED ORDER — CYCLOBENZAPRINE HCL 10 MG PO TABS: 10.0000 mg | ORAL_TABLET | Freq: Two times a day (BID) | ORAL | Status: DC | PRN

## 2016-02-02 MED ORDER — ACETAMINOPHEN-CODEINE #3 300-30 MG PO TABS: 1.0000 | ORAL_TABLET | Freq: Four times a day (QID) | ORAL | Status: DC | PRN

## 2016-02-02 MED FILL — ACETAMINOPHEN/COD #3 TABLET: 300-30 | 15 days supply | Qty: 60 | Fill #0

## 2016-02-02 MED FILL — ?CYCLOBENZAPRINE 10 MG TABL: 10 | 30 days supply | Qty: 60 | Fill #0

## 2016-02-02 NOTE — Patient Instructions (Signed)

## 2016-02-02 NOTE — Progress Notes (Signed)
Calvin [name], is a 55 y.o. male  [id number]  [id number]  DOB - 1961/02/03  Chief Complaint  Patient presents with  . Medication Refill        Subjective:   Calvin [name] is a 55 y.o. male here today for a follow up visit for acute on chronic lower back pain, right side >left side, w/ radiculopathy to right lower extremity.  He complains of decrease sensation to the RLE.  Denies current urinary or stool incontinence, but states few months ago had urge incontinence /improved.  Currently wking on disability, but states he has 100% discount w/ [place].  Patient has No headache, No chest pain, No abdominal pain - No Nausea, No new weakness tingling or numbness, No Cough - SOB.  No problems updated.  ALLERGIES: No Known Allergies  PAST MEDICAL HISTORY: Past Medical History  Diagnosis Date  . Hypertension   . Lumbar herniated disc   . Arthritis   . Disc disorder     MEDICATIONS AT HOME: Prior to Admission medications   Medication Sig Start Date End Date Taking? Authorizing Provider  acetaminophen (TYLENOL) 500 MG tablet Take 1,000 mg by mouth every 6 (six) hours as needed for headache.   Yes Historical Provider, MD  acetaminophen-codeine (TYLENOL #3) 300-30 MG tablet Take 1 tablet by mouth every 6 (six) hours as needed for moderate pain. 02/02/16  Yes Maren Reamer, MD  amLODipine (NORVASC) 10 MG tablet Take 1 tablet (10 mg total) by mouth daily. 12/01/15  Yes Maren Reamer, MD  Aspirin-Salicylamide-Caffeine (BC FAST PAIN RELIEF) [phone] MG PACK Take 1 Package by mouth every 6 (six) hours as needed (headaches).   Yes Historical Provider, MD  cyclobenzaprine (FLEXERIL) 10 MG tablet Take 1 tablet (10 mg total) by mouth 2 (two) times daily as needed for muscle spasms. 02/02/16  Yes Maren Reamer, MD  sildenafil (VIAGRA) 100 MG tablet Take 0.5-1 tablets (50-100 mg total) by mouth daily as needed for erectile dysfunction. 08/25/15  Yes Lance Bosch, NP  Vitamin D,  Ergocalciferol, (DRISDOL) 50000 units CAPS capsule Take 1 capsule (50,000 Units total) by mouth every 7 (seven) days. 12/02/15  Yes Maren Reamer, MD  gabapentin (NEURONTIN) 300 MG capsule Take 1 capsule (300 mg total) by mouth at bedtime. Patient not taking: Reported on 12/01/2015 08/06/14   Elyse Jarvis Pick-Jacobs, DO  predniSONE (DELTASONE) 50 MG tablet 1 tablet for 7 days Patient not taking: Reported on 12/01/2015 10/07/15   Nat Christen, MD     Objective:   Filed Vitals:   02/02/16 1407  BP: 136/93  Pulse: 92  Temp: 98.3 F (36.8 C)  TempSrc: Oral  Resp: 16  Weight: 147 lb 9.6 oz (66.951 kg)  SpO2: 100%    Exam General appearance : Awake, alert, not in any distress. Speech Clear. Not toxic looking, thin pleasant appearing, walking w/ cane. HEENT: Atraumatic and Normocephalic, pupils equally reactive to light. Neck: supple, no JVD. No cervical lymphadenopathy.  Chest:Good air entry bilaterally, no added sounds. CVS: S1 S2 regular, no murmurs/gallups or rubs. Abdomen: Bowel sounds active, Non tender  Extremities: B/L Lower Ext shows no edema, both legs are warm to touch Lower back; ttp right lower paraspinus region, Right  >left, unequivocal  Tripod sign, +bilat + straight leg testing. Neurology: Awake alert, and oriented X 3, CN II-XII grossly intact, Non focal Skin:No Rash  Data Review Lab Results  Component Value Date   HGBA1C CANCELED 12/01/2015    Depression  screen [facility] 2/9 02/02/2016 12/01/2015 12/01/2015 08/06/2014  Decreased Interest 3 3 0 0  Down, Depressed, Hopeless 2 2 0 0  PHQ - 2 Score 5 5 0 0  Altered sleeping 2 3 - -  Tired, decreased energy 3 3 - -  Change in appetite 3 3 - -  Feeling bad or failure about yourself  2 2 - -  Trouble concentrating 3 3 - -  Moving slowly or fidgety/restless 2 3 - -  Suicidal thoughts 2 2 - -  PHQ-9 Score 22 24 - -  Difficult doing work/chores Not difficult at all - - -    Mri 04/2014 MRI LUMBAR SPINE:  1. Limited study due to  lack of all sequences available for review. No evidence of cord compression. 2. No epidural fluid collection or abnormal enhancement to suggest active infection. 3. Central disc protrusion with superimposed facet hypertrophy at L4-5 with resultant mild to moderate canal stenosis, not significantly changed relative to recent MRI. 4. Smaller central disc protrusion at L5-S1 without significant stenosis. Results were called by telephone at the time of interpretation on 04/30/2014 at 12:49 am to Dr. Pryor Curia , who verbally acknowledged these results.   Assessment & Plan   1. Lumbar radiculopathy w/ hx of l4-5 disc protrusion and superimposesd facet hypertrophy - dw pt reasons for pain contract, high opiod mortality epidemic. - dw pt at length re: pain contract.  Cannot go to ER for pain rx or he will have broken the contract.  However, once sees Pain MD or ortho and if they provide him w/ stronger rx, than we will void this contract. - pt agreed to sign pain contract today. - renewed tylenol #3, 60tabs given - cyclobenzaprine (FLEXERIL) 10 MG tablet; Take 1 tablet (10 mg total) by mouth 2 (two) times daily as needed for muscle spasms.  Dispense: 60 tablet; Refill: 3 - sent note to referral specialist to chk on ortho and pain referrals, pt states he has 100% [facility] discount, working on disability.  2. Lumbar facet arthropathy See above.     Patient have been counseled extensively about nutrition and exercise  Return in about 3 months (around 05/04/2016).  The patient was given clear instructions to go to ER or return to medical center if symptoms don't improve, worsen or new problems develop. The patient verbalized understanding. The patient was told to call to get lab results if they haven't heard anything in the next week.   This note has been created with Surveyor, quantity. Any transcriptional errors are unintentional.   Maren Reamer, MD, [place] [facility] [place], [place]   02/02/2016, 2:19 PM

## 2016-02-09 MED FILL — ?AMLODIPINE BESYLATE 10 MG: 10 | 30 days supply | Qty: 30 | Fill #1

## 2016-02-16 ENCOUNTER — Ambulatory Visit: Payer: Self-pay | Admitting: Physical Medicine & Rehabilitation

## 2016-02-17 ENCOUNTER — Telehealth: Payer: Self-pay

## 2016-02-17 ENCOUNTER — Telehealth: Payer: Self-pay | Admitting: Internal Medicine

## 2016-02-17 ENCOUNTER — Other Ambulatory Visit: Payer: Self-pay | Admitting: Internal Medicine

## 2016-02-17 MED FILL — $VIAGRA 100 MG TABLET: 100 | 30 days supply | Qty: 10 | Fill #5

## 2016-02-17 NOTE — Telephone Encounter (Signed)
Patient called and is requesting a medication refill for tylenol 3. Please follow up.

## 2016-02-17 NOTE — Telephone Encounter (Signed)
Please call him that he is too early for his rx.  Due 03/04/16, he can call 8/8 for the rx.,

## 2016-02-17 NOTE — Telephone Encounter (Signed)
Contacted pt and informed him that per Dr. Janne Napoleon he is to early for his rx he is due for his rx on 03/04/16 but he can call on 03/02/16. Pt did not answer lvm with information and if he has any questions or concerns to give me a call at the office

## 2016-02-19 NOTE — Telephone Encounter (Signed)
Contacted pt to inform him that he is early for his rxpt did not answer and was unable to lvm

## 2016-02-19 NOTE — Telephone Encounter (Signed)
Pt calling following up on his medication refill on Tylenol #3

## 2016-02-20 NOTE — Telephone Encounter (Signed)
Tried contacting patient pt did not answer left a voicemail and will be sending a letter out to  patient

## 2016-03-05 ENCOUNTER — Encounter: Payer: Self-pay | Attending: Physical Medicine & Rehabilitation

## 2016-03-05 ENCOUNTER — Ambulatory Visit: Payer: Self-pay | Admitting: Physical Medicine & Rehabilitation

## 2016-03-05 ENCOUNTER — Encounter: Payer: Self-pay | Admitting: Physical Medicine & Rehabilitation

## 2016-03-05 VITALS — BP 121/88 | HR 103

## 2016-03-05 DIAGNOSIS — M545 Low back pain: Principal | ICD-10-CM

## 2016-03-05 DIAGNOSIS — G8929 Other chronic pain: Secondary | ICD-10-CM

## 2016-03-05 NOTE — Patient Instructions (Signed)
You have already seen physical medicine and rehabilitation physician, Dr. Ernestina Patches who can do nonoperative spine care, such as is available at this clinic. You also have an appointment see the neurosurgeon. There is no reason. to establish care at this clinic. At the current time

## 2016-03-05 NOTE — Progress Notes (Signed)
55 year old male with a greater than two-year history of low back pain radiating into the lower extremities. He was referred to physical medicine and rehabilitation outpatient clinic.  He has, however, recently seen physical medicine rehabilitation physician, Dr. Ernestina Patches, who has referred the patient to a spine surgeon. He plans to get spine surgery evaluation in about a week. We discussed that the services offered at this clinic would not differ from the other physical medicine and rehabilitation physician. Did not want to duplicate services, Have not performed evaluation. Patient will follow up with neurosurgery as well as Dr. Ernestina Patches.  Urine sample discarded  Patient agrees with plan.  After patient left clinic, CMA indicated that a condom attached to [facility] tape was found in the garbage in the bathroom where he submitted a urine sample. This would suggest "clean urine" was concealed under clothing and submitted.  This was not seen in garbage after previous pt submitted urine sample.

## 2016-03-05 NOTE — Progress Notes (Signed)
Subjective:    Patient ID: Calvin [name], male    DOB: 01-16-1961, 55 y.o.   MRN: [id number]  HPI   Pain Inventory Average Pain 8 Pain Right Now 8 My pain is sharp and dull  In the last 24 hours, has pain interfered with the following? General activity 8 Relation with others 10 Enjoyment of life 10 What TIME of day is your pain at its worst? morning, evening  Sleep (in general) Poor  Pain is worse with: walking, bending, sitting, inactivity and standing Pain improves with: rest and medication Relief from Meds: 5  Mobility walk with assistance use a cane how many minutes can you walk? 5 ability to climb steps?  no do you drive?  yes Do you have any goals in this area?  no  Function not employed: date last employed Sept 2015 I need assistance with the following:  bathing, meal prep, household duties and shopping  Neuro/Psych bladder control problems bowel control problems weakness tingling trouble walking spasms anxiety  Prior Studies new visit  Physicians involved in your care new visit   Family History  Problem Relation Age of Onset  . Diabetes Mother   . Hypertension Mother   . Heart disease Mother   . Hyperlipidemia Mother   . Diabetes Brother   . Diabetes Daughter   . Hypertension Sister   . Diabetes Sister    Social History   Social History  . Marital status: Single    Spouse name: N/A  . Number of children: N/A  . Years of education: N/A   Social History Main Topics  . Smoking status: Current Every Day Smoker    Packs/day: 0.10    Years: 20.00    Types: Cigarettes  . Smokeless tobacco: Never Used  . Alcohol use Yes     Comment: rarely  . Drug use: No  . Sexual activity: Not Asked   Other Topics Concern  . None   Social History Narrative  . None   Past Surgical History:  Procedure Laterality Date  . FOOT SURGERY     right, ~ 2002   Past Medical History:  Diagnosis Date  . Arthritis   . Disc disorder   . Hypertension     . Lumbar herniated disc    BP 121/88 (BP Location: Right Arm, Patient Position: Sitting, Cuff Size: Large)   Pulse (!) 103   SpO2 98%   Opioid Risk Score:   Fall Risk Score:  `1  Depression screen PHQ 2/9  Depression screen [facility] 2/9 02/02/2016 12/01/2015 12/01/2015 08/06/2014  Decreased Interest 3 3 0 0  Down, Depressed, Hopeless 2 2 0 0  PHQ - 2 Score 5 5 0 0  Altered sleeping 2 3 - -  Tired, decreased energy 3 3 - -  Change in appetite 3 3 - -  Feeling bad or failure about yourself  2 2 - -  Trouble concentrating 3 3 - -  Moving slowly or fidgety/restless 2 3 - -  Suicidal thoughts 2 2 - -  PHQ-9 Score 22 24 - -  Difficult doing work/chores Not difficult at all - - -    Review of Systems  Constitutional: Positive for appetite change.  Gastrointestinal: Positive for constipation.  Endocrine:       High blood sugar  Genitourinary: Positive for difficulty urinating.  Musculoskeletal: Positive for back pain and gait problem.       Spasms  Skin: Positive for rash.  Neurological: Positive for dizziness,  weakness and numbness.       Tingling   Psychiatric/Behavioral: The patient is nervous/anxious.        Objective:   Physical Exam        Assessment & Plan:

## 2016-03-11 ENCOUNTER — Telehealth: Payer: Self-pay | Admitting: Internal Medicine

## 2016-03-11 ENCOUNTER — Ambulatory Visit: Payer: Self-pay | Attending: Internal Medicine

## 2016-03-11 DIAGNOSIS — M5416 Radiculopathy, lumbar region: Secondary | ICD-10-CM

## 2016-03-11 NOTE — Telephone Encounter (Signed)
Pt came in to the office and asked to speak with nurse regarding his meds. Pt needs refills for (TYLENOL #3) 300-30 MG tablet and cyclobenzaprine (FLEXERIL) 10 MG tablet. Please follow up.  Thank you.

## 2016-03-12 MED FILL — CYCLOBENZAPRINE 10 MG TAB: 10 | 30 days supply | Qty: 60 | Fill #1

## 2016-03-15 MED ORDER — CYCLOBENZAPRINE HCL 10 MG PO TABS: 10.0000 mg | ORAL_TABLET | Freq: Two times a day (BID) | ORAL | 3 refills | Status: DC | PRN

## 2016-03-15 MED ORDER — ACETAMINOPHEN-CODEINE #3 300-30 MG PO TABS: 1.0000 | ORAL_TABLET | Freq: Four times a day (QID) | ORAL | 0 refills | Status: DC | PRN

## 2016-03-15 NOTE — Telephone Encounter (Signed)
Contacted pt to make aware his rx for tylenol was ready for pickup pt will be by to pick up

## 2016-03-15 NOTE — Telephone Encounter (Signed)
Renewed. Please call him rx is ready for pick up. thanks

## 2016-03-16 ENCOUNTER — Other Ambulatory Visit: Payer: Self-pay | Admitting: Internal Medicine

## 2016-03-16 DIAGNOSIS — N529 Male erectile dysfunction, unspecified: Secondary | ICD-10-CM

## 2016-03-16 MED FILL — $VIAGRA 100 MG TABLET: 100 | 30 days supply | Qty: 10 | Fill #0

## 2016-03-17 MED FILL — ACETAMINOPHEN/COD #3 TABLET: 300-30 | 15 days supply | Qty: 60 | Fill #0

## 2016-03-19 ENCOUNTER — Ambulatory Visit: Payer: Self-pay | Attending: Internal Medicine

## 2016-04-13 ENCOUNTER — Other Ambulatory Visit: Payer: Self-pay | Admitting: Internal Medicine

## 2016-04-13 MED FILL — $VIAGRA 100 MG TABLET: 100 | 30 days supply | Qty: 10 | Fill #1

## 2016-04-13 MED FILL — ?CYCLOBENZAPRINE 10 MG TABL: 10 | 30 days supply | Qty: 60 | Fill #2

## 2016-04-13 NOTE — Telephone Encounter (Signed)
Please call pt to pick up rx. thx

## 2016-04-14 NOTE — Telephone Encounter (Signed)
Contacted pt to make aware his rx for tylenol 3 lvm regarding information

## 2016-04-15 MED FILL — ACETAMINOPHEN/COD #3 TABLET: 300-30 | 15 days supply | Qty: 60 | Fill #0

## 2016-05-11 ENCOUNTER — Other Ambulatory Visit: Payer: Self-pay | Admitting: Internal Medicine

## 2016-05-11 MED FILL — ?AMLODIPINE BESYLATE 10 MG: 10 | 30 days supply | Qty: 30 | Fill #2

## 2016-05-11 MED FILL — ?CYCLOBENZAPRINE 10 MG TABL: 10 | 30 days supply | Qty: 60 | Fill #3

## 2016-05-12 NOTE — Telephone Encounter (Signed)
Medical Assistant left message on patient's home and cell voicemail. Voicemail states to give a call back to [place] with [facility] at [phone].  !!!Please inform patient of tylenol 3 being placed at the front desk for pickup!!!

## 2016-05-24 MED FILL — $VIAGRA 100 MG TABLET: 100 | 30 days supply | Qty: 10 | Fill #2

## 2016-06-03 MED FILL — AMLODIPINE BESYLATE 10 MG T: 10 | 30 days supply | Qty: 30 | Fill #3

## 2016-06-15 ENCOUNTER — Telehealth: Payer: Self-pay | Admitting: Internal Medicine

## 2016-06-15 MED ORDER — ACETAMINOPHEN-CODEINE #3 300-30 MG PO TABS: ORAL_TABLET | ORAL | 0 refills | Status: DC

## 2016-06-15 NOTE — Telephone Encounter (Signed)
Medication Refill: Tylenol #3 °

## 2016-06-21 MED FILL — $VIAGRA 100 MG TABLET: 100 | 30 days supply | Qty: 10 | Fill #3

## 2016-06-30 MED FILL — CYCLOBENZAPRINE 10 MG TAB: 10 | 30 days supply | Qty: 60 | Fill #0

## 2016-06-30 MED FILL — ACETAMINOPHEN/COD #3 TABLET: 300-30 | 15 days supply | Qty: 60 | Fill #0

## 2016-07-05 ENCOUNTER — Ambulatory Visit: Payer: Self-pay

## 2016-07-05 ENCOUNTER — Ambulatory Visit: Payer: Self-pay | Admitting: Internal Medicine

## 2016-07-06 ENCOUNTER — Other Ambulatory Visit: Payer: Self-pay | Admitting: Internal Medicine

## 2016-07-06 ENCOUNTER — Encounter: Payer: Self-pay | Admitting: Internal Medicine

## 2016-07-06 ENCOUNTER — Ambulatory Visit: Payer: Self-pay | Attending: Internal Medicine | Admitting: Internal Medicine

## 2016-07-06 VITALS — BP 156/109 | HR 83 | Temp 97.8°F | Resp 16 | Wt 166.6 lb

## 2016-07-06 DIAGNOSIS — I10 Essential (primary) hypertension: Secondary | ICD-10-CM | POA: Insufficient documentation

## 2016-07-06 DIAGNOSIS — G894 Chronic pain syndrome: Secondary | ICD-10-CM | POA: Insufficient documentation

## 2016-07-06 DIAGNOSIS — M549 Dorsalgia, unspecified: Secondary | ICD-10-CM | POA: Insufficient documentation

## 2016-07-06 DIAGNOSIS — Z79899 Other long term (current) drug therapy: Secondary | ICD-10-CM | POA: Insufficient documentation

## 2016-07-06 DIAGNOSIS — Z131 Encounter for screening for diabetes mellitus: Secondary | ICD-10-CM | POA: Insufficient documentation

## 2016-07-06 DIAGNOSIS — Z7982 Long term (current) use of aspirin: Secondary | ICD-10-CM | POA: Insufficient documentation

## 2016-07-06 DIAGNOSIS — F17210 Nicotine dependence, cigarettes, uncomplicated: Secondary | ICD-10-CM | POA: Insufficient documentation

## 2016-07-06 DIAGNOSIS — Z720 Tobacco use: Secondary | ICD-10-CM

## 2016-07-06 LAB — POCT GLYCOSYLATED HEMOGLOBIN (HGB A1C): Hemoglobin A1C: 5.6

## 2016-07-06 MED ORDER — AMLODIPINE BESYLATE 10 MG PO TABS: 10.0000 mg | ORAL_TABLET | Freq: Every day | ORAL | 3 refills | Status: DC

## 2016-07-06 MED ORDER — NICOTINE 7 MG/24HR TD PT24: 7.0000 mg | MEDICATED_PATCH | Freq: Every day | TRANSDERMAL | 0 refills | Status: DC

## 2016-07-06 MED ORDER — HYDROCHLOROTHIAZIDE 25 MG PO TABS: 25.0000 mg | ORAL_TABLET | Freq: Every day | ORAL | 3 refills | Status: DC

## 2016-07-06 NOTE — Progress Notes (Signed)
Pt is in the office today for lower back pain Pt states his pain level today in the office is a 7 Pt states he is wanting to go to a pain clinic because the tylenol 3 is not helping

## 2016-07-06 NOTE — Progress Notes (Signed)
Calvin [name], is a 55 y.o. male  [id number]  [id number]  DOB - 1961/07/23  Chief Complaint  Patient presents with  . Back Pain        Subjective:   Calvin [name] is a 55 y.o. male here today for a follow up visit, for chronic back pain, htn, tob abuse. Per pt, forgot to take his norvasc this am.  Pain constant. Further clarification with pt notes that he only saw Dr Ernestina Patches (PMR) and Dr Lorin Mercy (nsg) once in July. Per pt, NSG pending once he gets his disability, still in the works.  Of note, pt requesting to see Dr Rosana Berger, w/ PMR again, since he is not seeing Dr Ernestina Patches any longer.  Admits to not watching diet much, fastfoods, canned goods.   Patient has No headache, No chest pain, No abdominal pain - No Nausea, No new weakness tingling or numbness, No Cough - SOB.  No problems updated.  ALLERGIES: No Known Allergies  PAST MEDICAL HISTORY: Past Medical History:  Diagnosis Date  . Arthritis   . Disc disorder   . Hypertension   . Lumbar herniated disc     MEDICATIONS AT HOME: Prior to Admission medications   Medication Sig Start Date End Date Taking? Authorizing Provider  acetaminophen (TYLENOL) 500 MG tablet Take 1,000 mg by mouth every 6 (six) hours as needed for headache.    Historical Provider, MD  acetaminophen-codeine (TYLENOL #3) 300-30 MG tablet TAKE 1 TABLET BY MOUTH EVERY 6 HOURS AS NEEDED FOR MODERATE PAIN. 06/15/16   Maren Reamer, MD  amLODipine (NORVASC) 10 MG tablet Take 1 tablet (10 mg total) by mouth daily. 07/06/16   Maren Reamer, MD  Aspirin-Salicylamide-Caffeine (BC FAST PAIN RELIEF) [phone] MG PACK Take 1 Package by mouth every 6 (six) hours as needed (headaches).    Historical Provider, MD  cyclobenzaprine (FLEXERIL) 10 MG tablet Take 1 tablet (10 mg total) by mouth 2 (two) times daily as needed for muscle spasms. 03/15/16   Maren Reamer, MD  hydrochlorothiazide (HYDRODIURIL) 25 MG tablet Take 1 tablet (25 mg total) by mouth daily.  07/06/16   Maren Reamer, MD  nicotine (NICODERM CQ) 7 mg/24hr patch Place 1 patch (7 mg total) onto the skin daily. 07/06/16   Maren Reamer, MD  VIAGRA 100 MG tablet TAKE 1 TABLET BY MOUTH DAILY AS NEEDED FOR ERECTILE DYSFUNCTION. 03/16/16   Maren Reamer, MD  Vitamin D, Ergocalciferol, (DRISDOL) 50000 units CAPS capsule Take 1 capsule (50,000 Units total) by mouth every 7 (seven) days. Patient not taking: Reported on 07/06/2016 12/02/15   Maren Reamer, MD     Objective:   Vitals:   07/06/16 0925  BP: (!) 156/109  Pulse: 83  Resp: 16  Temp: 97.8 F (36.6 C)  TempSrc: Oral  SpO2: 99%  Weight: 166 lb 9.6 oz (75.6 kg)    Exam General appearance : Awake, alert, not in any distress. Speech Clear. Not toxic looking, pleasant, good eye contact. Walking w/ cane, ROM intact, able to get on examining table w/o difficulty. HEENT: Atraumatic and Normocephalic, pupils equally reactive to light. Neck: supple, no JVD.   Chest:Good air entry bilaterally, no added sounds. CVS: S1 S2 regular, no murmurs/gallups or rubs. Abdomen: Bowel sounds active, Non tender and not distended with no gaurding, rigidity or rebound. Extremities: B/L Lower Ext shows no edema, both legs are warm to touch Neurology: Awake alert, and oriented X 3, CN II-XII grossly intact, Non focal  Skin:No Rash  Data Review Lab Results  Component Value Date   HGBA1C CANCELED 12/01/2015    Depression screen [facility] 2/9 07/06/2016 03/05/2016 02/02/2016 12/01/2015 12/01/2015  Decreased Interest 3 2 3 3  0  Down, Depressed, Hopeless 3 2 2 2  0  PHQ - 2 Score 6 4 5 5  0  Altered sleeping 3 2 2 3  -  Tired, decreased energy 3 2 3 3  -  Change in appetite 2 2 3 3  -  Feeling bad or failure about yourself  2 2 2 2  -  Trouble concentrating 3 2 3 3  -  Moving slowly or fidgety/restless 0 1 2 3  -  Suicidal thoughts 0 1 2 2  -  PHQ-9 Score 19 16 22 24  -  Difficult doing work/chores - Very difficult Not difficult at all - -       Assessment & Plan   1. Essential hypertension, benign Uncontrolled, dw low salt diet again., fresh/frozen veg better than canned goods, less fast food - continue norvasc 10 - start hctz 25 qd  2. Chronic pain syndrome dw pt at length, he wishes to see Dr Rosana Berger again, currently not seeing Dr Ernestina Patches or Denice Bors. - Drug Screen, Urine - Ambulatory referral to Pain Clinic  3. Diabetes mellitus screening - HgB A1c  4. Tobacco abuse Total cessation recd, down to 2-3 cigs/day Trial nicoderm 7mg  patch qd, instructed not to smoke w/ this.     Patient have been counseled extensively about nutrition and exercise  Return in about 3 months (around 10/04/2016), or if symptoms worsen or fail to improve.  The patient was given clear instructions to go to ER or return to medical center if symptoms don't improve, worsen or new problems develop. The patient verbalized understanding. The patient was told to call to get lab results if they haven't heard anything in the next week.   This note has been created with Surveyor, quantity. Any transcriptional errors are unintentional.   Maren Reamer, MD, [place] [facility] [place], [place]   07/06/2016, 9:42 AM

## 2016-07-06 NOTE — Patient Instructions (Signed)
Low-Sodium Eating Plan Sodium raises blood pressure and causes water to be held in the body. Getting less sodium from food will help lower your blood pressure, reduce any swelling, and protect your heart, liver, and kidneys. We get sodium by adding salt (sodium chloride) to food. Most of our sodium comes from canned, boxed, and frozen foods. Restaurant foods, fast foods, and pizza are also very high in sodium. Even if you take medicine to lower your blood pressure or to reduce fluid in your body, getting less sodium from your food is important. What is my plan? Most people should limit their sodium intake to 2,300 mg a day. Your health care provider recommends that you limit your sodium intake to 2,000mg  a day. What do I need to know about this eating plan? For the low-sodium eating plan, you will follow these general guidelines:  Choose foods with a % Daily Value for sodium of less than 5% (as listed on the food label).  Use salt-free seasonings or herbs instead of table salt or sea salt.  Check with your health care provider or pharmacist before using salt substitutes.  Eat fresh foods.  Eat more vegetables and fruits.  Limit canned vegetables. If you do use them, rinse them well to decrease the sodium.  Limit cheese to 1 oz (28 g) per day.  Eat lower-sodium products, often labeled as "lower sodium" or "no salt added."  Avoid foods that contain monosodium glutamate (MSG). MSG is sometimes added to [place] food and some canned foods.  Check food labels (Nutrition Facts labels) on foods to learn how much sodium is in one serving.  Eat more home-cooked food and less restaurant, buffet, and fast food.  When eating at a restaurant, ask that your food be prepared with less salt, or no salt if possible. How do I read food labels for sodium information? The Nutrition Facts label lists the amount of sodium in one serving of the food. If you eat more than one serving, you must multiply the  listed amount of sodium by the number of servings. Food labels may also identify foods as:  Sodium free-Less than 5 mg in a serving.  Very low sodium-35 mg or less in a serving.  Low sodium-140 mg or less in a serving.  Light in sodium-50% less sodium in a serving. For example, if a food that usually has 300 mg of sodium is changed to become light in sodium, it will have 150 mg of sodium.  Reduced sodium-25% less sodium in a serving. For example, if a food that usually has 400 mg of sodium is changed to reduced sodium, it will have 300 mg of sodium. What foods can I eat? Grains  Low-sodium cereals, including oats, puffed wheat and rice, and shredded wheat cereals. Low-sodium crackers. Unsalted rice and pasta. Lower-sodium bread. Vegetables  Frozen or fresh vegetables. Low-sodium or reduced-sodium canned vegetables. Low-sodium or reduced-sodium tomato sauce and paste. Low-sodium or reduced-sodium tomato and vegetable juices. Fruits  Fresh, frozen, and canned fruit. Fruit juice. Meat and Other Protein Products  Low-sodium canned tuna and salmon. Fresh or frozen meat, poultry, seafood, and fish. Lamb. Unsalted nuts. Dried beans, peas, and lentils without added salt. Unsalted canned beans. Homemade soups without salt. Eggs. Dairy  Milk. Soy milk. Ricotta cheese. Low-sodium or reduced-sodium cheeses. Yogurt. Condiments  Fresh and dried herbs and spices. Salt-free seasonings. Onion and garlic powders. Low-sodium varieties of mustard and ketchup. Fresh or refrigerated horseradish. Lemon juice. Fats and Oils  Reduced-sodium  salad dressings. Unsalted butter. Other  Unsalted popcorn and pretzels. The items listed above may not be a complete list of recommended foods or beverages. Contact your dietitian for more options.  What foods are not recommended? Grains  Instant hot cereals. Bread stuffing, pancake, and biscuit mixes. Croutons. Seasoned rice or pasta mixes. Noodle soup cups. Boxed or  frozen macaroni and cheese. Self-rising flour. Regular salted crackers. Vegetables  Regular canned vegetables. Regular canned tomato sauce and paste. Regular tomato and vegetable juices. Frozen vegetables in sauces. Salted [place] fries. Olives. Angie Fava. Relishes. Sauerkraut. Salsa. Meat and Other Protein Products  Salted, canned, smoked, spiced, or pickled meats, seafood, or fish. Bacon, ham, sausage, hot dogs, corned beef, chipped beef, and packaged luncheon meats. Salt pork. Jerky. Pickled herring. Anchovies, regular canned tuna, and sardines. Salted nuts. Dairy  Processed cheese and cheese spreads. Cheese curds. Blue cheese and cottage cheese. Buttermilk. Condiments  Onion and garlic salt, seasoned salt, table salt, and sea salt. Canned and packaged gravies. Worcestershire sauce. Tartar sauce. Barbecue sauce. Teriyaki sauce. Soy sauce, including reduced sodium. Steak sauce. Fish sauce. Oyster sauce. Cocktail sauce. Horseradish that you find on the shelf. Regular ketchup and mustard. Meat flavorings and tenderizers. Bouillon cubes. Hot sauce. Tabasco sauce. Marinades. Taco seasonings. Relishes. Fats and Oils  Regular salad dressings. Salted butter. Margarine. Ghee. Bacon fat. Other  Potato and tortilla chips. Corn chips and puffs. Salted popcorn and pretzels. Canned or dried soups. Pizza. Frozen entrees and pot pies. The items listed above may not be a complete list of foods and beverages to avoid. Contact your dietitian for more information.  This information is not intended to replace advice given to you by your health care provider. Make sure you discuss any questions you have with your health care provider. Document Released: 01/01/2002 Document Revised: 12/18/2015 Document Reviewed: 05/16/2013 Elsevier Interactive Patient Education  2017 Elsevier Inc.  -   Hypertension Hypertension is another name for high blood pressure. High blood pressure forces your heart to work harder to pump  blood. A blood pressure reading has two numbers, which includes a higher number over a lower number (example: 110/72). Follow these instructions at home:  Have your blood pressure rechecked by your doctor.  Only take medicine as told by your doctor. Follow the directions carefully. The medicine does not work as well if you skip doses. Skipping doses also puts you at risk for problems.  Do not smoke.  Monitor your blood pressure at home as told by your doctor. Contact a doctor if:  You think you are having a reaction to the medicine you are taking.  You have repeat headaches or feel dizzy.  You have puffiness (swelling) in your ankles.  You have trouble with your vision. Get help right away if:  You get a very bad headache and are confused.  You feel weak, numb, or faint.  You get chest or belly (abdominal) pain.  You throw up (vomit).  You cannot breathe very well. This information is not intended to replace advice given to you by your health care provider. Make sure you discuss any questions you have with your health care provider. Document Released: 12/29/2007 Document Revised: 12/18/2015 Document Reviewed: 05/04/2013 Elsevier Interactive Patient Education  2017 Elsevier Inc.   -  Cholesterol Cholesterol is a fat. Your body needs a small amount of cholesterol. Cholesterol (plaque) may build up in your blood vessels (arteries). That makes you more likely to have a heart attack or stroke. You cannot feel  your cholesterol level. Having a blood test is the only way to find out if your level is high. Keep your test results. Work with your doctor to keep your cholesterol at a good level. What do the results mean?  Total cholesterol is how much cholesterol is in your blood.  LDL is bad cholesterol. This is the type that can build up. Try to have low LDL.  HDL is good cholesterol. It cleans your blood vessels and carries LDL away. Try to have high HDL.  Triglycerides are  fat that the body can store or burn for energy. What are good levels of cholesterol?  Total cholesterol below 200.  LDL below 100 is good for people who have health risks. LDL below 70 is good for people who have very high risks.  HDL above 40 is good. It is best to have HDL of 60 or higher.  Triglycerides below 150. How can I lower my cholesterol? Diet  Follow your diet program as told by your doctor.  Choose fish, white meat chicken, or [place] that is roasted or baked. Try not to eat red meat, fried foods, sausage, or lunch meats.  Eat lots of fresh fruits and vegetables.  Choose whole grains, beans, pasta, potatoes, and cereals.  Choose olive oil, corn oil, or canola oil. Only use small amounts.  Try not to eat butter, mayonnaise, shortening, or palm kernel oils.  Try not to eat foods with trans fats.  Choose low-fat or nonfat dairy foods.  Drink skim or nonfat milk.  Eat low-fat or nonfat yogurt and cheeses.  Try not to drink whole milk or cream.  Try not to eat ice cream, egg yolks, or full-fat cheeses.  Healthy desserts include angel food cake, ginger snaps, animal crackers, hard candy, popsicles, and low-fat or nonfat frozen yogurt. Try not to eat pastries, cakes, pies, and cookies. Exercise  Follow your exercise program as told by your doctor.  Be more active. Try gardening, walking, and taking the stairs.  Ask your doctor about ways that you can be more active. Medicine  Take over-the-counter and prescription medicines only as told by your doctor. This information is not intended to replace advice given to you by your health care provider. Make sure you discuss any questions you have with your health care provider. Document Released: 10/08/2008 Document Revised: 02/11/2016 Document Reviewed: 01/22/2016 Elsevier Interactive Patient Education  2017 [employer].  -   Steps to Quit Smoking Smoking tobacco can be bad for your health. It can also affect  almost every organ in your body. Smoking puts you and people around you at risk for many serious long-lasting (chronic) diseases. Quitting smoking is hard, but it is one of the best things that you can do for your health. It is never too late to quit. What are the benefits of quitting smoking? When you quit smoking, you lower your risk for getting serious diseases and conditions. They can include:  Lung cancer or lung disease.  Heart disease.  Stroke.  Heart attack.  Not being able to have children (infertility).  Weak bones (osteoporosis) and broken bones (fractures). If you have coughing, wheezing, and shortness of breath, those symptoms may get better when you quit. You may also get sick less often. If you are pregnant, quitting smoking can help to lower your chances of having a baby of low birth weight. What can I do to help me quit smoking? Talk with your doctor about what can help you quit smoking.  Some things you can do (strategies) include:  Quitting smoking totally, instead of slowly cutting back how much you smoke over a period of time.  Going to in-person counseling. You are more likely to quit if you go to many counseling sessions.  Using resources and support systems, such as:  Online chats with a Social worker.  Phone quitlines.  Printed Furniture conservator/restorer.  Support groups or group counseling.  Text messaging programs.  Mobile phone apps or applications.  Taking medicines. Some of these medicines may have nicotine in them. If you are pregnant or breastfeeding, do not take any medicines to quit smoking unless your doctor says it is okay. Talk with your doctor about counseling or other things that can help you. Talk with your doctor about using more than one strategy at the same time, such as taking medicines while you are also going to in-person counseling. This can help make quitting easier. What things can I do to make it easier to quit? Quitting smoking might feel  very hard at first, but there is a lot that you can do to make it easier. Take these steps:  Talk to your family and friends. Ask them to support and encourage you.  Call phone quitlines, reach out to support groups, or work with a Social worker.  Ask people who smoke to not smoke around you.  Avoid places that make you want (trigger) to smoke, such as:  Bars.  Parties.  Smoke-break areas at work.  Spend time with people who do not smoke.  Lower the stress in your life. Stress can make you want to smoke. Try these things to help your stress:  Getting regular exercise.  Deep-breathing exercises.  Yoga.  Meditating.  Doing a body scan. To do this, close your eyes, focus on one area of your body at a time from head to toe, and notice which parts of your body are tense. Try to relax the muscles in those areas.  Download or buy apps on your mobile phone or tablet that can help you stick to your quit plan. There are many free apps, such as QuitGuide from the [employer] Office manager for Disease Control and Prevention). You can find more support from smokefree.gov and other websites. This information is not intended to replace advice given to you by your health care provider. Make sure you discuss any questions you have with your health care provider. Document Released: 05/08/2009 Document Revised: 03/09/2016 Document Reviewed: 11/26/2014 Elsevier Interactive Patient Education  2017 [employer].

## 2016-07-14 ENCOUNTER — Encounter: Payer: Self-pay | Admitting: Internal Medicine

## 2016-07-14 LAB — DRUG ABUSE PANEL 10-50, U
AMPHETAMINES (1000 ng/mL SCRN): NEGATIVE
BARBITURATES: NEGATIVE
BENZODIAZEPINES: NEGATIVE
COCAINE METABOLITES: POSITIVE — AB
CODEINE: POSITIVE — AB
HYDROMORPHONE: POSITIVE — AB
MARIJUANA MET (50 ng/mL SCRN): NEGATIVE
METHADONE: NEGATIVE
METHAQUALONE: NEGATIVE
MORPHINE: POSITIVE — AB
OPIATES: POSITIVE — AB
PHENCYCLIDINE: NEGATIVE
PROPOXYPHENE: NEGATIVE

## 2016-07-14 NOTE — Progress Notes (Signed)
Pt will NO longer get controlled substances from our clinic. He had pain contract with [name], was received Tylenol - #3, but random tox screen 07/06/16 + for cocaine, hydromorphone, opiates, morphine and codeine.  - case discussed w/ CMA so she is aware. - also case dw Med Director, Dr Doreene Burke.

## 2016-07-21 MED FILL — $VIAGRA 100 MG TABLET: 100 | 30 days supply | Qty: 10 | Fill #4

## 2016-07-23 MED FILL — HYDROCHLOROTHIAZIDE 25 MG T: 25 | 30 days supply | Qty: 30 | Fill #0

## 2016-07-23 MED FILL — AMLODIPINE BESYLATE 10 MG T: 10 | 30 days supply | Qty: 30 | Fill #0

## 2016-08-03 ENCOUNTER — Encounter: Payer: Self-pay | Admitting: Physical Medicine & Rehabilitation

## 2016-08-03 ENCOUNTER — Ambulatory Visit (HOSPITAL_BASED_OUTPATIENT_CLINIC_OR_DEPARTMENT_OTHER): Payer: Self-pay | Admitting: Physical Medicine & Rehabilitation

## 2016-08-03 ENCOUNTER — Encounter: Payer: Self-pay | Attending: Physical Medicine & Rehabilitation

## 2016-08-03 VITALS — BP 143/100 | HR 95 | Resp 14

## 2016-08-03 DIAGNOSIS — M545 Low back pain, unspecified: Secondary | ICD-10-CM

## 2016-08-03 DIAGNOSIS — G8929 Other chronic pain: Secondary | ICD-10-CM | POA: Insufficient documentation

## 2016-08-03 DIAGNOSIS — M47896 Other spondylosis, lumbar region: Secondary | ICD-10-CM | POA: Insufficient documentation

## 2016-08-03 DIAGNOSIS — M1288 Other specific arthropathies, not elsewhere classified, other specified site: Secondary | ICD-10-CM

## 2016-08-03 DIAGNOSIS — M2010 Hallux valgus (acquired), unspecified foot: Secondary | ICD-10-CM | POA: Insufficient documentation

## 2016-08-03 DIAGNOSIS — M47816 Spondylosis without myelopathy or radiculopathy, lumbar region: Secondary | ICD-10-CM

## 2016-08-03 NOTE — Progress Notes (Signed)
Subjective:    Patient ID: Calvin [name], male    DOB: May 18, 1961, 56 y.o.   MRN: [id number]  HPI 56 year old male with history of low back pain of approximately 2 year duration. He is not had any pain radiating down his legs. He does have some toe pain but no numbness or tingling. No ecent trauma. He notes that he feels a little weak in his legs but no falls. He is independent with all his self-care and mobility. He does not use an assistive device. He has had no weight loss, fever or chills. He reports having had discal therapy for 2 or 3 weeks. He reports no improvements with spine injections, record review showed bilateral L5 transforaminal injections performed for 20 11/12/2013 as well as 01/07/2014 Thoracic spine MRI 04/29/2014, had minimal disc degeneration several level, no stenosis or cord signal changes Lumbar MRI 04/29/2014 showed mild to moderate facet arthropathy without any significant stenosis. There was some abutment upon the L5 nerve roots Patient recently underwent orthopedic surgery evaluation and no lumbar surgery was recommended by Dr. Lorin Mercy. Also, saw another physical medicine rehabilitation physician, but reportedly did not do repeat injections.  Was seen in our clinic in August, after urine sample was obtained, there was found in the waste can a discarded condom with urine in it as well as duct tape on it.  Recent urine toxicology 07/06/2016 performed in memory office demonstrated positive cocaine, positive morphine, positive hydromorphone, positive codeine Patient states current medication is Tylenol with codeine as well as Aleve Pain Inventory Average Pain 8 Pain Right Now 9 My pain is sharp  In the last 24 hours, has pain interfered with the following? General activity 9 Relation with others 7 Enjoyment of life 8 What TIME of day is your pain at its worst? morning Sleep (in general) Poor  Pain is worse with: walking, bending, sitting, standing and some  activites Pain improves with: therapy/exercise and medication Relief from Meds: 8  Mobility walk with assistance use a cane how many minutes can you walk? 5 ability to climb steps?  yes do you drive?  no transfers alone  Function not employed: date last employed 04/22/2014 disabled: date disabled 04/22/2014 I need assistance with the following:  household duties and shopping  Neuro/Psych bladder control problems bowel control problems weakness numbness tingling trouble walking spasms depression anxiety  Prior Studies Any changes since last visit?  no  Physicians involved in your care Any changes since last visit?  no   Family History  Problem Relation Age of Onset  . Diabetes Mother   . Hypertension Mother   . Heart disease Mother   . Hyperlipidemia Mother   . Diabetes Brother   . Diabetes Daughter   . Hypertension Sister   . Diabetes Sister    Social History   Social History  . Marital status: Single    Spouse name: N/A  . Number of children: N/A  . Years of education: N/A   Social History Main Topics  . Smoking status: Current Every Day Smoker    Packs/day: 0.10    Years: 20.00    Types: Cigarettes  . Smokeless tobacco: Never Used  . Alcohol use Yes     Comment: rarely  . Drug use: No  . Sexual activity: Not Asked   Other Topics Concern  . None   Social History Narrative  . None   Past Surgical History:  Procedure Laterality Date  . FOOT SURGERY     right, ~  2002   Past Medical History:  Diagnosis Date  . Arthritis   . Disc disorder   . Hypertension   . Lumbar herniated disc    BP (!) 143/100   Pulse 95   Resp 14   SpO2 98%   Opioid Risk Score:   Fall Risk Score:  `1  Depression screen PHQ 2/9  Depression screen [facility] 2/9 07/06/2016 03/05/2016 02/02/2016 12/01/2015 12/01/2015 08/06/2014  Decreased Interest 3 2 3 3  0 0  Down, Depressed, Hopeless 3 2 2 2  0 0  PHQ - 2 Score 6 4 5 5  0 0  Altered sleeping 3 2 2 3  - -  Tired,  decreased energy 3 2 3 3  - -  Change in appetite 2 2 3 3  - -  Feeling bad or failure about yourself  2 2 2 2  - -  Trouble concentrating 3 2 3 3  - -  Moving slowly or fidgety/restless 0 1 2 3  - -  Suicidal thoughts 0 1 2 2  - -  PHQ-9 Score 19 16 22 24  - -  Difficult doing work/chores - Very difficult Not difficult at all - - -    Review of Systems  HENT: Negative.   Eyes: Negative.   Respiratory: Negative.   Cardiovascular: Negative.   Gastrointestinal:       Bowel urgency  Endocrine: Negative.   Genitourinary: Positive for urgency.  Musculoskeletal: Positive for back pain and gait problem.       Spasms  Skin: Negative.   Allergic/Immunologic: Negative.   Neurological: Positive for weakness and numbness.       Tingling  Psychiatric/Behavioral: Positive for dysphoric mood. The patient is nervous/anxious.   All other systems reviewed and are negative.      Objective:   Physical Exam  Constitutional: He is oriented to person, place, and time. He appears well-developed and well-nourished.  HENT:  Head: Normocephalic and atraumatic.  Eyes: Conjunctivae and EOM are normal. Pupils are equal, round, and reactive to light.  Neck: Neck supple.  Musculoskeletal:       Thoracic back: He exhibits normal range of motion, no tenderness and no deformity.       Lumbar back: He exhibits decreased range of motion. He exhibits no tenderness, no deformity and no spasm.       Right foot: There is deformity.       Left foot: There is deformity.  Negative straight leg raising test No tenderness. Palpation along the lumbar paraspinals. There is decreased range of motion  with lumbar extension,  normal with lumbar flexion.  Hallux valgus deformity bilateral great toes. Pain with range of motion. No joint effusion. No erythema at the MTP  Neurological: He is alert and oriented to person, place, and time. He displays no atrophy. He exhibits normal muscle tone. Gait normal.  Normal sensation.  Bilateral lower limbs  Psychiatric: He has a normal mood and affect. His behavior is normal. Judgment and thought content normal.  Nursing note and vitals reviewed.         Assessment & Plan:  1. Lumbar spondylosis. This is likely pain generator. Also has some lumbar degenerative disc. No significant stenosis. No radiculopathy. As discussed with patient. Would recommend nonsteroidal anti-inflammatory, lumbar spine exercises. Given that he is already tried lumbar injections would not recommend repeat. Do not think pathology sufficient to warrant chronic narcotic analgesics. In addition, he had cocaine-positive urine as well as a documented episode of providing a clean substituted sample.  Do  not have anything more to offer this patient at the current time. Had printed out some lumbar spine exercises.  2. Hallux valgus may benefit from podiatry consult

## 2016-08-03 NOTE — Patient Instructions (Signed)

## 2016-08-06 ENCOUNTER — Telehealth: Payer: Self-pay | Admitting: Internal Medicine

## 2016-08-06 NOTE — Telephone Encounter (Signed)
Patient called requesting change in medication. Would like to stop taking Tylenol 3 and return to taking Tramadol. Please f/u

## 2016-08-09 NOTE — Telephone Encounter (Signed)
We are unable to provide him further narcotics.  Per his PMR f/u visit w/ Dr Mitzi Hansen on 08/03/16, he "did not think pathology sufficient to warrant chronic narcotic analgesics. In addition, he had cocaine-positive urine as well as a documented episode of providing a clean substituted sample."  Will defer to pmr for further pain meds. Thanks.

## 2016-08-18 MED FILL — $VIAGRA 100 MG TABLET: 100 | 30 days supply | Qty: 10 | Fill #5

## 2016-10-28 ENCOUNTER — Telehealth: Payer: Self-pay

## 2016-10-28 NOTE — Telephone Encounter (Signed)
Called to find out if pt want colonoscopy scheduled 

## 2016-10-29 MED FILL — $VIAGRA 100 MG TABLET: 100 | 30 days supply | Qty: 10 | Fill #6

## 2016-10-29 MED FILL — HYDROCHLOROTHIAZIDE 25 MG T: 25 | 30 days supply | Qty: 30 | Fill #1

## 2016-10-29 MED FILL — AMLODIPINE BESYLATE 10 MG T: 10 | 30 days supply | Qty: 30 | Fill #1

## 2016-11-03 ENCOUNTER — Ambulatory Visit: Payer: Self-pay | Attending: Internal Medicine

## 2016-11-05 ENCOUNTER — Encounter (HOSPITAL_COMMUNITY): Payer: Self-pay | Admitting: Emergency Medicine

## 2016-11-05 ENCOUNTER — Emergency Department (HOSPITAL_COMMUNITY)
Admission: EM | Admit: 2016-11-05 | Discharge: 2016-11-05 | Disposition: A | Discharge status: Home / Self Care | Payer: Self-pay | Attending: Emergency Medicine | Admitting: Emergency Medicine

## 2016-11-05 DIAGNOSIS — F17210 Nicotine dependence, cigarettes, uncomplicated: Secondary | ICD-10-CM | POA: Insufficient documentation

## 2016-11-05 DIAGNOSIS — I10 Essential (primary) hypertension: Secondary | ICD-10-CM | POA: Insufficient documentation

## 2016-11-05 DIAGNOSIS — Z7982 Long term (current) use of aspirin: Secondary | ICD-10-CM | POA: Insufficient documentation

## 2016-11-05 DIAGNOSIS — M5442 Lumbago with sciatica, left side: Secondary | ICD-10-CM | POA: Insufficient documentation

## 2016-11-05 DIAGNOSIS — G8929 Other chronic pain: Secondary | ICD-10-CM | POA: Insufficient documentation

## 2016-11-05 DIAGNOSIS — Z79899 Other long term (current) drug therapy: Secondary | ICD-10-CM | POA: Insufficient documentation

## 2016-11-05 DIAGNOSIS — M5441 Lumbago with sciatica, right side: Secondary | ICD-10-CM | POA: Insufficient documentation

## 2016-11-05 MED ORDER — PREDNISONE 20 MG PO TABS: 40.0000 mg | ORAL_TABLET | Freq: Every day | ORAL | 0 refills | Status: DC

## 2016-11-05 MED ORDER — TRAMADOL HCL 50 MG PO TABS: 50.0000 mg | ORAL_TABLET | Freq: Four times a day (QID) | ORAL | 0 refills | Status: DC | PRN

## 2016-11-05 MED FILL — traMADol HCL 50 MG TABS: 50 | 3 days supply | Qty: 15 | Fill #0

## 2016-11-05 NOTE — Discharge Instructions (Signed)
Please read attached information. If you experience any new or worsening signs or symptoms please return to the emergency room for evaluation. Please follow-up with your primary care provider or specialist as discussed. Please use medication prescribed only as directed and discontinue taking if you have any concerning signs or symptoms.   °

## 2016-11-05 NOTE — ED Provider Notes (Signed)
[facility] DEPT Provider Note   CSN: [id number] Arrival date & time: 11/05/16  1114 By signing my name below, I, Georgette Shell, attest that this documentation has been prepared under the direction and in the presence of [employer], PA-C. Electronically Signed: Georgette Shell, ED Scribe. 11/05/16. 1:01 PM.  History   Chief Complaint Chief Complaint  Patient presents with  . Back Pain    HPI The history is provided by the patient. No language interpreter was used.   HPI Comments: Calvin [name] is a 56 y.o. male with h/o arthritis, disc disorder, lumbar herniated disc, and HTN, who presents to the Emergency Department complaining of acute on chronic lower back pain radiating down BLEs worsening three days ago. Pt denies any recent injury or trauma. Pain is exacerbated with ambulating. Pt has taken BC powder with mild relief. Has h/o chronic lower back pain but states this feels worse. He often has exacerbations of his chronic pain. Pt reports he is suppose to have surgery for this lower back but is waiting for his insurance. Pt is normally ambulatory with a cane. Pt denies fever, chills, urinary symptoms, or any other associated symptoms.   Past Medical History:  Diagnosis Date  . Arthritis   . Disc disorder   . Hypertension   . Lumbar herniated disc     Patient Active Problem List   Diagnosis Date Noted  . Hallux valgus 08/03/2016  . Disc disorder   . Lumbar facet arthropathy 08/06/2014  . Lumbar radiculopathy 08/06/2014  . ED (erectile dysfunction) 01/07/2014  . Unspecified vitamin D deficiency 01/07/2014  . Sciatica of left side 08/10/2013  . Left shoulder pain 08/10/2013  . Chronic low back pain 08/10/2013  . Essential hypertension, benign 08/10/2013  . Smoking 08/10/2013    Past Surgical History:  Procedure Laterality Date  . FOOT SURGERY     right, ~ 2002     Home Medications    Prior to Admission medications   Medication Sig Start Date End Date Taking? Authorizing  Provider  acetaminophen (TYLENOL) 500 MG tablet Take 1,000 mg by mouth every 6 (six) hours as needed for headache.    Historical Provider, MD  amLODipine (NORVASC) 10 MG tablet Take 1 tablet (10 mg total) by mouth daily. 07/06/16   Maren Reamer, MD  Aspirin-Salicylamide-Caffeine (BC FAST PAIN RELIEF) [id number] MG PACK Take 1 Package by mouth every 6 (six) hours as needed (headaches).    Historical Provider, MD  cyclobenzaprine (FLEXERIL) 10 MG tablet Take 1 tablet (10 mg total) by mouth 2 (two) times daily as needed for muscle spasms. 03/15/16   Maren Reamer, MD  hydrochlorothiazide (HYDRODIURIL) 25 MG tablet Take 1 tablet (25 mg total) by mouth daily. 07/06/16   Maren Reamer, MD  nicotine (NICODERM CQ) 7 mg/24hr patch Place 1 patch (7 mg total) onto the skin daily. 07/06/16   Maren Reamer, MD  predniSONE (DELTASONE) 20 MG tablet Take 2 tablets (40 mg total) by mouth daily. 11/05/16   Okey Regal, PA-C  traMADol (ULTRAM) 50 MG tablet Take 1 tablet (50 mg total) by mouth every 6 (six) hours as needed. 11/05/16   Wellington Winegarden, PA-C  VIAGRA 100 MG tablet TAKE 1 TABLET BY MOUTH DAILY AS NEEDED FOR ERECTILE DYSFUNCTION. 03/16/16   Maren Reamer, MD    Family History Family History  Problem Relation Age of Onset  . Diabetes Mother   . Hypertension Mother   . Heart disease Mother   . Hyperlipidemia  Mother   . Diabetes Brother   . Diabetes Daughter   . Hypertension Sister   . Diabetes Sister     Social History Social History  Substance Use Topics  . Smoking status: Current Every Day Smoker    Packs/day: 0.10    Years: 20.00    Types: Cigarettes  . Smokeless tobacco: Never Used  . Alcohol use Yes     Comment: rarely     Allergies   Patient has no known allergies.   Review of Systems Review of Systems  Constitutional: Negative for chills and fatigue.  Gastrointestinal: Negative for blood in stool.  Genitourinary: Negative for difficulty urinating.    Musculoskeletal: Positive for back pain.  All other systems reviewed and are negative.   Physical Exam Updated Vital Signs BP (!) 120/92 (BP Location: Right Arm)   Pulse 80   Temp 98 F (36.7 C) (Oral)   Resp 16   SpO2 97%   Physical Exam  Constitutional: He appears well-developed and well-nourished.  HENT:  Head: Normocephalic.  Eyes: Conjunctivae are normal.  Cardiovascular: Normal rate.   Pulmonary/Chest: Effort normal. No respiratory distress.  Abdominal: Soft. Bowel sounds are normal. He exhibits no distension. There is no tenderness. There is no rebound and no guarding.  Musculoskeletal: Normal range of motion. He exhibits tenderness.  Tenderness to palpation of lower lumbar region and upper bilateral gluteus. No C, T, or L-spine tenderness. Decreased sensation to BLEs. Patellar reflexes are 2+.  Neurological: He is alert.  Skin: Skin is warm and dry.  Psychiatric: He has a normal mood and affect. His behavior is normal.  Nursing note and vitals reviewed.    ED Treatments / Results  DIAGNOSTIC STUDIES: Oxygen Saturation is 100% on RA, normal by my interpretation.   COORDINATION OF CARE: 1:01 PM-Discussed next steps with pt. Pt verbalized understanding and is agreeable with the plan.   Labs (all labs ordered are listed, but only abnormal results are displayed) Labs Reviewed - No data to display  EKG  EKG Interpretation None       Radiology No results found.  Procedures Procedures (including critical care time)  Medications Ordered in ED Medications - No data to display   Initial Impression / Assessment and Plan / ED Course  I have reviewed the triage vital signs and the nursing notes.  Pertinent labs & imaging results that were available during my care of the patient were reviewed by me and considered in my medical decision making (see chart for details).      Final Clinical Impressions(s) / ED Diagnoses   Final diagnoses:  Acute bilateral  low back pain with bilateral sciatica    56 year old male with a history of chronic back pain with significant past workup presents today with acute on chronic back pain.  Patient has distal neurological deficits that are baseline for him.  He has no significant changes in these.  Patient is followed by Dr. Barbaraann Barthel of sports medicine, has had MRIs and neurosurgical evaluation.  Patient has had improvement in symptoms with Ultram and prednisone in the past, he will be started on this with close follow-up with Dr. Barbaraann Barthel for reevaluation and ongoing management.  He is given strict return precautions, verbalized understanding and agreement to today's plan had no further questions or concerns at the time of discharge  New Prescriptions New Prescriptions   PREDNISONE (DELTASONE) 20 MG TABLET    Take 2 tablets (40 mg total) by mouth daily.   TRAMADOL Veatrice Bourbon)  50 MG TABLET    Take 1 tablet (50 mg total) by mouth every 6 (six) hours as needed.   I personally performed the services described in this documentation, which was scribed in my presence. The recorded information has been reviewed and is accurate.    Okey Regal, PA-C 11/05/16 [place], MD 11/06/16 [phone]

## 2016-11-05 NOTE — ED Triage Notes (Signed)
Pt verbalizes acute chronic lower back pain onset today; denies injury.

## 2016-11-08 ENCOUNTER — Other Ambulatory Visit: Payer: Self-pay | Admitting: Internal Medicine

## 2016-11-08 ENCOUNTER — Telehealth: Payer: Self-pay | Admitting: Internal Medicine

## 2016-11-08 DIAGNOSIS — M5416 Radiculopathy, lumbar region: Secondary | ICD-10-CM

## 2016-11-08 NOTE — Telephone Encounter (Signed)
Patient called the office to request medication refill for cyclobenzaprine (FLEXERIL) 10 MG tablet    Thank you.

## 2016-11-09 MED FILL — ?CYCLOBENZAPRINE 10 MG TABL: 10 | 30 days supply | Qty: 60 | Fill #0

## 2016-11-09 NOTE — Telephone Encounter (Signed)
Cyclobenzaprine refilled.

## 2016-11-12 MED FILL — ?PREDNISONE 20 MG TABLET: 20 | 5 days supply | Qty: 10 | Fill #0

## 2016-11-26 MED FILL — $VIAGRA 100 MG TABLET: 100 | 30 days supply | Qty: 10 | Fill #7

## 2016-12-07 ENCOUNTER — Ambulatory Visit: Payer: Self-pay | Admitting: Internal Medicine

## 2016-12-09 ENCOUNTER — Encounter: Payer: Self-pay | Admitting: Internal Medicine

## 2016-12-10 ENCOUNTER — Other Ambulatory Visit: Payer: Self-pay | Admitting: Internal Medicine

## 2016-12-10 ENCOUNTER — Encounter: Payer: Self-pay | Admitting: Internal Medicine

## 2016-12-10 DIAGNOSIS — M5416 Radiculopathy, lumbar region: Secondary | ICD-10-CM

## 2016-12-13 ENCOUNTER — Encounter: Payer: Self-pay | Admitting: Internal Medicine

## 2016-12-13 ENCOUNTER — Ambulatory Visit: Payer: Self-pay | Admitting: Internal Medicine

## 2016-12-24 ENCOUNTER — Ambulatory Visit: Payer: Self-pay | Attending: Internal Medicine | Admitting: Internal Medicine

## 2016-12-24 ENCOUNTER — Encounter: Payer: Self-pay | Admitting: Internal Medicine

## 2016-12-24 VITALS — BP 120/90 | HR 106 | Temp 98.1°F | Resp 16 | Wt 158.4 lb

## 2016-12-24 DIAGNOSIS — Z1211 Encounter for screening for malignant neoplasm of colon: Secondary | ICD-10-CM

## 2016-12-24 DIAGNOSIS — M545 Low back pain: Secondary | ICD-10-CM | POA: Insufficient documentation

## 2016-12-24 DIAGNOSIS — R4589 Other symptoms and signs involving emotional state: Secondary | ICD-10-CM

## 2016-12-24 DIAGNOSIS — Z720 Tobacco use: Secondary | ICD-10-CM

## 2016-12-24 DIAGNOSIS — I10 Essential (primary) hypertension: Secondary | ICD-10-CM | POA: Insufficient documentation

## 2016-12-24 DIAGNOSIS — F1910 Other psychoactive substance abuse, uncomplicated: Secondary | ICD-10-CM

## 2016-12-24 DIAGNOSIS — M4696 Unspecified inflammatory spondylopathy, lumbar region: Secondary | ICD-10-CM | POA: Insufficient documentation

## 2016-12-24 DIAGNOSIS — M5432 Sciatica, left side: Secondary | ICD-10-CM | POA: Insufficient documentation

## 2016-12-24 DIAGNOSIS — N529 Male erectile dysfunction, unspecified: Secondary | ICD-10-CM | POA: Insufficient documentation

## 2016-12-24 DIAGNOSIS — F17210 Nicotine dependence, cigarettes, uncomplicated: Secondary | ICD-10-CM | POA: Insufficient documentation

## 2016-12-24 DIAGNOSIS — R531 Weakness: Secondary | ICD-10-CM | POA: Insufficient documentation

## 2016-12-24 DIAGNOSIS — Z7982 Long term (current) use of aspirin: Secondary | ICD-10-CM | POA: Insufficient documentation

## 2016-12-24 DIAGNOSIS — M5416 Radiculopathy, lumbar region: Secondary | ICD-10-CM | POA: Insufficient documentation

## 2016-12-24 DIAGNOSIS — F329 Major depressive disorder, single episode, unspecified: Secondary | ICD-10-CM

## 2016-12-24 DIAGNOSIS — M2010 Hallux valgus (acquired), unspecified foot: Secondary | ICD-10-CM | POA: Insufficient documentation

## 2016-12-24 DIAGNOSIS — Z79899 Other long term (current) drug therapy: Secondary | ICD-10-CM | POA: Insufficient documentation

## 2016-12-24 DIAGNOSIS — G8929 Other chronic pain: Secondary | ICD-10-CM | POA: Insufficient documentation

## 2016-12-24 MED ORDER — DULOXETINE HCL 30 MG PO CPEP: 30.0000 mg | ORAL_CAPSULE | Freq: Every day | ORAL | 3 refills | Status: DC

## 2016-12-24 MED ORDER — DICLOFENAC SODIUM 1 % TD GEL: 2.0000 g | Freq: Four times a day (QID) | TRANSDERMAL | 3 refills | Status: DC

## 2016-12-24 MED ORDER — CYCLOBENZAPRINE HCL 5 MG PO TABS
5.0000 mg | ORAL_TABLET | Freq: Two times a day (BID) | ORAL | 1 refills | Status: DC | PRN
Start: 2016-12-24 — End: 2017-08-18

## 2016-12-24 MED FILL — CYCLOBENZAPRINE 5 MG TABLET: 5 | 20 days supply | Qty: 40 | Fill #0

## 2016-12-24 MED FILL — ?DULOXETINE HCL DR 30 MG CA: 30 MG | 30 days supply | Qty: 30 | Fill #0

## 2016-12-24 MED FILL — VOLTAREN 1% GEL: 1 | 12 days supply | Qty: 100 | Fill #0

## 2016-12-24 NOTE — Patient Instructions (Signed)
You have been referred for colonoscopy.  You will be called with the appointment.  Continue to work on cutting back on smoking.  Try to follow through on going to the rehab program in [place].  Start Voltaren Gel and Flexeril as needed.  Start Cymbalta as discussed to help with chronic pain and depressed mood.

## 2016-12-24 NOTE — Progress Notes (Signed)
Patient ID: Calvin [name], male    DOB: 1961-05-19  MRN: [id number]  CC: Establish Care and Back Pain   Subjective: Calvin [name] is a 56 y.o. male who presents to est care with me as PCP. His concerns today include:  Pt with hx of HTN, chronic LBP, ED, tobacco  1.  LBP -lower back and goes down both legs but more LT leg.  Weakness in LT leg.  -pain prevents him from standing or walking for more than 10 mins. -+ numbness and coolness in both feet. -plans to make f/u appt with Dr. Ernestina Patches (ortho) and Lorin Mercy (neurosurgeon) -using [facility] Powder 3 x a day.  Helps a little.  -mood depressed most of the time because he is not able to do as much he would like including return to work. Has to rely on others financially.  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   2.  +UDS 06/2016 -states he uses street drugs mainly cocaine intermittently.  Last used 3 days ago -will be meeting with some one from [facility] in [place] (in pt rehab detox then referred to long term).  Did detox in fall last yr.   -denies ETOH use  3.  Tob dep:  Down to 3 cigarettes a day from a whole pk.    4.  HTN: compliant with HCTZ and Norvasc -limits salt in foods -no CP/SOB/LE edema -HA sometimes    Patient Active Problem List   Diagnosis Date Noted  . Hallux valgus 08/03/2016  . Disc disorder   . Lumbar facet arthropathy ([place]) 08/06/2014  . Lumbar radiculopathy 08/06/2014  . ED (erectile dysfunction) 01/07/2014  . Unspecified vitamin D deficiency 01/07/2014  . Sciatica of left side 08/10/2013  . Left shoulder pain 08/10/2013  . Chronic low back pain 08/10/2013  . Essential hypertension, benign 08/10/2013  . Smoking 08/10/2013     Current Outpatient Prescriptions on File Prior to Visit  Medication Sig Dispense Refill  . amLODipine (NORVASC) 10 MG tablet Take 1 tablet (10 mg total) by mouth daily. 90 tablet 3  . Aspirin-Salicylamide-Caffeine (BC FAST PAIN RELIEF) [id number]  MG PACK Take 1 Package by mouth every 6 (six) hours as needed (headaches).    . hydrochlorothiazide (HYDRODIURIL) 25 MG tablet Take 1 tablet (25 mg total) by mouth daily. 90 tablet 3  . VIAGRA 100 MG tablet TAKE 1 TABLET BY MOUTH DAILY AS NEEDED FOR ERECTILE DYSFUNCTION. 30 tablet 2   No current facility-administered medications on file prior to visit.     No Known Allergies  Social History   Social History  . Marital status: Single    Spouse name: N/A  . Number of children: N/A  . Years of education: N/A   Occupational History  . Not on file.   Social History Main Topics  . Smoking status: Current Every Day Smoker    Packs/day: 0.10    Years: 20.00    Types: Cigarettes  . Smokeless tobacco: Never Used  . Alcohol use Yes     Comment: rarely  . Drug use: No  . Sexual activity: Not on file   Other Topics Concern  . Not on file   Social History Narrative  . No narrative on file    Family History  Problem Relation Age of Onset  . Diabetes Mother   . Hypertension Mother   . Heart disease Mother   . Hyperlipidemia Mother   . Diabetes Brother   .  Diabetes Daughter   . Hypertension Sister   . Diabetes Sister     Past Surgical History:  Procedure Laterality Date  . FOOT SURGERY     right, ~ 2002    ROS: Review of Systems As stated above PHYSICAL EXAM: BP (!) 131/95   Pulse (!) 106   Temp 98.1 F (36.7 C) (Oral)   Resp 16   Wt 158 lb 6.4 oz (71.8 kg)   SpO2 95%   BMI 20.90 kg/m   120/90 on repeat Physical Exam  General appearance - alert, well appearing, older male and in no distress Mental status - normal mood, behavior, speech, dress, motor activity, and thought processes Mouth - mucous membranes moist, pharynx normal without lesions Neck - supple, no significant adenopathy Chest - clear to auscultation, no wheezes, rales or rhonchi, symmetric air entry Heart - normal rate, regular rhythm, normal S1, S2, no murmurs, rubs, clicks or  gallops Musculoskeletal -mild tenderness on palpation of LS spine and surrounding paraspinal muscles.  Ambulates with cane.   Neuro:  Subjective decrease sensation to gross touch LT lower leg compared to RT. Power 5/5 RT LE and 4+/5 RT Extremities - no Le edema  Depression screen [facility] 2/9 12/24/2016  Decreased Interest 2  Down, Depressed, Hopeless 2  PHQ - 2 Score 4  Altered sleeping 2  Tired, decreased energy 2  Change in appetite 2  Feeling bad or failure about yourself  2  Trouble concentrating 2  Moving slowly or fidgety/restless 1  Suicidal thoughts 1  PHQ-9 Score 16  Difficult doing work/chores -   GAD 7 : Generalized Anxiety Score 12/24/2016 07/06/2016 02/02/2016 12/01/2015  Nervous, Anxious, on Edge 2 3 2 3   Control/stop worrying 2 3 2 2   Worry too much - different things 2 3 3 3   Trouble relaxing 3 3 3 3   Restless 2 2 2 3   Easily annoyed or irritable 3 2 2 2   Afraid - awful might happen 3 2 - 3  Total GAD 7 Score 17 18 - 19  Anxiety Difficulty - - Not difficult at all -     ASSESSMENT AND PLAN: 1. Lumbar radiculopathy -not good candidate for chronic narcotics.  Pt agreeable to stretching exercises, Voltaren gel, Flexeril and Cymbalta - cyclobenzaprine (FLEXERIL) 5 MG tablet; Take 1 tablet (5 mg total) by mouth 2 (two) times daily as needed for muscle spasms.  Dispense: 40 tablet; Refill: 1 - diclofenac sodium (VOLTAREN) 1 % GEL; Apply 2 g topically 4 (four) times daily.  Dispense: 100 g; Refill: 3 - DULoxetine (CYMBALTA) 30 MG capsule; Take 1 capsule (30 mg total) by mouth daily.  Dispense: 30 capsule; Refill: 3  2. Hypertension, unspecified type -at goal - Comprehensive metabolic panel - CBC - Lipid panel  3. Tobacco abuse -continue to cut back.  Unable to afford nicotine patches  4. Substance abuse -encouraged him to follow through with [facility]  5. Colon cancer screening - Ambulatory referral to Gastroenterology  6. Depressed mood -pt agreeable to Cymbalta for  depress mood and chronic pain.  F/u if increase depression - DULoxetine (CYMBALTA) 30 MG capsule; Take 1 capsule (30 mg total) by mouth daily.  Dispense: 30 capsule; Refill: 3  Patient was given the opportunity to ask questions.  Patient verbalized understanding of the plan and was able to repeat key elements of the plan.   Orders Placed This Encounter  Procedures  . Comprehensive metabolic panel  . CBC  . Lipid panel  .  Ambulatory referral to Gastroenterology     Requested Prescriptions   Signed Prescriptions Disp Refills  . cyclobenzaprine (FLEXERIL) 5 MG tablet 40 tablet 1    Sig: Take 1 tablet (5 mg total) by mouth 2 (two) times daily as needed for muscle spasms.  . diclofenac sodium (VOLTAREN) 1 % GEL 100 g 3    Sig: Apply 2 g topically 4 (four) times daily.  . DULoxetine (CYMBALTA) 30 MG capsule 30 capsule 3    Sig: Take 1 capsule (30 mg total) by mouth daily.    Return in about 4 months (around 04/25/2017).  Karle Plumber, MD, FACP

## 2016-12-25 ENCOUNTER — Telehealth: Payer: Self-pay | Admitting: Internal Medicine

## 2016-12-25 DIAGNOSIS — E875 Hyperkalemia: Secondary | ICD-10-CM

## 2016-12-25 LAB — LIPID PANEL
CHOL/HDL RATIO: 3.9 ratio (ref 0.0–5.0)
Cholesterol, Total: 158 mg/dL (ref 100–199)
HDL: 41 mg/dL (ref >39)
LDL Calculated: 101 mg/dL — ABNORMAL HIGH (ref 0–99)
TRIGLYCERIDES: 79 mg/dL (ref 0–149)
VLDL Cholesterol Cal: 16 mg/dL (ref 5–40)

## 2016-12-25 LAB — COMPREHENSIVE METABOLIC PANEL
A/G RATIO: 1.2 (ref 1.2–2.2)
ALK PHOS: 109 IU/L (ref 39–117)
ALT: 12 IU/L (ref 0–44)
AST: 19 IU/L (ref 0–40)
Albumin: 4.3 g/dL (ref 3.5–5.5)
BILIRUBIN TOTAL: 0.4 mg/dL (ref 0.0–1.2)
BUN/Creatinine Ratio: 10 (ref 9–20)
BUN: 10 mg/dL (ref 6–24)
CALCIUM: 9.6 mg/dL (ref 8.7–10.2)
CHLORIDE: 102 mmol/L (ref 96–106)
CO2: 23 mmol/L (ref 18–29)
Creatinine, Ser: 1.05 mg/dL (ref 0.76–1.27)
GFR calc Af Amer: 92 mL/min/{1.73_m2} (ref >59)
GFR calc non Af Amer: 80 mL/min/{1.73_m2} (ref >59)
Globulin, Total: 3.7 g/dL (ref 1.5–4.5)
Glucose: 97 mg/dL (ref 65–99)
POTASSIUM: 5.4 mmol/L — AB (ref 3.5–5.2)
Sodium: 139 mmol/L (ref 134–144)
Total Protein: 8 g/dL (ref 6.0–8.5)

## 2016-12-25 LAB — CBC
HEMOGLOBIN: 14.7 g/dL (ref 13.0–17.7)
Hematocrit: 46 % (ref 37.5–51.0)
MCH: 27.6 pg (ref 26.6–33.0)
MCHC: 32 g/dL (ref 31.5–35.7)
MCV: 86 fL (ref 79–97)
Platelets: 293 10*3/uL (ref 150–379)
RBC: 5.33 x10E6/uL (ref 4.14–5.80)
RDW: 14 % (ref 12.3–15.4)
WBC: 5.5 10*3/uL (ref 3.4–10.8)

## 2016-12-25 NOTE — Telephone Encounter (Signed)
PC placed to pt today to discuss lab results.  Cell phone with recording that user is unable to accept calls right now.  PC then made to home #.  Pt's sister answered stating pt does not live there but can get message to him.  I told her to have him called the office on mOnday to get lab results.  Note: pt with elevated potassium on recent labs. Will need repeat level.  Can come as lab only visit. Message sent to [place]. Results for orders placed or performed in visit on 12/24/16  Comprehensive metabolic panel  Result Value Ref Range   Glucose 97 65 - 99 mg/dL   BUN 10 6 - 24 mg/dL   Creatinine, Ser 1.05 0.76 - 1.27 mg/dL   GFR calc non Af Amer 80 >59 mL/min/1.73   GFR calc Af Amer 92 >59 mL/min/1.73   BUN/Creatinine Ratio 10 9 - 20   Sodium 139 134 - 144 mmol/L   Potassium 5.4 (H) 3.5 - 5.2 mmol/L   Chloride 102 96 - 106 mmol/L   CO2 23 18 - 29 mmol/L   Calcium 9.6 8.7 - 10.2 mg/dL   Total Protein 8.0 6.0 - 8.5 g/dL   Albumin 4.3 3.5 - 5.5 g/dL   Globulin, Total 3.7 1.5 - 4.5 g/dL   Albumin/Globulin Ratio 1.2 1.2 - 2.2   Bilirubin Total 0.4 0.0 - 1.2 mg/dL   Alkaline Phosphatase 109 39 - 117 IU/L   AST 19 0 - 40 IU/L   ALT 12 0 - 44 IU/L  CBC  Result Value Ref Range   WBC 5.5 3.4 - 10.8 x10E3/uL   RBC 5.33 4.14 - 5.80 x10E6/uL   Hemoglobin 14.7 13.0 - 17.7 g/dL   Hematocrit 46.0 37.5 - 51.0 %   MCV 86 79 - 97 fL   MCH 27.6 26.6 - 33.0 pg   MCHC 32.0 31.5 - 35.7 g/dL   RDW 14.0 12.3 - 15.4 %   Platelets 293 150 - 379 x10E3/uL  Lipid panel  Result Value Ref Range   Cholesterol, Total 158 100 - 199 mg/dL   Triglycerides 79 0 - 149 mg/dL   HDL 41 >39 mg/dL   VLDL Cholesterol Cal 16 5 - 40 mg/dL   LDL Calculated 101 (H) 0 - 99 mg/dL   Chol/HDL Ratio 3.9 0.0 - 5.0 ratio

## 2016-12-27 ENCOUNTER — Telehealth: Payer: Self-pay

## 2016-12-27 ENCOUNTER — Encounter: Payer: Self-pay | Admitting: Gastroenterology

## 2016-12-27 MED FILL — AMLODIPINE BESYLATE 10 MG T: 10 | 30 days supply | Qty: 30 | Fill #2

## 2016-12-27 MED FILL — HYDROCHLOROTHIAZIDE 25 MG T: 25 | 30 days supply | Qty: 30 | Fill #2

## 2016-12-27 NOTE — Telephone Encounter (Signed)
Contacted pt to go over lab results pt didn't answer was unable to lvm  If pt calls back please give results: blood count, kidney and liver function tests and cholesterol level are okay. His potassium level is elevated. High potassium can cause heart to go into abnormal rhythm. I would like to repeat level asap. Can come as lab only visit. I have entered the order.

## 2016-12-29 ENCOUNTER — Telehealth: Payer: Self-pay | Admitting: Internal Medicine

## 2016-12-29 DIAGNOSIS — N529 Male erectile dysfunction, unspecified: Secondary | ICD-10-CM

## 2016-12-29 MED ORDER — SILDENAFIL CITRATE 100 MG PO TABS: ORAL_TABLET | ORAL | 2 refills | Status: DC

## 2016-12-29 NOTE — Telephone Encounter (Signed)
RF on Viagra sent to [facility].

## 2016-12-29 NOTE — Telephone Encounter (Signed)
Pt.called requesting a refill on Viagra. Please f/u

## 2016-12-30 MED FILL — $VIAGRA 100 MG TABLET: 100 | 30 days supply | Qty: 2 | Fill #0

## 2017-02-14 MED FILL — AMLODIPINE BESYLATE 10 MG T: 10 | 30 days supply | Qty: 30 | Fill #3

## 2017-02-14 MED FILL — HYDROCHLOROTHIAZIDE 25 MG T: 25 | 30 days supply | Qty: 30 | Fill #3

## 2017-02-14 MED FILL — $VIAGRA 100 MG TABLET: 100 | 30 days supply | Qty: 2 | Fill #1

## 2017-02-17 ENCOUNTER — Ambulatory Visit (AMBULATORY_SURGERY_CENTER): Payer: Self-pay | Admitting: *Deleted

## 2017-02-17 VITALS — Ht 72.0 in | Wt 151.4 lb

## 2017-02-17 DIAGNOSIS — Z1211 Encounter for screening for malignant neoplasm of colon: Secondary | ICD-10-CM

## 2017-02-17 MED ORDER — MOVIPREP 100 G PO SOLR: 1.0000 | Freq: Once | ORAL | 0 refills | Status: AC

## 2017-02-17 NOTE — Progress Notes (Signed)
Telephone call to Dr. Fuller Plan to discuss [facility] orange card. We have no suprep samples. Per Dr. Fuller Plan okay to use Moviprep sample.

## 2017-02-17 NOTE — Progress Notes (Signed)
Denies allergies to eggs or soy products. Denies complications with sedation or anesthesia. Denies O2 use. Denies use of diet or weight loss medications.  Emmi instructions not given for colonoscopy.Pt does not have Internet.

## 2017-02-25 ENCOUNTER — Encounter: Payer: Self-pay | Admitting: Gastroenterology

## 2017-02-25 ENCOUNTER — Ambulatory Visit (AMBULATORY_SURGERY_CENTER): Payer: Self-pay | Admitting: Gastroenterology

## 2017-02-25 ENCOUNTER — Telehealth: Payer: Self-pay | Admitting: *Deleted

## 2017-02-25 ENCOUNTER — Telehealth: Payer: Self-pay

## 2017-02-25 VITALS — BP 103/68 | HR 72 | Temp 98.6°F | Resp 10 | Ht 72.0 in | Wt 151.0 lb

## 2017-02-25 DIAGNOSIS — Z1211 Encounter for screening for malignant neoplasm of colon: Secondary | ICD-10-CM

## 2017-02-25 DIAGNOSIS — D124 Benign neoplasm of descending colon: Secondary | ICD-10-CM

## 2017-02-25 DIAGNOSIS — Z1212 Encounter for screening for malignant neoplasm of rectum: Secondary | ICD-10-CM

## 2017-02-25 DIAGNOSIS — Z538 Procedure and treatment not carried out for other reasons: Secondary | ICD-10-CM

## 2017-02-25 MED ORDER — SODIUM CHLORIDE 0.9 % IV SOLN: 500.0000 mL | INTRAVENOUS | Status: DC

## 2017-02-25 NOTE — Progress Notes (Signed)
  [facility] Post-op Note  Patient: Calvin [name]  Procedure(s) Performed: colonoscopy  Patient Location: [facility] - Recovery Area  Anesthesia Type: Deep Sedation/Propofol  Level of Consciousness: oriented, sedated and patient cooperative  Airway and Oxygen Therapy: Patient Spontanous Breathing  Post-op Pain: none  Post-op Assessment:  Post-op Vital signs reviewed, Patient's Cardiovascular Status Stable, Respiratory Function Stable, Patent Airway, No signs of Nausea or vomiting and Pain level controlled  Post-op Vital Signs: Reviewed and stable  Complications: No apparent anesthesia complications  Virtie Bungert E Merridith Dershem 12:09 PM

## 2017-02-25 NOTE — Op Note (Signed)
[facility] Patient Name: Calvin [name] Procedure Date: 02/25/2017 11:31 AM MRN: [id number] Endoscopist: Ladene Artist , MD Age: 56 Referring MD:  Date of Birth: 08-12-1960 Gender: Male Account #: [id number] Procedure:                Colonoscopy Indications:              Screening for colorectal malignant neoplasm Medicines:                Monitored Anesthesia Care Procedure:                Pre-Anesthesia Assessment:                           - Prior to the procedure, a History and Physical                            was performed, and patient medications and                            allergies were reviewed. The patient's tolerance of                            previous anesthesia was also reviewed. The risks                            and benefits of the procedure and the sedation                            options and risks were discussed with the patient.                            All questions were answered, and informed consent                            was obtained. Prior Anticoagulants: The patient has                            taken no previous anticoagulant or antiplatelet                            agents. ASA Grade Assessment: II - A patient with                            mild systemic disease. After reviewing the risks                            and benefits, the patient was deemed in                            satisfactory condition to undergo the procedure.                           After obtaining informed consent, the colonoscope  was passed under direct vision. Throughout the                            procedure, the patient's blood pressure, pulse, and                            oxygen saturations were monitored continuously. The                            Colonoscope was introduced through the anus and                            advanced to the the terminal ileum, with                            identification of the  appendiceal orifice and IC                            valve. The ileocecal valve, appendiceal orifice,                            and rectum were photographed. The quality of the                            bowel preparation was unsatisfactory. Copious                            lavage and extensive suctioning could not clear the                            colon adequately. The colonoscopy was performed                            without difficulty. The patient tolerated the                            procedure well. Scope In: 11:36:59 AM Scope Out: 12:02:43 PM Scope Withdrawal Time: 0 hours 18 minutes 7 seconds  Total Procedure Duration: 0 hours 25 minutes 44 seconds  Findings:                 The perianal and digital rectal examinations were                            normal.                           A 7 mm polyp was found in the descending colon. The                            polyp was sessile. The polyp was removed with a                            cold snare. Resection and retrieval were complete.  A large amount of stool was found in the rectum, in                            the sigmoid colon, in the descending colon, at the                            splenic flexure, in the transverse colon, at the                            hepatic flexure, in the ascending colon and in the                            cecum, interfering with visualization. Lavage of                            the area was performed using copious amounts of tap                            water, resulting in incomplete clearance with                            continued poor visualization.                           The exam was otherwise without abnormality on                            direct and retroflexion views. Complications:            No immediate complications. Estimated blood loss:                            None. Estimated Blood Loss:     Estimated blood loss: none. Impression:                - Preparation of the colon was unsatisfactory.                           - One 7 mm polyp in the descending colon, removed                            with a cold snare. Resected and retrieved.                           - Stool in the rectum, in the sigmoid colon, in the                            descending colon, at the splenic flexure, in the                            transverse colon, at the hepatic flexure, in the                            ascending colon  and in the cecum.                           - The examination was otherwise normal on direct                            and retroflexion views. Recommendation:           - Repeat colonoscopy at the next available                            appointment because the bowel preparation was poor                            with an extended bowel prep.                           - Patient has a contact number available for                            emergencies. The signs and symptoms of potential                            delayed complications were discussed with the                            patient. Return to normal activities tomorrow.                            Written discharge instructions were provided to the                            patient.                           - Resume previous diet.                           - Continue present medications.                           - Await pathology results. Ladene Artist, MD 02/25/2017 12:14:28 PM This report has been signed electronically.

## 2017-02-25 NOTE — Telephone Encounter (Signed)
Called pt. To schedule recall colonoscopy because prep for today was not satisfactory.  Left message To call office.

## 2017-02-25 NOTE — Progress Notes (Signed)
Called pt. To schedule repeat colonoscopy.  Unable to reach.  Will try again. Left message to call office for Rescheduling of appointment.

## 2017-02-25 NOTE — Patient Instructions (Signed)
YOU HAD AN ENDOSCOPIC PROCEDURE TODAY AT [facility]:   Refer to the procedure report that was given to you for any specific questions about what was found during the examination.  If the procedure report does not answer your questions, please call your gastroenterologist to clarify.  If you requested that your care partner not be given the details of your procedure findings, then the procedure report has been included in a sealed envelope for you to review at your convenience later.  YOU SHOULD EXPECT: Some feelings of bloating in the abdomen. Passage of more gas than usual.  Walking can help get rid of the air that was put into your GI tract during the procedure and reduce the bloating. If you had a lower endoscopy (such as a colonoscopy or flexible sigmoidoscopy) you may notice spotting of blood in your stool or on the toilet paper. If you underwent a bowel prep for your procedure, you may not have a normal bowel movement for a few days.  Please Note:  You might notice some irritation and congestion in your nose or some drainage.  This is from the oxygen used during your procedure.  There is no need for concern and it should clear up in a day or so.  SYMPTOMS TO REPORT IMMEDIATELY:   Following lower endoscopy (colonoscopy or flexible sigmoidoscopy):  Excessive amounts of blood in the stool  Significant tenderness or worsening of abdominal pains  Swelling of the abdomen that is new, acute  Fever of 100F or higher   For urgent or emergent issues, a gastroenterologist can be reached at any hour by calling [phone].   DIET:  We do recommend a small meal at first, but then you may proceed to your regular diet.  Drink plenty of fluids but you should avoid alcoholic beverages for 24 hours.  ACTIVITY:  You should plan to take it easy for the rest of today and you should NOT DRIVE or use heavy machinery until tomorrow (because of the sedation medicines used during the test).     FOLLOW UP: Our staff will call the number listed on your records the next business day following your procedure to check on you and address any questions or concerns that you may have regarding the information given to you following your procedure. If we do not reach you, we will leave a message.  However, if you are feeling well and you are not experiencing any problems, there is no need to return our call.  We will assume that you have returned to your regular daily activities without incident.  If any biopsies were taken you will be contacted by phone or by letter within the next 1-3 weeks.  Please call us at [phone] if you have not heard about the biopsies in 3 weeks.    SIGNATURES/CONFIDENTIALITY: You and/or your care partner have signed paperwork which will be entered into your electronic medical record.  These signatures attest to the fact that that the information above on your After Visit Summary has been reviewed and is understood.  Full responsibility of the confidentiality of this discharge information lies with you and/or your care-partner.   Polyp information given.  Will need repeat colonoscopy.

## 2017-02-25 NOTE — Telephone Encounter (Signed)
Last night Calvin [name] called Dr. Loletha Carrow who was on call last night. Dr. Loletha Carrow tried to call the patient back two times with the numbers that the patient made the call from. He was unable to reach the patient. I tried to call this morning and was able to reach Calvin [name] sister who is going to be his driver today. The sister told me that his phone card ran out of minutes so he was unable to receive calls. The patient's sister said that as far as she knew he was still planning to come as planned. I reassured the patient's sister that every attempt was made to get in touch with Calvin [name].   Riki Sheer, LPN

## 2017-02-28 ENCOUNTER — Telehealth: Payer: Self-pay

## 2017-02-28 NOTE — Telephone Encounter (Signed)
Called #[phone] and left a messaged we tried to reach pt for a follow up call. maw

## 2017-03-01 ENCOUNTER — Telehealth: Payer: Self-pay | Admitting: *Deleted

## 2017-03-01 NOTE — Telephone Encounter (Signed)
  Follow up Call-  Call back number 02/25/2017  Post procedure Call Back phone  # [phone]  Permission to leave phone message Yes  Some recent data might be hidden    LMOM and also asked patient to call back to reschedule colonoscopy.  This is the third attempt that was made to reach pt to reschedule.

## 2017-03-01 NOTE — Telephone Encounter (Signed)
Pt returned phone call.  I explained that Dr. Fuller Plan would like to do a repeat colonoscopy with a two day prep and offered to reschedule procedure.  Pt states he will call back tomorrow to set up appointment.

## 2017-03-01 NOTE — Telephone Encounter (Signed)
Note forwarded to Adah Salvage, Hardin Negus and Elijah Birk that three attempts were made to reach pt.  If he does call back in, he will need a more extensive prep.

## 2017-03-06 ENCOUNTER — Encounter: Payer: Self-pay | Admitting: Gastroenterology

## 2017-03-17 ENCOUNTER — Encounter: Payer: Self-pay | Admitting: Gastroenterology

## 2017-04-13 MED FILL — !VIAGRA 100MG TABLET: 100 | 30 days supply | Qty: 5 | Fill #2

## 2017-04-19 ENCOUNTER — Other Ambulatory Visit: Payer: Self-pay

## 2017-04-19 DIAGNOSIS — N529 Male erectile dysfunction, unspecified: Secondary | ICD-10-CM

## 2017-04-19 MED ORDER — SILDENAFIL CITRATE 100 MG PO TABS: ORAL_TABLET | ORAL | 3 refills | Status: DC

## 2017-05-04 ENCOUNTER — Emergency Department (HOSPITAL_COMMUNITY)
Admission: EM | Admit: 2017-05-04 | Discharge: 2017-05-04 | Disposition: A | Discharge status: Home / Self Care | Payer: Self-pay | Attending: Emergency Medicine | Admitting: Emergency Medicine

## 2017-05-04 ENCOUNTER — Encounter (HOSPITAL_COMMUNITY): Payer: Self-pay

## 2017-05-04 DIAGNOSIS — I10 Essential (primary) hypertension: Secondary | ICD-10-CM | POA: Insufficient documentation

## 2017-05-04 DIAGNOSIS — Z79899 Other long term (current) drug therapy: Secondary | ICD-10-CM | POA: Insufficient documentation

## 2017-05-04 DIAGNOSIS — K0889 Other specified disorders of teeth and supporting structures: Secondary | ICD-10-CM | POA: Insufficient documentation

## 2017-05-04 DIAGNOSIS — F17210 Nicotine dependence, cigarettes, uncomplicated: Secondary | ICD-10-CM | POA: Insufficient documentation

## 2017-05-04 MED ORDER — PENICILLIN V POTASSIUM 500 MG PO TABS: 500.0000 mg | ORAL_TABLET | Freq: Three times a day (TID) | ORAL | 0 refills | Status: DC

## 2017-05-04 MED ORDER — TRAMADOL HCL 50 MG PO TABS: 50.0000 mg | ORAL_TABLET | Freq: Three times a day (TID) | ORAL | 0 refills | Status: DC | PRN

## 2017-05-04 MED FILL — AMLODIPINE BESYLATE 10 MG T: 10 | 30 days supply | Qty: 30 | Fill #4

## 2017-05-04 MED FILL — PENICILLIN VK 500 MG TABLET: 500 | 7 days supply | Qty: 21 | Fill #0

## 2017-05-04 MED FILL — traMADol HCL 50 MG TABS: 50 | 3 days supply | Qty: 9 | Fill #0

## 2017-05-04 MED FILL — HYDROCHLOROTHIAZIDE 25 MG T: 25 | 30 days supply | Qty: 30 | Fill #4

## 2017-05-04 NOTE — ED Triage Notes (Signed)
Patient complains of left upper broken tooth for a few days with gum swelling and pain, NAD

## 2017-05-04 NOTE — ED Provider Notes (Signed)
[facility] DEPT Provider Note   CSN: [id number] Arrival date & time: 05/04/17  0917     History   Chief Complaint No chief complaint on file.   HPI Calvin [name] is a 56 y.o. male.  HPI   56 year old male with left facial/dental pain. Onset yesterday. Denies trauma. Left upper molar is hurting him. He said he had a filling which was placed prior status, doubt. Some mild facial swelling. No fevers. Is been taking Goody powder with some relief.  Past Medical History:  Diagnosis Date  . Arthritis   . Disc disorder   . Hypertension   . Lumbar herniated disc     Patient Active Problem List   Diagnosis Date Noted  . Hallux valgus 08/03/2016  . Disc disorder   . Lumbar facet arthropathy 08/06/2014  . Lumbar radiculopathy 08/06/2014  . ED (erectile dysfunction) 01/07/2014  . Unspecified vitamin D deficiency 01/07/2014  . Sciatica of left side 08/10/2013  . Left shoulder pain 08/10/2013  . Chronic low back pain 08/10/2013  . Essential hypertension, benign 08/10/2013  . Smoking 08/10/2013    Past Surgical History:  Procedure Laterality Date  . FOOT SURGERY     right, ~ 2002       Home Medications    Prior to Admission medications   Medication Sig Start Date End Date Taking? Authorizing Provider  amLODipine (NORVASC) 10 MG tablet Take 1 tablet (10 mg total) by mouth daily. 07/06/16   Maren Reamer, MD  Aspirin-Salicylamide-Caffeine (BC FAST PAIN RELIEF) [phone] MG PACK Take 1 Package by mouth every 6 (six) hours as needed (headaches).    [provider]  cyclobenzaprine (FLEXERIL) 5 MG tablet Take 1 tablet (5 mg total) by mouth 2 (two) times daily as needed for muscle spasms. Patient not taking: Reported on 02/17/2017 12/24/16   Ladell Pier, MD  DULoxetine (CYMBALTA) 30 MG capsule Take 1 capsule (30 mg total) by mouth daily. 12/24/16   Ladell Pier, MD  hydrochlorothiazide (HYDRODIURIL) 25 MG tablet Take 1 tablet (25 mg total) by mouth  daily. 07/06/16   Maren Reamer, MD  penicillin v potassium (VEETID) 500 MG tablet Take 1 tablet (500 mg total) by mouth 3 (three) times daily. 05/04/17   Virgel Manifold, MD  sildenafil (VIAGRA) 100 MG tablet 1 tab PO 1/2 hr before intercourse PRN.  Limit use to 1 tab /24 hr period. 04/19/17   Ladell Pier, MD  traMADol (ULTRAM) 50 MG tablet Take 1 tablet (50 mg total) by mouth every 8 (eight) hours as needed. 05/04/17   Virgel Manifold, MD    Family History Family History  Problem Relation Age of Onset  . Diabetes Mother   . Hypertension Mother   . Heart disease Mother   . Hyperlipidemia Mother   . Diabetes Brother   . Diabetes Daughter   . Hypertension Sister   . Diabetes Sister   . Colon cancer Neg Hx   . Esophageal cancer Neg Hx   . Rectal cancer Neg Hx   . Stomach cancer Neg Hx     Social History Social History  Substance Use Topics  . Smoking status: Current Every Day Smoker    Packs/day: 0.10    Years: 20.00    Types: Cigarettes  . Smokeless tobacco: Never Used  . Alcohol use No     Allergies   Patient has no known allergies.   Review of Systems Review of Systems  All systems reviewed and negative, other  than as noted in HPI.  Physical Exam Updated Vital Signs BP (!) 122/92   Pulse 75   Temp 98.9 F (37.2 C)   Resp 18   SpO2 100%   Physical Exam  Constitutional: He appears well-developed and well-nourished. No distress.  HENT:  Head: Normocephalic.  Mild left facial swelling. Left upper molar with filling which is partially missing. No discrete abscess. Oropharynx is otherwise clear. Normal sounding voice. Handling secretions. Neck supple. No stridor.  Eyes: Conjunctivae are normal. Right eye exhibits no discharge. Left eye exhibits no discharge.  Neck: Neck supple.  Cardiovascular: Normal rate, regular rhythm and normal heart sounds.  Exam reveals no gallop and no friction rub.   No murmur heard. Pulmonary/Chest: Effort normal and breath  sounds normal. No respiratory distress.  Abdominal: Soft. He exhibits no distension. There is no tenderness.  Musculoskeletal: He exhibits no edema or tenderness.  Neurological: He is alert.  Skin: Skin is warm and dry.  Psychiatric: He has a normal mood and affect. His behavior is normal. Thought content normal.  Nursing note and vitals reviewed.    ED Treatments / Results  Labs (all labs ordered are listed, but only abnormal results are displayed) Labs Reviewed - No data to display  EKG  EKG Interpretation None       Radiology No results found.  Procedures Procedures (including critical care time)  Medications Ordered in ED Medications - No data to display   Initial Impression / Assessment and Plan / ED Course  I have reviewed the triage vital signs and the nursing notes.  Pertinent labs & imaging results that were available during my care of the patient were reviewed by me and considered in my medical decision making (see chart for details).     56 year old male with dental pain. Partially missing filling. Mild facial swelling. No clinical evidence of significant airway compromise. Afebrile. Discussed need for definitive dental follow-up. Will place on antibiotics. Dental resources.  Final Clinical Impressions(s) / ED Diagnoses   Final diagnoses:  Pain, dental    New Prescriptions New Prescriptions   PENICILLIN V POTASSIUM (VEETID) 500 MG TABLET    Take 1 tablet (500 mg total) by mouth 3 (three) times daily.   TRAMADOL (ULTRAM) 50 MG TABLET    Take 1 tablet (50 mg total) by mouth every 8 (eight) hours as needed.     Virgel Manifold, MD 05/04/17 [phone]

## 2017-05-04 NOTE — Discharge Instructions (Signed)
See dental resources for definitive care.

## 2017-05-04 NOTE — ED Notes (Signed)
See MD assessment. 

## 2017-05-08 NOTE — Progress Notes (Deleted)
Patient ID: Calvin [name], male   DOB: 09/14/1960, 56 y.o.   MRN: [id number] After being seen in the ED 05/04/2017 for L face and dental pain

## 2017-05-09 ENCOUNTER — Ambulatory Visit: Payer: Self-pay

## 2017-05-10 MED FILL — !VIAGRA 100MG TABLET: 100 | 30 days supply | Qty: 5 | Fill #3

## 2017-05-16 ENCOUNTER — Encounter: Payer: Self-pay | Admitting: Internal Medicine

## 2017-05-16 ENCOUNTER — Ambulatory Visit: Payer: Self-pay | Attending: Internal Medicine | Admitting: Internal Medicine

## 2017-05-16 VITALS — BP 150/89 | HR 71 | Temp 99.0°F | Resp 16 | Wt 150.0 lb

## 2017-05-16 DIAGNOSIS — Z8249 Family history of ischemic heart disease and other diseases of the circulatory system: Secondary | ICD-10-CM | POA: Insufficient documentation

## 2017-05-16 DIAGNOSIS — I10 Essential (primary) hypertension: Secondary | ICD-10-CM | POA: Insufficient documentation

## 2017-05-16 DIAGNOSIS — Z7982 Long term (current) use of aspirin: Secondary | ICD-10-CM | POA: Insufficient documentation

## 2017-05-16 DIAGNOSIS — Z833 Family history of diabetes mellitus: Secondary | ICD-10-CM | POA: Insufficient documentation

## 2017-05-16 DIAGNOSIS — Z23 Encounter for immunization: Secondary | ICD-10-CM | POA: Insufficient documentation

## 2017-05-16 DIAGNOSIS — N529 Male erectile dysfunction, unspecified: Secondary | ICD-10-CM | POA: Insufficient documentation

## 2017-05-16 DIAGNOSIS — Z720 Tobacco use: Secondary | ICD-10-CM

## 2017-05-16 DIAGNOSIS — F17210 Nicotine dependence, cigarettes, uncomplicated: Secondary | ICD-10-CM | POA: Insufficient documentation

## 2017-05-16 DIAGNOSIS — R4589 Other symptoms and signs involving emotional state: Secondary | ICD-10-CM

## 2017-05-16 DIAGNOSIS — M5416 Radiculopathy, lumbar region: Secondary | ICD-10-CM | POA: Insufficient documentation

## 2017-05-16 DIAGNOSIS — M2010 Hallux valgus (acquired), unspecified foot: Secondary | ICD-10-CM | POA: Insufficient documentation

## 2017-05-16 DIAGNOSIS — F329 Major depressive disorder, single episode, unspecified: Secondary | ICD-10-CM | POA: Insufficient documentation

## 2017-05-16 DIAGNOSIS — Z1211 Encounter for screening for malignant neoplasm of colon: Secondary | ICD-10-CM | POA: Insufficient documentation

## 2017-05-16 DIAGNOSIS — D369 Benign neoplasm, unspecified site: Secondary | ICD-10-CM | POA: Insufficient documentation

## 2017-05-16 DIAGNOSIS — Z79899 Other long term (current) drug therapy: Secondary | ICD-10-CM | POA: Insufficient documentation

## 2017-05-16 DIAGNOSIS — F1910 Other psychoactive substance abuse, uncomplicated: Secondary | ICD-10-CM | POA: Insufficient documentation

## 2017-05-16 DIAGNOSIS — Z98811 Dental restoration status: Secondary | ICD-10-CM

## 2017-05-16 MED ORDER — DULOXETINE HCL 30 MG PO CPEP
ORAL_CAPSULE | ORAL | 2 refills | Status: DC
Start: 2017-05-16 — End: 2018-05-15

## 2017-05-16 NOTE — Patient Instructions (Addendum)
Call 1800-Quit Now to get free nicotine patches  Influenza Virus Vaccine injection (Fluarix) What is this medicine? INFLUENZA VIRUS VACCINE (in floo EN zuh VAHY ruhs vak SEEN) helps to reduce the risk of getting influenza also known as the flu. This medicine may be used for other purposes; ask your health care provider or pharmacist if you have questions. COMMON BRAND NAME(S): Fluarix, Fluzone What should I tell my health care provider before I take this medicine? They need to know if you have any of these conditions: -bleeding disorder like hemophilia -fever or infection -Guillain-Barre syndrome or other neurological problems -immune system problems -infection with the human immunodeficiency virus (HIV) or AIDS -low blood platelet counts -multiple sclerosis -an unusual or allergic reaction to influenza virus vaccine, eggs, chicken proteins, latex, gentamicin, other medicines, foods, dyes or preservatives -pregnant or trying to get pregnant -breast-feeding How should I use this medicine? This vaccine is for injection into a muscle. It is given by a health care professional. A copy of Vaccine Information Statements will be given before each vaccination. Read this sheet carefully each time. The sheet may change frequently. Talk to your pediatrician regarding the use of this medicine in children. Special care may be needed. Overdosage: If you think you have taken too much of this medicine contact a poison control center or emergency room at once. NOTE: This medicine is only for you. Do not share this medicine with others. What if I miss a dose? This does not apply. What may interact with this medicine? -chemotherapy or radiation therapy -medicines that lower your immune system like etanercept, anakinra, infliximab, and adalimumab -medicines that treat or prevent blood clots like warfarin -phenytoin -steroid medicines like prednisone or cortisone -theophylline -vaccines This list may  not describe all possible interactions. Give your health care provider a list of all the medicines, herbs, non-prescription drugs, or dietary supplements you use. Also tell them if you smoke, drink alcohol, or use illegal drugs. Some items may interact with your medicine. What should I watch for while using this medicine? Report any side effects that do not go away within 3 days to your doctor or health care professional. Call your health care provider if any unusual symptoms occur within 6 weeks of receiving this vaccine. You may still catch the flu, but the illness is not usually as bad. You cannot get the flu from the vaccine. The vaccine will not protect against colds or other illnesses that may cause fever. The vaccine is needed every year. What side effects may I notice from receiving this medicine? Side effects that you should report to your doctor or health care professional as soon as possible: -allergic reactions like skin rash, itching or hives, swelling of the face, lips, or tongue Side effects that usually do not require medical attention (report to your doctor or health care professional if they continue or are bothersome): -fever -headache -muscle aches and pains -pain, tenderness, redness, or swelling at site where injected -weak or tired This list may not describe all possible side effects. Call your doctor for medical advice about side effects. You may report side effects to FDA at 1-800-FDA-1088. Where should I keep my medicine? This vaccine is only given in a clinic, pharmacy, doctor's office, or other health care setting and will not be stored at home. NOTE: This sheet is a summary. It may not cover all possible information. If you have questions about this medicine, talk to your doctor, pharmacist, or health care provider.  2018  Elsevier/Gold Standard (2008-02-07 09:30:40)

## 2017-05-16 NOTE — Progress Notes (Signed)
Patient ID: Calvin [name], male    DOB: [birth year]  MRN: [id number]  CC: Dental Pain and Referral   Subjective: Everett Ehrler is a 56 y.o. male who presents for chronic ds management. Last seen 12/2016 His concerns today include:  Pt with hx of HTN, chronic LBP, ED, tobacco, cocaine use  1. Seen in ER 11 days ago for abscess tooth. Given abx. Better now. Filling is loose. Has to renew OC to get dental appt.  2. Substance Abuse: has not been to [place] as discussed on last visit. Goes to [place] and NA meetings total 2 x a wk. -clean for 4 days. "I'll be honest but if I can find something that helps with this pain, I take it."  3. Tob dep: still smoking. Would like nicotine patches but can not afford  4. HTN: compliant with HCTZ and Norvasc -checks BP occasionally at [facility] Limits salt No CP/SOB/LE edema. Low K+ on last blood test  5. Depression: had stopped Cymbalta because he felt it was not helping with pain.  HM: had colonoscopy 02/2017 Dr. Fuller Plan with [facility]. Prep not adequate but still one polyp removed from descending colon. Told to schedule again at earliest convenience for repeat study with adequate prep Patient Active Problem List   Diagnosis Date Noted  . Hallux valgus 08/03/2016  . Disc disorder   . Lumbar facet arthropathy 08/06/2014  . Lumbar radiculopathy 08/06/2014  . ED (erectile dysfunction) 01/07/2014  . Unspecified vitamin D deficiency 01/07/2014  . Sciatica of left side 08/10/2013  . Left shoulder pain 08/10/2013  . Chronic low back pain 08/10/2013  . Essential hypertension, benign 08/10/2013  . Smoking 08/10/2013     Current Outpatient Prescriptions on File Prior to Visit  Medication Sig Dispense Refill  . amLODipine (NORVASC) 10 MG tablet Take 1 tablet (10 mg total) by mouth daily. 90 tablet 3  . Aspirin-Salicylamide-Caffeine (BC FAST PAIN RELIEF) 650-195-33.3 MG PACK Take 1 Package by mouth every 6 (six) hours as needed (headaches).    . cyclobenzaprine  (FLEXERIL) 5 MG tablet Take 1 tablet (5 mg total) by mouth 2 (two) times daily as needed for muscle spasms. (Patient not taking: Reported on 02/17/2017) 40 tablet 1  . hydrochlorothiazide (HYDRODIURIL) 25 MG tablet Take 1 tablet (25 mg total) by mouth daily. 90 tablet 3  . penicillin v potassium (VEETID) 500 MG tablet Take 1 tablet (500 mg total) by mouth 3 (three) times daily. 21 tablet 0  . sildenafil (VIAGRA) 100 MG tablet 1 tab PO 1/2 hr before intercourse PRN.  Limit use to 1 tab /24 hr period. 30 tablet 3   Current Facility-Administered Medications on File Prior to Visit  Medication Dose Route Frequency Provider Last Rate Last Dose  . 0.9 %  sodium chloride infusion  500 mL Intravenous Continuous Ladene Artist, MD        No Known Allergies  Social History   Social History  . Marital status: Single    Spouse name: N/A  . Number of children: N/A  . Years of education: N/A   Occupational History  . Not on file.   Social History Main Topics  . Smoking status: Current Every Day Smoker    Packs/day: 0.10    Years: 20.00    Types: Cigarettes  . Smokeless tobacco: Never Used  . Alcohol use No  . Drug use: No  . Sexual activity: Not on file   Other Topics Concern  . Not on file  Social History Narrative  . No narrative on file    Family History  Problem Relation Age of Onset  . Diabetes Mother   . Hypertension Mother   . Heart disease Mother   . Hyperlipidemia Mother   . Diabetes Brother   . Diabetes Daughter   . Hypertension Sister   . Diabetes Sister   . Colon cancer Neg Hx   . Esophageal cancer Neg Hx   . Rectal cancer Neg Hx   . Stomach cancer Neg Hx     Past Surgical History:  Procedure Laterality Date  . FOOT SURGERY     right, ~ 2002    ROS: Review of Systems Negative except as above PHYSICAL EXAM: BP (!) 150/89   Pulse 71   Temp 99 F (37.2 C) (Oral)   Resp 16   Wt 150 lb (68 kg)   SpO2 99%   BMI 20.34 kg/m   Wt Readings from Last 3  Encounters:  05/16/17 150 lb (68 kg)  02/25/17 151 lb (68.5 kg)  02/17/17 151 lb 6.4 oz (68.7 kg)    Physical Exam  General appearance - alert, well appearing, and in no distress Mental status - alert, oriented to person, place, and time, normal mood, behavior, speech, dress, motor activity, and thought processes Mouth - mucous membranes moist, pharynx normal without lesions. Filling in LT 3rd molar partially gone Neck - supple, no significant adenopathy Chest - clear to auscultation, no wheezes, rales or rhonchi, symmetric air entry Heart - normal rate, regular rhythm, normal S1, S2, no murmurs, rubs, clicks or gallops Extremities - peripheral pulses normal, no pedal edema, no clubbing or cyanosis  Depression screen [facility] 2/9 05/16/2017 12/24/2016 07/06/2016 03/05/2016 02/02/2016  Decreased Interest 2 2 3 2 3   Down, Depressed, Hopeless 2 2 3 2 2   PHQ - 2 Score 4 4 6 4 5   Altered sleeping 3 2 3 2 2   Tired, decreased energy 2 2 3 2 3   Change in appetite 2 2 2 2 3   Feeling bad or failure about yourself  2 2 2 2 2   Trouble concentrating 2 2 3 2 3   Moving slowly or fidgety/restless 1 1 0 1 2  Suicidal thoughts 2 1 0 1 2  PHQ-9 Score 18 16 19 16 22   Difficult doing work/chores - - - Very difficult Not difficult at all    ASSESSMENT AND PLAN: 1. Essential hypertension -at goal on Norvasc/HCTZ. - Potassium  2. Dental filling status - Ambulatory referral to Dentistry  3. Tobacco abuse -Wants to quit. pt to call 1800-Quit Now   4. Substance abuse ([place]) -encourage to abstain. Plans to continue NA and AA meetings  5. Colon cancer screening -pt needs repeat colonoscopy with adequate prep - Ambulatory referral to Gastroenterology  6. Tubular adenoma - Ambulatory referral to Gastroenterology  7. Lumbar radiculopathy -pt willing to try Cymbalta again with titration  8. Depressed mood -willing to try Cymbalta again with titration - DULoxetine (CYMBALTA) 30 MG capsule; 1 tab PO  daily x 3 wks then 2 tabs daily.  Dispense: 60 capsule; Refill: 2  Patient was given the opportunity to ask questions.  Patient verbalized understanding of the plan and was able to repeat key elements of the plan.   Orders Placed This Encounter  Procedures  . Potassium  . Ambulatory referral to Dentistry  . Ambulatory referral to Gastroenterology     Requested Prescriptions   Signed Prescriptions Disp Refills  . DULoxetine (CYMBALTA) 30  MG capsule 60 capsule 2    Sig: 1 tab PO daily x 3 wks then 2 tabs daily.    Return in about 3 months (around 08/16/2017).  Karle Plumber, MD, FACP

## 2017-05-17 LAB — POTASSIUM: Potassium: 4 mmol/L (ref 3.5–5.2)

## 2017-05-23 ENCOUNTER — Encounter: Payer: Self-pay | Admitting: Gastroenterology

## 2017-05-25 ENCOUNTER — Ambulatory Visit: Payer: Self-pay | Attending: Internal Medicine

## 2017-06-03 MED FILL — !VIAGRA 100MG TABLET: 100 | 30 days supply | Qty: 5 | Fill #4

## 2017-06-03 MED FILL — ?CYCLOBENZAPRINE 10 MG TABL: 10 | 30 days supply | Qty: 60 | Fill #0

## 2017-06-22 ENCOUNTER — Other Ambulatory Visit: Payer: Self-pay

## 2017-06-22 ENCOUNTER — Ambulatory Visit (AMBULATORY_SURGERY_CENTER): Payer: Self-pay | Admitting: *Deleted

## 2017-06-22 VITALS — Ht 73.0 in | Wt 156.0 lb

## 2017-06-22 DIAGNOSIS — Z86010 Personal history of colonic polyps: Secondary | ICD-10-CM

## 2017-06-22 MED ORDER — POLYETHYLENE GLYCOL 3350 17 GM/SCOOP PO POWD: 1.0000 | Freq: Every day | ORAL | 0 refills | Status: DC

## 2017-06-22 MED ORDER — BISACODYL 5 MG PO TBEC: 5.0000 mg | DELAYED_RELEASE_TABLET | Freq: Every day | ORAL | 0 refills | Status: DC | PRN
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
## 2017-06-29 MED FILL — !VIAGRA 100MG TABLET: 100 | 30 days supply | Qty: 5 | Fill #5

## 2017-07-06 ENCOUNTER — Encounter: Payer: Self-pay | Admitting: Gastroenterology

## 2017-07-13 MED FILL — !VIAGRA 100MG TABLET: 100 | 30 days supply | Qty: 10 | Fill #6

## 2017-08-12 ENCOUNTER — Ambulatory Visit: Payer: Self-pay | Admitting: Internal Medicine

## 2017-08-12 MED FILL — $VIAGRA 100 MG TABLET: 100 | 30 days supply | Qty: 10 | Fill #7

## 2017-08-18 ENCOUNTER — Ambulatory Visit: Payer: Self-pay | Attending: Internal Medicine | Admitting: Internal Medicine

## 2017-08-18 ENCOUNTER — Encounter: Payer: Self-pay | Admitting: Internal Medicine

## 2017-08-18 VITALS — BP 136/83 | HR 76 | Temp 98.3°F | Resp 16 | Ht 73.0 in | Wt 153.2 lb

## 2017-08-18 DIAGNOSIS — M5126 Other intervertebral disc displacement, lumbar region: Secondary | ICD-10-CM | POA: Insufficient documentation

## 2017-08-18 DIAGNOSIS — G894 Chronic pain syndrome: Secondary | ICD-10-CM | POA: Insufficient documentation

## 2017-08-18 DIAGNOSIS — F17210 Nicotine dependence, cigarettes, uncomplicated: Secondary | ICD-10-CM | POA: Insufficient documentation

## 2017-08-18 DIAGNOSIS — Z86010 Personal history of colonic polyps: Secondary | ICD-10-CM | POA: Insufficient documentation

## 2017-08-18 DIAGNOSIS — M5416 Radiculopathy, lumbar region: Secondary | ICD-10-CM

## 2017-08-18 DIAGNOSIS — E559 Vitamin D deficiency, unspecified: Secondary | ICD-10-CM | POA: Insufficient documentation

## 2017-08-18 DIAGNOSIS — Z79899 Other long term (current) drug therapy: Secondary | ICD-10-CM | POA: Insufficient documentation

## 2017-08-18 DIAGNOSIS — M5124 Other intervertebral disc displacement, thoracic region: Secondary | ICD-10-CM | POA: Insufficient documentation

## 2017-08-18 DIAGNOSIS — M48061 Spinal stenosis, lumbar region without neurogenic claudication: Secondary | ICD-10-CM | POA: Insufficient documentation

## 2017-08-18 DIAGNOSIS — M5127 Other intervertebral disc displacement, lumbosacral region: Secondary | ICD-10-CM | POA: Insufficient documentation

## 2017-08-18 DIAGNOSIS — F329 Major depressive disorder, single episode, unspecified: Secondary | ICD-10-CM | POA: Insufficient documentation

## 2017-08-18 DIAGNOSIS — N529 Male erectile dysfunction, unspecified: Secondary | ICD-10-CM | POA: Insufficient documentation

## 2017-08-18 DIAGNOSIS — Z8371 Family history of colonic polyps: Secondary | ICD-10-CM | POA: Insufficient documentation

## 2017-08-18 DIAGNOSIS — M545 Low back pain: Secondary | ICD-10-CM | POA: Insufficient documentation

## 2017-08-18 DIAGNOSIS — Z720 Tobacco use: Secondary | ICD-10-CM

## 2017-08-18 DIAGNOSIS — Z7982 Long term (current) use of aspirin: Secondary | ICD-10-CM | POA: Insufficient documentation

## 2017-08-18 DIAGNOSIS — I10 Essential (primary) hypertension: Secondary | ICD-10-CM | POA: Insufficient documentation

## 2017-08-18 DIAGNOSIS — Z1211 Encounter for screening for malignant neoplasm of colon: Secondary | ICD-10-CM

## 2017-08-18 MED ORDER — CYCLOBENZAPRINE HCL 5 MG PO TABS: 5.0000 mg | ORAL_TABLET | Freq: Two times a day (BID) | ORAL | 1 refills | Status: DC | PRN
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
## 2017-08-18 NOTE — Patient Instructions (Addendum)
Give follow up visit with RN in 2 weeks for repeat BP check.  Hold off on restarting Amlodipine and HCTZ. Okay to take Ibuprofen as needed but stop the [facility] powder.   Continue to work on quitting the cigarettes.   Call [facility] to reschedule colonoscopy.

## 2017-09-12 ENCOUNTER — Other Ambulatory Visit: Payer: Self-pay | Admitting: Internal Medicine

## 2017-09-12 DIAGNOSIS — N529 Male erectile dysfunction, unspecified: Secondary | ICD-10-CM

## 2017-09-14 MED FILL — $VIAGRA 100 MG TABLET: 100 | 3 days supply | Qty: 1 | Fill #8

## 2017-09-20 ENCOUNTER — Other Ambulatory Visit: Payer: Self-pay | Admitting: Internal Medicine

## 2017-09-20 ENCOUNTER — Telehealth: Payer: Self-pay | Admitting: Internal Medicine

## 2017-09-20 DIAGNOSIS — N529 Male erectile dysfunction, unspecified: Secondary | ICD-10-CM

## 2017-09-20 NOTE — Telephone Encounter (Signed)
Patient called requesting a refill on viagra. Please fu with patient

## 2017-09-21 MED ORDER — SILDENAFIL CITRATE 100 MG PO TABS: ORAL_TABLET | ORAL | 3 refills | Status: DC

## 2017-09-21 NOTE — Telephone Encounter (Signed)
RF on Viagra sent to [facility].

## 2017-09-23 MED FILL — $VIAGRA 100 MG TABLET: 100 | 30 days supply | Qty: 10 | Fill #0

## 2017-10-19 MED FILL — $VIAGRA 100 MG TABLET: 100 | 30 days supply | Qty: 10 | Fill #1

## 2017-12-02 MED FILL — $VIAGRA 100 MG TABLET: 100 | 30 days supply | Qty: 10 | Fill #2

## 2017-12-02 MED FILL — CYCLOBENZAPRINE 5 MG TABLET: 5 | 20 days supply | Qty: 40 | Fill #0

## 2018-01-04 MED FILL — CYCLOBENZAPRINE 5 MG TABLET: 5 | 20 days supply | Qty: 40 | Fill #1

## 2018-01-04 MED FILL — $VIAGRA 100 MG TABLET: 100 | 30 days supply | Qty: 10 | Fill #3

## 2018-01-20 ENCOUNTER — Encounter (HOSPITAL_COMMUNITY): Payer: Self-pay | Admitting: Emergency Medicine

## 2018-01-20 ENCOUNTER — Other Ambulatory Visit: Payer: Self-pay

## 2018-01-20 ENCOUNTER — Emergency Department (HOSPITAL_COMMUNITY): Payer: Medicaid Other

## 2018-01-20 ENCOUNTER — Emergency Department (HOSPITAL_COMMUNITY)
Admission: EM | Admit: 2018-01-20 | Discharge: 2018-01-21 | Disposition: A | Discharge status: Home / Self Care | Payer: Medicaid Other | Attending: Emergency Medicine | Admitting: Emergency Medicine

## 2018-01-20 DIAGNOSIS — F329 Major depressive disorder, single episode, unspecified: Secondary | ICD-10-CM | POA: Insufficient documentation

## 2018-01-20 DIAGNOSIS — Y9389 Activity, other specified: Secondary | ICD-10-CM | POA: Insufficient documentation

## 2018-01-20 DIAGNOSIS — W228XXA Striking against or struck by other objects, initial encounter: Secondary | ICD-10-CM | POA: Diagnosis not present

## 2018-01-20 DIAGNOSIS — S91114A Laceration without foreign body of right lesser toe(s) without damage to nail, initial encounter: Secondary | ICD-10-CM | POA: Insufficient documentation

## 2018-01-20 DIAGNOSIS — Y92009 Unspecified place in unspecified non-institutional (private) residence as the place of occurrence of the external cause: Secondary | ICD-10-CM | POA: Diagnosis not present

## 2018-01-20 DIAGNOSIS — I10 Essential (primary) hypertension: Secondary | ICD-10-CM | POA: Diagnosis not present

## 2018-01-20 DIAGNOSIS — F17210 Nicotine dependence, cigarettes, uncomplicated: Secondary | ICD-10-CM | POA: Diagnosis not present

## 2018-01-20 DIAGNOSIS — Z79899 Other long term (current) drug therapy: Secondary | ICD-10-CM | POA: Insufficient documentation

## 2018-01-20 DIAGNOSIS — Y998 Other external cause status: Secondary | ICD-10-CM | POA: Diagnosis not present

## 2018-01-20 MED ORDER — LIDOCAINE HCL (PF) 1 % IJ SOLN
5.0000 mL | Freq: Once | INTRAMUSCULAR | Status: AC
  Administered 2018-01-21: 5 mL
  Filled 2018-01-20: qty 5

## 2018-01-20 NOTE — ED Provider Notes (Signed)
[facility] EMERGENCY DEPARTMENT Provider Note   CSN: [id number] Arrival date & time: 01/20/18  2129     History   Chief Complaint Chief Complaint  Patient presents with  . Toe Injury    HPI Calvin [name] is a 57 y.o. male with a past medical history of hypertension, who presents to ED for evaluation of laceration to right second toe that occurred just prior to arrival.  He was walking at his home barefoot when he got his foot caught in a rug.  He states that he has had surgery on the foot before but is unable to specify where or what the surgery was for.  He has tried walking without any difficulty after the incident occurred.  He is concerned that the toe may be broken.  He states that his last tetanus shot was 3 years ago.  Denies any blood thinner use, numbness in legs, additional injuries or falls.  HPI  Past Medical History:  Diagnosis Date  . Arthritis    back, shoulders   . Constipation   . Disc disorder   . Hypertension   . Lumbar herniated disc   . Muscle spasm     Patient Active Problem List   Diagnosis Date Noted  . Tubular adenoma 05/16/2017  . Depressed mood 05/16/2017  . Substance abuse ([place]) 05/16/2017  . Hallux valgus 08/03/2016  . Disc disorder   . Lumbar facet arthropathy 08/06/2014  . Lumbar radiculopathy 08/06/2014  . ED (erectile dysfunction) 01/07/2014  . Unspecified vitamin D deficiency 01/07/2014  . Left shoulder pain 08/10/2013  . Chronic low back pain 08/10/2013  . Essential hypertension 08/10/2013  . Tobacco abuse 08/10/2013    Past Surgical History:  Procedure Laterality Date  . COLONOSCOPY    . FOOT SURGERY     right, ~ 2002  . POLYPECTOMY          Home Medications    Prior to Admission medications   Medication Sig Start Date End Date Taking? Authorizing Provider  amLODipine (NORVASC) 10 MG tablet Take 1 tablet (10 mg total) by mouth daily. 07/06/16   Maren Reamer, MD  Aspirin-Salicylamide-Caffeine (BC  FAST PAIN RELIEF) [phone] MG PACK Take 1 Package by mouth every 6 (six) hours as needed (headaches).    [provider]  bisacodyl (DULCOLAX) 5 MG EC tablet Take 1 tablet (5 mg total) by mouth daily as needed for moderate constipation. 06/22/17   Ladene Artist, MD  cyclobenzaprine (FLEXERIL) 5 MG tablet Take 1 tablet (5 mg total) by mouth 2 (two) times daily as needed for muscle spasms. 08/18/17   Ladell Pier, MD  DULoxetine (CYMBALTA) 30 MG capsule 1 tab PO daily x 3 wks then 2 tabs daily. 05/16/17   Ladell Pier, MD  hydrochlorothiazide (HYDRODIURIL) 25 MG tablet Take 1 tablet (25 mg total) by mouth daily. 07/06/16   Maren Reamer, MD  ibuprofen (ADVIL,MOTRIN) 200 MG tablet Take 1,200 mg by mouth every 6 (six) hours as needed.    [provider]  polyethylene glycol powder (GLYCOLAX/MIRALAX) powder Take 255 g by mouth daily. 06/22/17   Ladene Artist, MD  sildenafil (VIAGRA) 100 MG tablet 1 tab PO 1/2 hr before intercourse PRN.  Limit use to 1 tab /24 hr period. 09/21/17   Ladell Pier, MD    Family History Family History  Problem Relation Age of Onset  . Diabetes Mother   . Hypertension Mother   . Heart disease  Mother   . Hyperlipidemia Mother   . Diabetes Brother   . Diabetes Daughter   . Hypertension Sister   . Diabetes Sister   . Stomach cancer Maternal Grandfather   . Colon cancer Neg Hx   . Esophageal cancer Neg Hx   . Rectal cancer Neg Hx   . Colon polyps Neg Hx     Social History Social History   Tobacco Use  . Smoking status: Current Every Day Smoker    Packs/day: 0.10    Years: 20.00    Pack years: 2.00    Types: Cigarettes  . Smokeless tobacco: Never Used  . Tobacco comment: 5 cigs a day   Substance Use Topics  . Alcohol use: No  . Drug use: No     Allergies   Patient has no known allergies.   Review of Systems Review of Systems  Constitutional: Negative for chills and fever.  Musculoskeletal: Positive  for arthralgias. Negative for myalgias.  Skin: Positive for wound.  Neurological: Negative for weakness and numbness.     Physical Exam Updated Vital Signs BP (!) 142/103 (BP Location: Left Arm)   Pulse 71   Temp 98.5 F (36.9 C) (Oral)   Resp 18   Ht 6' (1.829 m)   Wt 70.3 kg (155 lb)   SpO2 98%   BMI 21.02 kg/m   Physical Exam  Constitutional: He appears well-developed and well-nourished. No distress.  HENT:  Head: Normocephalic and atraumatic.  Eyes: Conjunctivae and EOM are normal. No scleral icterus.  Neck: Normal range of motion.  Pulmonary/Chest: Effort normal. No respiratory distress.  Neurological: He is alert.  Skin: Laceration noted. No rash noted. He is not diaphoretic.  3 cm laceration at the base of the second right toe.  Bleeding is controlled.  No changes to range of motion noted.  Psychiatric: He has a normal mood and affect.  Nursing note and vitals reviewed.    ED Treatments / Results  Labs (all labs ordered are listed, but only abnormal results are displayed) Labs Reviewed - No data to display  EKG None  Radiology Dg Foot Complete Right  Result Date: 01/20/2018 CLINICAL DATA:  Second digit pain with laceration to top of toe. EXAM: RIGHT FOOT COMPLETE - 3+ VIEW COMPARISON:  None. FINDINGS: Fixation screws are noted of the mid right first proximal phalanx and proximal first metatarsal with bunionectomy defect of the medial first metatarsal head and neck. No hardware failure is noted. No acute fracture. 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 Degenerative change noted of the fifth PIP joint as well. Small plantar calcaneal enthesophyte is noted. No frank bone destruction is seen. IMPRESSION: Bunionectomy change of the great toe. Osteoarthritis of the second toe without radiopaque foreign body nor fracture. The reported laceration is radiographically occult. Electronically Signed   By: Ashley Royalty M.D.   On: 01/20/2018 22:41    Procedures .Marland KitchenLaceration Repair Date/Time: 01/21/2018 12:18 AM Performed by: Delia Heady, PA-C Authorized by: Delia Heady, PA-C   Consent:    Consent obtained:  Verbal   Consent given by:  Patient   Risks discussed:  Infection, need for additional repair, nerve damage, pain, poor cosmetic result, retained foreign body, tendon damage, vascular damage and poor wound healing Anesthesia (see MAR for exact dosages):    Anesthesia method:  Local infiltration   Local anesthetic:  Lidocaine 1% w/o epi Laceration details:    Location:  Toe   Toe location:  R second toe   Length (cm):  3 Exploration:    Hemostasis achieved with:  Direct pressure Treatment:    Area cleansed with:  Betadine and saline   Amount of cleaning:  Standard   Irrigation solution:  Sterile saline Skin repair:    Repair method:  Sutures   Suture size:  4-0   Suture material:  Nylon   Suture technique:  Simple interrupted   Number of sutures:  6 Approximation:    Approximation:  Close Post-procedure details:    Dressing:  Open (no dressing)   Patient tolerance of procedure:  Tolerated with difficulty   (including critical care time)  Medications Ordered in ED Medications  lidocaine (PF) (XYLOCAINE) 1 % injection 5 mL (has no administration in time range)     Initial Impression / Assessment and Plan / ED Course  I have reviewed the triage vital signs and the nursing notes.  Pertinent labs & imaging results that were available during my care of the patient were reviewed by me and considered in my medical decision making (see chart for details).     Patient presents for right second toe laceration that occurred prior to arrival.  He tripped over a rug.  X-rays negative for acute abnormality.  Area was closed with sutures. Patient counseled on wound care. Patient counseled on need to return or see PCP/urgent care for suture removal in 12-14 days. Patient was urged to  return to the Emergency Department urgently with worsening pain, swelling, expanding erythema especially if it streaks away from the affected area, fever, or if they have any other concerns. Patient verbalized understanding.  States last tetanus was 3 yrs ago.   Portions of this note were generated with [employer]. Dictation errors may occur despite best attempts at proofreading.  Final Clinical Impressions(s) / ED Diagnoses   Final diagnoses:  Laceration of lesser toe of right foot without foreign body present or damage to nail, initial encounter    ED Discharge Orders    None       Delia Heady, PA-C 01/21/18 0020    Julianne Rice, MD 01/24/18 1517

## 2018-01-20 NOTE — ED Triage Notes (Signed)
Pt reports lac to R second toe and possible fracture, reports he got it caught on a rug one hour ago.

## 2018-01-20 NOTE — ED Notes (Signed)
Patient transported to X-ray 

## 2018-01-21 NOTE — Discharge Instructions (Signed)
Return in 12-14 days for suture removal.

## 2018-01-23 ENCOUNTER — Other Ambulatory Visit: Payer: Self-pay | Admitting: Internal Medicine

## 2018-01-23 DIAGNOSIS — M5416 Radiculopathy, lumbar region: Secondary | ICD-10-CM

## 2018-01-30 ENCOUNTER — Other Ambulatory Visit: Payer: Self-pay

## 2018-01-30 ENCOUNTER — Encounter (HOSPITAL_COMMUNITY): Payer: Self-pay

## 2018-01-30 ENCOUNTER — Emergency Department (HOSPITAL_COMMUNITY)
Admission: EM | Admit: 2018-01-30 | Discharge: 2018-01-30 | Disposition: A | Discharge status: Home / Self Care | Payer: Medicaid Other | Attending: Emergency Medicine | Admitting: Emergency Medicine

## 2018-01-30 DIAGNOSIS — Z5189 Encounter for other specified aftercare: Secondary | ICD-10-CM | POA: Diagnosis not present

## 2018-01-30 DIAGNOSIS — F17210 Nicotine dependence, cigarettes, uncomplicated: Secondary | ICD-10-CM | POA: Diagnosis not present

## 2018-01-30 DIAGNOSIS — I10 Essential (primary) hypertension: Secondary | ICD-10-CM | POA: Insufficient documentation

## 2018-01-30 DIAGNOSIS — Z4802 Encounter for removal of sutures: Secondary | ICD-10-CM | POA: Insufficient documentation

## 2018-01-30 DIAGNOSIS — Z79899 Other long term (current) drug therapy: Secondary | ICD-10-CM | POA: Diagnosis not present

## 2018-01-30 NOTE — ED Provider Notes (Addendum)
Patient placed in Quick Look pathway, seen and evaluated   Chief Complaint: Right second toe wound  HPI:   Patient presents with complaint of popped sutures.  He was seen and evaluated on 01/20/2018 for laceration of the right second toe.  He thinks that approximately 2 days ago some of the sutures popped open.  He thinks this is because he will occasionally have back spasms which resulted in him curling his toes involuntarily.  He states it has been draining some serosanguineous fluid.  Denies fevers, numbness, or weakness.  Denies significant pain.  He has had surgery to the toe in the past in which he had a fusion of some sort and has had decreased range of motion with flexion extension of the toe since then.  States this has not changed.  ROS: Positive for wound negative for fevers, numbness, or weakness.  Physical Exam:   Gen: No distress  Neuro: Awake and Alert  Skin: Warm    Focused Exam: Three 7 m open wound to the dorsum of the right second toe.  Bleeding is controlled although there is a small amount of oozing of blood.  He is able to flex the toes somewhat but has difficulty extending but states this is his baseline.  Sensation intact to soft touch of the bilateral feet.  No surrounding erythema or induration. Appears that there may be 2-3 sutures out of place.    Initiation of care has begun. The patient has been counseled on the process, plan, and necessity for staying for the completion/evaluation, and the remainder of the medical screening examination     Debroah Baller 01/30/18 Burney, Kevin, MD 01/31/18 [phone]

## 2018-01-30 NOTE — ED Notes (Signed)
Pt has laceration to second right toe. Reports he got stitches but they came out on their own a few days ago. No redness or drainage notes at this time, area is open with on stitch still in place. Pt in wheelchair declined moving to recliner chair. Pt. Given call bell.

## 2018-01-30 NOTE — ED Triage Notes (Signed)
Pt endorses having stitches placed on 2nd toe on right foot 06/28 and they came out 2 days ago. No drainage noted.

## 2018-01-30 NOTE — ED Provider Notes (Signed)
[facility] EMERGENCY DEPARTMENT Provider Note   CSN: [id number] Arrival date & time: 01/30/18  1359     History   Chief Complaint Chief Complaint  Patient presents with  . Suture / Staple Removal    sutures came out    HPI Calvin [name] is a 57 y.o. male with a hx of tobacco abuse, arthritis, and HTN who presents to the ED for concern related to sutures placed 01/20/18. 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 Laceration repair was performed with placement of 6 sutures. He reports that 2 days ago he curled his toes causing sutures to pop off, he is unsure how many came off. He states it has been draining some serosanguineous fluid.  Denies fevers, numbness, or weakness.  Denies significant pain.  He has had surgery to the toe in the past in which he had a fusion of some sort and has had decreased range of motion with flexion extension of the toe since then, unchanged. Last tetanus was 3 years ago.   HPI  Past Medical History:  Diagnosis Date  . Arthritis    back, shoulders   . Constipation   . Disc disorder   . Hypertension   . Lumbar herniated disc   . Muscle spasm     Patient Active Problem List   Diagnosis Date Noted  . Tubular adenoma 05/16/2017  . Depressed mood 05/16/2017  . Substance abuse ([place]) 05/16/2017  . Hallux valgus 08/03/2016  . Disc disorder   . Lumbar facet arthropathy 08/06/2014  . Lumbar radiculopathy 08/06/2014  . ED (erectile dysfunction) 01/07/2014  . Unspecified vitamin D deficiency 01/07/2014  . Left shoulder pain 08/10/2013  . Chronic low back pain 08/10/2013  . Essential hypertension 08/10/2013  . Tobacco abuse 08/10/2013    Past Surgical History:  Procedure Laterality Date  . COLONOSCOPY    . FOOT SURGERY     right, ~ 2002  . POLYPECTOMY          Home Medications    Prior to  Admission medications   Medication Sig Start Date End Date Taking? Authorizing Provider  amLODipine (NORVASC) 10 MG tablet Take 1 tablet (10 mg total) by mouth daily. 07/06/16   Maren Reamer, MD  Aspirin-Salicylamide-Caffeine (BC FAST PAIN RELIEF) [phone] MG PACK Take 1 Package by mouth every 6 (six) hours as needed (headaches).    [provider]  bisacodyl (DULCOLAX) 5 MG EC tablet Take 1 tablet (5 mg total) by mouth daily as needed for moderate constipation. 06/22/17   Ladene Artist, MD  cyclobenzaprine (FLEXERIL) 5 MG tablet Take 1 tablet (5 mg total) by mouth 2 (two) times daily as needed for muscle spasms. 08/18/17   Ladell Pier, MD  DULoxetine (CYMBALTA) 30 MG capsule 1 tab PO daily x 3 wks then 2 tabs daily. 05/16/17   Ladell Pier, MD  hydrochlorothiazide (HYDRODIURIL) 25 MG tablet Take 1 tablet (25 mg total) by mouth daily. 07/06/16   Maren Reamer, MD  ibuprofen (ADVIL,MOTRIN) 200 MG tablet Take 1,200 mg by mouth every 6 (six) hours as needed.    [provider]  polyethylene glycol powder (GLYCOLAX/MIRALAX) powder Take 255 g by mouth daily. 06/22/17   Ladene Artist, MD  sildenafil (VIAGRA) 100 MG tablet 1 tab PO 1/2 hr before intercourse PRN.  Limit use  to 1 tab /24 hr period. 09/21/17   Ladell Pier, MD    Family History Family History  Problem Relation Age of Onset  . Diabetes Mother   . Hypertension Mother   . Heart disease Mother   . Hyperlipidemia Mother   . Diabetes Brother   . Diabetes Daughter   . Hypertension Sister   . Diabetes Sister   . Stomach cancer Maternal Grandfather   . Colon cancer Neg Hx   . Esophageal cancer Neg Hx   . Rectal cancer Neg Hx   . Colon polyps Neg Hx     Social History Social History   Tobacco Use  . Smoking status: Current Every Day Smoker    Packs/day: 0.10    Years: 20.00    Pack years: 2.00    Types: Cigarettes  . Smokeless tobacco: Never Used  . Tobacco comment: 5 cigs a  day   Substance Use Topics  . Alcohol use: No  . Drug use: No     Allergies   Patient has no known allergies.   Review of Systems Review of Systems  Constitutional: Negative for chills and fever.  Skin: Positive for wound.  Neurological: Negative for weakness and numbness.     Physical Exam Updated Vital Signs BP (!) 173/119 (BP Location: Right Arm)   Pulse 89   Temp 98.9 F (37.2 C) (Oral)   Resp 18   Ht 6' (1.829 m)   Wt 70.3 kg (155 lb)   SpO2 100%   BMI 21.02 kg/m   Physical Exam  Constitutional: He appears well-developed and well-nourished. No distress.  HENT:  Head: Normocephalic and atraumatic.  Eyes: Conjunctivae are normal. Right eye exhibits no discharge. Left eye exhibits no discharge.  Cardiovascular:  Pulses:      Dorsalis pedis pulses are 2+ on the right side, and 2+ on the left side.  Musculoskeletal:  R 2nd toe has an approximately 2 cm laceration to the dorsum of the proximal aspect of the digit. Laceration appears to have portions of 4 sutures in place. There is an area that is scabbed over. There does appear to be some serosanguinous type drainage from the area. No active bleeding. No appreciable FBs other than sutures. No surrounding erythema/warmth. No purulent drainage. No palpable fluctuance. Patient able to somewhat flex and extend the digit, states this is baseline. NVI distally.   Neurological: He is alert.  Clear speech. Sensation grossly intact to lower extremities. 5/5 strength with plantar/dorsiflexion bilaterally.   Skin: Capillary refill takes less than 2 seconds.  Psychiatric: He has a normal mood and affect. His behavior is normal. Thought content normal.  Nursing note and vitals reviewed.        ED Treatments / Results  Labs (all labs ordered are listed, but only abnormal results are displayed) Labs Reviewed - No data to display  EKG None  Radiology No results found.  Procedures .Suture Removal Date/Time: 01/30/2018  4:46 PM Performed by: Amaryllis Dyke, PA-C Authorized by: Amaryllis Dyke, PA-C   Consent:    Consent obtained:  Verbal   Consent given by:  Patient   Risks discussed:  Bleeding, pain and wound separation   Alternatives discussed:  No treatment Location:    Location:  Lower extremity   Lower extremity location:  Toe   Toe location:  R second toe Procedure details:    Number of sutures removed:  4 Post-procedure details:    Post-removal:  Antibiotic ointment applied and  dressing applied (1L sterile water pressure irrigation performed. )   Patient tolerance of procedure:  Tolerated well, no immediate complications   (including critical care time)  Medications Ordered in ED Medications - No data to display   Initial Impression / Assessment and Plan / ED Course  I have reviewed the triage vital signs and the nursing notes.  Pertinent labs & imaging results that were available during my care of the patient were reviewed by me and considered in my medical decision making (see chart for details).   Patient presents for sutures coming undone from wound prior to planned removal. Patient nontoxic appearing, in no apparent distress, vitals WNL other than elevated BP- doubt HTN emergency, patient aware of need for recheck.  Wound as described/pictured above. There does appear to be portions of 4 sutures in place and the wound does appear to have re-opened.  There is mild serosanguineous drainage.  Patient without fever, no surrounding erythema/warmth or purulent drainage to raise concern for infection. Patient NVI distally. Discussed findings and plan of care with supervising physician Dr. Dina Rich- recommends removing remaining sutures, wound care, and plan for healing by secondary intention, would not start on abx- return precautions. Plan carried out as above. Sutures removed, irrigation performed, dressing and splint in place. Will have patient follow up with wound care clinic. I  discussed treatment plan, need for follow-up, and return precautions with the patient. Provided opportunity for questions, patient confirmed understanding and is in agreement with plan.   Vitals:   01/30/18 1429 01/30/18 1654  BP: (!) 173/119 (!) 147/98  Pulse: 89 68  Resp: 18 18  Temp: 98.9 F (37.2 C)   SpO2: 100% 99%    Final Clinical Impressions(s) / ED Diagnoses   Final diagnoses:  Encounter for wound re-check    ED Discharge Orders    None       Amaryllis Dyke, PA-C 01/30/18 1739    Merryl Hacker, MD 01/30/18 2152

## 2018-01-30 NOTE — Discharge Instructions (Addendum)
You were seen in the ER for problems with your stitches. The stitches were removed. This wound will have to heal on its own. This was irrigated and cleaned in the Er. Please keep this area clean and dry. Avoid excess bending of the area. We would like you to follow up with your primary care provider or wound care within 3 days for re-evaluation. Return to the ER for redness around the wound, pus type drainage, fever, or any other concerns.   Additionally your blood pressure was elevated in the ER today, please have this rechecked by your primary care doctor within 1 week.

## 2018-02-03 ENCOUNTER — Other Ambulatory Visit: Payer: Self-pay | Admitting: Internal Medicine

## 2018-02-03 DIAGNOSIS — M5416 Radiculopathy, lumbar region: Secondary | ICD-10-CM

## 2018-02-03 MED FILL — CYCLOBENZAPRINE 5 MG TABLET: 5 | 20 days supply | Qty: 40 | Fill #0

## 2018-02-03 MED FILL — $VIAGRA 100 MG TABLET: 100 | 30 days supply | Qty: 10 | Fill #4

## 2018-02-07 ENCOUNTER — Ambulatory Visit (INDEPENDENT_AMBULATORY_CARE_PROVIDER_SITE_OTHER): Payer: Medicaid Other | Admitting: Orthopaedic Surgery

## 2018-02-07 ENCOUNTER — Ambulatory Visit (INDEPENDENT_AMBULATORY_CARE_PROVIDER_SITE_OTHER): Payer: Medicaid Other

## 2018-02-07 VITALS — BP 154/102 | HR 85 | Ht 72.0 in | Wt 155.0 lb

## 2018-02-07 DIAGNOSIS — M5441 Lumbago with sciatica, right side: Secondary | ICD-10-CM | POA: Diagnosis not present

## 2018-02-07 DIAGNOSIS — R2681 Unsteadiness on feet: Secondary | ICD-10-CM | POA: Diagnosis not present

## 2018-02-07 DIAGNOSIS — M5030 Other cervical disc degeneration, unspecified cervical region: Secondary | ICD-10-CM

## 2018-02-07 DIAGNOSIS — M6281 Muscle weakness (generalized): Secondary | ICD-10-CM

## 2018-02-07 DIAGNOSIS — M5442 Lumbago with sciatica, left side: Secondary | ICD-10-CM

## 2018-02-07 DIAGNOSIS — G8929 Other chronic pain: Secondary | ICD-10-CM

## 2018-02-07 NOTE — Progress Notes (Signed)
Office Visit Note   Patient: Calvin [name]           Date of Birth: [birth year]           MRN: [id number] Visit Date: 02/07/2018              Requested by: Ladell Pier, MD [address] [place], [place] [id number] PCP: Ladell Pier, MD   Assessment & Plan: Visit Diagnoses:  1. Chronic bilateral low back pain with bilateral sciatica   2. Muscle weakness of extremity   3. Unsteady gait   4. Other cervical disc degeneration, unspecified cervical region     Plan: With patient's progressively worsening symptoms that he admits to having since he was last seen by Dr. Lorin Mercy August 2017 I recommend getting a new lumbar spine MRI to compare to study that was done April 29, 2014.  Since he was last here he has also continued to have worsening balance issues/unsteady gait along with his back pain and feeling of lower extremity radiculopathy and states that this is been discussed with his primary care physician at [facility].  With his fairly significant degenerative changes of his cervical spine I will also get an MRI there to make sure he is not myelopathic.  I  will also make referral to neurology to get their input.    Follow-Up Instructions: Return in about 2 weeks (around 02/21/2018) for with dr yates to review lumbar MRI.   Orders:  Orders Placed This Encounter  Procedures  . XR Lumbar Spine 2-3 Views  . XR Cervical Spine 2 or 3 views  . MR Lumbar Spine w/o contrast  . MR Cervical Spine w/o contrast  . Ambulatory referral to Neurology   No orders of the defined types were placed in this encounter.     Procedures: No procedures performed   Clinical Data: No additional findings.   Subjective: Chief Complaint  Patient presents with  . Lower Back - Pain    HPI 57 year old black male comes in the office today with complaints of low back pain, bilateral lower extremity radiculopathy and worsening lower extremity weakness and unsteady gait.  Patient  was last seen in our office Dr. Lorin Mercy in 2017.  He had MRI lumbar spine April 29, 2014 along with thoracic spine MRI and those reports showed:  EXAM: MRI THORACIC SPINE WITHOUT AND WITH CONTRAST; MRI LUMBAR SPINE WITHOUT AND WITH CONTRAST  TECHNIQUE: Multiplanar and multiecho pulse sequences of the thoracic spine were obtained without and with intravenous contrast.; Multiplanar and multiecho pulse sequences of the lumbar spine were obtained without and with intravenous contrast.  CONTRAST:  3mL MULTIHANCE GADOBENATE DIMEGLUMINE 529 MG/ML IV SOLN  COMPARISON:  Prior MRI from 01/07/2014  FINDINGS: MRI THORACIC SPINE:  Vertebral bodies are normally aligned with preservation of the normal thoracic kyphosis. Vertebral body heights preserved. Signal intensity within the vertebral body bone marrow is normal. Signal intensity within the intervertebral disc spaces is normal as well. No abnormal enhancement to suggest infection. No epidural fluid collection.  Signal intensity within the thoracic spinal cord is within normal limits.  Paraspinous soft tissues within normal limits.  Tiny left foraminal protrusion at T4-5 without significant stenosis. Tiny right foraminal disc protrusion at T5-6 without significant stenosis. Small left paracentral disc protrusion at T7-8 with resultant mild flattening of the left hemi cord. No significant canal or foraminal stenosis.  MRI LUMBAR SPINE:  Study is limited as only post-contrast sagittal T1 weighted sequence  is provided.  Vertebral bodies are normally aligned with preservation of the normal lumbar lordosis. No listhesis. Signal intensity within the vertebral body bone marrow is grossly normal, although evaluation is limited on this exam.  Conus medullaris terminates normally.  Paraspinous soft tissues within normal limits.  L1-2:  L2-3:  L3-4:  L4-5: Grossly stable central disc protrusion with  mild-to-moderate facet and ligamentum flavum hypertrophy. Mild to moderate central canal stenosis with bilateral L5 nerve root impingement is not significantly changed.  L5-S1: Central/left paracentral disc protrusion stable. No significant canal stenosis. Mild bilateral foraminal narrowing related to facet hypertrophy is stable.  IMPRESSION: MRI THORACIC SPINE:  1. No evidence of cord compression. 2. No epidural fluid collection or abnormal enhancement to suggest active infection. 3. Small left paracentral disc protrusion at T7-8 with secondary mild flattening of the left hemi cord. 4. Additional tiny disc protrusions at T4-5 and T5-6 without significant stenosis as above.  MRI LUMBAR SPINE:  1. Limited study due to lack of all sequences available for review. No evidence of cord compression. 2. No epidural fluid collection or abnormal enhancement to suggest active infection. 3. Central disc protrusion with superimposed facet hypertrophy at L4-5 with resultant mild to moderate canal stenosis, not significantly changed relative to recent MRI. 4. Smaller central disc protrusion at L5-S1 without significant stenosis. Results were called by telephone at the time of interpretation on 04/30/2014 at 12:49 am to Dr. Pryor Curia , who verbally acknowledged these results.  He has had a couple of lumbar facet injections but states that these did not give any relief.  Since his visit with [name] in 2017 he has been followed by primary care physicians through [facility] occasionally and treated with pain medication and Flexeril.  He has not had any further imaging studies ordered through them or had any other referrals.  He was started on a pain contract by Dr. Janne Napoleon July 2017.  He also had an emergency room visit April 2018.  He complains of back pain with pain radiating down to both lower legs with numbness and tingling down to his feet as well.  Progressively worsening  lower extremity weakness and unsteady gait.  Ambulates with a cane.  After further questioning he also admits to having some left greater than right upper extremity weakness and intermittent numbness and tingling into his hands..  Some neck soreness but not too bad.  Patient also had emergency room visit January 20, 2018 when he suffered a 3 cm laceration to his right second toe.  He states that he was ambulating in his home and his right leg got weak and had trouble lifting his foot and he caught his toe on the rug causing the injury.  He denies true bowel or bladder incontinence although there is some question of some urinary issues and states that his primary care physician has recommended urology referral but he has not done so as of yet.   Review of Systems No complaints of fever chills cardiac pulmonary issues.  Objective: Vital Signs: BP (!) 154/102 (BP Location: Left Arm, Patient Position: Sitting, Cuff Size: Normal)   Pulse 85   Ht 6' (1.829 m)   Wt 155 lb (70.3 kg)   BMI 21.02 kg/m   Physical Exam  Constitutional: He is oriented to person, place, and time. No distress.  HENT:  Head: Normocephalic and atraumatic.  Eyes: Pupils are equal, round, and reactive to light. EOM are normal.  Neck: Normal range of motion.  Pulmonary/Chest: No respiratory distress.  Musculoskeletal:  Gait is unsteady with a cane.  Complains of leg weakness and some back discomfort when he goes to stand from his chair.  Bilateral lumbar paraspinal tenderness.  He does have some weakness with resistance throughout bilateral upper and lower extremities.  I cannot positively say whether this is related to poor effort or true weakness.  Negative straight leg raise.  Neurological: He is alert and oriented to person, place, and time.    Ortho Exam  Specialty Comments:  No specialty comments available.  Imaging: Xr Cervical Spine 2 Or 3 Views  Result Date: 02/07/2018 X-ray cervical spine AP lateral views show  fairly extensive multilevel cervical spondylosis with disc space collapse C3-C7 with large vertebral spurs.  May be a couple millimeters of C3 retrolisthesis on C4.  Straightening of the cervical lordosis.  Xr Lumbar Spine 2-3 Views  Result Date: 02/07/2018 X-rays lumbar spine show some mild lumbar degenerative disc disease.  No disc space collapse.  Some facet changes.  No acute finding.    PMFS History: Patient Active Problem List   Diagnosis Date Noted  . Tubular adenoma 05/16/2017  . Depressed mood 05/16/2017  . Substance abuse ([place]) 05/16/2017  . Hallux valgus 08/03/2016  . Disc disorder   . Lumbar facet arthropathy 08/06/2014  . Lumbar radiculopathy 08/06/2014  . ED (erectile dysfunction) 01/07/2014  . Unspecified vitamin D deficiency 01/07/2014  . Left shoulder pain 08/10/2013  . Chronic low back pain 08/10/2013  . Essential hypertension 08/10/2013  . Tobacco abuse 08/10/2013   Past Medical History:  Diagnosis Date  . Arthritis    back, shoulders   . Constipation   . Disc disorder   . Hypertension   . Lumbar herniated disc   . Muscle spasm     Family History  Problem Relation Age of Onset  . Diabetes Mother   . Hypertension Mother   . Heart disease Mother   . Hyperlipidemia Mother   . Diabetes Brother   . Diabetes Daughter   . Hypertension Sister   . Diabetes Sister   . Stomach cancer Maternal Grandfather   . Colon cancer Neg Hx   . Esophageal cancer Neg Hx   . Rectal cancer Neg Hx   . Colon polyps Neg Hx     Past Surgical History:  Procedure Laterality Date  . COLONOSCOPY    . FOOT SURGERY     right, ~ 2002  . POLYPECTOMY     Social History   Occupational History  . Not on file  Tobacco Use  . Smoking status: Current Every Day Smoker    Packs/day: 0.10    Years: 20.00    Pack years: 2.00    Types: Cigarettes  . Smokeless tobacco: Never Used  . Tobacco comment: 5 cigs a day   Substance and Sexual Activity  . Alcohol use: No  . Drug use:  No  . Sexual activity: Not on file

## 2018-02-14 ENCOUNTER — Encounter (INDEPENDENT_AMBULATORY_CARE_PROVIDER_SITE_OTHER): Payer: Self-pay | Admitting: Surgery

## 2018-02-14 NOTE — Progress Notes (Signed)
Today when I came to the office I went to check the status of patient's stat cervical MRI that I ordered February 07, 2018.  This was ordered due to progressive upper/lower extremity weakness and unsteady gait and severe multilevel cervical spondylosis that was seen on x-rays.  Amy from our office checked patient's chart and discovered that [facility] had contacted patient twice to schedule appointment to come in for scan and patient has not responded back to them.  I also did ask Amy to call patient today to encourage him to get back with [facility] imaging to have the study of his neck and he can follow-up after completion to discuss results and further treatment options with Dr. Lorin Mercy.  I was concerned the patient may have a cervical myelopathy.  When I last saw I did stress to  him the importance of getting the scan.

## 2018-02-20 ENCOUNTER — Encounter (HOSPITAL_BASED_OUTPATIENT_CLINIC_OR_DEPARTMENT_OTHER): Payer: Medicaid Other | Attending: Internal Medicine

## 2018-02-20 ENCOUNTER — Other Ambulatory Visit (HOSPITAL_COMMUNITY)
Admission: RE | Admit: 2018-02-20 | Discharge: 2018-02-20 | Disposition: A | Discharge status: Home / Self Care | Payer: Medicaid Other | Source: Other Acute Inpatient Hospital | Attending: Internal Medicine | Admitting: Internal Medicine

## 2018-02-20 DIAGNOSIS — M4726 Other spondylosis with radiculopathy, lumbar region: Secondary | ICD-10-CM | POA: Insufficient documentation

## 2018-02-20 DIAGNOSIS — I10 Essential (primary) hypertension: Secondary | ICD-10-CM | POA: Diagnosis not present

## 2018-02-20 DIAGNOSIS — L97513 Non-pressure chronic ulcer of other part of right foot with necrosis of muscle: Secondary | ICD-10-CM | POA: Diagnosis present

## 2018-02-20 DIAGNOSIS — Z87891 Personal history of nicotine dependence: Secondary | ICD-10-CM | POA: Diagnosis not present

## 2018-02-23 LAB — AEROBIC CULTURE W GRAM STAIN (SUPERFICIAL SPECIMEN): Gram Stain: NONE SEEN

## 2018-02-23 LAB — AEROBIC CULTURE  (SUPERFICIAL SPECIMEN)

## 2018-02-24 ENCOUNTER — Ambulatory Visit
Admission: RE | Admit: 2018-02-24 | Discharge: 2018-02-24 | Disposition: A | Discharge status: Home / Self Care | Payer: Medicaid Other | Source: Ambulatory Visit | Attending: Surgery | Admitting: Surgery

## 2018-02-24 DIAGNOSIS — R2681 Unsteadiness on feet: Secondary | ICD-10-CM

## 2018-02-24 DIAGNOSIS — G8929 Other chronic pain: Secondary | ICD-10-CM

## 2018-02-24 DIAGNOSIS — M5030 Other cervical disc degeneration, unspecified cervical region: Secondary | ICD-10-CM

## 2018-02-24 DIAGNOSIS — M5441 Lumbago with sciatica, right side: Principal | ICD-10-CM

## 2018-02-24 DIAGNOSIS — M6281 Muscle weakness (generalized): Secondary | ICD-10-CM

## 2018-02-24 DIAGNOSIS — M5442 Lumbago with sciatica, left side: Principal | ICD-10-CM

## 2018-02-27 ENCOUNTER — Telehealth (INDEPENDENT_AMBULATORY_CARE_PROVIDER_SITE_OTHER): Payer: Self-pay | Admitting: Radiology

## 2018-02-27 NOTE — Telephone Encounter (Signed)
I called patient to make him an appointment to see Jeneen Rinks tomorrow. He already has two appointments, but is thinking of cancelling one. He is going to check his schedule and call back by the end of the afternoon to schedule with Jeneen Rinks.

## 2018-02-27 NOTE — Telephone Encounter (Signed)
Received STAT call results from [facility] on patient's MRI Cervical Spine. The impression reads as follows:  Two separate areas of stenosis with cord compression, and abnormal cord signal are observed at C3-4 and C5-6, worse superiorly. The stenosis is related to protruding central disc material, posterior element hypertrophy, and ventral osseous spurring. Reversal of the cervical lordotic curve could be contributory as well. Correlate clinically for either or both as a contributor to cervical myelopathy.  Varying degrees of foraminal narrowing as described due to uncinate hypertrophy, facet disease and disc material. These are most notable at C2-3, C3-4, C5-6, and C6-7. Correlate clinically for radicular symptoms related to the upper extremity(ies).  T2 hyperintensity of the pons suggesting small vessel disease. Consider elective MRI of the brain further evaluation.  Studies were ordered by Benjiman Core, PA-C who is not in the office today.  Could you please advise?

## 2018-02-27 NOTE — Telephone Encounter (Signed)
Have Jeneen Rinks see patient tomorrow

## 2018-02-27 NOTE — Telephone Encounter (Signed)
Please see STAT call report below. Patient has follow up appt with you to review and discuss per Dr. Sharol Given tomorrow afternoon at 1:45p.

## 2018-02-28 ENCOUNTER — Encounter (HOSPITAL_BASED_OUTPATIENT_CLINIC_OR_DEPARTMENT_OTHER): Payer: Medicaid Other | Attending: Internal Medicine

## 2018-02-28 ENCOUNTER — Telehealth (INDEPENDENT_AMBULATORY_CARE_PROVIDER_SITE_OTHER): Payer: Self-pay | Admitting: *Deleted

## 2018-02-28 ENCOUNTER — Ambulatory Visit (INDEPENDENT_AMBULATORY_CARE_PROVIDER_SITE_OTHER): Payer: Medicaid Other | Admitting: Surgery

## 2018-02-28 ENCOUNTER — Ambulatory Visit: Payer: Medicaid Other | Admitting: Internal Medicine

## 2018-02-28 ENCOUNTER — Encounter (INDEPENDENT_AMBULATORY_CARE_PROVIDER_SITE_OTHER): Payer: Self-pay | Admitting: Surgery

## 2018-02-28 VITALS — BP 145/95 | HR 73 | Temp 98.1°F

## 2018-02-28 DIAGNOSIS — I10 Essential (primary) hypertension: Secondary | ICD-10-CM | POA: Diagnosis not present

## 2018-02-28 DIAGNOSIS — M6281 Muscle weakness (generalized): Secondary | ICD-10-CM

## 2018-02-28 DIAGNOSIS — R2681 Unsteadiness on feet: Secondary | ICD-10-CM

## 2018-02-28 DIAGNOSIS — L97516 Non-pressure chronic ulcer of other part of right foot with bone involvement without evidence of necrosis: Secondary | ICD-10-CM | POA: Insufficient documentation

## 2018-02-28 DIAGNOSIS — Z87891 Personal history of nicotine dependence: Secondary | ICD-10-CM | POA: Diagnosis not present

## 2018-02-28 DIAGNOSIS — M47816 Spondylosis without myelopathy or radiculopathy, lumbar region: Secondary | ICD-10-CM

## 2018-02-28 DIAGNOSIS — M4726 Other spondylosis with radiculopathy, lumbar region: Secondary | ICD-10-CM | POA: Diagnosis not present

## 2018-02-28 DIAGNOSIS — M4712 Other spondylosis with myelopathy, cervical region: Secondary | ICD-10-CM | POA: Diagnosis not present

## 2018-02-28 NOTE — Progress Notes (Signed)
Office Visit Note   Patient: Calvin [name]           Date of Birth: [birth year]           MRN: [id number] Visit Date: 02/28/2018              Requested by: Ladell Pier, MD [address] [place], [place] [address] PCP: Ladell Pier, MD   Assessment & Plan: Visit Diagnoses:  1. Other spondylosis with myelopathy, cervical region   2. Lumbar spondylosis   3. Muscle weakness of extremity   4. Unsteady gait     Plan: The only surgeon in our office today is Dr. Meridee Score.  I did review the cervical MRI report which documents areas of myelomalacia at C3-4 and C5-6.  I believe this is contributing to patient's progressive weakness that he has had in the lower extremities over the last several months.  Both of our spine surgeons out of town until next week.  Dr. Sharol Given and I did discuss referral out to another spine surgeon to get their opinion as to whether or not this should be addressed sooner than later.  I was able to get a hold of Dr. Phylliss Bob orthopedic spine surgeon and discussed with him the findings on the MRI.  He stated that he would see patient today in his clinic to review the study and discuss whether or not surgical intervention is indicated sooner than later.  Did attempt to contact patient after he left appointment with me today and had to leave a voicemail with his sister since his voicemail was full.  I had discussed with patient before he left today of my plan to speak with Dr. Lynann Bologna to get his view on things.  Patient was very appreciative of me doing that.  Follow-Up Instructions: Return in about 1 week (around 03/07/2018).   Orders:  No orders of the defined types were placed in this encounter.  No orders of the defined types were placed in this encounter.     Procedures: No procedures performed   Clinical Data: No additional findings.   Subjective: Chief Complaint  Patient presents with  . Lower Back - Follow-up  . Neck - Follow-up     HPI Patient returns for review of cervical and lumbar MRI scans.  Patient was last seen in the clinic by me February 07, 2018 and I had ordered stat imaging studies at that time due to my concern about possible cervical myelopathy.  Scans were finally performed February 24, 2018.  Cervical spine report read:  CLINICAL DATA:  Concern for cervical myelopathy. Worsening lower extremity weakness and unsteady gait.  EXAM: MRI CERVICAL SPINE WITHOUT CONTRAST  TECHNIQUE: Multiplanar, multisequence MR imaging of the cervical spine was performed. No intravenous contrast was administered.  COMPARISON:  Cervical spine plain films 02/07/2018.  FINDINGS: Alignment: Straightening, and slight reversal of the normal cervical lordosis.  Vertebrae: No worrisome osseous lesion, or fracture. No evidence of discitis.  Cord: Abnormal cord signal at C3-4 relates to central canal stenosis. The cord is flattened due to spinal stenosis at this level. Foci of myelomalacia are seen in the LEFT hemicord on series 10, image 10, and the RIGHT hemicord on series 10, image 9. These are confirmed on sagittal T2 and STIR images, see series 8, images 6 and 9.  Additional focus of cord injury is seen opposite the C6 vertebrae, related to stenosis at the C5-6 level, small focus of myelomalacia as seen on  series 10, image 22, and also confirmed on sagittal imaging.  Posterior Fossa, vertebral arteries, paraspinal tissues: No tonsillar herniation. Flow voids are maintained. No neck masses.  In the pons, there appears to be T2 hyperintensity suggesting chronic microvascular ischemic change.  Disc levels:  C2-3: Annular bulge. BILATERAL facet arthropathy, greater on the LEFT. LEFT C3 foraminal narrowing is likely.  C3-4: Disc space narrowing. Central protrusion with osseous spurring. Facet arthropathy with ligamentum flavum infolding. Moderate to severe stenosis. Cord flattening with abnormal  cord signal. BILATERAL C4 foraminal narrowing.  C4-5:  Annular bulge. Facet arthropathy. No definite impingement.  C5-6: Central protrusion. Osseous spurring/uncinate hypertrophy. Mild to moderate stenosis cord flattening with abnormal cord signal just caudal to the interspace on the RIGHT. BILATERAL RIGHT greater than LEFT C6 foraminal narrowing.  C6-7: Annular bulge. Uncinate spurring. BILATERAL C7 foraminal narrowing.  C7-T1: Trace anterolisthesis. Annular bulge. Facet arthropathy. No impingement.  IMPRESSION: Two separate areas of stenosis with cord compression, and abnormal cord signal are observed at C3-4 and C5-6, worse superiorly. The stenosis is related to protruding central disc material, posterior element hypertrophy, and ventral osseous spurring. Reversal of the cervical lordotic curve could be contributory as well. Correlate clinically for either or both as a contributor to cervical myelopathy.  Varying degrees of foraminal narrowing as described due to uncinate hypertrophy, facet disease and disc material. These are most notable at C2-3, C3-4, C5-6, and C6-7. Correlate clinically for radicular symptoms related to the upper extremity(ies).  T2 hyperintensity of the pons suggesting small vessel disease. Consider elective MRI of the brain further evaluation.  These results will be called to the ordering clinician or representative by the Radiologist Assistant, and communication documented in the PACS or zVision Dashboard.  Lumbar spine report read: CLINICAL DATA:  Low back pain. Worsening upper and lower extremity weakness.  EXAM: MRI LUMBAR SPINE WITHOUT CONTRAST  TECHNIQUE: Multiplanar, multisequence MR imaging of the lumbar spine was performed. No intravenous contrast was administered.  COMPARISON:  MRI lumbar spine 04/29/2014 and 10/27/2013.  MRI cervical spine reported separately.  FINDINGS: Segmentation:  Standard.  Alignment:   Anatomic.  Vertebrae:  No worrisome osseous lesion.  Conus medullaris and cauda equina: Conus extends to the L1 level. Conus and cauda equina appear normal.  Paraspinal and other soft tissues: Unremarkable  Disc levels:  L1-L2:  Normal.  L2-L3:  Normal.  L3-L4:  Normal.  L4-L5: Central protrusion with annular rent. Posterior element hypertrophy affecting facets and ligamentum flava. Mild to moderate stenosis. BILATERAL L5 nerve root impingement. Mild BILATERAL foraminal narrowing is present, not clearly compressive.  L5-S1: Shallow central protrusion. Facet arthropathy. No subarticular zone narrowing. BILATERAL foraminal narrowing could affect either L5 nerve root, although these changes are fairly mild.  Compared with 2015, protrusions at L5-S1 and L4-5 appears similar.  IMPRESSION: Mild to moderate stenosis at L4-5 relates to central protrusion and posterior element hypertrophy. Either L5 nerve root could be affected.  Shallow central protrusion L5-S1. No subarticular zone narrowing, but BILATERAL foraminal narrowing due to disc material and facet arthropathy could affect either L5 nerve root.  Review of Systems No current cardiac GI GU issues  Objective: Vital Signs: BP (!) 145/95 (BP Location: Right Arm, Patient Position: Sitting)   Pulse 73   Temp 98.1 F (36.7 C) (Oral)   Physical Exam  Constitutional: He is oriented to person, place, and time. No distress.  HENT:  Head: Normocephalic and atraumatic.  Eyes: Pupils are equal, round, and reactive to light. EOM are  normal.  Pulmonary/Chest: No respiratory distress.  Musculoskeletal:  Gait unsteady with a cane.  Neurological: He is alert and oriented to person, place, and time.  Psychiatric: He has a normal mood and affect.    Ortho Exam  Specialty Comments:  No specialty comments available.  Imaging: No results found.   PMFS History: Patient Active Problem List   Diagnosis Date Noted   . Tubular adenoma 05/16/2017  . Depressed mood 05/16/2017  . Substance abuse ([place]) 05/16/2017  . Hallux valgus 08/03/2016  . Disc disorder   . Lumbar facet arthropathy 08/06/2014  . Lumbar radiculopathy 08/06/2014  . ED (erectile dysfunction) 01/07/2014  . Unspecified vitamin D deficiency 01/07/2014  . Left shoulder pain 08/10/2013  . Chronic low back pain 08/10/2013  . Essential hypertension 08/10/2013  . Tobacco abuse 08/10/2013   Past Medical History:  Diagnosis Date  . Arthritis    back, shoulders   . Constipation   . Disc disorder   . Hypertension   . Lumbar herniated disc   . Muscle spasm     Family History  Problem Relation Age of Onset  . Diabetes Mother   . Hypertension Mother   . Heart disease Mother   . Hyperlipidemia Mother   . Diabetes Brother   . Diabetes Daughter   . Hypertension Sister   . Diabetes Sister   . Stomach cancer Maternal Grandfather   . Colon cancer Neg Hx   . Esophageal cancer Neg Hx   . Rectal cancer Neg Hx   . Colon polyps Neg Hx     Past Surgical History:  Procedure Laterality Date  . COLONOSCOPY    . FOOT SURGERY     right, ~ 2002  . POLYPECTOMY     Social History   Occupational History  . Not on file  Tobacco Use  . Smoking status: Current Every Day Smoker    Packs/day: 0.10    Years: 20.00    Pack years: 2.00    Types: Cigarettes  . Smokeless tobacco: Never Used  . Tobacco comment: 5 cigs a day   Substance and Sexual Activity  . Alcohol use: No  . Drug use: No  . Sexual activity: Not on file

## 2018-03-01 ENCOUNTER — Encounter (INDEPENDENT_AMBULATORY_CARE_PROVIDER_SITE_OTHER): Payer: Self-pay | Admitting: Surgery

## 2018-03-01 NOTE — Progress Notes (Signed)
I just had a phone conversation with Calvin [name] in regards to his cervical spine issues.  He did contact Dr. Elta Guadeloupe Dumonski's office and has an appointment with him at 1:30 PM today.  I  advised patient to contact me if he has any questions or concerns after that appointment.

## 2018-03-02 ENCOUNTER — Emergency Department (HOSPITAL_COMMUNITY): Payer: Medicaid Other

## 2018-03-02 ENCOUNTER — Ambulatory Visit (HOSPITAL_COMMUNITY)
Admission: RE | Admit: 2018-03-02 | Discharge: 2018-03-02 | Disposition: A | Discharge status: Home / Self Care | Payer: Medicaid Other | Attending: Geriatric Medicine | Admitting: Geriatric Medicine

## 2018-03-02 ENCOUNTER — Ambulatory Visit (HOSPITAL_COMMUNITY)
Admission: RE | Admit: 2018-03-02 | Discharge: 2018-03-02 | Disposition: A | Discharge status: Home / Self Care | Payer: Medicaid Other | Source: Ambulatory Visit | Attending: Physician Assistant | Admitting: Physician Assistant

## 2018-03-02 ENCOUNTER — Ambulatory Visit: Payer: Medicaid Other | Admitting: Internal Medicine

## 2018-03-02 ENCOUNTER — Emergency Department (HOSPITAL_COMMUNITY): Admission: EM | Admit: 2018-03-02 | Payer: Medicaid Other | Source: Home / Self Care

## 2018-03-02 ENCOUNTER — Other Ambulatory Visit: Payer: Self-pay | Admitting: Physician Assistant

## 2018-03-02 ENCOUNTER — Encounter (HOSPITAL_COMMUNITY): Payer: Self-pay | Admitting: Emergency Medicine

## 2018-03-02 DIAGNOSIS — M7989 Other specified soft tissue disorders: Secondary | ICD-10-CM | POA: Diagnosis not present

## 2018-03-02 DIAGNOSIS — T148XXD Other injury of unspecified body region, subsequent encounter: Secondary | ICD-10-CM

## 2018-03-02 DIAGNOSIS — X58XXXD Exposure to other specified factors, subsequent encounter: Secondary | ICD-10-CM | POA: Insufficient documentation

## 2018-03-02 DIAGNOSIS — S99921D Unspecified injury of right foot, subsequent encounter: Secondary | ICD-10-CM | POA: Diagnosis not present

## 2018-03-02 NOTE — ED Triage Notes (Signed)
Pt states he had a joint fusion on his right second toe in 2003. Pt states approx a month ago he scraped that toe and it has a "gash" and is slightly disfigured. Pt was told by his MD to have an xray

## 2018-03-02 NOTE — ED Notes (Signed)
Pt was meant to have outpatient xray.  Presented to ED with hand written order.  Registration and Radiology conversing about how to resolve charts for patient.

## 2018-03-03 MED FILL — $VIAGRA 100 MG TABLET: 100 | 30 days supply | Qty: 10 | Fill #5

## 2018-03-03 MED FILL — CYCLOBENZAPRINE 5 MG TABLET: 5 | 30 days supply | Qty: 60 | Fill #0

## 2018-03-06 ENCOUNTER — Encounter: Payer: Self-pay | Admitting: Internal Medicine

## 2018-03-06 ENCOUNTER — Other Ambulatory Visit: Payer: Self-pay | Admitting: Orthopedic Surgery

## 2018-03-06 ENCOUNTER — Ambulatory Visit: Payer: Medicaid Other | Attending: Internal Medicine | Admitting: Internal Medicine

## 2018-03-06 VITALS — BP 148/105 | HR 85 | Temp 97.9°F | Resp 16 | Wt 149.4 lb

## 2018-03-06 DIAGNOSIS — X58XXXA Exposure to other specified factors, initial encounter: Secondary | ICD-10-CM | POA: Insufficient documentation

## 2018-03-06 DIAGNOSIS — F1910 Other psychoactive substance abuse, uncomplicated: Secondary | ICD-10-CM

## 2018-03-06 DIAGNOSIS — I10 Essential (primary) hypertension: Secondary | ICD-10-CM | POA: Diagnosis not present

## 2018-03-06 DIAGNOSIS — N529 Male erectile dysfunction, unspecified: Secondary | ICD-10-CM | POA: Diagnosis not present

## 2018-03-06 DIAGNOSIS — Z8249 Family history of ischemic heart disease and other diseases of the circulatory system: Secondary | ICD-10-CM | POA: Diagnosis not present

## 2018-03-06 DIAGNOSIS — G959 Disease of spinal cord, unspecified: Secondary | ICD-10-CM | POA: Insufficient documentation

## 2018-03-06 DIAGNOSIS — Z981 Arthrodesis status: Secondary | ICD-10-CM | POA: Insufficient documentation

## 2018-03-06 DIAGNOSIS — E119 Type 2 diabetes mellitus without complications: Secondary | ICD-10-CM | POA: Diagnosis not present

## 2018-03-06 DIAGNOSIS — M545 Low back pain: Secondary | ICD-10-CM | POA: Diagnosis not present

## 2018-03-06 DIAGNOSIS — Z720 Tobacco use: Secondary | ICD-10-CM

## 2018-03-06 DIAGNOSIS — F17210 Nicotine dependence, cigarettes, uncomplicated: Secondary | ICD-10-CM | POA: Diagnosis not present

## 2018-03-06 DIAGNOSIS — S92911A Unspecified fracture of right toe(s), initial encounter for closed fracture: Secondary | ICD-10-CM | POA: Insufficient documentation

## 2018-03-06 DIAGNOSIS — Z86010 Personal history of colonic polyps: Secondary | ICD-10-CM | POA: Diagnosis not present

## 2018-03-06 DIAGNOSIS — Z833 Family history of diabetes mellitus: Secondary | ICD-10-CM | POA: Insufficient documentation

## 2018-03-06 DIAGNOSIS — F329 Major depressive disorder, single episode, unspecified: Secondary | ICD-10-CM | POA: Insufficient documentation

## 2018-03-06 DIAGNOSIS — G8929 Other chronic pain: Secondary | ICD-10-CM | POA: Diagnosis not present

## 2018-03-06 DIAGNOSIS — Z79899 Other long term (current) drug therapy: Secondary | ICD-10-CM | POA: Insufficient documentation

## 2018-03-06 MED ORDER — AMLODIPINE BESYLATE 10 MG PO TABS: 10.0000 mg | ORAL_TABLET | Freq: Every day | ORAL | 3 refills | Status: DC

## 2018-03-06 MED ORDER — HYDROCHLOROTHIAZIDE 25 MG PO TABS: 25.0000 mg | ORAL_TABLET | Freq: Every day | ORAL | 3 refills | Status: DC

## 2018-03-06 MED FILL — HYDROCHLOROTHIAZIDE 25 MG T: 25 | 30 days supply | Qty: 30 | Fill #0

## 2018-03-06 MED FILL — AMLODIPINE BESYLATE 10 MG T: 10 | 30 days supply | Qty: 30 | Fill #0

## 2018-03-06 NOTE — Patient Instructions (Signed)
Please restart your blood pressure medications.  Prescriptions sent to pharmacy.

## 2018-03-06 NOTE — Progress Notes (Signed)
Patient ID: Calvin [name], male    DOB: 09-Jan-1961  MRN: [id number]  CC: Diabetes and Hypertension   Subjective: Calvin [name] is a 57 y.o. male who presents for chronic ds management His concerns today include:  Pt with hx of HTN, chronic LBP, ED, tobacco, cocaine use, depression  Since last visit with me he has seen orthopedics for his lower back pain and leg weakness.  Had MRI of the cervical spine that revealed cord compression at C3-4 and C5-6.  MRI of the lumbar spine showed some mild to moderate stenosis at L4-5 and shallow central protrusion at L5-S1.  He has seen a neurosurgeon and plan is for decompressive surgery on the cervical spine.  Patient states that it is tentatively planned for later this week provided to get approval from [facility].    Was approved for disability  HTN:  No CP/SOB at rest on exertion.  He is not very active due to his chronic back issues and weakness in the legs.  On last visit amlodipine and HCTZ were put on hold as blood pressure was good with him being off of the medicines for several weeks.  Colon cancer screen: He has not called GI to reschedule repeat colonoscopy.  He states that he will do so after his neck surgery.    Complain of sustained injury to the right second toe in June of this year.  Toe was caught on a rug and was flexed backward.  He sustained a laceration.  Was seen in the emergency room.  Referred to wound clinic and is being seen at [facility] long wound clinic 3 times a week.  Recent imaging done of the foot reveals that he has a healing fracture of the second toe.  Had surgery on RT 2 toe in 2003. Fusion was done.  Tob: done 2-3/cig a day No street drugs in 3 mths Patient Active Problem List   Diagnosis Date Noted  . Tubular adenoma 05/16/2017  . Depressed mood 05/16/2017  . Substance abuse ([place]) 05/16/2017  . Hallux valgus 08/03/2016  . Disc disorder   . Lumbar facet arthropathy 08/06/2014  . Lumbar radiculopathy 08/06/2014  . ED  (erectile dysfunction) 01/07/2014  . Unspecified vitamin D deficiency 01/07/2014  . Left shoulder pain 08/10/2013  . Chronic low back pain 08/10/2013  . Essential hypertension 08/10/2013  . Tobacco abuse 08/10/2013     Current Outpatient Medications on File Prior to Visit  Medication Sig Dispense Refill  . cyclobenzaprine (FLEXERIL) 5 MG tablet Take 1 tablet (5 mg total) by mouth 2 (two) times daily as needed for muscle spasms. MUST MAKE APPT FOR FURTHER REFILLS 40 tablet 0  . sildenafil (VIAGRA) 100 MG tablet 1 tab PO 1/2 hr before intercourse PRN.  Limit use to 1 tab /24 hr period. 30 tablet 3  . Aspirin-Salicylamide-Caffeine (BC FAST PAIN RELIEF) 650-195-33.3 MG PACK Take 1 Package by mouth every 6 (six) hours as needed (headaches).    . DULoxetine (CYMBALTA) 30 MG capsule 1 tab PO daily x 3 wks then 2 tabs daily. (Patient not taking: Reported on 03/06/2018) 60 capsule 2  . ibuprofen (ADVIL,MOTRIN) 200 MG tablet Take 1,200 mg by mouth every 6 (six) hours as needed.     No current facility-administered medications on file prior to visit.     No Known Allergies  Social History   Socioeconomic History  . Marital status: Single    Spouse name: Not on file  . Number of children: Not on  file  . Years of education: Not on file  . Highest education level: Not on file  Occupational History  . Not on file  Social Needs  . Financial resource strain: Not on file  . Food insecurity:    Worry: Not on file    Inability: Not on file  . Transportation needs:    Medical: Not on file    Non-medical: Not on file  Tobacco Use  . Smoking status: Current Every Day Smoker    Packs/day: 0.10    Years: 20.00    Pack years: 2.00    Types: Cigarettes  . Smokeless tobacco: Never Used  . Tobacco comment: 5 cigs a day   Substance and Sexual Activity  . Alcohol use: No  . Drug use: No  . Sexual activity: Not on file  Lifestyle  . Physical activity:    Days per week: Not on file    Minutes per  session: Not on file  . Stress: Not on file  Relationships  . Social connections:    Talks on phone: Not on file    Gets together: Not on file    Attends religious service: Not on file    Active member of club or organization: Not on file    Attends meetings of clubs or organizations: Not on file    Relationship status: Not on file  . Intimate partner violence:    Fear of current or ex partner: Not on file    Emotionally abused: Not on file    Physically abused: Not on file    Forced sexual activity: Not on file  Other Topics Concern  . Not on file  Social History Narrative  . Not on file    Family History  Problem Relation Age of Onset  . Diabetes Mother   . Hypertension Mother   . Heart disease Mother   . Hyperlipidemia Mother   . Diabetes Brother   . Diabetes Daughter   . Hypertension Sister   . Diabetes Sister   . Stomach cancer Maternal Grandfather   . Colon cancer Neg Hx   . Esophageal cancer Neg Hx   . Rectal cancer Neg Hx   . Colon polyps Neg Hx     Past Surgical History:  Procedure Laterality Date  . COLONOSCOPY    . FOOT SURGERY     right, ~ 2002  . POLYPECTOMY      ROS: Review of Systems Negative except as stated above PHYSICAL EXAM: BP (!) 148/105 (BP Location: Left Arm, Cuff Size: Normal) Comment: recheck  Pulse 85   Temp 97.9 F (36.6 C) (Oral)   Resp 16   Wt 149 lb 6.4 oz (67.8 kg)   SpO2 96%   BMI 20.26 kg/m   Physical Exam  General appearance - alert, well appearing, and in no distress Mental status - normal mood, behavior, speech, dress, motor activity, and thought processes Neck - supple, no significant adenopathy Chest - clear to auscultation, no wheezes, rales or rhonchi, symmetric air entry Heart - normal rate, regular rhythm, normal S1, S2, no murmurs, rubs, clicks or gallops Musculoskeletal -RT foot:  2 nd toe -mild edema.  No erythema.  Decreased sensation.  The toe is very flexible extremities - peripheral pulses normal, no  pedal edema, no clubbing or cyanosis  EKG: Normal sinus rhythm.  No acute ischemic changes.  Left anterior fascicular block  ASSESSMENT AND PLAN: 1. Essential hypertension Not at goal.  Restart Norvasc and HCTZ.  Advised to limit salt in the foods. - amLODipine (NORVASC) 10 MG tablet; Take 1 tablet (10 mg total) by mouth daily.  Dispense: 90 tablet; Refill: 3 - hydrochlorothiazide (HYDRODIURIL) 25 MG tablet; Take 1 tablet (25 mg total) by mouth daily.  Dispense: 90 tablet; Refill: 3 - EKG 12-Lead  2. Cervical myelopathy ([place]) Plan for surgery on the cervical spine.  3. Closed displaced fracture of phalanx of toe of right foot, unspecified toe, initial encounter -I think he needs to see podiatry to have this toe evaluated further.  He may need additional surgery on this toe - Ambulatory referral to Podiatry  4. Tobacco abuse Advised to quit.  He has been cutting back.  5. Substance abuse ([place]) Commended him of being drug-free for 3 months.  Advised to continue to abstain.   Patient was given the opportunity to ask questions.  Patient verbalized understanding of the plan and was able to repeat key elements of the plan.   Orders Placed This Encounter  Procedures  . Ambulatory referral to Podiatry  . EKG 12-Lead     Requested Prescriptions   Signed Prescriptions Disp Refills  . amLODipine (NORVASC) 10 MG tablet 90 tablet 3    Sig: Take 1 tablet (10 mg total) by mouth daily.  . hydrochlorothiazide (HYDRODIURIL) 25 MG tablet 90 tablet 3    Sig: Take 1 tablet (25 mg total) by mouth daily.    Return in about 3 months (around 06/06/2018).  Karle Plumber, MD, FACP

## 2018-03-07 ENCOUNTER — Ambulatory Visit (INDEPENDENT_AMBULATORY_CARE_PROVIDER_SITE_OTHER): Payer: Medicaid Other | Admitting: Orthopaedic Surgery

## 2018-03-08 ENCOUNTER — Emergency Department (HOSPITAL_COMMUNITY): Payer: Medicaid Other

## 2018-03-08 ENCOUNTER — Other Ambulatory Visit: Payer: Self-pay

## 2018-03-08 ENCOUNTER — Emergency Department (HOSPITAL_COMMUNITY): Payer: Medicaid Other | Admitting: Certified Registered Nurse Anesthetist

## 2018-03-08 ENCOUNTER — Encounter (HOSPITAL_COMMUNITY)
Admission: RE | Disposition: A | Discharge status: Nursing Home (Includes ICF) | Payer: Self-pay | Source: Ambulatory Visit | Attending: Orthopedic Surgery

## 2018-03-08 ENCOUNTER — Observation Stay (HOSPITAL_COMMUNITY)
Admission: EM | Admit: 2018-03-08 | Discharge: 2018-03-09 | Disposition: A | Discharge status: Home / Self Care | Payer: Medicaid Other | Attending: Orthopedic Surgery | Admitting: Orthopedic Surgery

## 2018-03-08 ENCOUNTER — Encounter (HOSPITAL_COMMUNITY)
Admission: EM | Disposition: A | Discharge status: Home / Self Care | Payer: Self-pay | Source: Home / Self Care | Attending: Emergency Medicine

## 2018-03-08 ENCOUNTER — Ambulatory Visit (HOSPITAL_COMMUNITY)
Admission: RE | Admit: 2018-03-08 | Discharge: 2018-03-08 | Disposition: A | Discharge status: Nursing Home (Includes ICF) | Payer: Medicaid Other | Source: Ambulatory Visit | Attending: Orthopedic Surgery | Admitting: Orthopedic Surgery

## 2018-03-08 ENCOUNTER — Encounter (HOSPITAL_COMMUNITY): Payer: Self-pay | Admitting: Emergency Medicine

## 2018-03-08 DIAGNOSIS — I10 Essential (primary) hypertension: Secondary | ICD-10-CM | POA: Insufficient documentation

## 2018-03-08 DIAGNOSIS — G9589 Other specified diseases of spinal cord: Secondary | ICD-10-CM | POA: Insufficient documentation

## 2018-03-08 DIAGNOSIS — M1990 Unspecified osteoarthritis, unspecified site: Secondary | ICD-10-CM | POA: Insufficient documentation

## 2018-03-08 DIAGNOSIS — Z419 Encounter for procedure for purposes other than remedying health state, unspecified: Secondary | ICD-10-CM

## 2018-03-08 DIAGNOSIS — M4802 Spinal stenosis, cervical region: Principal | ICD-10-CM | POA: Insufficient documentation

## 2018-03-08 DIAGNOSIS — M5431 Sciatica, right side: Secondary | ICD-10-CM

## 2018-03-08 DIAGNOSIS — F17210 Nicotine dependence, cigarettes, uncomplicated: Secondary | ICD-10-CM | POA: Diagnosis not present

## 2018-03-08 DIAGNOSIS — M5432 Sciatica, left side: Secondary | ICD-10-CM

## 2018-03-08 DIAGNOSIS — M5116 Intervertebral disc disorders with radiculopathy, lumbar region: Secondary | ICD-10-CM | POA: Diagnosis not present

## 2018-03-08 DIAGNOSIS — M5410 Radiculopathy, site unspecified: Secondary | ICD-10-CM | POA: Diagnosis present

## 2018-03-08 HISTORY — PX: ANTERIOR CERVICAL DECOMP/DISCECTOMY FUSION: SHX1161

## 2018-03-08 LAB — BASIC METABOLIC PANEL
Anion gap: 8 (ref 5–15)
BUN: 13 mg/dL (ref 6–20)
CO2: 27 mmol/L (ref 22–32)
Calcium: 9.2 mg/dL (ref 8.9–10.3)
Chloride: 101 mmol/L (ref 98–111)
Creatinine, Ser: 1.07 mg/dL (ref 0.61–1.24)
GFR calc Af Amer: >60 mL/min (ref >60)
GFR calc non Af Amer: >60 mL/min (ref >60)
Glucose, Bld: 82 mg/dL (ref 70–99)
Potassium: 4.2 mmol/L (ref 3.5–5.1)
Sodium: 136 mmol/L (ref 135–145)

## 2018-03-08 LAB — CBC WITH DIFFERENTIAL/PLATELET
ABS IMMATURE GRANULOCYTES: 0 10*3/uL (ref 0.0–0.1)
Basophils Absolute: 0.1 10*3/uL (ref 0.0–0.1)
Basophils Relative: 1 %
Eosinophils Absolute: 0.2 10*3/uL (ref 0.0–0.7)
Eosinophils Relative: 2 %
HCT: 42.2 % (ref 39.0–52.0)
Hemoglobin: 13.7 g/dL (ref 13.0–17.0)
IMMATURE GRANULOCYTES: 0 %
Lymphocytes Relative: 41 %
Lymphs Abs: 3.1 10*3/uL (ref 0.7–4.0)
MCH: 27.3 pg (ref 26.0–34.0)
MCHC: 32.5 g/dL (ref 30.0–36.0)
MCV: 84.1 fL (ref 78.0–100.0)
MONOS PCT: 9 %
Monocytes Absolute: 0.7 10*3/uL (ref 0.1–1.0)
NEUTROS PCT: 47 %
Neutro Abs: 3.5 10*3/uL (ref 1.7–7.7)
Platelets: 228 10*3/uL (ref 150–400)
RBC: 5.02 MIL/uL (ref 4.22–5.81)
RDW: 13.9 % (ref 11.5–15.5)
WBC: 7.5 10*3/uL (ref 4.0–10.5)

## 2018-03-08 LAB — TYPE AND SCREEN
ABO/RH(D): A POS
Antibody Screen: NEGATIVE

## 2018-03-08 LAB — SURGICAL PCR SCREEN
MRSA, PCR: NEGATIVE
Staphylococcus aureus: NEGATIVE

## 2018-03-08 LAB — ABO/RH: ABO/RH(D): A POS

## 2018-03-08 SURGERY — ANTERIOR CERVICAL DECOMPRESSION/DISCECTOMY FUSION 2 LEVELS
Anesthesia: General | Site: Neck

## 2018-03-08 SURGERY — ANTERIOR CERVICAL DECOMPRESSION/DISCECTOMY FUSION 4 LEVELS
Anesthesia: General

## 2018-03-08 MED ORDER — FENTANYL CITRATE (PF) 250 MCG/5ML IJ SOLN
INTRAMUSCULAR | Status: DC | PRN
  Administered 2018-03-08: 50 ug via INTRAVENOUS
  Administered 2018-03-08: 100 ug via INTRAVENOUS
  Administered 2018-03-08 (×2): 50 ug via INTRAVENOUS
  Administered 2018-03-08: 100 ug via INTRAVENOUS

## 2018-03-08 MED ORDER — BUPIVACAINE-EPINEPHRINE (PF) 0.25% -1:200000 IJ SOLN
INTRAMUSCULAR | Status: AC
  Filled 2018-03-08: qty 30

## 2018-03-08 MED ORDER — ACETAMINOPHEN 325 MG PO TABS: 650.0000 mg | ORAL_TABLET | ORAL | Status: DC | PRN

## 2018-03-08 MED ORDER — CEFAZOLIN SODIUM-DEXTROSE 2-4 GM/100ML-% IV SOLN
2.0000 g | Freq: Three times a day (TID) | INTRAVENOUS | Status: AC
  Administered 2018-03-08 – 2018-03-09 (×2): 2 g via INTRAVENOUS
  Filled 2018-03-08 (×3): qty 100

## 2018-03-08 MED ORDER — DEXAMETHASONE SODIUM PHOSPHATE 10 MG/ML IJ SOLN: 10.0000 mg | Freq: Once | INTRAMUSCULAR | Status: DC

## 2018-03-08 MED ORDER — LIDOCAINE HCL (CARDIAC) PF 100 MG/5ML IV SOSY
PREFILLED_SYRINGE | INTRAVENOUS | Status: DC | PRN
  Administered 2018-03-08: 80 mg via INTRATRACHEAL

## 2018-03-08 MED ORDER — SUGAMMADEX SODIUM 200 MG/2ML IV SOLN
INTRAVENOUS | Status: DC | PRN
  Administered 2018-03-08: 200 mg via INTRAVENOUS

## 2018-03-08 MED ORDER — MIDAZOLAM HCL 2 MG/2ML IJ SOLN
INTRAMUSCULAR | Status: AC
  Filled 2018-03-08: qty 2

## 2018-03-08 MED ORDER — MIDAZOLAM HCL 2 MG/2ML IJ SOLN
INTRAMUSCULAR | Status: DC | PRN
  Administered 2018-03-08 (×2): 1 mg via INTRAVENOUS

## 2018-03-08 MED ORDER — ONDANSETRON HCL 4 MG/2ML IJ SOLN
4.0000 mg | Freq: Once | INTRAMUSCULAR | Status: AC
  Administered 2018-03-08: 4 mg via INTRAVENOUS
  Filled 2018-03-08: qty 2

## 2018-03-08 MED ORDER — ZOLPIDEM TARTRATE 5 MG PO TABS: 5.0000 mg | ORAL_TABLET | Freq: Every evening | ORAL | Status: DC | PRN

## 2018-03-08 MED ORDER — MENTHOL 3 MG MT LOZG: 1.0000 | LOZENGE | OROMUCOSAL | Status: DC | PRN

## 2018-03-08 MED ORDER — CEFAZOLIN SODIUM-DEXTROSE 2-4 GM/100ML-% IV SOLN
INTRAVENOUS | Status: AC
  Filled 2018-03-08: qty 100

## 2018-03-08 MED ORDER — CEFAZOLIN SODIUM-DEXTROSE 2-4 GM/100ML-% IV SOLN
2.0000 g | INTRAVENOUS | Status: AC
  Administered 2018-03-08: 2 g via INTRAVENOUS

## 2018-03-08 MED ORDER — ONDANSETRON HCL 4 MG/2ML IJ SOLN: 4.0000 mg | Freq: Four times a day (QID) | INTRAMUSCULAR | Status: DC | PRN

## 2018-03-08 MED ORDER — FENTANYL CITRATE (PF) 250 MCG/5ML IJ SOLN
INTRAMUSCULAR | Status: AC
  Filled 2018-03-08: qty 5

## 2018-03-08 MED ORDER — AMLODIPINE BESYLATE 5 MG PO TABS
10.0000 mg | ORAL_TABLET | Freq: Every day | ORAL | Status: DC
  Administered 2018-03-09: 10 mg via ORAL
  Filled 2018-03-08: qty 2

## 2018-03-08 MED ORDER — ONDANSETRON HCL 4 MG/2ML IJ SOLN
INTRAMUSCULAR | Status: AC
  Filled 2018-03-08: qty 2

## 2018-03-08 MED ORDER — ROCURONIUM BROMIDE 100 MG/10ML IV SOLN
INTRAVENOUS | Status: DC | PRN
  Administered 2018-03-08: 30 mg via INTRAVENOUS
  Administered 2018-03-08: 20 mg via INTRAVENOUS
  Administered 2018-03-08: 50 mg via INTRAVENOUS

## 2018-03-08 MED ORDER — PROPOFOL 10 MG/ML IV BOLUS
INTRAVENOUS | Status: AC
  Filled 2018-03-08: qty 20

## 2018-03-08 MED ORDER — LACTATED RINGERS IV SOLN
INTRAVENOUS | Status: DC | PRN
  Administered 2018-03-08 (×2): via INTRAVENOUS

## 2018-03-08 MED ORDER — DOCUSATE SODIUM 100 MG PO CAPS
100.0000 mg | ORAL_CAPSULE | Freq: Two times a day (BID) | ORAL | Status: DC
  Administered 2018-03-08 – 2018-03-09 (×2): 100 mg via ORAL
  Filled 2018-03-08 (×2): qty 1

## 2018-03-08 MED ORDER — SODIUM CHLORIDE 0.9 % IV SOLN: 250.0000 mL | INTRAVENOUS | Status: DC

## 2018-03-08 MED ORDER — LACTATED RINGERS IV SOLN
INTRAVENOUS | Status: DC
  Administered 2018-03-08: 12:00:00 via INTRAVENOUS

## 2018-03-08 MED ORDER — PANTOPRAZOLE SODIUM 40 MG PO TBEC
40.0000 mg | DELAYED_RELEASE_TABLET | Freq: Every day | ORAL | Status: DC
  Administered 2018-03-09: 40 mg via ORAL
  Filled 2018-03-08 (×2): qty 1

## 2018-03-08 MED ORDER — FENTANYL CITRATE (PF) 100 MCG/2ML IJ SOLN
25.0000 ug | INTRAMUSCULAR | Status: DC | PRN
  Administered 2018-03-08 (×2): 25 ug via INTRAVENOUS

## 2018-03-08 MED ORDER — ACETAMINOPHEN 650 MG RE SUPP: 650.0000 mg | RECTAL | Status: DC | PRN

## 2018-03-08 MED ORDER — METOPROLOL TARTRATE 5 MG/5ML IV SOLN
INTRAVENOUS | Status: DC | PRN
  Administered 2018-03-08: 2 mg via INTRAVENOUS

## 2018-03-08 MED ORDER — PROMETHAZINE HCL 25 MG/ML IJ SOLN: 6.2500 mg | INTRAMUSCULAR | Status: DC | PRN

## 2018-03-08 MED ORDER — HYDROCHLOROTHIAZIDE 25 MG PO TABS
25.0000 mg | ORAL_TABLET | Freq: Every day | ORAL | Status: DC
  Administered 2018-03-09: 25 mg via ORAL
  Filled 2018-03-08: qty 1

## 2018-03-08 MED ORDER — PHENOL 1.4 % MT LIQD
1.0000 | OROMUCOSAL | Status: DC | PRN
  Administered 2018-03-09: 1 via OROMUCOSAL
  Filled 2018-03-08: qty 177

## 2018-03-08 MED ORDER — THROMBIN (RECOMBINANT) 20000 UNITS EX SOLR
CUTANEOUS | Status: AC
  Filled 2018-03-08: qty 20000

## 2018-03-08 MED ORDER — SODIUM CHLORIDE 0.9 % IV SOLN
INTRAVENOUS | Status: DC | PRN
  Administered 2018-03-08: 25 ug/min via INTRAVENOUS

## 2018-03-08 MED ORDER — BISACODYL 5 MG PO TBEC: 5.0000 mg | DELAYED_RELEASE_TABLET | Freq: Every day | ORAL | Status: DC | PRN

## 2018-03-08 MED ORDER — DEXAMETHASONE SODIUM PHOSPHATE 10 MG/ML IJ SOLN
INTRAMUSCULAR | Status: AC
  Filled 2018-03-08: qty 1

## 2018-03-08 MED ORDER — FLEET ENEMA 7-19 GM/118ML RE ENEM: 1.0000 | ENEMA | Freq: Once | RECTAL | Status: DC | PRN

## 2018-03-08 MED ORDER — MORPHINE SULFATE (PF) 4 MG/ML IV SOLN
4.0000 mg | Freq: Once | INTRAVENOUS | Status: AC
  Administered 2018-03-08: 4 mg via INTRAVENOUS
  Filled 2018-03-08: qty 1

## 2018-03-08 MED ORDER — SENNOSIDES-DOCUSATE SODIUM 8.6-50 MG PO TABS: 1.0000 | ORAL_TABLET | Freq: Every evening | ORAL | Status: DC | PRN

## 2018-03-08 MED ORDER — ARTIFICIAL TEARS OPHTHALMIC OINT
TOPICAL_OINTMENT | OPHTHALMIC | Status: DC | PRN
  Administered 2018-03-08: 1 via OPHTHALMIC

## 2018-03-08 MED ORDER — ALBUMIN HUMAN 5 % IV SOLN
INTRAVENOUS | Status: DC | PRN
  Administered 2018-03-08 (×2): via INTRAVENOUS

## 2018-03-08 MED ORDER — BUPIVACAINE-EPINEPHRINE 0.25% -1:200000 IJ SOLN
INTRAMUSCULAR | Status: DC | PRN
  Administered 2018-03-08: 7 mL

## 2018-03-08 MED ORDER — DEXAMETHASONE SODIUM PHOSPHATE 10 MG/ML IJ SOLN
10.0000 mg | Freq: Once | INTRAMUSCULAR | Status: AC
  Administered 2018-03-08: 10 mg via INTRAVENOUS
  Filled 2018-03-08: qty 1

## 2018-03-08 MED ORDER — SODIUM CHLORIDE 0.9% FLUSH: 3.0000 mL | INTRAVENOUS | Status: DC | PRN

## 2018-03-08 MED ORDER — 0.9 % SODIUM CHLORIDE (POUR BTL) OPTIME
TOPICAL | Status: DC | PRN
  Administered 2018-03-08: 1000 mL

## 2018-03-08 MED ORDER — THROMBIN 20000 UNITS EX SOLR
CUTANEOUS | Status: DC | PRN
  Administered 2018-03-08: 16:00:00 via TOPICAL

## 2018-03-08 MED ORDER — OXYCODONE-ACETAMINOPHEN 5-325 MG PO TABS
1.0000 | ORAL_TABLET | ORAL | Status: DC | PRN
  Administered 2018-03-08: 1 via ORAL
  Administered 2018-03-09 (×2): 2 via ORAL
  Filled 2018-03-08 (×2): qty 2

## 2018-03-08 MED ORDER — ARTIFICIAL TEARS OPHTHALMIC OINT
TOPICAL_OINTMENT | OPHTHALMIC | Status: AC
  Filled 2018-03-08: qty 3.5

## 2018-03-08 MED ORDER — GLYCOPYRROLATE PF 0.2 MG/ML IJ SOSY
PREFILLED_SYRINGE | INTRAMUSCULAR | Status: AC
  Filled 2018-03-08: qty 1

## 2018-03-08 MED ORDER — FENTANYL CITRATE (PF) 100 MCG/2ML IJ SOLN
INTRAMUSCULAR | Status: AC
  Filled 2018-03-08: qty 2

## 2018-03-08 MED ORDER — LIDOCAINE 2% (20 MG/ML) 5 ML SYRINGE
INTRAMUSCULAR | Status: AC
  Filled 2018-03-08: qty 5

## 2018-03-08 MED ORDER — ADULT MULTIVITAMIN W/MINERALS CH
1.0000 | ORAL_TABLET | Freq: Every day | ORAL | Status: DC
  Administered 2018-03-09: 1 via ORAL
  Filled 2018-03-08 (×2): qty 1

## 2018-03-08 MED ORDER — SODIUM CHLORIDE 0.9% FLUSH
3.0000 mL | Freq: Two times a day (BID) | INTRAVENOUS | Status: DC
  Administered 2018-03-08: 3 mL via INTRAVENOUS

## 2018-03-08 MED ORDER — PROPOFOL 10 MG/ML IV BOLUS
INTRAVENOUS | Status: DC | PRN
  Administered 2018-03-08: 200 mg via INTRAVENOUS

## 2018-03-08 MED ORDER — ONDANSETRON HCL 4 MG/2ML IJ SOLN
INTRAMUSCULAR | Status: DC | PRN
  Administered 2018-03-08 (×2): 4 mg via INTRAVENOUS

## 2018-03-08 MED ORDER — POVIDONE-IODINE 7.5 % EX SOLN
Freq: Once | CUTANEOUS | Status: DC
  Filled 2018-03-08: qty 118

## 2018-03-08 MED ORDER — OXYCODONE-ACETAMINOPHEN 5-325 MG PO TABS
ORAL_TABLET | ORAL | Status: AC
  Filled 2018-03-08: qty 1

## 2018-03-08 MED ORDER — ALUM & MAG HYDROXIDE-SIMETH 200-200-20 MG/5ML PO SUSP: 30.0000 mL | Freq: Four times a day (QID) | ORAL | Status: DC | PRN

## 2018-03-08 MED ORDER — ONDANSETRON HCL 4 MG PO TABS: 4.0000 mg | ORAL_TABLET | Freq: Four times a day (QID) | ORAL | Status: DC | PRN

## 2018-03-08 MED ORDER — DIAZEPAM 5 MG PO TABS: 5.0000 mg | ORAL_TABLET | Freq: Four times a day (QID) | ORAL | Status: DC | PRN

## 2018-03-08 MED ORDER — GLYCOPYRROLATE 0.2 MG/ML IJ SOLN
INTRAMUSCULAR | Status: DC | PRN
  Administered 2018-03-08: 0.2 mg via INTRAVENOUS

## 2018-03-08 MED ORDER — KETOROLAC TROMETHAMINE 30 MG/ML IJ SOLN: 30.0000 mg | Freq: Once | INTRAMUSCULAR | Status: DC

## 2018-03-08 SURGICAL SUPPLY — 84 items
APL SKNCLS STERI-STRIP NONHPOA (GAUZE/BANDAGES/DRESSINGS) ×1
BENZOIN TINCTURE PRP APPL 2/3 (GAUZE/BANDAGES/DRESSINGS) ×3 IMPLANT
BIT DRILL NEURO 2X3.1 SFT TUCH (MISCELLANEOUS) ×1 IMPLANT
BIT DRILL SRG 14X2.2XFLT CHK (BIT) IMPLANT
BIT DRL SRG 14X2.2XFLT CHK (BIT) ×1
BLADE CLIPPER SURG (BLADE) ×1 IMPLANT
BLADE SURG 15 STRL LF DISP TIS (BLADE) ×1 IMPLANT
BLADE SURG 15 STRL SS (BLADE) ×3
BONE VIVIGEN FORMABLE 1.3CC (Bone Implant) ×6 IMPLANT
BUR MATCHSTICK NEURO 3.0 LAGG (BURR) IMPLANT
CARTRIDGE OIL MAESTRO DRILL (MISCELLANEOUS) ×1 IMPLANT
CLOSURE WOUND 1/2 X4 (GAUZE/BANDAGES/DRESSINGS) ×1
COLLAR CERV LO CONTOUR FIRM DE (SOFTGOODS) IMPLANT
COLLAR CERV PROCARE ST 2.25 (SOFTGOODS) ×2 IMPLANT
CORDS BIPOLAR (ELECTRODE) ×3 IMPLANT
COVER SURGICAL LIGHT HANDLE (MISCELLANEOUS) ×3 IMPLANT
CRADLE DONUT ADULT HEAD (MISCELLANEOUS) ×3 IMPLANT
DECANTER SPIKE VIAL GLASS SM (MISCELLANEOUS) ×3 IMPLANT
DEVICE ENDSKLTN MED 6 7MM (Orthopedic Implant) IMPLANT
DIFFUSER DRILL AIR PNEUMATIC (MISCELLANEOUS) ×3 IMPLANT
DRAIN JACKSON RD 7FR 3/32 (WOUND CARE) IMPLANT
DRAPE C-ARM 42X72 X-RAY (DRAPES) ×3 IMPLANT
DRAPE POUCH INSTRU U-SHP 10X18 (DRAPES) ×3 IMPLANT
DRAPE SURG 17X23 STRL (DRAPES) ×12 IMPLANT
DRILL BIT SKYLINE 14MM (BIT) ×3
DRILL NEURO 2X3.1 SOFT TOUCH (MISCELLANEOUS) ×3
DURAPREP 26ML APPLICATOR (WOUND CARE) ×3 IMPLANT
ELECT COATED BLADE 2.86 ST (ELECTRODE) ×3 IMPLANT
ELECT REM PT RETURN 9FT ADLT (ELECTROSURGICAL) ×3
ELECTRODE REM PT RTRN 9FT ADLT (ELECTROSURGICAL) ×1 IMPLANT
ENDOSKELETON MED 6 7MM (Orthopedic Implant) ×3 IMPLANT
EVACUATOR SILICONE 100CC (DRAIN) IMPLANT
GAUZE 4X4 16PLY RFD (DISPOSABLE) ×3 IMPLANT
GAUZE SPONGE 4X4 12PLY STRL (GAUZE/BANDAGES/DRESSINGS) ×3 IMPLANT
GAUZE SPONGE 4X4 12PLY STRL LF (GAUZE/BANDAGES/DRESSINGS) ×3 IMPLANT
GLOVE BIO SURGEON STRL SZ7 (GLOVE) ×3 IMPLANT
GLOVE BIO SURGEON STRL SZ8 (GLOVE) ×3 IMPLANT
GLOVE BIOGEL PI IND STRL 7.0 (GLOVE) ×2 IMPLANT
GLOVE BIOGEL PI IND STRL 8 (GLOVE) ×1 IMPLANT
GLOVE BIOGEL PI INDICATOR 7.0 (GLOVE) ×4
GLOVE BIOGEL PI INDICATOR 8 (GLOVE) ×2
GOWN STRL REUS W/ TWL LRG LVL3 (GOWN DISPOSABLE) ×1 IMPLANT
GOWN STRL REUS W/ TWL XL LVL3 (GOWN DISPOSABLE) ×1 IMPLANT
GOWN STRL REUS W/TWL LRG LVL3 (GOWN DISPOSABLE) ×6
GOWN STRL REUS W/TWL XL LVL3 (GOWN DISPOSABLE) ×3
IMPL S ENDOSKEL TC 8MM ODEG (Orthopedic Implant) IMPLANT
IMPLANT S ENDOSKEL TC 8MM ODEG (Orthopedic Implant) ×3 IMPLANT
INTERLOCK LRDTC CRVCL VBR 7MM (Bone Implant) IMPLANT
IV CATH 14GX2 1/4 (CATHETERS) ×3 IMPLANT
KIT BASIN OR (CUSTOM PROCEDURE TRAY) ×3 IMPLANT
KIT TURNOVER KIT B (KITS) ×3 IMPLANT
LORDOTIC CERVICAL VBR 7MM SM (Bone Implant) ×3 IMPLANT
MANIFOLD NEPTUNE II (INSTRUMENTS) ×3 IMPLANT
NDL SPNL 20GX3.5 QUINCKE YW (NEEDLE) ×1 IMPLANT
NEEDLE PRECISIONGLIDE 27X1.5 (NEEDLE) ×3 IMPLANT
NEEDLE SPNL 20GX3.5 QUINCKE YW (NEEDLE) ×3 IMPLANT
NS IRRIG 1000ML POUR BTL (IV SOLUTION) ×3 IMPLANT
OIL CARTRIDGE MAESTRO DRILL (MISCELLANEOUS) ×3
PACK ORTHO CERVICAL (CUSTOM PROCEDURE TRAY) ×3 IMPLANT
PAD ARMBOARD 7.5X6 YLW CONV (MISCELLANEOUS) ×6 IMPLANT
PATTIES SURGICAL .5 X.5 (GAUZE/BANDAGES/DRESSINGS) ×4 IMPLANT
PATTIES SURGICAL .5 X1 (DISPOSABLE) ×3 IMPLANT
PIN DISTRACTION 14 (PIN) ×4 IMPLANT
PLATE SKYLINE 3LVL 54MM SPINAL (Plate) ×2 IMPLANT
SCREW SKYLINE VAR OS 14MM (Screw) ×24 IMPLANT
SPONGE INTESTINAL PEANUT (DISPOSABLE) ×5 IMPLANT
SPONGE SURGIFOAM ABS GEL 100 (HEMOSTASIS) ×3 IMPLANT
STRIP CLOSURE SKIN 1/2X4 (GAUZE/BANDAGES/DRESSINGS) ×2 IMPLANT
SUCTION FRAZIER HANDLE 10FR (MISCELLANEOUS) ×2
SUCTION TUBE FRAZIER 10FR DISP (MISCELLANEOUS) ×1 IMPLANT
SURGIFLO W/THROMBIN 8M KIT (HEMOSTASIS) IMPLANT
SUT BONE WAX W31G (SUTURE) ×2 IMPLANT
SUT MNCRL AB 4-0 PS2 18 (SUTURE) ×3 IMPLANT
SUT SILK 4 0 (SUTURE)
SUT SILK 4-0 18XBRD TIE 12 (SUTURE) IMPLANT
SUT VIC AB 2-0 CT2 18 VCP726D (SUTURE) ×5 IMPLANT
SYR BULB IRRIGATION 50ML (SYRINGE) ×3 IMPLANT
SYR CONTROL 10ML LL (SYRINGE) ×9 IMPLANT
TAPE CLOTH 4X10 WHT NS (GAUZE/BANDAGES/DRESSINGS) ×1 IMPLANT
TAPE UMBILICAL COTTON 1/8X30 (MISCELLANEOUS) ×3 IMPLANT
TOWEL OR 17X24 6PK STRL BLUE (TOWEL DISPOSABLE) ×3 IMPLANT
TOWEL OR 17X26 10 PK STRL BLUE (TOWEL DISPOSABLE) ×3 IMPLANT
WATER STERILE IRR 1000ML POUR (IV SOLUTION) ×3 IMPLANT
YANKAUER SUCT BULB TIP NO VENT (SUCTIONS) ×3 IMPLANT

## 2018-03-08 NOTE — Anesthesia Procedure Notes (Signed)
Procedure Name: Intubation Date/Time: 03/08/2018 3:56 PM Performed by: Murvin Natal, MD Pre-anesthesia Checklist: Patient identified, Emergency Drugs available, Suction available and Patient being monitored Patient Re-evaluated:Patient Re-evaluated prior to induction Oxygen Delivery Method: Circle system utilized Preoxygenation: Pre-oxygenation with 100% oxygen Induction Type: IV induction Ventilation: Mask ventilation without difficulty Laryngoscope Size: Glidescope (T3) Grade View: Grade I Tube type: Oral Tube size: 7.5 mm Number of attempts: 1 Placement Confirmation: ETT inserted through vocal cords under direct vision,  positive ETCO2 and breath sounds checked- equal and bilateral Secured at: 23 cm Tube secured with: Tape Dental Injury: Teeth and Oropharynx as per pre-operative assessment  Difficulty Due To: Difficulty was anticipated

## 2018-03-08 NOTE — ED Notes (Signed)
Rec'd call from Dr. Lynann Bologna, pt to be prepared for sx today; RN to collect pre-op labs and MD shall be over shortly.

## 2018-03-08 NOTE — Transfer of Care (Signed)
Immediate Anesthesia Transfer of Care Note  Patient: Calvin [name]  Procedure(s) Performed: ANTERIOR CERVICAL DECOMPRESSION/DISCECTOMY FUSION , cervical 3-4, cervical 4-5, cervical 5-6 with intrumentational and allograft (N/A Neck)  Patient Location: PACU  Anesthesia Type:General  Level of Consciousness: drowsy  Airway & Oxygen Therapy: Patient Spontanous Breathing and Patient connected to nasal cannula oxygen  Post-op Assessment: Report given to RN and Post -op Vital signs reviewed and stable  Post vital signs: Reviewed and stable  Last Vitals:  Vitals Value Taken Time  BP 134/96 03/08/2018  7:25 PM  Temp    Pulse 113 03/08/2018  7:27 PM  Resp 9 03/08/2018  7:27 PM  SpO2 98 % 03/08/2018  7:27 PM  Vitals shown include unvalidated device data.  Last Pain:  Vitals:   03/08/18 0757  TempSrc:   PainSc: 8          Complications: No apparent anesthesia complications

## 2018-03-08 NOTE — H&P (Signed)
CONSULT NOTE  Chief Complaint: Progressive deterioration in balance  HPI: Calvin [name] is a 57 y.o. male who presents with ongoing balance deterioration.  The patient was previously evaluated by me, at which time an MRI was reviewed, notable for severe spinal stenosis spanning C3-4 to C5-6.  We did discuss proceeding with an elective anterior cervical procedure.  However, the patient's balance has additionally deteriorated.  He has progressively noted weakness in his bilateral legs.  He feels that his symptoms have progressed to the point where he was unable to care for himself at home.  I did discuss this with him yesterday over the telephone, subsequent to my evaluation with him in the office.  Again, he did feel that his symptoms were progressive, and that getting around the house was extremely difficult for him.  Given his inability to care for himself at home, I did recommend that he present to the emergency department.  He did ultimately present this morning with symptoms as outlined above.   Patient has failed multiple forms of conservative care and continues to have pain (see office notes for additional details regarding the patient's full course of treatment)  Past Medical History:  Diagnosis Date  . Arthritis    back, shoulders   . Constipation   . Disc disorder   . Hypertension   . Lumbar herniated disc   . Muscle spasm    Past Surgical History:  Procedure Laterality Date  . COLONOSCOPY    . FOOT SURGERY     right, ~ 2002  . POLYPECTOMY     Social History   Socioeconomic History  . Marital status: Single    Spouse name: Not on file  . Number of children: Not on file  . Years of education: Not on file  . Highest education level: Not on file  Occupational History  . Not on file  Social Needs  . Financial resource strain: Not on file  . Food insecurity:    Worry: Not on file    Inability: Not on file  . Transportation needs:    Medical: Not on file   Non-medical: Not on file  Tobacco Use  . Smoking status: Current Every Day Smoker    Packs/day: 0.10    Years: 20.00    Pack years: 2.00    Types: Cigarettes  . Smokeless tobacco: Never Used  . Tobacco comment: 5 cigs a day   Substance and Sexual Activity  . Alcohol use: No  . Drug use: No  . Sexual activity: Not on file  Lifestyle  . Physical activity:    Days per week: Not on file    Minutes per session: Not on file  . Stress: Not on file  Relationships  . Social connections:    Talks on phone: Not on file    Gets together: Not on file    Attends religious service: Not on file    Active member of club or organization: Not on file    Attends meetings of clubs or organizations: Not on file    Relationship status: Not on file  Other Topics Concern  . Not on file  Social History Narrative  . Not on file   Family History  Problem Relation Age of Onset  . Diabetes Mother   . Hypertension Mother   . Heart disease Mother   . Hyperlipidemia Mother   . Diabetes Brother   . Diabetes Daughter   . Hypertension Sister   .  Diabetes Sister   . Stomach cancer Maternal Grandfather   . Colon cancer Neg Hx   . Esophageal cancer Neg Hx   . Rectal cancer Neg Hx   . Colon polyps Neg Hx    No Known Allergies Prior to Admission medications   Medication Sig Start Date End Date Taking? Authorizing Provider  Aspirin-Salicylamide-Caffeine (BC FAST PAIN RELIEF) [phone] MG PACK Take 2 Packages by mouth every 6 (six) hours as needed (headaches).    Yes [provider]  cyclobenzaprine (FLEXERIL) 5 MG tablet Take 1 tablet (5 mg total) by mouth 2 (two) times daily as needed for muscle spasms. MUST MAKE APPT FOR FURTHER REFILLS Patient taking differently: Take 5 mg by mouth 2 (two) times daily as needed for muscle spasms.  02/03/18  Yes Ladell Pier, MD  ibuprofen (ADVIL,MOTRIN) 200 MG tablet Take 400 mg by mouth every 6 (six) hours as needed for headache.    Yes [provider]  sildenafil (VIAGRA) 100 MG tablet 1 tab PO 1/2 hr before intercourse PRN.  Limit use to 1 tab /24 hr period. Patient taking differently: Take 100 mg by mouth as needed for erectile dysfunction (30 minutes before intercourse.).  09/21/17  Yes Ladell Pier, MD  amLODipine (NORVASC) 10 MG tablet Take 1 tablet (10 mg total) by mouth daily. 03/06/18   Ladell Pier, MD  DULoxetine (CYMBALTA) 30 MG capsule 1 tab PO daily x 3 wks then 2 tabs daily. Patient not taking: Reported on 03/06/2018 05/16/17   Ladell Pier, MD  hydrochlorothiazide (HYDRODIURIL) 25 MG tablet Take 1 tablet (25 mg total) by mouth daily. 03/06/18   Ladell Pier, MD  Multiple Vitamin (MULTIVITAMIN) tablet Take 1 tablet by mouth daily.    [provider]     All other systems have been reviewed and were otherwise negative with the exception of those mentioned in the HPI and as above.  Physical Exam: There were no vitals filed for this visit.  There is no height or weight on file to calculate BMI.  General: Alert, no acute distress Cardiovascular: No pedal edema Respiratory: No cyanosis, no use of accessory musculature Skin: No lesions in the area of chief complaint Neurologic: Sensation intact distally Psychiatric: Patient is competent for consent with normal mood and affect Lymphatic: No axillary or cervical lymphadenopathy  MUSCULOSKELETAL: Patient has a prominent positive bilateral Hoffmann sign.  He does have a very wide-based gait.  Diffuse weakness is noted throughout his bilateral lower extremities.  Assessment/Plan: Progressive cervical myelopathy, resulting in progressive bilateral leg weakness and progressive balance instability  Plan for Procedure(s): I did again discussed the patient's condition with him.  I do feel that given the progressive nature of his symptoms, proceeding with surgical intervention is the most appropriate course of action.  We did discuss again  the details of the procedure outlined below.  He does understand the risks and benefits associated with surgery, and does wish to proceed.  He is currently n.p.o.  We will proceed with surgery later today.  ANTERIOR CERVICAL DECOMPRESSION FUSION, CERVICAL 3-4, CERVICAL 4-5, CERVICAL 5-6 WITH INSTRUMENTATION AND ALLOGRAFT   Sinclair Ship, MD 03/08/2018 11:06 AM

## 2018-03-08 NOTE — Op Note (Signed)
NAME:  Nishant Schrecengost                MEDICAL RECORD NO.:  [id number]  PHYSICIAN:  Phylliss Bob, MD      DATE OF BIRTH:  1960-07-28  DATE OF PROCEDURE:  03/08/2018                              OPERATIVE REPORT   PREOPERATIVE DIAGNOSES: 1. Progressive cervical myelopathy 2. Severe spinal stenosis spanning C3-C6  POSTOPERATIVE DIAGNOSES: 1. Progressive cervical myelopathy 2. Severe spinal stenosis spanning C3-C6  PROCEDURE: 1. Anterior cervical decompression and fusion C3/4, C4/5, C5/6 2. Placement of anterior instrumentation, C3-C6. 3. Insertion of interbody device x 3 (Titan intervertebral spacers). 4. Intraoperative use of fluoroscopy. 5. Use of morselized allograft - ViviGen.  SURGEON:  Phylliss Bob, MD  ASSISTANT:  Pricilla Holm, PA-C.  ANESTHESIA:  General endotracheal anesthesia.  COMPLICATIONS:  None.  DISPOSITION:  Stable.  ESTIMATED BLOOD LOSS:  Minimal.  INDICATIONS FOR SURGERY:  Briefly, Mr. Hyams is a pleasant 57 year old male, who did present to me rapidly progressive deterioration in balance and fine motor skills.  The patient's MRI did reveal the findings noted above.  Given the patient's progressive symptoms, we did discuss proceeding with the procedure noted above.  The patient was fully aware of the risks and limitations of surgery as outlined in my preoperative note.  OPERATIVE DETAILS:  On 03/08/2018, the patient was brought to surgery and general endotracheal anesthesia was administered.  The patient was placed supine on the hospital bed. The neck was gently extended.  All bony prominences were meticulously padded.  The neck was prepped and draped in the usual sterile fashion.  At this point, I did make a left-sided transverse incision.  The platysma was incised.  A Smith-Robinson approach was used and the anterior spine was identified. A self-retaining retractor was placed.  I then subperiosteally exposed the vertebral bodies from  C3-C6.  Caspar pins were then placed into the C3 and C4 vertebral bodies and distraction was applied.  A thorough and complete C3-4 intervertebral diskectomy was performed.  The posterior longitudinal ligament was identified and entered using a nerve hook.  I then used #1 followed by #2 Kerrison to perform a thorough and complete intervertebral diskectomy.  The spinal canal was thoroughly decompressed, as was the right and left neuroforamen.  The endplates were then prepared and the appropriate-sized intervertebral spacer was then packed with ViviGen and tamped into position in the usual fashion.  The upper Caspar pin was then removed and placed into the C5 vertebral body and once again, distraction was applied across the C4-5 intervertebral space.  I then again performed a thorough and complete diskectomy, thoroughly decompressing the spinal canal and bilateral neuroforamena.  After preparing the endplates, the appropriate-sized intervertebral spacer was packed with ViviGen and tamped into position.  The upper Caspar pin was then removed and placed into the C6 vertebral body and once again, distraction was applied across the C5-6 intervertebral space.  I then again performed a thorough and complete diskectomy, thoroughly decompressing the spinal canal and bilateral neuroforamena.  After preparing the endplates, the appropriate-sized intervertebral spacer was packed with ViviGen and tamped into position.  The Caspar pins then were removed and bone wax was placed in their place.  The appropriate-sized anterior cervical plate was placed over the anterior spine.  14 mm variable angle screws were placed, 2 in each vertebral body  from C3-C6 for a total of 8 vertebral body screws.  The screws were then locked to the plate using the cam locking mechanism.  I was very pleased with the final fluoroscopic images.  The wound was then irrigated.  The wound was then explored for any undue bleeding and  there was no bleeding noted. The wound was then closed in layers using 2-0 Vicryl, followed by 4-0 Monocryl.  Benzoin and Steri-Strips were applied, followed by sterile dressing.  All instrument counts were correct at the termination of the procedure.  Of note, Pricilla Holm, PA-C, was my assistant throughout surgery, and did aid in retraction, suctioning, and closure from start to finish.     Phylliss Bob, MD

## 2018-03-08 NOTE — Anesthesia Preprocedure Evaluation (Signed)
Anesthesia Evaluation  Patient identified by MRN, date of birth, ID band Patient awake    Reviewed: Allergy & Precautions, NPO status , Patient's Chart, lab work & pertinent test results  Airway Mallampati: II  TM Distance: >3 FB Neck ROM: Limited    Dental  (+) Dental Advisory Given   Pulmonary Current Smoker,    breath sounds clear to auscultation       Cardiovascular hypertension, Pt. on medications  Rhythm:Regular Rate:Normal     Neuro/Psych  Neuromuscular disease    GI/Hepatic negative GI ROS, Neg liver ROS,   Endo/Other  negative endocrine ROS  Renal/GU negative Renal ROS     Musculoskeletal  (+) Arthritis ,   Abdominal   Peds  Hematology negative hematology ROS (+)   Anesthesia Other Findings   Reproductive/Obstetrics                             Lab Results  Component Value Date   WBC 7.5 03/08/2018   HGB 13.7 03/08/2018   HCT 42.2 03/08/2018   MCV 84.1 03/08/2018   PLT 228 03/08/2018   Lab Results  Component Value Date   CREATININE 1.07 03/08/2018   BUN 13 03/08/2018   NA 136 03/08/2018   K 4.2 03/08/2018   CL 101 03/08/2018   CO2 27 03/08/2018    Anesthesia Physical Anesthesia Plan  ASA: II  Anesthesia Plan: General   Post-op Pain Management:    Induction: Intravenous  PONV Risk Score and Plan: 1 and Ondansetron, Propofol infusion and Treatment may vary due to age or medical condition  Airway Management Planned: Oral ETT and Video Laryngoscope Planned  Additional Equipment:   Intra-op Plan:   Post-operative Plan: Extubation in OR  Informed Consent: I have reviewed the patients History and Physical, chart, labs and discussed the procedure including the risks, benefits and alternatives for the proposed anesthesia with the patient or authorized representative who has indicated his/her understanding and acceptance.   Dental advisory given  Plan Discussed  with:   Anesthesia Plan Comments:         Anesthesia Quick Evaluation

## 2018-03-08 NOTE — Anesthesia Postprocedure Evaluation (Signed)
Anesthesia Post Note  Patient: Engineer, mining) Performed: ANTERIOR CERVICAL DECOMPRESSION/DISCECTOMY FUSION , cervical 3-4, cervical 4-5, cervical 5-6 with intrumentational and allograft (N/A Neck)     Patient location during evaluation: PACU Anesthesia Type: General Level of consciousness: awake Pain management: pain level controlled Vital Signs Assessment: post-procedure vital signs reviewed and stable Respiratory status: spontaneous breathing Cardiovascular status: stable Anesthetic complications: no    Last Vitals:  Vitals:   03/08/18 0754 03/08/18 1925  BP: (!) 148/107 (!) 134/96  Pulse: 81 (!) 116  Resp: 17 10  Temp: 36.8 C 36.7 C  SpO2: 100% 95%    Last Pain:  Vitals:   03/08/18 0757  TempSrc:   PainSc: 8                  Calvin [name]

## 2018-03-08 NOTE — ED Triage Notes (Signed)
Pt to ER for evaluation of chronic lower back pain onset "years ago." radiating down both legs. Ambulatory with cane, per usual.

## 2018-03-08 NOTE — ED Provider Notes (Signed)
[facility] EMERGENCY DEPARTMENT Provider Note   CSN: [id number] Arrival date & time: 03/08/18  0750     History   Chief Complaint Chief Complaint  Patient presents with  . Back Pain    HPI Calvin [name] is a 57 y.o. male.  Pt presents to the ED today with chronic low back pain.  Pt has a hx of LBP since 2014 with bilateral sciatica.  He has seen his pcp and his orthopedist.  He feels like the numbness it's getting worse.  He actually presented to [facility] this morning for neck surgery.  The procedure was cancelled.  He is unsure why.  So, he came to the ED.     Past Medical History:  Diagnosis Date  . Arthritis    back, shoulders   . Constipation   . Disc disorder   . Hypertension   . Lumbar herniated disc   . Muscle spasm     Patient Active Problem List   Diagnosis Date Noted  . Tubular adenoma 05/16/2017  . Depressed mood 05/16/2017  . Substance abuse ([place]) 05/16/2017  . Hallux valgus 08/03/2016  . Disc disorder   . Lumbar facet arthropathy 08/06/2014  . Lumbar radiculopathy 08/06/2014  . ED (erectile dysfunction) 01/07/2014  . Unspecified vitamin D deficiency 01/07/2014  . Left shoulder pain 08/10/2013  . Chronic low back pain 08/10/2013  . Essential hypertension 08/10/2013  . Tobacco abuse 08/10/2013    Past Surgical History:  Procedure Laterality Date  . COLONOSCOPY    . FOOT SURGERY     right, ~ 2002  . POLYPECTOMY          Home Medications    Prior to Admission medications   Medication Sig Start Date End Date Taking? Authorizing Provider  amLODipine (NORVASC) 10 MG tablet Take 1 tablet (10 mg total) by mouth daily. 03/06/18  Yes Ladell Pier, MD  Aspirin-Salicylamide-Caffeine (BC FAST PAIN RELIEF) [phone] MG PACK Take 2 Packages by mouth every 6 (six) hours as needed (headaches).    Yes [provider]  cyclobenzaprine (FLEXERIL) 5 MG tablet Take 1 tablet (5 mg total) by mouth 2 (two) times daily as needed for  muscle spasms. MUST MAKE APPT FOR FURTHER REFILLS Patient taking differently: Take 5 mg by mouth 2 (two) times daily as needed for muscle spasms.  02/03/18  Yes Ladell Pier, MD  hydrochlorothiazide (HYDRODIURIL) 25 MG tablet Take 1 tablet (25 mg total) by mouth daily. 03/06/18  Yes Ladell Pier, MD  ibuprofen (ADVIL,MOTRIN) 200 MG tablet Take 400 mg by mouth every 6 (six) hours as needed for headache.    Yes [provider]  Multiple Vitamin (MULTIVITAMIN) tablet Take 1 tablet by mouth daily.   Yes [provider]  sildenafil (VIAGRA) 100 MG tablet 1 tab PO 1/2 hr before intercourse PRN.  Limit use to 1 tab /24 hr period. Patient taking differently: Take 100 mg by mouth as needed for erectile dysfunction (30 minutes before intercourse.).  09/21/17  Yes Ladell Pier, MD  DULoxetine (CYMBALTA) 30 MG capsule 1 tab PO daily x 3 wks then 2 tabs daily. Patient not taking: Reported on 03/06/2018 05/16/17   Ladell Pier, MD    Family History Family History  Problem Relation Age of Onset  . Diabetes Mother   . Hypertension Mother   . Heart disease Mother   . Hyperlipidemia Mother   . Diabetes Brother   . Diabetes Daughter   . Hypertension  Sister   . Diabetes Sister   . Stomach cancer Maternal Grandfather   . Colon cancer Neg Hx   . Esophageal cancer Neg Hx   . Rectal cancer Neg Hx   . Colon polyps Neg Hx     Social History Social History   Tobacco Use  . Smoking status: Current Every Day Smoker    Packs/day: 0.10    Years: 20.00    Pack years: 2.00    Types: Cigarettes  . Smokeless tobacco: Never Used  . Tobacco comment: 5 cigs a day   Substance Use Topics  . Alcohol use: No  . Drug use: No     Allergies   Patient has no known allergies.   Review of Systems Review of Systems  Musculoskeletal: Positive for back pain.  All other systems reviewed and are negative.    Physical Exam Updated Vital Signs BP (!) 148/107 (BP Location:  Right Arm)   Pulse 81   Temp 98.3 F (36.8 C) (Oral)   Resp 17   Ht 6' (1.829 m)   Wt 68 kg   SpO2 100%   BMI 20.34 kg/m   Physical Exam  Constitutional: He is oriented to person, place, and time. He appears well-developed and well-nourished.  HENT:  Head: Normocephalic and atraumatic.  Right Ear: External ear normal.  Left Ear: External ear normal.  Nose: Nose normal.  Mouth/Throat: Oropharynx is clear and moist.  Eyes: Pupils are equal, round, and reactive to light. Conjunctivae and EOM are normal.  Neck: Normal range of motion. Neck supple.  Cardiovascular: Normal rate, regular rhythm, normal heart sounds and intact distal pulses.  Pulmonary/Chest: Effort normal and breath sounds normal.  Abdominal: Soft. Bowel sounds are normal.  Musculoskeletal:       Lumbar back: He exhibits decreased range of motion and tenderness.  Neurological: He is alert and oriented to person, place, and time.  Skin: Skin is warm. Capillary refill takes less than 2 seconds.  Psychiatric: He has a normal mood and affect. His behavior is normal. Judgment and thought content normal.  Nursing note and vitals reviewed.    ED Treatments / Results  Labs (all labs ordered are listed, but only abnormal results are displayed) Labs Reviewed  BASIC METABOLIC PANEL  CBC WITH DIFFERENTIAL/PLATELET    EKG EKG Interpretation  Date/Time:  Wednesday March 08 2018 10:44:43 EDT Ventricular Rate:  66 PR Interval:  204 QRS Duration: 94 QT Interval:  454 QTC Calculation: 475 R Axis:   -22 Text Interpretation:  Normal sinus rhythm Left ventricular hypertrophy Abnormal ECG No significant change since last tracing Confirmed by Isla Pence [phone]) on 03/08/2018 10:47:07 AM   Radiology No results found.  Procedures Procedures (including critical care time)  Medications Ordered in ED Medications  dexamethasone (DECADRON) injection 10 mg (10 mg Intravenous Given 03/08/18 0931)  ondansetron (ZOFRAN)  injection 4 mg (4 mg Intravenous Given 03/08/18 0930)  morphine 4 MG/ML injection 4 mg (4 mg Intravenous Given 03/08/18 0933)     Initial Impression / Assessment and Plan / ED Course  I have reviewed the triage vital signs and the nursing notes.  Pertinent labs & imaging results that were available during my care of the patient were reviewed by me and considered in my medical decision making (see chart for details).    While here, Dr. Lynann Bologna called down and said pt will be able to have surgery today.  Pain has improved.  Pt went up to OR from ED.  Final Clinical Impressions(s) / ED Diagnoses   Final diagnoses:  Bilateral sciatica    ED Discharge Orders    None       Isla Pence, MD 03/08/18 1149

## 2018-03-09 ENCOUNTER — Other Ambulatory Visit: Payer: Self-pay

## 2018-03-09 MED FILL — Thrombin (Recombinant) For Soln 20000 Unit: CUTANEOUS | Qty: 1 | Status: AC

## 2018-03-09 NOTE — Plan of Care (Signed)
  Problem: Safety: Goal: Ability to remain free from injury will improve Outcome: Progressing   

## 2018-03-09 NOTE — Progress Notes (Signed)
    Patient doing well  Denies arm pain Tolerating PO well Has been ambulating. Patient does feel that his balance has improved.    Physical Exam: Vitals:   03/08/18 2331 03/09/18 0334  BP: (!) 137/93 (!) 149/90  Pulse: 67 (!) 54  Resp: 18 18  Temp: 98.7 F (37.1 C) 98.5 F (36.9 C)  SpO2: 100% 98%    Neck soft/supple Dressing in place B/l lower extremity strength unchanged  POD #1 s/p ACDF, doing well  - encourage ambulation - Percocet for pain, Valium for muscle spasms - d/c home today after lunch with f/u in 2 weeks

## 2018-03-09 NOTE — Progress Notes (Signed)
Patient is discharged from room 3C09 at this time. Alert and in stable condition. IV site d/c'd and instructions read to patient and sister with understanding verbalized. Left unit via wheelchair with all belongings at side.

## 2018-03-10 ENCOUNTER — Encounter (HOSPITAL_COMMUNITY): Payer: Self-pay | Admitting: Orthopedic Surgery

## 2018-03-15 DIAGNOSIS — L97516 Non-pressure chronic ulcer of other part of right foot with bone involvement without evidence of necrosis: Secondary | ICD-10-CM | POA: Diagnosis not present

## 2018-03-16 ENCOUNTER — Ambulatory Visit (INDEPENDENT_AMBULATORY_CARE_PROVIDER_SITE_OTHER): Payer: Medicaid Other | Admitting: Podiatry

## 2018-03-16 VITALS — BP 135/87 | HR 91

## 2018-03-16 DIAGNOSIS — M86179 Other acute osteomyelitis, unspecified ankle and foot: Secondary | ICD-10-CM

## 2018-03-16 DIAGNOSIS — M2042 Other hammer toe(s) (acquired), left foot: Secondary | ICD-10-CM

## 2018-03-16 NOTE — Discharge Summary (Signed)
Patient ID: Calvin [name] MRN: [id number] DOB/AGE: 1961/05/03 57 y.o.  Admit date: 03/08/2018 Discharge date: 03/09/2018  Admission Diagnoses:  Active Problems:   Radiculopathy   Discharge Diagnoses:  Same  Past Medical History:  Diagnosis Date  . Arthritis    back, shoulders   . Constipation   . Disc disorder   . Hypertension   . Lumbar herniated disc   . Muscle spasm     Surgeries: Procedure(s): ANTERIOR CERVICAL DECOMPRESSION/DISCECTOMY FUSION , cervical 3-4, cervical 4-5, cervical 5-6 with intrumentational and allograft on 03/08/2018   Consultants:  None Discharged Condition: Improved  Hospital Course: Calvin [name] is an 57 y.o. male who was admitted 03/08/2018 for operative treatment of myelopathy. Patient has severe unremitting pain that affects sleep, daily activities, and work/hobbies. After pre-op clearance the patient was taken to the operating room on 03/08/2018 and underwent  Procedure(s): ANTERIOR CERVICAL DECOMPRESSION/DISCECTOMY FUSION , cervical 3-4, cervical 4-5, cervical 5-6 with intrumentational and allograft.    Patient was given perioperative antibiotics:  Anti-infectives (From admission, onward)   Start     Dose/Rate Route Frequency Ordered Stop   03/08/18 2330  ceFAZolin (ANCEF) IVPB 2g/100 mL premix     2 g 200 mL/hr over 30 Minutes Intravenous Every 8 hours 03/08/18 2129 03/09/18 0816   03/08/18 1230  ceFAZolin (ANCEF) IVPB 2g/100 mL premix     2 g 200 mL/hr over 30 Minutes Intravenous To [facility] 03/08/18 1222 03/08/18 1559   03/08/18 1221  ceFAZolin (ANCEF) 2-4 GM/100ML-% IVPB    Note to Pharmacy:  Grace Blight   : cabinet override      03/08/18 1221 03/08/18 1559       Patient was given sequential compression devices, early ambulation to prevent DVT.  Patient benefited maximally from hospital stay and there were no complications.    Recent vital signs: BP (!) 144/88 (BP Location: Right Arm)   Pulse 62   Temp 98.2 F (36.8 C)  (Oral)   Resp 16   Ht 6' (1.829 m)   Wt 68 kg   SpO2 99%   BMI 20.34 kg/m    Discharge Medications:   Allergies as of 03/09/2018   No Known Allergies     Medication List    TAKE these medications   amLODipine 10 MG tablet Commonly known as:  NORVASC Take 1 tablet (10 mg total) by mouth daily.   BC FAST PAIN RELIEF 650-195-33.3 MG Pack Generic drug:  Aspirin-Salicylamide-Caffeine Take 2 Packages by mouth every 6 (six) hours as needed (headaches).   DULoxetine 30 MG capsule Commonly known as:  CYMBALTA 1 tab PO daily x 3 wks then 2 tabs daily.   hydrochlorothiazide 25 MG tablet Commonly known as:  HYDRODIURIL Take 1 tablet (25 mg total) by mouth daily.   multivitamin tablet Take 1 tablet by mouth daily.   sildenafil 100 MG tablet Commonly known as:  VIAGRA 1 tab PO 1/2 hr before intercourse PRN.  Limit use to 1 tab /24 hr period. What changed:    how much to take  how to take this  when to take this  reasons to take this  additional instructions       Diagnostic Studies: Dg Cervical Spine 1 View  Result Date: 03/08/2018 CLINICAL DATA:  Anterior cervical decompression EXAM: DG C-ARM 61-120 MIN; DG CERVICAL SPINE - 1 VIEW COMPARISON:  MRI 02/24/2018, radiograph 02/07/2018 FINDINGS: Single low resolution intraoperative spot view of the cervical spine. Total fluoroscopy time was 11  seconds. Anterior plate and screw fixation C3 through C6 with interbody device at C3-C4, C4-C5 and C5-C6. Probable sponge material anterior to the surgical plate. IMPRESSION: Intraoperative fluoroscopic assistance provided during cervical spine surgery. Electronically Signed   By: Donavan Foil M.D.   On: 03/08/2018 19:38   Mr Cervical Spine W/o Contrast  Result Date: 02/24/2018 CLINICAL DATA:  Concern for cervical myelopathy. Worsening lower extremity weakness and unsteady gait. EXAM: MRI CERVICAL SPINE WITHOUT CONTRAST TECHNIQUE: Multiplanar, multisequence MR imaging of the cervical  spine was performed. No intravenous contrast was administered. COMPARISON:  Cervical spine plain films 02/07/2018. FINDINGS: Alignment: Straightening, and slight reversal of the normal cervical lordosis. Vertebrae: No worrisome osseous lesion, or fracture. No evidence of discitis. Cord: Abnormal cord signal at C3-4 relates to central canal stenosis. The cord is flattened due to spinal stenosis at this level. Foci of myelomalacia are seen in the LEFT hemicord on series 10, image 10, and the RIGHT hemicord on series 10, image 9. These are confirmed on sagittal T2 and STIR images, see series 8, images 6 and 9. Additional focus of cord injury is seen opposite the C6 vertebrae, related to stenosis at the C5-6 level, small focus of myelomalacia as seen on series 10, image 22, and also confirmed on sagittal imaging. Posterior Fossa, vertebral arteries, paraspinal tissues: No tonsillar herniation. Flow voids are maintained. No neck masses. In the pons, there appears to be T2 hyperintensity suggesting chronic microvascular ischemic change. Disc levels: C2-3: Annular bulge. BILATERAL facet arthropathy, greater on the LEFT. LEFT C3 foraminal narrowing is likely. C3-4: Disc space narrowing. Central protrusion with osseous spurring. Facet arthropathy with ligamentum flavum infolding. Moderate to severe stenosis. Cord flattening with abnormal cord signal. BILATERAL C4 foraminal narrowing. C4-5:  Annular bulge. Facet arthropathy. No definite impingement. C5-6: Central protrusion. Osseous spurring/uncinate hypertrophy. Mild to moderate stenosis cord flattening with abnormal cord signal just caudal to the interspace on the RIGHT. BILATERAL RIGHT greater than LEFT C6 foraminal narrowing. C6-7: Annular bulge. Uncinate spurring. BILATERAL C7 foraminal narrowing. C7-T1: Trace anterolisthesis. Annular bulge. Facet arthropathy. No impingement. IMPRESSION: Two separate areas of stenosis with cord compression, and abnormal cord signal are  observed at C3-4 and C5-6, worse superiorly. The stenosis is related to protruding central disc material, posterior element hypertrophy, and ventral osseous spurring. Reversal of the cervical lordotic curve could be contributory as well. Correlate clinically for either or both as a contributor to cervical myelopathy. Varying degrees of foraminal narrowing as described due to uncinate hypertrophy, facet disease and disc material. These are most notable at C2-3, C3-4, C5-6, and C6-7. Correlate clinically for radicular symptoms related to the upper extremity(ies). T2 hyperintensity of the pons suggesting small vessel disease. Consider elective MRI of the brain further evaluation. These results will be called to the ordering clinician or representative by the Radiologist Assistant, and communication documented in the PACS or zVision Dashboard. Electronically Signed   By: Staci Righter M.D.   On: 02/24/2018 18:30   Mr Lumbar Spine W/o Contrast  Result Date: 02/24/2018 CLINICAL DATA:  Low back pain. Worsening upper and lower extremity weakness. EXAM: MRI LUMBAR SPINE WITHOUT CONTRAST TECHNIQUE: Multiplanar, multisequence MR imaging of the lumbar spine was performed. No intravenous contrast was administered. COMPARISON:  MRI lumbar spine 04/29/2014 and 10/27/2013. MRI cervical spine reported separately. FINDINGS: Segmentation:  Standard. Alignment:  Anatomic. Vertebrae:  No worrisome osseous lesion. Conus medullaris and cauda equina: Conus extends to the L1 level. Conus and cauda equina appear normal. Paraspinal and other  soft tissues: Unremarkable Disc levels: L1-L2:  Normal. L2-L3:  Normal. L3-L4:  Normal. L4-L5: Central protrusion with annular rent. Posterior element hypertrophy affecting facets and ligamentum flava. Mild to moderate stenosis. BILATERAL L5 nerve root impingement. Mild BILATERAL foraminal narrowing is present, not clearly compressive. L5-S1: Shallow central protrusion. Facet arthropathy. No  subarticular zone narrowing. BILATERAL foraminal narrowing could affect either L5 nerve root, although these changes are fairly mild. Compared with 2015, protrusions at L5-S1 and L4-5 appears similar. IMPRESSION: Mild to moderate stenosis at L4-5 relates to central protrusion and posterior element hypertrophy. Either L5 nerve root could be affected. Shallow central protrusion L5-S1. No subarticular zone narrowing, but BILATERAL foraminal narrowing due to disc material and facet arthropathy could affect either L5 nerve root. Electronically Signed   By: Staci Righter M.D.   On: 02/24/2018 18:14   Dg Foot Complete Right  Result Date: 03/03/2018 CLINICAL DATA:  Second toe wound with delayed healing, initial encounter EXAM: RIGHT FOOT COMPLETE - 3+ VIEW COMPARISON:  01/20/2018 FINDINGS: Postsurgical changes are noted in the first metatarsal and first proximal phalanx stable from the prior exam. Second proximal phalanx demonstrates some cortical periosteal reaction as well as fragmentation increased from the prior exam consistent with healing fracture. No other focal bony abnormality is seen. Mild soft tissue swelling is seen in the second toe. IMPRESSION: Mild soft tissue swelling as well as some mild fragmentation and periosteal reaction along the distal aspect of the second proximal phalanx consistent with healing fracture. Electronically Signed   By: Inez Catalina M.D.   On: 03/03/2018 10:41   Dg C-arm 1-60 Min  Result Date: 03/08/2018 CLINICAL DATA:  Anterior cervical decompression EXAM: DG C-ARM 61-120 MIN; DG CERVICAL SPINE - 1 VIEW COMPARISON:  MRI 02/24/2018, radiograph 02/07/2018 FINDINGS: Single low resolution intraoperative spot view of the cervical spine. Total fluoroscopy time was 11 seconds. Anterior plate and screw fixation C3 through C6 with interbody device at C3-C4, C4-C5 and C5-C6. Probable sponge material anterior to the surgical plate. IMPRESSION: Intraoperative fluoroscopic assistance provided  during cervical spine surgery. Electronically Signed   By: Donavan Foil M.D.   On: 03/08/2018 19:38    Disposition:    POD #1 s/p ACDF, doing well  - encourage ambulation - Percocet for pain, Valium for muscle spasms -D/C today  -F/U in office 2 weeks   Signed: Justice Britain 03/16/2018, 12:57 PM

## 2018-03-24 NOTE — Progress Notes (Signed)
  Subjective:  Patient ID: Calvin [name], male    DOB: [birth year],  MRN: [id number]  Chief Complaint  Patient presents with  . Toe Injury    right 2nd toe fracture - last xrays done 03/03/18 - wound center has released patient - here for fracture treatment    57 y.o. male presents with the above complaint.  Reports history of right second toe fracture but states the wound is healed.  Last had x-rays early August.  Denies diabetes still having some pain in the toe.  Denies other pedal issues.  Review of Systems: Negative except as noted in the HPI. Denies N/V/F/Ch.  Past Medical History:  Diagnosis Date  . Arthritis    back, shoulders   . Constipation   . Disc disorder   . Hypertension   . Lumbar herniated disc   . Muscle spasm     Current Outpatient Medications:  .  amLODipine (NORVASC) 10 MG tablet, Take 1 tablet (10 mg total) by mouth daily., Disp: 90 tablet, Rfl: 3 .  Aspirin-Salicylamide-Caffeine (BC FAST PAIN RELIEF) 650-195-33.3 MG PACK, Take 2 Packages by mouth every 6 (six) hours as needed (headaches). , Disp: , Rfl:  .  DULoxetine (CYMBALTA) 30 MG capsule, 1 tab PO daily x 3 wks then 2 tabs daily. (Patient not taking: Reported on 03/06/2018), Disp: 60 capsule, Rfl: 2 .  hydrochlorothiazide (HYDRODIURIL) 25 MG tablet, Take 1 tablet (25 mg total) by mouth daily., Disp: 90 tablet, Rfl: 3 .  Multiple Vitamin (MULTIVITAMIN) tablet, Take 1 tablet by mouth daily., Disp: , Rfl:  .  sildenafil (VIAGRA) 100 MG tablet, 1 tab PO 1/2 hr before intercourse PRN.  Limit use to 1 tab /24 hr period. (Patient taking differently: Take 100 mg by mouth as needed for erectile dysfunction (30 minutes before intercourse.). ), Disp: 30 tablet, Rfl: 3  Social History   Tobacco Use  Smoking Status Current Every Day Smoker  . Packs/day: 0.10  . Years: 20.00  . Pack years: 2.00  . Types: Cigarettes  Smokeless Tobacco Never Used  Tobacco Comment   5 cigs a day     No Known Allergies Objective:    Vitals:   03/16/18 1552  BP: 135/87  Pulse: 91   There is no height or weight on file to calculate BMI. Constitutional Well developed. Well nourished.  Vascular Dorsalis pedis pulses palpable bilaterally. Posterior tibial pulses palpable bilaterally. Capillary refill normal to all digits.  No cyanosis or clubbing noted. Pedal hair growth normal.  Neurologic Normal speech. Oriented to person, place, and time. Epicritic sensation to light touch grossly present bilaterally.  Dermatologic Nails well groomed and normal in appearance. No open wounds.  Left second darkened without open ulceration No skin lesions.  Orthopedic: Normal joint ROM without pain or crepitus bilaterally. No visible deformities. No bony tenderness.   Radiographs: Prior x-rays reviewed.  Concern for erosion of the bone. Assessment:   1. Acute osteomyelitis of ankle or foot, unspecified laterality ([place])    Plan:  Patient was evaluated and treated and all questions answered.  Left second toe history of fracture -We will order labs to evaluate for bone infection -Patient may benefit from bone biopsy. -We will continue to monitor.  Return in about 3 weeks (around 04/06/2018) for Wound Care, Left 2nd toe.

## 2018-04-05 MED FILL — HYDROCHLOROTHIAZIDE 25 MG T: 25 | 30 days supply | Qty: 30 | Fill #1

## 2018-04-05 MED FILL — $VIAGRA 100 MG TABLET: 100 | 90 days supply | Qty: 30 | Fill #6

## 2018-04-05 MED FILL — AMLODIPINE BESYLATE 10 MG T: 10 | 30 days supply | Qty: 30 | Fill #1

## 2018-04-06 ENCOUNTER — Ambulatory Visit: Payer: Medicaid Other | Admitting: Podiatry

## 2018-04-10 ENCOUNTER — Ambulatory Visit: Payer: Medicaid Other | Admitting: Internal Medicine

## 2018-04-14 ENCOUNTER — Other Ambulatory Visit: Payer: Self-pay | Admitting: Internal Medicine

## 2018-04-14 DIAGNOSIS — M5416 Radiculopathy, lumbar region: Secondary | ICD-10-CM

## 2018-04-14 LAB — CBC WITH DIFFERENTIAL/PLATELET
Basophils Absolute: 50 cells/uL (ref 0–200)
Basophils Relative: 0.7 %
EOS PCT: 1.5 %
Eosinophils Absolute: 108 cells/uL (ref 15–500)
HEMATOCRIT: 38.1 % — AB (ref 38.5–50.0)
HEMOGLOBIN: 12.7 g/dL — AB (ref 13.2–17.1)
LYMPHS ABS: 2570 {cells}/uL (ref 850–3900)
MCH: 27.5 pg (ref 27.0–33.0)
MCHC: 33.3 g/dL (ref 32.0–36.0)
MCV: 82.5 fL (ref 80.0–100.0)
MPV: 11.3 fL (ref 7.5–12.5)
Monocytes Relative: 8.6 %
NEUTROS ABS: 3852 {cells}/uL (ref 1500–7800)
Neutrophils Relative %: 53.5 %
Platelets: 221 10*3/uL (ref 140–400)
RBC: 4.62 10*6/uL (ref 4.20–5.80)
RDW: 14 % (ref 11.0–15.0)
Total Lymphocyte: 35.7 %
WBC mixed population: 619 cells/uL (ref 200–950)
WBC: 7.2 10*3/uL (ref 3.8–10.8)

## 2018-04-14 LAB — SEDIMENTATION RATE: Sed Rate: 28 mm/h — ABNORMAL HIGH (ref 0–20)

## 2018-04-14 LAB — C-REACTIVE PROTEIN: CRP: 17.7 mg/L — ABNORMAL HIGH (ref <8.0)

## 2018-04-18 MED FILL — CYCLOBENZAPRINE 5 MG TABLET: 5 | 15 days supply | Qty: 30 | Fill #0

## 2018-04-20 ENCOUNTER — Ambulatory Visit (INDEPENDENT_AMBULATORY_CARE_PROVIDER_SITE_OTHER): Payer: Medicaid Other | Admitting: Podiatry

## 2018-04-20 DIAGNOSIS — M2012 Hallux valgus (acquired), left foot: Secondary | ICD-10-CM | POA: Diagnosis not present

## 2018-04-20 DIAGNOSIS — M86171 Other acute osteomyelitis, right ankle and foot: Secondary | ICD-10-CM | POA: Diagnosis not present

## 2018-04-20 DIAGNOSIS — R7982 Elevated C-reactive protein (CRP): Secondary | ICD-10-CM

## 2018-04-20 DIAGNOSIS — M2041 Other hammer toe(s) (acquired), right foot: Secondary | ICD-10-CM

## 2018-04-20 DIAGNOSIS — R700 Elevated erythrocyte sedimentation rate: Secondary | ICD-10-CM

## 2018-04-20 DIAGNOSIS — Z720 Tobacco use: Secondary | ICD-10-CM

## 2018-04-20 NOTE — Progress Notes (Addendum)
Subjective:  Patient ID: Calvin [name], male    DOB: 1960/09/21,  MRN: [id number]  Chief Complaint  Patient presents with  . Wound Check    3 week wound check and lab results    57 y.o. male presents with the above complaint.  Here for lab review.  States that the toe is still hurting and rubbing against the great toe. Review of Systems: Negative except as noted in the HPI. Denies N/V/F/Ch.  Past Medical History:  Diagnosis Date  . Arthritis    back, shoulders   . Constipation   . Disc disorder   . Hypertension   . Lumbar herniated disc   . Muscle spasm     Current Outpatient Medications:  .  amLODipine (NORVASC) 10 MG tablet, Take 1 tablet (10 mg total) by mouth daily., Disp: 90 tablet, Rfl: 3 .  Aspirin-Salicylamide-Caffeine (BC FAST PAIN RELIEF) 650-195-33.3 MG PACK, Take 2 Packages by mouth every 6 (six) hours as needed (headaches). , Disp: , Rfl:  .  cyclobenzaprine (FLEXERIL) 5 MG tablet, Take 1 tablet (5 mg total) by mouth 2 (two) times daily as needed for muscle spasms., Disp: 30 tablet, Rfl: 0 .  DULoxetine (CYMBALTA) 30 MG capsule, 1 tab PO daily x 3 wks then 2 tabs daily. (Patient not taking: Reported on 03/06/2018), Disp: 60 capsule, Rfl: 2 .  hydrochlorothiazide (HYDRODIURIL) 25 MG tablet, Take 1 tablet (25 mg total) by mouth daily., Disp: 90 tablet, Rfl: 3 .  Multiple Vitamin (MULTIVITAMIN) tablet, Take 1 tablet by mouth daily., Disp: , Rfl:  .  sildenafil (VIAGRA) 100 MG tablet, 1 tab PO 1/2 hr before intercourse PRN.  Limit use to 1 tab /24 hr period. (Patient taking differently: Take 100 mg by mouth as needed for erectile dysfunction (30 minutes before intercourse.). ), Disp: 30 tablet, Rfl: 3  Social History   Tobacco Use  Smoking Status Current Every Day Smoker  . Packs/day: 0.10  . Years: 20.00  . Pack years: 2.00  . Types: Cigarettes  Smokeless Tobacco Never Used  Tobacco Comment   5 cigs a day     No Known Allergies Objective:   There were no vitals  filed for this visit. There is no height or weight on file to calculate BMI. Constitutional Well developed. Well nourished.  Vascular Dorsalis pedis pulses palpable bilaterally. Posterior tibial pulses palpable bilaterally. Capillary refill normal to all digits.  No cyanosis or clubbing noted. Pedal hair growth normal.  Neurologic Normal speech. Oriented to person, place, and time. Epicritic sensation to light touch grossly present bilaterally.  Dermatologic Nails well groomed and normal in appearance. No open wounds.  Left second darkened without open ulceration No skin lesions.  Orthopedic: Normal joint ROM without pain or crepitus bilaterally. 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 No bony tenderness.   Results for NAHUEL, WILBERT (MRN [id number]) as of 04/20/2018 18:11  Ref. Range 04/13/2018 09:13  CRP Latest Ref Range: <8.0 mg/L 17.7 (H)  WBC Latest Ref Range: 3.8 - 10.8 Thousand/uL 7.2  WBC mixed population Latest Ref Range: 200 - 950 cells/uL 619  RBC Latest Ref Range: 4.20 - 5.80 Million/uL 4.62  Hemoglobin Latest Ref Range: 13.2 - 17.1 g/dL 12.7 (L)  HCT Latest Ref Range: 38.5 - 50.0 % 38.1 (L)  MCV Latest Ref Range: 80.0 - 100.0 fL 82.5  MCH Latest Ref Range: 27.0 - 33.0 pg 27.5  MCHC Latest Ref Range: 32.0 - 36.0 g/dL 33.3  RDW Latest Ref  Range: 11.0 - 15.0 % 14.0  Platelets Latest Ref Range: 140 - 400 Thousand/uL 221  MPV Latest Ref Range: 7.5 - 12.5 fL 11.3  Neutrophils Latest Units: % 53.5  Monocytes Relative Latest Units: % 8.6  Eosinophil Latest Units: % 1.5  Basophil Latest Units: % 0.7  NEUT# Latest Ref Range: 1,500 - 7,800 cells/uL 3,852  Lymphocyte # Latest Ref Range: 850 - 3,900 cells/uL 2,570  Total Lymphocyte Latest Units: % 35.7  Eosinophils Absolute Latest Ref Range: 15 - 500 cells/uL 108  Basophils Absolute Latest Ref Range: 0 - 200 cells/uL 50  Sed Rate Latest Ref Range: 0 - 20 mm/h 28 (H)   Assessment:   1. Acute  osteomyelitis of right ankle or foot ([place])   2. Hammer toe of right foot   3. Elevated C-reactive protein (CRP)   4. Elevated sed rate   5. Hallux valgus, left   6. Tobacco abuse    Plan:  Patient was evaluated and treated and all questions answered.  Chronic osteomyelitis right second toe -Labs reviewed as above.  White blood cell normal, elevated CRP elevated ESR.  These are concerning for infection given clinical context.  The infection does appear to be chronic however and currently quiescent. -He will benefit from bone biopsy prior to further discussion of revision.  For vision he ultimately would need right hallux valgus correction with second toe revision arthrodesis. -Patient has failed all conservative therapy and wishes to proceed with surgical intervention. All risks, benefits, and alternatives discussed with patient. No guarantees given. Consent reviewed and signed by patient. -Planned procedures: Right second toe bone biopsies. -Risk factors: Tobacco abuse  Return for post op.

## 2018-04-20 NOTE — Patient Instructions (Signed)
Pre-Operative Instructions  Congratulations, you have decided to take an important step towards improving your quality of life.  You can be assured that the doctors and staff at [facility] will be with you every step of the way.  Here are some important things you should know:  1. Plan to be at the surgery center/hospital at least 1 (one) hour prior to your scheduled time, unless otherwise directed by the surgical center/hospital staff.  You must have a responsible adult accompany you, remain during the surgery and drive you home.  Make sure you have directions to the surgical center/hospital to ensure you arrive on time. 2. If you are having surgery at [facility] or  hospitals, you will need a copy of your medical history and physical form from your family physician within one month prior to the date of surgery. We will give you a form for your primary physician to complete.  3. We make every effort to accommodate the date you request for surgery.  However, there are times where surgery dates or times have to be moved.  We will contact you as soon as possible if a change in schedule is required.   4. No aspirin/ibuprofen for one week before surgery.  If you are on aspirin, any non-steroidal anti-inflammatory medications (Mobic, Aleve, Ibuprofen) should not be taken seven (7) days prior to your surgery.  You make take Tylenol for pain prior to surgery.  5. Medications - If you are taking daily heart and blood pressure medications, seizure, reflux, allergy, asthma, anxiety, pain or diabetes medications, make sure you notify the surgery center/hospital before the day of surgery so they can tell you which medications you should take or avoid the day of surgery. 6. No food or drink after midnight the night before surgery unless directed otherwise by surgical center/hospital staff. 7. No alcoholic beverages 24-hours prior to surgery.  No smoking 24-hours prior or 24-hours after  surgery. 8. Wear loose pants or shorts. They should be loose enough to fit over bandages, boots, and casts. 9. Don't wear slip-on shoes. Sneakers are preferred. 10. Bring your boot with you to the surgery center/hospital.  Also bring crutches or a walker if your physician has prescribed it for you.  If you do not have this equipment, it will be provided for you after surgery. 11. If you have not been contacted by the surgery center/hospital by the day before your surgery, call to confirm the date and time of your surgery. 12. Leave-time from work may vary depending on the type of surgery you have.  Appropriate arrangements should be made prior to surgery with your employer. 13. Prescriptions will be provided immediately following surgery by your doctor.  Fill these as soon as possible after surgery and take the medication as directed. Pain medications will not be refilled on weekends and must be approved by the doctor. 14. Remove nail polish on the operative foot and avoid getting pedicures prior to surgery. 15. Wash the night before surgery.  The night before surgery wash the foot and leg well with water and the antibacterial soap provided. Be sure to pay special attention to beneath the toenails and in between the toes.  Wash for at least three (3) minutes. Rinse thoroughly with water and dry well with a towel.  Perform this wash unless told not to do so by your physician.  Enclosed: 1 Ice pack (please put in freezer the night before surgery)   1 Hibiclens skin cleaner     Pre-op instructions  If you have any questions regarding the instructions, please do not hesitate to call our office.  [place]: [address], , [place] [address] -- [phone]  [place]: [address] , [place] [address] -- [phone]  [place]: [address]  , [place] [address] -- [phone]  [place]: [address], [address], [place], [place] [address] -- [phone]  Website:  [email or web address] 

## 2018-04-25 ENCOUNTER — Telehealth: Payer: Self-pay | Admitting: *Deleted

## 2018-04-25 ENCOUNTER — Encounter (HOSPITAL_COMMUNITY): Payer: Self-pay | Admitting: Certified Registered"

## 2018-04-25 NOTE — Telephone Encounter (Signed)
I attempted to call the patient again.  I left him a message to call me within the next 15 minutes regarding the surgery.  If I don't hear from him, I am going to cancel the surgery for tomorrow.

## 2018-04-25 NOTE — Telephone Encounter (Addendum)
I attempted to call the patient's home number to reach him.  His sister advised me to call him on his mobile phone.  I attempted to reach him on his mobile.  He did not answer.  I left him a message to call me back.

## 2018-04-25 NOTE — Progress Notes (Signed)
Patient is scheduled for surgery10-2-19 with Dr March Rummage and there is not a current H&P on file. He had neck surgery 03-08-18 at [facility] and that is the most recent H&P. Delydia at Dr Price's office notified of need for H&P prior to surgery.

## 2018-04-25 NOTE — Telephone Encounter (Signed)
Noted thanks °

## 2018-04-25 NOTE — Telephone Encounter (Signed)
He does not have a current H&P in Epic.  He did have surgery on August 15 on his neck but there is no other H&P in there more recent than that.  He goes to the [facility].  So I don't know what to say.  If you can get him in over there and get an H&P before tomorrow, we can do his surgery.  Otherwise I don't see that happening tomorrow."

## 2018-04-25 NOTE — Telephone Encounter (Signed)
"  Have you heard anything from Mr. Caspers?"  I have not heard anything from him and I have called him repeatedly.  "I have too.  He's not going to get an appointment at [facility] during this short period of time."  I'm going to call and cancel it.     I called Maudie Mercury, surgery scheduler, and cancelled the surgery for Mr. Schlabach.

## 2018-04-26 ENCOUNTER — Encounter (HOSPITAL_BASED_OUTPATIENT_CLINIC_OR_DEPARTMENT_OTHER): Admission: RE | Payer: Self-pay | Source: Ambulatory Visit

## 2018-04-26 ENCOUNTER — Ambulatory Visit (HOSPITAL_BASED_OUTPATIENT_CLINIC_OR_DEPARTMENT_OTHER): Admission: RE | Admit: 2018-04-26 | Payer: Medicaid Other | Source: Ambulatory Visit | Admitting: Podiatry

## 2018-04-26 SURGERY — BIOPSY, BONE
Anesthesia: Monitor Anesthesia Care | Laterality: Right

## 2018-04-26 NOTE — Telephone Encounter (Signed)
I will cancel his postop appointments. Thank you.

## 2018-04-28 ENCOUNTER — Other Ambulatory Visit: Payer: Medicaid Other

## 2018-05-01 ENCOUNTER — Encounter: Payer: Self-pay | Admitting: *Deleted

## 2018-05-01 NOTE — Progress Notes (Signed)
[name] came by the office.  He stated he missed his surgery that was scheduled for April 26, 2018.  I rescheduled his surgery to October 23.  He has an appointment to see his primary care doctor on October 18.  I gave him the forms again that he needs to give to his doctor to complete.  He said he was going to take the forms to her office today.

## 2018-05-04 ENCOUNTER — Encounter: Payer: Medicaid Other | Admitting: Podiatry

## 2018-05-04 ENCOUNTER — Ambulatory Visit: Payer: Medicaid Other | Admitting: Podiatry

## 2018-05-04 DIAGNOSIS — M2041 Other hammer toe(s) (acquired), right foot: Secondary | ICD-10-CM | POA: Diagnosis not present

## 2018-05-04 DIAGNOSIS — M86671 Other chronic osteomyelitis, right ankle and foot: Secondary | ICD-10-CM | POA: Diagnosis not present

## 2018-05-04 DIAGNOSIS — M2012 Hallux valgus (acquired), left foot: Secondary | ICD-10-CM

## 2018-05-04 DIAGNOSIS — Z720 Tobacco use: Secondary | ICD-10-CM

## 2018-05-05 ENCOUNTER — Telehealth: Payer: Self-pay | Admitting: *Deleted

## 2018-05-05 NOTE — H&P (View-Only) (Signed)
  Subjective:  Patient ID: Calvin [name], male    DOB: 07-05-1961,  MRN: [id number]  Chief Complaint  Patient presents with  . Wound Check    right second toe - "it's completely healed"    57 y.o. male presents with the above complaint.  States the toe is healed.  Did not get his primary care doctor to see him prior to his surgical date and surgery was canceled.  Has appointment for follow-up and is scheduled for surgery in the coming weeks.  Past Medical History:  Diagnosis Date  . Arthritis    back, shoulders   . Constipation   . Disc disorder   . Hypertension   . Lumbar herniated disc   . Muscle spasm     Current Outpatient Medications:  .  amLODipine (NORVASC) 10 MG tablet, Take 1 tablet (10 mg total) by mouth daily., Disp: 90 tablet, Rfl: 3 .  Aspirin-Salicylamide-Caffeine (BC FAST PAIN RELIEF) 650-195-33.3 MG PACK, Take 2 Packages by mouth every 6 (six) hours as needed (headaches). , Disp: , Rfl:  .  cyclobenzaprine (FLEXERIL) 5 MG tablet, Take 1 tablet (5 mg total) by mouth 2 (two) times daily as needed for muscle spasms., Disp: 30 tablet, Rfl: 0 .  DULoxetine (CYMBALTA) 30 MG capsule, 1 tab PO daily x 3 wks then 2 tabs daily. (Patient not taking: Reported on 03/06/2018), Disp: 60 capsule, Rfl: 2 .  hydrochlorothiazide (HYDRODIURIL) 25 MG tablet, Take 1 tablet (25 mg total) by mouth daily., Disp: 90 tablet, Rfl: 3 .  Multiple Vitamin (MULTIVITAMIN) tablet, Take 1 tablet by mouth daily., Disp: , Rfl:  .  sildenafil (VIAGRA) 100 MG tablet, 1 tab PO 1/2 hr before intercourse PRN.  Limit use to 1 tab /24 hr period. (Patient taking differently: Take 100 mg by mouth as needed for erectile dysfunction (30 minutes before intercourse.). ), Disp: 30 tablet, Rfl: 3  Social History   Tobacco Use  Smoking Status Current Every Day Smoker  . Packs/day: 0.10  . Years: 20.00  . Pack years: 2.00  . Types: Cigarettes  Smokeless Tobacco Never Used  Tobacco Comment   5 cigs a day     No  Known Allergies Objective:   There were no vitals filed for this visit. There is no height or weight on file to calculate BMI. Constitutional Well developed. Well nourished.  Vascular Dorsalis pedis pulses palpable bilaterally. Posterior tibial pulses palpable bilaterally. Capillary refill normal to all digits.  No cyanosis or clubbing noted. Pedal hair growth normal.  Neurologic Normal speech. Oriented to person, place, and time. Epicritic sensation to light touch grossly present bilaterally.  Dermatologic Nails well groomed and normal in appearance. No open wounds.  Left second darkened without open ulceration No skin lesions.  Orthopedic: Normal joint ROM without pain or crepitus bilaterally. 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 No bony tenderness.   Assessment:   1. Chronic osteomyelitis of ankle and foot, right ([place])   2. Hammer toe of right foot   3. Hallux valgus, left   4. Tobacco abuse    Plan:  Patient was evaluated and treated and all questions answered.  Chronic osteomyelitis right second toe -No open ulceration noted today -Patient is pending bone biopsy. -Depending on results biopsy would consider revision. -Planned procedures: Right second toe bone biopsies. -Risk factors: Tobacco abuse  No follow-ups on file.

## 2018-05-05 NOTE — Progress Notes (Signed)
  Subjective:  Patient ID: Calvin [name], male    DOB: 1961-05-24,  MRN: [id number]  Chief Complaint  Patient presents with  . Wound Check    right second toe - "it's completely healed"    57 y.o. male presents with the above complaint.  States the toe is healed.  Did not get his primary care doctor to see him prior to his surgical date and surgery was canceled.  Has appointment for follow-up and is scheduled for surgery in the coming weeks.  Past Medical History:  Diagnosis Date  . Arthritis    back, shoulders   . Constipation   . Disc disorder   . Hypertension   . Lumbar herniated disc   . Muscle spasm     Current Outpatient Medications:  .  amLODipine (NORVASC) 10 MG tablet, Take 1 tablet (10 mg total) by mouth daily., Disp: 90 tablet, Rfl: 3 .  Aspirin-Salicylamide-Caffeine (BC FAST PAIN RELIEF) 650-195-33.3 MG PACK, Take 2 Packages by mouth every 6 (six) hours as needed (headaches). , Disp: , Rfl:  .  cyclobenzaprine (FLEXERIL) 5 MG tablet, Take 1 tablet (5 mg total) by mouth 2 (two) times daily as needed for muscle spasms., Disp: 30 tablet, Rfl: 0 .  DULoxetine (CYMBALTA) 30 MG capsule, 1 tab PO daily x 3 wks then 2 tabs daily. (Patient not taking: Reported on 03/06/2018), Disp: 60 capsule, Rfl: 2 .  hydrochlorothiazide (HYDRODIURIL) 25 MG tablet, Take 1 tablet (25 mg total) by mouth daily., Disp: 90 tablet, Rfl: 3 .  Multiple Vitamin (MULTIVITAMIN) tablet, Take 1 tablet by mouth daily., Disp: , Rfl:  .  sildenafil (VIAGRA) 100 MG tablet, 1 tab PO 1/2 hr before intercourse PRN.  Limit use to 1 tab /24 hr period. (Patient taking differently: Take 100 mg by mouth as needed for erectile dysfunction (30 minutes before intercourse.). ), Disp: 30 tablet, Rfl: 3  Social History   Tobacco Use  Smoking Status Current Every Day Smoker  . Packs/day: 0.10  . Years: 20.00  . Pack years: 2.00  . Types: Cigarettes  Smokeless Tobacco Never Used  Tobacco Comment   5 cigs a day     No  Known Allergies Objective:   There were no vitals filed for this visit. There is no height or weight on file to calculate BMI. Constitutional Well developed. Well nourished.  Vascular Dorsalis pedis pulses palpable bilaterally. Posterior tibial pulses palpable bilaterally. Capillary refill normal to all digits.  No cyanosis or clubbing noted. Pedal hair growth normal.  Neurologic Normal speech. Oriented to person, place, and time. Epicritic sensation to light touch grossly present bilaterally.  Dermatologic Nails well groomed and normal in appearance. No open wounds.  Left second darkened without open ulceration No skin lesions.  Orthopedic: Normal joint ROM without pain or crepitus bilaterally. 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 No bony tenderness.   Assessment:   1. Chronic osteomyelitis of ankle and foot, right ([place])   2. Hammer toe of right foot   3. Hallux valgus, left   4. Tobacco abuse    Plan:  Patient was evaluated and treated and all questions answered.  Chronic osteomyelitis right second toe -No open ulceration noted today -Patient is pending bone biopsy. -Depending on results biopsy would consider revision. -Planned procedures: Right second toe bone biopsies. -Risk factors: Tobacco abuse  No follow-ups on file.

## 2018-05-05 NOTE — Telephone Encounter (Signed)
I left messages for the patient to give me a call back.  I want to inform him that his surgery scheduled for 05/17/2018 has been moved to [place] from [place].  I asked him to give me a call back or his sister.

## 2018-05-11 ENCOUNTER — Encounter: Payer: Medicaid Other | Admitting: Internal Medicine

## 2018-05-11 NOTE — Telephone Encounter (Signed)
I attempted to call the patient again.  I left messages for him to call me back.

## 2018-05-15 ENCOUNTER — Ambulatory Visit: Payer: Medicaid Other | Attending: Internal Medicine | Admitting: Internal Medicine

## 2018-05-15 ENCOUNTER — Encounter: Payer: Self-pay | Admitting: Internal Medicine

## 2018-05-15 VITALS — BP 140/86 | HR 87 | Temp 99.6°F | Resp 16 | Ht 72.0 in | Wt 158.2 lb

## 2018-05-15 DIAGNOSIS — F1490 Cocaine use, unspecified, uncomplicated: Secondary | ICD-10-CM | POA: Diagnosis not present

## 2018-05-15 DIAGNOSIS — G8929 Other chronic pain: Secondary | ICD-10-CM | POA: Insufficient documentation

## 2018-05-15 DIAGNOSIS — Z8249 Family history of ischemic heart disease and other diseases of the circulatory system: Secondary | ICD-10-CM | POA: Insufficient documentation

## 2018-05-15 DIAGNOSIS — Z23 Encounter for immunization: Secondary | ICD-10-CM | POA: Diagnosis not present

## 2018-05-15 DIAGNOSIS — Z720 Tobacco use: Secondary | ICD-10-CM

## 2018-05-15 DIAGNOSIS — F329 Major depressive disorder, single episode, unspecified: Secondary | ICD-10-CM

## 2018-05-15 DIAGNOSIS — N529 Male erectile dysfunction, unspecified: Secondary | ICD-10-CM | POA: Insufficient documentation

## 2018-05-15 DIAGNOSIS — Z79899 Other long term (current) drug therapy: Secondary | ICD-10-CM | POA: Diagnosis not present

## 2018-05-15 DIAGNOSIS — E559 Vitamin D deficiency, unspecified: Secondary | ICD-10-CM | POA: Insufficient documentation

## 2018-05-15 DIAGNOSIS — F17210 Nicotine dependence, cigarettes, uncomplicated: Secondary | ICD-10-CM | POA: Diagnosis not present

## 2018-05-15 DIAGNOSIS — Z01818 Encounter for other preprocedural examination: Secondary | ICD-10-CM

## 2018-05-15 DIAGNOSIS — M5416 Radiculopathy, lumbar region: Secondary | ICD-10-CM | POA: Diagnosis not present

## 2018-05-15 DIAGNOSIS — I10 Essential (primary) hypertension: Secondary | ICD-10-CM | POA: Diagnosis not present

## 2018-05-15 DIAGNOSIS — M545 Low back pain: Secondary | ICD-10-CM | POA: Insufficient documentation

## 2018-05-15 DIAGNOSIS — Z7982 Long term (current) use of aspirin: Secondary | ICD-10-CM | POA: Diagnosis not present

## 2018-05-15 DIAGNOSIS — Z0000 Encounter for general adult medical examination without abnormal findings: Secondary | ICD-10-CM | POA: Diagnosis present

## 2018-05-15 DIAGNOSIS — R4589 Other symptoms and signs involving emotional state: Secondary | ICD-10-CM

## 2018-05-15 MED ORDER — SERTRALINE HCL 50 MG PO TABS: ORAL_TABLET | ORAL | 2 refills | Status: DC

## 2018-05-15 MED FILL — SERTRALINE HCL 50 MG TABS: 50 | 30 days supply | Qty: 30 | Fill #0

## 2018-05-15 NOTE — Progress Notes (Signed)
Patient ID: Calvin [name], male    DOB: [birth year]  MRN: [id number]  CC: Annual Exam   Subjective: Calvin [name] is a 57 y.o. male who presents for chronic ds management. His concerns today include:  Pt with hx of HTN, chronic LBP, ED, tobacco, cocaine use, depression  pt scheduled for bone bx of RT 2nd toe by Dr. March Rummage on 05/17/2018.  Presents today for pre-op H&P. Pt sustained injury to the right second toe in June of this year.  Imaging of the foot revealed that he had a healing fracture of the second toe.  Had surgery on RT 2 toe in 2003. Fusion was done.  When I saw him in 02/2018, I was concern about the appearance of the toe and referred him to Dr. March Rummage  HTN: Forgot to take meds today and yesterday.  Limits salt in foods. No CP/SOB/LE/HA  Tob Dep:  Down to 2 cig a day.  Plans to start the patches after bone bx surgery.    Called to schedule c-scopy.  He decided to put it off until after foot surgery.   Depression: He feels it is a little worse lately  Attributes it to current health issues.  He stopped taking Cymbalta because he did not feel it was helping him anymore.  Patient Active Problem List   Diagnosis Date Noted  . Radiculopathy 03/08/2018  . Tubular adenoma 05/16/2017  . Depressed mood 05/16/2017  . Substance abuse ([place]) 05/16/2017  . Hallux valgus 08/03/2016  . Disc disorder   . Lumbar facet arthropathy 08/06/2014  . Lumbar radiculopathy 08/06/2014  . ED (erectile dysfunction) 01/07/2014  . Unspecified vitamin D deficiency 01/07/2014  . Left shoulder pain 08/10/2013  . Chronic low back pain 08/10/2013  . Essential hypertension 08/10/2013  . Tobacco abuse 08/10/2013     Current Outpatient Medications on File Prior to Visit  Medication Sig Dispense Refill  . amLODipine (NORVASC) 10 MG tablet Take 1 tablet (10 mg total) by mouth daily. 90 tablet 3  . Aspirin-Salicylamide-Caffeine (BC FAST PAIN RELIEF) 650-195-33.3 MG PACK Take 2 Packages by mouth every 6 (six)  hours as needed (headaches).     . cyclobenzaprine (FLEXERIL) 5 MG tablet Take 1 tablet (5 mg total) by mouth 2 (two) times daily as needed for muscle spasms. 30 tablet 0  . hydrochlorothiazide (HYDRODIURIL) 25 MG tablet Take 1 tablet (25 mg total) by mouth daily. 90 tablet 3  . Multiple Vitamin (MULTIVITAMIN) tablet Take 1 tablet by mouth daily.    . sildenafil (VIAGRA) 100 MG tablet 1 tab PO 1/2 hr before intercourse PRN.  Limit use to 1 tab /24 hr period. (Patient taking differently: Take 100 mg by mouth as needed for erectile dysfunction (30 minutes before intercourse.). ) 30 tablet 3   No current facility-administered medications on file prior to visit.     No Known Allergies  Social History   Socioeconomic History  . Marital status: Single    Spouse name: Not on file  . Number of children: Not on file  . Years of education: Not on file  . Highest education level: Not on file  Occupational History  . Not on file  Social Needs  . Financial resource strain: Not on file  . Food insecurity:    Worry: Not on file    Inability: Not on file  . Transportation needs:    Medical: Not on file    Non-medical: Not on file  Tobacco Use  . Smoking  status: Current Every Day Smoker    Packs/day: 0.10    Years: 20.00    Pack years: 2.00    Types: Cigarettes  . Smokeless tobacco: Never Used  . Tobacco comment: 5 cigs a day   Substance and Sexual Activity  . Alcohol use: No  . Drug use: No  . Sexual activity: Not on file  Lifestyle  . Physical activity:    Days per week: Not on file    Minutes per session: Not on file  . Stress: Not on file  Relationships  . Social connections:    Talks on phone: Not on file    Gets together: Not on file    Attends religious service: Not on file    Active member of club or organization: Not on file    Attends meetings of clubs or organizations: Not on file    Relationship status: Not on file  . Intimate partner violence:    Fear of current or  ex partner: Not on file    Emotionally abused: Not on file    Physically abused: Not on file    Forced sexual activity: Not on file  Other Topics Concern  . Not on file  Social History Narrative  . Not on file    Family History  Problem Relation Age of Onset  . Diabetes Mother   . Hypertension Mother   . Heart disease Mother   . Hyperlipidemia Mother   . Diabetes Brother   . Diabetes Daughter   . Hypertension Sister   . Diabetes Sister   . Stomach cancer Maternal Grandfather   . Colon cancer Neg Hx   . Esophageal cancer Neg Hx   . Rectal cancer Neg Hx   . Colon polyps Neg Hx     Past Surgical History:  Procedure Laterality Date  . ANTERIOR CERVICAL DECOMP/DISCECTOMY FUSION N/A 03/08/2018   Procedure: ANTERIOR CERVICAL DECOMPRESSION/DISCECTOMY FUSION , cervical 3-4, cervical 4-5, cervical 5-6 with intrumentational and allograft;  Surgeon: Phylliss Bob, MD;  Location: [facility];  Service: Orthopedics;  Laterality: N/A;  . COLONOSCOPY    . FOOT SURGERY     right, ~ 2002  . POLYPECTOMY      ROS: Review of Systems Negative except as PHYSICAL EXAM: BP 140/86   Pulse 87   Temp 99.6 F (37.6 C) (Oral)   Resp 16   Ht 6' (1.829 m)   Wt 158 lb 3.2 oz (71.8 kg)   SpO2 99%   BMI 21.46 kg/m   Physical Exam General appearance - alert, well appearing, and in no distress Mental status - normal mood, behavior, speech, dress, motor activity, and thought processes Eyes: nonicteric sclera Nose - normal and patent, no erythema, discharge or polyps Mouth - mucous membranes moist, pharynx normal without lesions Neck - supple, no significant adenopathy.  No thyroid enlargement Chest - clear to auscultation, no wheezes, rales or rhonchi, symmetric air entry Heart - normal rate, regular rhythm, normal S1, S2, no murmurs, rubs, clicks or gallops Extremities - peripheral pulses normal, no pedal edema, no clubbing or cyanosis MSK:  RT 2nd toe:  -RT foot:  2 nd toe -mild edema.  No  erythema.  Decreased sensation  ASSESSMENT AND PLAN: 1. Pre-op examination Pt clinically stable for bone biopsy procedure. Can move forward with having this done as planned for later this wk  2. Essential hypertension Not at goal. Discussed importance of med compliance to prevent complications of uncontrolled BP   3. Tobacco  abuse Advised to quit.  He wants to and plans to start using nicotine patches after bone bx procedure  4. Depressed mood We discuss trying him with a different antidepressant.  Pt willing to try Zoloft.  No SI at this time - sertraline (ZOLOFT) 50 MG tablet; Half a tablet p.o. daily x2 weeks.  Then 1 tab p.o. daily  Dispense: 30 tablet; Refill: 2  5. Need for immunization against influenza - Flu Vaccine QUAD 36+ mos IM  Patient was given the opportunity to ask questions.  Patient verbalized understanding of the plan and was able to repeat key elements of the plan.   Orders Placed This Encounter  Procedures  . Flu Vaccine QUAD 36+ mos IM     Requested Prescriptions   Signed Prescriptions Disp Refills  . sertraline (ZOLOFT) 50 MG tablet 30 tablet 2    Sig: Half a tablet p.o. daily x2 weeks.  Then 1 tab p.o. daily    Return in about 3 months (around 08/15/2018).  Karle Plumber, MD, FACP

## 2018-05-15 NOTE — Progress Notes (Signed)
Spoke with patients sister via telephone. She stated that patietn was unable to get appointment with his primary to have his H&P paperwork filled out until next month. Patients sister gave me a different telephone number to try and reach patient but had to leave a message. Also called surgery scheduler [facility] and left message to make her aware he was unable to get appointment.

## 2018-05-15 NOTE — Telephone Encounter (Addendum)
"  I'm calling about [name].  He's scheduled at [facility] this Wednesday with Dr. March Rummage.  I just called the patient and he has said that he's unable to get an appointment to get his H&P form filled out until next month.  I talked to his sister and she told me to try and call him.  I have tried to call him several times today.  I've left messages but I have not heard from him.  So he's going to need to be rescheduled."  I attempted to call the patient.  He did not answer.  I couldn't leave a message because his mailbox was full.  I called his sister and she gave me another number so I can reach him.  She said he is at the doctor's office right now to get his form completed.  She asked me to call him at [phone].  I called and informed Calvin [name] that the patient was seeing his primary care physician today.

## 2018-05-15 NOTE — Patient Instructions (Addendum)
Please take your blood pressure medications every day in the mornings.  I have sent a prescription to our pharmacy for the Zoloft to help with depression.  You should start this medication after your foot surgery.   Influenza Virus Vaccine injection (Fluarix) What is this medicine? INFLUENZA VIRUS VACCINE (in floo EN zuh VAHY ruhs vak SEEN) helps to reduce the risk of getting influenza also known as the flu. This medicine may be used for other purposes; ask your health care provider or pharmacist if you have questions. COMMON BRAND NAME(S): Fluarix, Fluzone What should I tell my health care provider before I take this medicine? They need to know if you have any of these conditions: -bleeding disorder like hemophilia -fever or infection -Guillain-Barre syndrome or other neurological problems -immune system problems -infection with the human immunodeficiency virus (HIV) or AIDS -low blood platelet counts -multiple sclerosis -an unusual or allergic reaction to influenza virus vaccine, eggs, chicken proteins, latex, gentamicin, other medicines, foods, dyes or preservatives -pregnant or trying to get pregnant -breast-feeding How should I use this medicine? This vaccine is for injection into a muscle. It is given by a health care professional. A copy of Vaccine Information Statements will be given before each vaccination. Read this sheet carefully each time. The sheet may change frequently. Talk to your pediatrician regarding the use of this medicine in children. Special care may be needed. Overdosage: If you think you have taken too much of this medicine contact a poison control center or emergency room at once. NOTE: This medicine is only for you. Do not share this medicine with others. What if I miss a dose? This does not apply. What may interact with this medicine? -chemotherapy or radiation therapy -medicines that lower your immune system like etanercept, anakinra, infliximab, and  adalimumab -medicines that treat or prevent blood clots like warfarin -phenytoin -steroid medicines like prednisone or cortisone -theophylline -vaccines This list may not describe all possible interactions. Give your health care provider a list of all the medicines, herbs, non-prescription drugs, or dietary supplements you use. Also tell them if you smoke, drink alcohol, or use illegal drugs. Some items may interact with your medicine. What should I watch for while using this medicine? Report any side effects that do not go away within 3 days to your doctor or health care professional. Call your health care provider if any unusual symptoms occur within 6 weeks of receiving this vaccine. You may still catch the flu, but the illness is not usually as bad. You cannot get the flu from the vaccine. The vaccine will not protect against colds or other illnesses that may cause fever. The vaccine is needed every year. What side effects may I notice from receiving this medicine? Side effects that you should report to your doctor or health care professional as soon as possible: -allergic reactions like skin rash, itching or hives, swelling of the face, lips, or tongue Side effects that usually do not require medical attention (report to your doctor or health care professional if they continue or are bothersome): -fever -headache -muscle aches and pains -pain, tenderness, redness, or swelling at site where injected -weak or tired This list may not describe all possible side effects. Call your doctor for medical advice about side effects. You may report side effects to FDA at 1-800-FDA-1088. Where should I keep my medicine? This vaccine is only given in a clinic, pharmacy, doctor's office, or other health care setting and will not be stored at home. NOTE:  This sheet is a summary. It may not cover all possible information. If you have questions about this medicine, talk to your doctor, pharmacist, or health  care provider.  2018 Elsevier/Gold Standard (2008-02-07 09:30:40)

## 2018-05-16 ENCOUNTER — Encounter (HOSPITAL_BASED_OUTPATIENT_CLINIC_OR_DEPARTMENT_OTHER): Payer: Self-pay | Admitting: *Deleted

## 2018-05-16 NOTE — Progress Notes (Signed)
Spoke with patient via telephone for pre op interview. No medications in AM. NPO after MN. Needs ISTAT8. Patient to arrive at 1000.

## 2018-05-16 NOTE — Anesthesia Preprocedure Evaluation (Addendum)
Anesthesia Evaluation    Reviewed: Allergy & Precautions, Patient's Chart, lab work & pertinent test results  Airway Mallampati: II  TM Distance: >3 FB Neck ROM: Full    Dental no notable dental hx. (+) Teeth Intact, Dental Advisory Given   Pulmonary Current Smoker,    Pulmonary exam normal breath sounds clear to auscultation       Cardiovascular hypertension, Pt. on medications negative cardio ROS Normal cardiovascular exam Rhythm:Regular Rate:Normal     Neuro/Psych  Neuromuscular disease negative psych ROS   GI/Hepatic negative GI ROS, Neg liver ROS,   Endo/Other  negative endocrine ROS  Renal/GU negative Renal ROS     Musculoskeletal negative musculoskeletal ROS (+)   Abdominal   Peds  Hematology negative hematology ROS (+)   Anesthesia Other Findings   Reproductive/Obstetrics negative OB ROS                            Anesthesia Physical Anesthesia Plan  ASA: II  Anesthesia Plan: MAC   Post-op Pain Management:    Induction: Intravenous  PONV Risk Score and Plan: 1 and Treatment may vary due to age or medical condition, Ondansetron and Dexamethasone  Airway Management Planned: Natural Airway and Nasal Cannula  Additional Equipment:   Intra-op Plan:   Post-operative Plan:   Informed Consent: I have reviewed the patients History and Physical, chart, labs and discussed the procedure including the risks, benefits and alternatives for the proposed anesthesia with the patient or authorized representative who has indicated his/her understanding and acceptance.   Dental advisory given  Plan Discussed with:   Anesthesia Plan Comments:        Anesthesia Quick Evaluation

## 2018-05-17 ENCOUNTER — Ambulatory Visit (HOSPITAL_BASED_OUTPATIENT_CLINIC_OR_DEPARTMENT_OTHER): Payer: Medicaid Other | Admitting: Anesthesiology

## 2018-05-17 ENCOUNTER — Encounter (HOSPITAL_BASED_OUTPATIENT_CLINIC_OR_DEPARTMENT_OTHER)
Admission: RE | Disposition: A | Discharge status: Home / Self Care | Payer: Self-pay | Source: Ambulatory Visit | Attending: Podiatry

## 2018-05-17 ENCOUNTER — Other Ambulatory Visit: Payer: Self-pay

## 2018-05-17 ENCOUNTER — Ambulatory Visit (HOSPITAL_BASED_OUTPATIENT_CLINIC_OR_DEPARTMENT_OTHER)
Admission: RE | Admit: 2018-05-17 | Discharge: 2018-05-17 | Disposition: A | Discharge status: Home / Self Care | Payer: Medicaid Other | Source: Ambulatory Visit | Attending: Podiatry | Admitting: Podiatry

## 2018-05-17 ENCOUNTER — Encounter (HOSPITAL_BASED_OUTPATIENT_CLINIC_OR_DEPARTMENT_OTHER): Payer: Self-pay | Admitting: Anesthesiology

## 2018-05-17 ENCOUNTER — Other Ambulatory Visit: Payer: Self-pay | Admitting: Internal Medicine

## 2018-05-17 DIAGNOSIS — Z79899 Other long term (current) drug therapy: Secondary | ICD-10-CM | POA: Diagnosis not present

## 2018-05-17 DIAGNOSIS — Z7982 Long term (current) use of aspirin: Secondary | ICD-10-CM | POA: Diagnosis not present

## 2018-05-17 DIAGNOSIS — M86671 Other chronic osteomyelitis, right ankle and foot: Secondary | ICD-10-CM

## 2018-05-17 DIAGNOSIS — S91311A Laceration without foreign body, right foot, initial encounter: Secondary | ICD-10-CM

## 2018-05-17 DIAGNOSIS — I10 Essential (primary) hypertension: Secondary | ICD-10-CM | POA: Diagnosis not present

## 2018-05-17 DIAGNOSIS — F17210 Nicotine dependence, cigarettes, uncomplicated: Secondary | ICD-10-CM | POA: Diagnosis not present

## 2018-05-17 DIAGNOSIS — M5416 Radiculopathy, lumbar region: Secondary | ICD-10-CM

## 2018-05-17 HISTORY — DX: Anxiety disorder, unspecified: F41.9

## 2018-05-17 HISTORY — DX: Depression, unspecified: F32.A

## 2018-05-17 HISTORY — PX: BONE BIOPSY: SHX375

## 2018-05-17 HISTORY — DX: Major depressive disorder, single episode, unspecified: F32.9

## 2018-05-17 LAB — POCT I-STAT, CHEM 8
BUN: 10 mg/dL (ref 6–20)
Calcium, Ion: 1.21 mmol/L (ref 1.15–1.40)
Chloride: 96 mmol/L — ABNORMAL LOW (ref 98–111)
Creatinine, Ser: 1.2 mg/dL (ref 0.61–1.24)
Glucose, Bld: 82 mg/dL (ref 70–99)
HCT: 36 % — ABNORMAL LOW (ref 39.0–52.0)
Hemoglobin: 12.2 g/dL — ABNORMAL LOW (ref 13.0–17.0)
Potassium: 3.8 mmol/L (ref 3.5–5.1)
Sodium: 134 mmol/L — ABNORMAL LOW (ref 135–145)
TCO2: 28 mmol/L (ref 22–32)

## 2018-05-17 SURGERY — BIOPSY, BONE
Anesthesia: Monitor Anesthesia Care | Site: Toe | Laterality: Right

## 2018-05-17 MED ORDER — LIDOCAINE HCL (CARDIAC) PF 100 MG/5ML IV SOSY
PREFILLED_SYRINGE | INTRAVENOUS | Status: DC | PRN
  Administered 2018-05-17: 50 mg via INTRAVENOUS

## 2018-05-17 MED ORDER — BUPIVACAINE HCL (PF) 0.5 % IJ SOLN
INTRAMUSCULAR | Status: DC | PRN
  Administered 2018-05-17: 10 mL

## 2018-05-17 MED ORDER — PROPOFOL 10 MG/ML IV BOLUS
INTRAVENOUS | Status: DC | PRN
  Administered 2018-05-17: 20 mg via INTRAVENOUS
  Administered 2018-05-17: 30 mg via INTRAVENOUS
  Administered 2018-05-17 (×5): 20 mg via INTRAVENOUS

## 2018-05-17 MED ORDER — LACTATED RINGERS IV SOLN
INTRAVENOUS | Status: DC
  Administered 2018-05-17 (×2): via INTRAVENOUS
  Filled 2018-05-17: qty 1000

## 2018-05-17 MED ORDER — MIDAZOLAM HCL 5 MG/5ML IJ SOLN
INTRAMUSCULAR | Status: DC | PRN
  Administered 2018-05-17: 2 mg via INTRAVENOUS

## 2018-05-17 MED ORDER — HYDROCODONE-ACETAMINOPHEN 10-325 MG PO TABS: 1.0000 | ORAL_TABLET | ORAL | 0 refills | Status: DC | PRN

## 2018-05-17 MED ORDER — CEPHALEXIN 500 MG PO CAPS: 500.0000 mg | ORAL_CAPSULE | Freq: Two times a day (BID) | ORAL | 0 refills | Status: DC

## 2018-05-17 MED ORDER — ACETAMINOPHEN 500 MG PO TABS
1000.0000 mg | ORAL_TABLET | Freq: Once | ORAL | Status: AC
  Administered 2018-05-17: 1000 mg via ORAL
  Filled 2018-05-17: qty 2

## 2018-05-17 MED ORDER — MIDAZOLAM HCL 2 MG/2ML IJ SOLN
INTRAMUSCULAR | Status: AC
  Filled 2018-05-17: qty 2

## 2018-05-17 MED ORDER — ACETAMINOPHEN 500 MG PO TABS
ORAL_TABLET | ORAL | Status: AC
  Filled 2018-05-17: qty 2

## 2018-05-17 MED ORDER — FENTANYL CITRATE (PF) 100 MCG/2ML IJ SOLN
25.0000 ug | INTRAMUSCULAR | Status: DC | PRN
  Filled 2018-05-17: qty 1

## 2018-05-17 MED ORDER — FENTANYL CITRATE (PF) 100 MCG/2ML IJ SOLN
INTRAMUSCULAR | Status: DC | PRN
  Administered 2018-05-17 (×2): 50 ug via INTRAVENOUS

## 2018-05-17 MED ORDER — ONDANSETRON HCL 4 MG/2ML IJ SOLN
4.0000 mg | Freq: Once | INTRAMUSCULAR | Status: DC | PRN
  Filled 2018-05-17: qty 2

## 2018-05-17 MED ORDER — KETOROLAC TROMETHAMINE 15 MG/ML IJ SOLN
15.0000 mg | Freq: Once | INTRAMUSCULAR | Status: DC | PRN
  Filled 2018-05-17: qty 1

## 2018-05-17 MED ORDER — FENTANYL CITRATE (PF) 100 MCG/2ML IJ SOLN
INTRAMUSCULAR | Status: AC
  Filled 2018-05-17: qty 2

## 2018-05-17 MED FILL — HYDROCHLOROTHIAZIDE 25 MG T: 25 | 30 days supply | Qty: 30 | Fill #2

## 2018-05-17 MED FILL — AMLODIPINE BESYLATE 10 MG T: 10 | 30 days supply | Qty: 30 | Fill #2

## 2018-05-17 SURGICAL SUPPLY — 48 items
BANDAGE ACE 3X5.8 VEL STRL LF (GAUZE/BANDAGES/DRESSINGS) ×3 IMPLANT
BANDAGE ACE 4X5 VEL STRL LF (GAUZE/BANDAGES/DRESSINGS) IMPLANT
BLADE HEX COATED 2.75 (ELECTRODE) IMPLANT
BLADE SURG 15 STRL LF DISP TIS (BLADE) ×1 IMPLANT
BLADE SURG 15 STRL SS (BLADE) ×3
BNDG CMPR 9X4 STRL LF SNTH (GAUZE/BANDAGES/DRESSINGS) ×1
BNDG ESMARK 4X9 LF (GAUZE/BANDAGES/DRESSINGS) ×3 IMPLANT
BNDG GAUZE ELAST 4 BULKY (GAUZE/BANDAGES/DRESSINGS) ×3 IMPLANT
CHLORAPREP W/TINT 26ML (MISCELLANEOUS) ×3 IMPLANT
COVER BACK TABLE 60X90IN (DRAPES) ×3 IMPLANT
CUFF TOURNIQUET SINGLE 18IN (TOURNIQUET CUFF) IMPLANT
DRAPE EXTREMITY T 121X128X90 (DRAPE) ×3 IMPLANT
DRAPE SHEET LG 3/4 BI-LAMINATE (DRAPES) ×3 IMPLANT
DRAPE U-SHAPE 47X51 STRL (DRAPES) ×3 IMPLANT
DRSG EMULSION OIL 3X3 NADH (GAUZE/BANDAGES/DRESSINGS) IMPLANT
DRSG PAD ABDOMINAL 8X10 ST (GAUZE/BANDAGES/DRESSINGS) IMPLANT
ELECT REM PT RETURN 9FT ADLT (ELECTROSURGICAL) ×3
ELECTRODE REM PT RTRN 9FT ADLT (ELECTROSURGICAL) ×1 IMPLANT
Economy Jamshidi bone marrow biopsy and aspiration tray ×2 IMPLANT
GAUZE SPONGE 4X4 12PLY STRL (GAUZE/BANDAGES/DRESSINGS) ×3 IMPLANT
GAUZE XEROFORM 1X8 LF (GAUZE/BANDAGES/DRESSINGS) ×2 IMPLANT
GLOVE BIO SURGEON STRL SZ7.5 (GLOVE) ×3 IMPLANT
GLOVE BIOGEL PI IND STRL 8 (GLOVE) ×1 IMPLANT
GLOVE BIOGEL PI INDICATOR 8 (GLOVE) ×2
GOWN STRL REUS W/ TWL XL LVL3 (GOWN DISPOSABLE) ×1 IMPLANT
GOWN STRL REUS W/TWL XL LVL3 (GOWN DISPOSABLE) ×3
MANIFOLD NEPTUNE II (INSTRUMENTS) ×3 IMPLANT
NDL HYPO 25X1 1.5 SAFETY (NEEDLE) ×1 IMPLANT
NEEDLE HYPO 25X1 1.5 SAFETY (NEEDLE) ×3 IMPLANT
NS IRRIG 1000ML POUR BTL (IV SOLUTION) IMPLANT
PACK BASIN DAY SURGERY FS (CUSTOM PROCEDURE TRAY) ×3 IMPLANT
PADDING CAST ABS 4INX4YD NS (CAST SUPPLIES) ×2
PADDING CAST ABS COTTON 4X4 ST (CAST SUPPLIES) ×1 IMPLANT
PENCIL BUTTON HOLSTER BLD 10FT (ELECTRODE) IMPLANT
SET IRRIG Y TYPE TUR BLADDER L (SET/KITS/TRAYS/PACK) IMPLANT
STOCKINETTE 6  STRL (DRAPES) ×2
STOCKINETTE 6 STRL (DRAPES) ×1 IMPLANT
SUT ETHILON 4 0 PS 2 18 (SUTURE) IMPLANT
SUT MNCRL AB 3-0 PS2 18 (SUTURE) IMPLANT
SUT MNCRL AB 4-0 PS2 18 (SUTURE) IMPLANT
SUT MON AB 5-0 PS2 18 (SUTURE) IMPLANT
SUT VIC AB 3-0 FS2 27 (SUTURE) IMPLANT
SUT VICRYL 4-0 PS2 18IN ABS (SUTURE) IMPLANT
SYR BULB 3OZ (MISCELLANEOUS) ×3 IMPLANT
SYR CONTROL 10ML LL (SYRINGE) ×3 IMPLANT
TRAY BIOPSY JAMSHIDI 11X4 J-ST (NEEDLE) ×2 IMPLANT
UNDERPAD 30X30 (UNDERPADS AND DIAPERS) ×3 IMPLANT
YANKAUER SUCT BULB TIP NO VENT (SUCTIONS) ×3 IMPLANT

## 2018-05-17 NOTE — Discharge Instructions (Signed)
[facility] DISCHARGE INSTRUCTIONS    Post Anesthesia Home Care Instructions  Activity: Get plenty of rest for the remainder of the day. A responsible individual must stay with you for 24 hours following the procedure.  For the next 24 hours, DO NOT: -Drive a car -Paediatric nurse -Drink alcoholic beverages -Take any medication unless instructed by your physician -Make any legal decisions or sign important papers.  Meals: Start with liquid foods such as gelatin or soup. Progress to regular foods as tolerated. Avoid greasy, spicy, heavy foods. If nausea and/or vomiting occur, drink only clear liquids until the nausea and/or vomiting subsides. Call your physician if vomiting continues.  Special Instructions/Symptoms: Your throat may feel dry or sore from the anesthesia or the breathing tube placed in your throat during surgery. If this causes discomfort, gargle with warm salt water. The discomfort should disappear within 24 hours.  If you had a scopolamine patch placed behind your ear for the management of post- operative nausea and/or vomiting:  1. The medication in the patch is effective for 72 hours, after which it should be removed.  Wrap patch in a tissue and discard in the trash. Wash hands thoroughly with soap and water. 2. You may remove the patch earlier than 72 hours if you experience unpleasant side effects which may include dry mouth, dizziness or visual disturbances. 3. Avoid touching the patch. Wash your hands with soap and water after contact with the patch.

## 2018-05-17 NOTE — Brief Op Note (Signed)
05/17/2018  2:56 PM  PATIENT:  Calvin [name]  57 y.o. male  PRE-OPERATIVE DIAGNOSIS:  osteomylitis  POST-OPERATIVE DIAGNOSIS:  osteomylitis  PROCEDURE:  Procedure(s) with comments: BONE BIOPSY X2 (Right) - right second toe  SURGEON:  Surgeon(s) and Role:    * Evelina Bucy, DPM - Primary  PHYSICIAN ASSISTANT:   ASSISTANTS: none   ANESTHESIA:   MAC  EBL:  2 ml  BLOOD ADMINISTERED:none  DRAINS: none   LOCAL MEDICATIONS USED:  MARCAINE    and Amount: 10 ml  SPECIMEN:   ID Type Source Tests Collected by Time Destination  1 : proximal phalanx  right second toe Tissue Bone SURGICAL PATHOLOGY Evelina Bucy, DPM 05/17/2018 1217   2 : middle phalanx right second toe Tissue Bone SURGICAL PATHOLOGY Evelina Bucy, DPM 05/17/2018 1434   A : proximal phalanx right second toe Tissue Bone GRAM STAIN, AEROBIC/ANAEROBIC CULTURE (SURGICAL/DEEP WOUND), FUNGUS STAIN Evelina Bucy, DPM 05/17/2018 1218   B : middle phalanx right second toe Tissue Bone GRAM STAIN, AEROBIC/ANAEROBIC CULTURE (SURGICAL/DEEP WOUND), FUNGUS STAIN Evelina Bucy, DPM 05/17/2018 1434       DISPOSITION OF SPECIMEN:  path and micro  COUNTS:  YES  TOURNIQUET:  * No tourniquets in log *  DICTATION: .Note written in EPIC  PLAN OF CARE: Discharge to home after PACU  PATIENT DISPOSITION:  PACU - hemodynamically stable.   Delay start of Pharmacological VTE agent (>24hrs) due to surgical blood loss or risk of bleeding: not applicable

## 2018-05-17 NOTE — Interval H&P Note (Signed)
History and Physical Interval Note:  05/17/2018 2:18 PM  Calvin [name]  has presented today for surgery, with the diagnosis of osteomylitis  The various methods of treatment have been discussed with the patient and family. After consideration of risks, benefits and other options for treatment, the patient has consented to  Procedure(s) with comments: BONE BIOPSY X2 (Right) - right second toe as a surgical intervention .  The patient's history has been reviewed, patient examined, no change in status, stable for surgery.  I have reviewed the patient's chart and labs.  Questions were answered to the patient's satisfaction.     Evelina Bucy

## 2018-05-17 NOTE — Anesthesia Procedure Notes (Signed)
Procedure Name: MAC Date/Time: 05/17/2018 2:33 PM Performed by: Lieutenant Diego, CRNA Pre-anesthesia Checklist: Patient identified, Emergency Drugs available, Suction available, Patient being monitored and Timeout performed Patient Re-evaluated:Patient Re-evaluated prior to induction Oxygen Delivery Method: Simple face mask Preoxygenation: Pre-oxygenation with 100% oxygen Induction Type: IV induction

## 2018-05-17 NOTE — Transfer of Care (Signed)
Immediate Anesthesia Transfer of Care Note  Patient: Calvin [name]  Procedure(s) Performed: BONE BIOPSY X2 (Right Toe)  Patient Location: PACU  Anesthesia Type:MAC  Level of Consciousness: awake  Airway & Oxygen Therapy: Patient Spontanous Breathing and Patient connected to face mask oxygen  Post-op Assessment: Report given to RN and Post -op Vital signs reviewed and stable  Post vital signs: Reviewed and stable  Last Vitals:  Vitals Value Taken Time  BP    Temp    Pulse    Resp    SpO2      Last Pain:  Vitals:   05/17/18 1019  TempSrc:   PainSc: 7       Patients Stated Pain Goal: 8 (91/79/15 0569)  Complications: No apparent anesthesia complications

## 2018-05-17 NOTE — H&P (Signed)
Anesthesia H&P Update: History and Physical Exam reviewed; patient is OK for planned anesthetic and procedure. ? ?

## 2018-05-18 ENCOUNTER — Encounter (HOSPITAL_BASED_OUTPATIENT_CLINIC_OR_DEPARTMENT_OTHER): Payer: Self-pay | Admitting: Podiatry

## 2018-05-18 ENCOUNTER — Telehealth: Payer: Self-pay | Admitting: Podiatry

## 2018-05-18 ENCOUNTER — Other Ambulatory Visit: Payer: Self-pay | Admitting: Podiatry

## 2018-05-18 ENCOUNTER — Other Ambulatory Visit: Payer: Self-pay | Admitting: Internal Medicine

## 2018-05-18 DIAGNOSIS — S91311A Laceration without foreign body, right foot, initial encounter: Secondary | ICD-10-CM

## 2018-05-18 DIAGNOSIS — M5416 Radiculopathy, lumbar region: Secondary | ICD-10-CM

## 2018-05-18 MED ORDER — CEPHALEXIN 500 MG PO CAPS: 500.0000 mg | ORAL_CAPSULE | Freq: Two times a day (BID) | ORAL | 0 refills | Status: DC

## 2018-05-18 MED ORDER — HYDROCODONE-ACETAMINOPHEN 10-325 MG PO TABS: 1.0000 | ORAL_TABLET | ORAL | 0 refills | Status: DC | PRN

## 2018-05-18 NOTE — Telephone Encounter (Signed)
Left message informing pt his medication has been sent to the [facility].

## 2018-05-18 NOTE — Telephone Encounter (Signed)
Please send pain meds to [facility] on [address]. His other pharmacy is unable to fill his medication. Please call patient. Had surgery 05/17/2018 with Dr. March Rummage

## 2018-05-18 NOTE — Op Note (Signed)
Patient Name: Calvin [name] DOB: April 19, 1961  MRN: [id number]  Date of Service: 05/17/18  Surgeon: Dr. Hardie Pulley, DPM Assistants: None Pre-operative Diagnosis: Concern for osteomyelitis Post-operative Diagnosis: same Procedures:             1) Open bone biopsy - proximal phalanx bone right second toe  2) Open bone biopsy - middle phalanx bone right second toe Pathology/Specimens: ID Type Source Tests Collected by Time Destination  1 : proximal phalanx  right second toe Tissue Bone SURGICAL PATHOLOGY Evelina Bucy, DPM 05/17/2018 1217   2 : middle phalanx right second toe Tissue Bone SURGICAL PATHOLOGY Evelina Bucy, DPM 05/17/2018 1434   A : proximal phalanx right second toe Tissue Bone GRAM STAIN, AEROBIC/ANAEROBIC CULTURE (SURGICAL/DEEP WOUND), FUNGUS STAIN Evelina Bucy, DPM 05/17/2018 1218   B : middle phalanx right second toe Tissue Bone GRAM STAIN, AEROBIC/ANAEROBIC CULTURE (SURGICAL/DEEP WOUND), FUNGUS STAIN Evelina Bucy, DPM 05/17/2018 1434     Anesthesia: MAC/local Hemostasis: Anatomic Estimated Blood Loss: 67m Materials: None Medications: None Complications: None  Indications for Procedure: This is a 57year old male with a history of hammertoe correction to the right second toe. He healed post-operatively but is not pleased with the result. He states the toe feels broken. Labs were taken and concerning for possible chronic infection. Bone biopsy indicated to eval for osteomyelitis. All risks, benefits, and alternatives explained to patient. No guarantees given.  Procedure in Detail: Patient was identified in pre-operative holding area. Formal consent was signed and the right lower extremity was marked. Patient was brought back to the operating room and remained on his stretcher in the supine position. Anesthesia was induced. The right lower extremity was prepped and draped in the usual sterile fashion. Timeout was taken to confirm patient name, laterality, and  procedure prior to incision. Attention was then directed to the left second toe.  An incision was made in the toe overlying the proximal phalanx. A bone biopsy was then taken of the proximal phalanx bone with a jamshidi needle. The sample was split with a bone cutter and sent for pathology and microbiology. The incision was then closed with 4-0 nylon.  The procedure was performed again on the middle phalanx bone with a clean Jamshidi needle and split with a clean rongeur.   The foot was then dressed with xeroform, 4x4, kerlix, and ACE bandage. Patient tolerated the procedure well.  Disposition: Following a period of post-operative monitoring, patient will be transferred back home.

## 2018-05-18 NOTE — Progress Notes (Signed)
Medicine sent to [facility]

## 2018-05-19 MED FILL — CYCLOBENZAPRINE 5 MG TABLET: 5 | 15 days supply | Qty: 30 | Fill #0

## 2018-05-20 LAB — FUNGUS STAIN

## 2018-05-20 LAB — FUNGAL STAIN REFLEX

## 2018-05-22 ENCOUNTER — Telehealth: Payer: Self-pay | Admitting: *Deleted

## 2018-05-22 DIAGNOSIS — M2012 Hallux valgus (acquired), left foot: Secondary | ICD-10-CM

## 2018-05-22 DIAGNOSIS — M2041 Other hammer toe(s) (acquired), right foot: Secondary | ICD-10-CM

## 2018-05-22 DIAGNOSIS — M86171 Other acute osteomyelitis, right ankle and foot: Secondary | ICD-10-CM

## 2018-05-22 DIAGNOSIS — M86671 Other chronic osteomyelitis, right ankle and foot: Secondary | ICD-10-CM

## 2018-05-22 LAB — AEROBIC/ANAEROBIC CULTURE (SURGICAL/DEEP WOUND)
CULTURE: NO GROWTH
GRAM STAIN: NONE SEEN

## 2018-05-22 LAB — AEROBIC/ANAEROBIC CULTURE W GRAM STAIN (SURGICAL/DEEP WOUND)
Culture: NO GROWTH
Gram Stain: NONE SEEN

## 2018-05-22 NOTE — Telephone Encounter (Signed)
Dr. March Rummage stated in Results section pt has "dirty bone culture", referred to Infectious Disease. Required form, clinicals and demographics faxed to Infectious Disease.

## 2018-05-22 NOTE — Anesthesia Postprocedure Evaluation (Signed)
Anesthesia Post Note  Patient: Duwane Vandyke  Procedure(s) Performed: BONE BIOPSY X2 (Right Toe)     Patient location during evaluation: PACU Anesthesia Type: MAC Level of consciousness: awake and alert Pain management: pain level controlled Vital Signs Assessment: post-procedure vital signs reviewed and stable Respiratory status: spontaneous breathing, nonlabored ventilation, respiratory function stable and patient connected to nasal cannula oxygen Cardiovascular status: stable and blood pressure returned to baseline Postop Assessment: no apparent nausea or vomiting Anesthetic complications: no    Last Vitals:  Vitals:   05/17/18 1528 05/17/18 1557  BP: 120/83 128/86  Pulse: 62 65  Resp: 10 16  Temp:  36.8 C  SpO2: 97% 98%    Last Pain:  Vitals:   05/17/18 1557  TempSrc:   PainSc: 0-No pain                 Montez Hageman

## 2018-05-22 NOTE — Addendum Note (Signed)
Addended by: Harriett Sine D on: 05/22/2018 09:19 AM   Modules accepted: Orders

## 2018-05-25 ENCOUNTER — Encounter: Payer: Self-pay | Admitting: Internal Medicine

## 2018-05-25 ENCOUNTER — Ambulatory Visit (INDEPENDENT_AMBULATORY_CARE_PROVIDER_SITE_OTHER): Payer: Self-pay | Admitting: Podiatry

## 2018-05-25 ENCOUNTER — Ambulatory Visit (INDEPENDENT_AMBULATORY_CARE_PROVIDER_SITE_OTHER): Payer: Medicaid Other

## 2018-05-25 ENCOUNTER — Ambulatory Visit (INDEPENDENT_AMBULATORY_CARE_PROVIDER_SITE_OTHER): Payer: Medicaid Other | Admitting: Internal Medicine

## 2018-05-25 VITALS — Temp 97.0°F

## 2018-05-25 DIAGNOSIS — S91311A Laceration without foreign body, right foot, initial encounter: Secondary | ICD-10-CM | POA: Diagnosis present

## 2018-05-25 DIAGNOSIS — M86671 Other chronic osteomyelitis, right ankle and foot: Secondary | ICD-10-CM

## 2018-05-25 DIAGNOSIS — M2041 Other hammer toe(s) (acquired), right foot: Secondary | ICD-10-CM

## 2018-05-25 MED ORDER — CEPHALEXIN 500 MG PO CAPS: 500.0000 mg | ORAL_CAPSULE | Freq: Two times a day (BID) | ORAL | 0 refills | Status: AC

## 2018-05-25 NOTE — Assessment & Plan Note (Signed)
His wound has healed and there is no evidence of osteomyelitis by recent bone biopsy.  He did have elevated inflammatory markers 6 weeks ago.  I will repeat his lab work today.  He will complete his last dose of cephalexin and follow-up here in 2 weeks.

## 2018-05-25 NOTE — Progress Notes (Signed)
[facility] for Infectious Disease  Reason for Consult: Possible osteomyelitis of right second toe Referring Provider: Dr. Hardie Pulley  Assessment: His wound has healed and there is no evidence of osteomyelitis by recent bone biopsy.  He did have elevated inflammatory markers 6 weeks ago.  I will repeat his lab work today.  He will complete his last dose of cephalexin and follow-up here in 2 weeks.   Plan: 1. Complete last dose of cephalexin today 2. Repeat sed rate and C-reactive protein 3. Follow-up in 2 weeks  Patient Active Problem List   Diagnosis Date Noted  . Foot laceration, right, initial encounter     Priority: High  . Radiculopathy 03/08/2018  . Tubular adenoma 05/16/2017  . Depressed mood 05/16/2017  . Substance abuse ([place]) 05/16/2017  . Hallux valgus 08/03/2016  . Disc disorder   . Lumbar facet arthropathy 08/06/2014  . Lumbar radiculopathy 08/06/2014  . ED (erectile dysfunction) 01/07/2014  . Unspecified vitamin D deficiency 01/07/2014  . Left shoulder pain 08/10/2013  . Chronic low back pain 08/10/2013  . Essential hypertension 08/10/2013  . Tobacco abuse 08/10/2013    Patient's Medications  New Prescriptions   No medications on file  Previous Medications   AMLODIPINE (NORVASC) 10 MG TABLET    Take 1 tablet (10 mg total) by mouth daily.   CYCLOBENZAPRINE (FLEXERIL) 5 MG TABLET    TAKE 1 TABLET (5 MG TOTAL) BY MOUTH 2 (TWO) TIMES DAILY AS NEEDED FOR MUSCLE SPASMS.   HYDROCHLOROTHIAZIDE (HYDRODIURIL) 25 MG TABLET    Take 1 tablet (25 mg total) by mouth daily.   HYDROCODONE-ACETAMINOPHEN (NORCO) 10-325 MG TABLET    Take 1 tablet by mouth every 4 (four) hours as needed.   MULTIPLE VITAMIN (MULTIVITAMIN) TABLET    Take 1 tablet by mouth daily.   SERTRALINE (ZOLOFT) 50 MG TABLET    Half a tablet p.o. daily x2 weeks.  Then 1 tab p.o. daily   SILDENAFIL (VIAGRA) 100 MG TABLET    1 tab PO 1/2 hr before intercourse PRN.  Limit use to 1 tab /24 hr  period.  Modified Medications   Modified Medication Previous Medication   CEPHALEXIN (KEFLEX) 500 MG CAPSULE cephALEXin (KEFLEX) 500 MG capsule      Take 1 capsule (500 mg total) by mouth 2 (two) times daily for 8 days.    Take 1 capsule (500 mg total) by mouth 2 (two) times daily.  Discontinued Medications   No medications on file    HPI: Calvin [name] is a 57 y.o. male with diabetes and degenerative joint disease who is undergone previous surgeries on his right foot and toes.  He had corrective fusion of his right second toe in 2003.  He was walking barefoot in his home on 01/20/2018 when he caught that toe in a rug and suffered a laceration at the base of his second toe.  He was seen in the emergency department where sutures were placed.  No fracture was noted on x-ray.  About 1 week later he bent his toe and popped several of the sutures.  He was seen back in the emergency department on 01/30/2018 and all sutures were removed.  He continued to have some serosanguineous drainage from the wound.  An x-ray was repeated on 03/02/2018.  Some mild periosteal reaction was noted and apparently was not entirely clear if that was due to recent fracture or osteomyelitis.  He was referred to Dr. March Rummage.  His wound finally healed but because of concern for infection he underwent bone biopsies on 03/17/2018.  No acute osteomyelitis was seen on pathologic exam.  Operative Gram stain was reported to show gram-negative rods but his cultures were negative.  He denies having been on any antibiotics prior to surgery.  He was started on cephalexin postoperatively and has 1 more dose.  He states that his toe is looking much better.  Review of Systems: Review of Systems  Constitutional: Negative for chills, diaphoresis and fever.  Gastrointestinal: Negative for abdominal pain, diarrhea, nausea and vomiting.  Musculoskeletal: Negative for joint pain.      Past Medical History:  Diagnosis Date  . Anxiety   . Arthritis     back, shoulders   . Constipation   . Depression   . Disc disorder   . Hypertension   . Lumbar herniated disc   . Muscle spasm     Social History   Tobacco Use  . Smoking status: Current Every Day Smoker    Packs/day: 0.10    Years: 20.00    Pack years: 2.00    Types: Cigarettes  . Smokeless tobacco: Never Used  . Tobacco comment: 5 cigs a day   Substance Use Topics  . Alcohol use: No  . Drug use: No    Family History  Problem Relation Age of Onset  . Diabetes Mother   . Hypertension Mother   . Heart disease Mother   . Hyperlipidemia Mother   . Diabetes Brother   . Diabetes Daughter   . Hypertension Sister   . Diabetes Sister   . Stomach cancer Maternal Grandfather   . Colon cancer Neg Hx   . Esophageal cancer Neg Hx   . Rectal cancer Neg Hx   . Colon polyps Neg Hx    No Known Allergies  OBJECTIVE: Vitals:   05/25/18 0854  BP: 130/85  Pulse: 93  Temp: 98.6 F (37 C)  Weight: 152 lb (68.9 kg)   Body mass index is 20.61 kg/m.   Physical Exam  Constitutional: He is oriented to person, place, and time.  He is pleasant and in good spirits.  Musculoskeletal:  His right second toe laceration has healed.  There are no open wounds that toe hangs lower than his other toes on the right foot.  There is diffuse hyperpigmentation but no cellulitis, warmth or pain on palpation.  He has healed incisions on his right great toe and fourth and fifth toes from previous surgeries.  Neurological: He is alert and oriented to person, place, and time.  Skin: No rash noted.  Psychiatric: He has a normal mood and affect.    Microbiology: Recent Results (from the past 240 hour(s))  Aerobic/Anaerobic Culture (surgical/deep wound)     Status: None   Collection Time: 05/17/18  2:34 PM  Result Value Ref Range Status   Specimen Description   Final    BONE PROXIMAL PHALANX RIGHT 2ND TOE Performed at [facility] [address], [place], [place] [address]     Special Requests   Final    NONE Performed at [facility], [place] [address], [place], [place] [address]    Gram Stain NO WBC SEEN RARE GRAM NEGATIVE RODS   Final   Culture   Final    No growth aerobically or anaerobically. Performed at [facility], [place] [address] [place],  [address]    Report Status 05/22/2018 FINAL  Final  Aerobic/Anaerobic  Culture (surgical/deep wound)     Status: None   Collection Time: 05/17/18  2:38 PM  Result Value Ref Range Status   Specimen Description   Final    BONE MIDDLE PHALANX RIGHT 2ND TOE Performed at [facility] [address], [place], [place] [address]    Special Requests   Final    NONE Performed at [facility], [place] [address], [place], [place] [address]    Gram Stain   Final    NO WBC SEEN FEW GRAM NEGATIVE RODS RARE GRAM VARIABLE ROD    Culture   Final    No growth aerobically or anaerobically. Performed at [facility], [place] [address] [place], [place] [address]    Report Status 05/22/2018 FINAL  Final  Fungus Stain     Status: None   Collection Time: 05/17/18  2:38 PM  Result Value Ref Range Status   FUNGUS STAIN Final report  Final    Comment: (NOTE) Performed At: [facility] [place], [place] [id number] Rush Farmer MD PY:[id number]    Fungal Source BONE  Final    Comment: PROXIMAL PHALANX RIGHT 2ND TOE Performed at [facility] [place], [place] [address], [place], [place] [address]   Fungus Stain     Status: None   Collection Time: 05/17/18  2:38 PM  Result Value Ref Range Status   FUNGUS STAIN Final report  Final    Comment: (NOTE) Performed At: [facility] [address] [place], [place] [id number] Rush Farmer MD [id number]    Fungal Source BONE  Final    Comment: MIDDLE PHALANX RIGHT 2ND TOE Performed at [facility] [place] [address], [place], [place] [address]     Fungal Stain reflex     Status: None   Collection Time: 05/17/18  2:38 PM  Result Value Ref Range Status   Fungal stain result 1 Comment  Final    Comment: (NOTE) KOH/Calcofluor preparation:  no fungus observed. Performed At: [facility] [place], [place] [id number] Rush Farmer MD [id number]   Fungal Stain reflex     Status: None   Collection Time: 05/17/18  2:38 PM  Result Value Ref Range Status   Fungal stain result 1 Comment  Final    Comment: (NOTE) KOH/Calcofluor preparation:  no fungus observed. Performed At: [facility] [address] [place], [place] [id number] Rush Farmer MD [id number]     Michel Bickers, [facility] [phone] pager   [phone] cell 05/25/2018, 9:40 AM

## 2018-05-26 LAB — C-REACTIVE PROTEIN: CRP: 4.4 mg/L (ref <8.0)

## 2018-05-26 LAB — SEDIMENTATION RATE: Sed Rate: 28 mm/h — ABNORMAL HIGH (ref 0–20)

## 2018-05-29 NOTE — Progress Notes (Signed)
Subjective: 57 year old male presents the office today status post bone biopsy on the right second toe performed on May 17, 2018 with Dr. March Rummage.  He also saw Dr. Megan Salon from [facility] today.  Overall he states he is doing well and is not having any pain.  He denies any fevers, chills, nausea, vomiting.  He is remained in the surgical shoe. Denies any systemic complaints such as fevers, chills, nausea, vomiting. No acute changes since last appointment, and no other complaints at this time.   Objective: AAO x3, NAD DP/PT pulses palpable bilaterally, CRT less than 3 seconds Minimal swelling to the level of right second toe.  There is 2 sutures intact from the biopsy sites which appear to be healing well.  There is no erythema or increase in warmth.  No tenderness palpation.  Hammertoe contractures present. No open lesions or pre-ulcerative lesions.  No pain with calf compression, swelling, warmth, erythema  Assessment: Status post bone biopsy right second toe, doing well  Plan: -All treatment options discussed with the patient including all alternatives, risks, complications.  -Incisions are healing well.  Antibiotic ointment and bandage was applied.  Continue antibiotics per infectious disease.  Continue with surgical shoe for now.  Continue elevation.  We will see him back in 1 week with Dr. March Rummage to remove the sutures.  Watch for signs or symptoms of infection in the ER should any occur. -Patient encouraged to call the office with any questions, concerns, change in symptoms.   Trula Slade DPM

## 2018-06-01 ENCOUNTER — Ambulatory Visit: Payer: Medicaid Other | Admitting: Podiatry

## 2018-06-02 ENCOUNTER — Ambulatory Visit: Payer: Medicaid Other | Admitting: Internal Medicine

## 2018-06-02 ENCOUNTER — Ambulatory Visit (INDEPENDENT_AMBULATORY_CARE_PROVIDER_SITE_OTHER): Payer: Medicaid Other | Admitting: Podiatry

## 2018-06-02 DIAGNOSIS — M2041 Other hammer toe(s) (acquired), right foot: Secondary | ICD-10-CM

## 2018-06-02 DIAGNOSIS — M2012 Hallux valgus (acquired), left foot: Secondary | ICD-10-CM

## 2018-06-02 DIAGNOSIS — Z9889 Other specified postprocedural states: Secondary | ICD-10-CM

## 2018-06-07 ENCOUNTER — Ambulatory Visit: Payer: Medicaid Other | Admitting: Internal Medicine

## 2018-06-19 NOTE — Progress Notes (Signed)
  Subjective:  Patient ID: Calvin [name], male    DOB: 08/06/1960,  MRN: [id number]  Chief Complaint  Patient presents with  . Routine Post Op     1wk dos 10.23.2019 Bone Biopsy 2nd Rt Toe x2    DOS: 05/17/18 Procedure: Bone Biopsy Rt 2nd Toe x2  57 y.o. male returns for post-op check. Doing well denies pain.  Review of Systems: Negative except as noted in the HPI. Denies N/V/F/Ch.  Past Medical History:  Diagnosis Date  . Anxiety   . Arthritis    back, shoulders   . Constipation   . Depression   . Disc disorder   . Hypertension   . Lumbar herniated disc   . Muscle spasm     Current Outpatient Medications:  .  amLODipine (NORVASC) 10 MG tablet, Take 1 tablet (10 mg total) by mouth daily., Disp: 90 tablet, Rfl: 3 .  cyclobenzaprine (FLEXERIL) 5 MG tablet, TAKE 1 TABLET (5 MG TOTAL) BY MOUTH 2 (TWO) TIMES DAILY AS NEEDED FOR MUSCLE SPASMS., Disp: 30 tablet, Rfl: 0 .  hydrochlorothiazide (HYDRODIURIL) 25 MG tablet, Take 1 tablet (25 mg total) by mouth daily., Disp: 90 tablet, Rfl: 3 .  HYDROcodone-acetaminophen (NORCO) 10-325 MG tablet, Take 1 tablet by mouth every 4 (four) hours as needed. (Patient not taking: Reported on 05/25/2018), Disp: 20 tablet, Rfl: 0 .  Multiple Vitamin (MULTIVITAMIN) tablet, Take 1 tablet by mouth daily., Disp: , Rfl:  .  sertraline (ZOLOFT) 50 MG tablet, Half a tablet p.o. daily x2 weeks.  Then 1 tab p.o. daily, Disp: 30 tablet, Rfl: 2 .  sildenafil (VIAGRA) 100 MG tablet, 1 tab PO 1/2 hr before intercourse PRN.  Limit use to 1 tab /24 hr period. (Patient not taking: Reported on 05/25/2018), Disp: 30 tablet, Rfl: 3  Social History   Tobacco Use  Smoking Status Current Every Day Smoker  . Packs/day: 0.10  . Years: 20.00  . Pack years: 2.00  . Types: Cigarettes  Smokeless Tobacco Never Used  Tobacco Comment   5 cigs a day     No Known Allergies Objective:  There were no vitals filed for this visit. There is no height or weight on file to  calculate BMI. Constitutional Well developed. Well nourished.  Vascular Foot warm and well perfused. Capillary refill normal to all digits.   Neurologic Normal speech. Oriented to person, place, and time. Epicritic sensation to light touch grossly present bilaterally.  Dermatologic Incisions healed.  Orthopedic: No tenderness to palpation noted about the surgical site.   Radiographs: None Assessment:   1. Hammer toe of right foot   2. Hallux valgus, left   3. Post-operative state    Plan:  Patient was evaluated and treated and all questions answered.  S/p foot surgery right -Progressing as expected post-operatively. -XR: none -WB Status: WBAT in normal shoe -Sutures: out. -Medications: none -Foot redressed. -Biopsy results reviewed, no evidence of OM though GS had bacterial cells. No long term abx per ID.  Return in about 3 weeks (around 06/23/2018) for Surgical planning visit.

## 2018-06-30 ENCOUNTER — Ambulatory Visit: Payer: Medicaid Other | Admitting: Podiatry

## 2018-06-30 DIAGNOSIS — M2041 Other hammer toe(s) (acquired), right foot: Secondary | ICD-10-CM

## 2018-06-30 DIAGNOSIS — M2012 Hallux valgus (acquired), left foot: Secondary | ICD-10-CM | POA: Diagnosis not present

## 2018-06-30 NOTE — Patient Instructions (Signed)
Pre-Operative Instructions  Congratulations, you have decided to take an important step towards improving your quality of life.  You can be assured that the doctors and staff at [facility] will be with you every step of the way.  Here are some important things you should know:  1. Plan to be at the surgery center/hospital at least 1 (one) hour prior to your scheduled time, unless otherwise directed by the surgical center/hospital staff.  You must have a responsible adult accompany you, remain during the surgery and drive you home.  Make sure you have directions to the surgical center/hospital to ensure you arrive on time. 2. If you are having surgery at [facility] or [facility], you will need a copy of your medical history and physical form from your family physician within one month prior to the date of surgery. We will give you a form for your primary physician to complete.  3. We make every effort to accommodate the date you request for surgery.  However, there are times where surgery dates or times have to be moved.  We will contact you as soon as possible if a change in schedule is required.   4. No aspirin/ibuprofen for one week before surgery.  If you are on aspirin, any non-steroidal anti-inflammatory medications (Mobic, Aleve, Ibuprofen) should not be taken seven (7) days prior to your surgery.  You make take Tylenol for pain prior to surgery.  5. Medications - If you are taking daily heart and blood pressure medications, seizure, reflux, allergy, asthma, anxiety, pain or diabetes medications, make sure you notify the surgery center/hospital before the day of surgery so they can tell you which medications you should take or avoid the day of surgery. 6. No food or drink after midnight the night before surgery unless directed otherwise by surgical center/hospital staff. 7. No alcoholic beverages 24-hours prior to surgery.  No smoking 24-hours prior or 24-hours after  surgery. 8. Wear loose pants or shorts. They should be loose enough to fit over bandages, boots, and casts. 9. Don't wear slip-on shoes. Sneakers are preferred. 10. Bring your boot with you to the surgery center/hospital.  Also bring crutches or a walker if your physician has prescribed it for you.  If you do not have this equipment, it will be provided for you after surgery. 11. If you have not been contacted by the surgery center/hospital by the day before your surgery, call to confirm the date and time of your surgery. 12. Leave-time from work may vary depending on the type of surgery you have.  Appropriate arrangements should be made prior to surgery with your employer. 13. Prescriptions will be provided immediately following surgery by your doctor.  Fill these as soon as possible after surgery and take the medication as directed. Pain medications will not be refilled on weekends and must be approved by the doctor. 14. Remove nail polish on the operative foot and avoid getting pedicures prior to surgery. 15. Wash the night before surgery.  The night before surgery wash the foot and leg well with water and the antibacterial soap provided. Be sure to pay special attention to beneath the toenails and in between the toes.  Wash for at least three (3) minutes. Rinse thoroughly with water and dry well with a towel.  Perform this wash unless told not to do so by your physician.  Enclosed: 1 Ice pack (please put in freezer the night before surgery)   1 Hibiclens skin cleaner     Pre-op instructions  If you have any questions regarding the instructions, please do not hesitate to call our office.  [place]: [address], [place], [place] [address] -- [phone]  [place]: [address] [place], [place] [address] -- [phone]  [place]: [address]  [place], [place] [address] -- [phone]  [place]: [address], [address], [place], [place] [address] -- [phone]  Website:  [email or web address] 

## 2018-07-05 ENCOUNTER — Other Ambulatory Visit: Payer: Self-pay | Admitting: Internal Medicine

## 2018-07-05 DIAGNOSIS — M5416 Radiculopathy, lumbar region: Secondary | ICD-10-CM

## 2018-07-10 MED FILL — HYDROCHLOROTHIAZIDE 25 MG T: 25 | 30 days supply | Qty: 30 | Fill #3

## 2018-07-10 MED FILL — SERTRALINE HCL 50 MG TABS: 50 | 30 days supply | Qty: 30 | Fill #1

## 2018-07-10 MED FILL — AMLODIPINE BESYLATE 10 MG T: 10 | 30 days supply | Qty: 30 | Fill #3

## 2018-07-10 MED FILL — CYCLOBENZAPRINE 5 MG TABLET: 5 | 15 days supply | Qty: 30 | Fill #0

## 2018-08-15 ENCOUNTER — Encounter: Payer: Self-pay | Admitting: Internal Medicine

## 2018-08-15 ENCOUNTER — Ambulatory Visit: Payer: Medicaid Other | Attending: Internal Medicine | Admitting: Internal Medicine

## 2018-08-15 VITALS — BP 124/82 | HR 68 | Temp 99.0°F | Resp 16 | Ht 73.0 in | Wt 158.6 lb

## 2018-08-15 DIAGNOSIS — Z87891 Personal history of nicotine dependence: Secondary | ICD-10-CM

## 2018-08-15 DIAGNOSIS — E559 Vitamin D deficiency, unspecified: Secondary | ICD-10-CM | POA: Insufficient documentation

## 2018-08-15 DIAGNOSIS — Z79899 Other long term (current) drug therapy: Secondary | ICD-10-CM | POA: Diagnosis not present

## 2018-08-15 DIAGNOSIS — Z8249 Family history of ischemic heart disease and other diseases of the circulatory system: Secondary | ICD-10-CM | POA: Insufficient documentation

## 2018-08-15 DIAGNOSIS — F17210 Nicotine dependence, cigarettes, uncomplicated: Secondary | ICD-10-CM | POA: Insufficient documentation

## 2018-08-15 DIAGNOSIS — F329 Major depressive disorder, single episode, unspecified: Secondary | ICD-10-CM | POA: Insufficient documentation

## 2018-08-15 DIAGNOSIS — I10 Essential (primary) hypertension: Secondary | ICD-10-CM | POA: Diagnosis present

## 2018-08-15 DIAGNOSIS — R4589 Other symptoms and signs involving emotional state: Secondary | ICD-10-CM

## 2018-08-15 DIAGNOSIS — M5416 Radiculopathy, lumbar region: Secondary | ICD-10-CM | POA: Diagnosis not present

## 2018-08-15 DIAGNOSIS — N529 Male erectile dysfunction, unspecified: Secondary | ICD-10-CM | POA: Diagnosis not present

## 2018-08-15 DIAGNOSIS — G959 Disease of spinal cord, unspecified: Secondary | ICD-10-CM | POA: Insufficient documentation

## 2018-08-15 DIAGNOSIS — R252 Cramp and spasm: Secondary | ICD-10-CM | POA: Diagnosis not present

## 2018-08-15 MED ORDER — CYCLOBENZAPRINE HCL 5 MG PO TABS: 5.0000 mg | ORAL_TABLET | Freq: Two times a day (BID) | ORAL | 1 refills | Status: DC | PRN

## 2018-08-15 MED FILL — CYCLOBENZAPRINE 5 MG TABLET: 5 | 15 days supply | Qty: 30 | Fill #0

## 2018-08-15 MED FILL — HYDROCHLOROTHIAZIDE 25 MG T: 25 | 30 days supply | Qty: 30 | Fill #4

## 2018-08-15 MED FILL — AMLODIPINE BESYLATE 10 MG T: 10 | 30 days supply | Qty: 30 | Fill #4

## 2018-08-15 MED FILL — SERTRALINE HCL 50 MG TABS: 50 | 30 days supply | Qty: 30 | Fill #2

## 2018-08-15 NOTE — Progress Notes (Signed)
Patient ID: Calvin [name], male    DOB: 03-20-1961  MRN: [id number]  CC: Hypertension   Subjective: Calvin [name] is a 58 y.o. male who presents for chronic disease management. His concerns today include:  Pt with hx of HTN, chronic LBP, ED, tobacco, cocaine use, depression  Since last visit patient had bone biopsy of the right second toe.  This was negative for osteomyelitis.  Patient states that the plan now is for fusion surgery on this toe that is scheduled for   08/27/2018  HTN:  Compliant with medications and salt restriction.  He denies any chest pains or shortness of breath.  No lower extremity edema.  Patient requests RF on Flexeril.  It was given initially to help with pain in his lower back.  However he states that he also gets cramps legs,   Tob dep:  Quit 1.5 wks ago.he reports that he has the nicotine patches and gum which he plans to use only if needed.   Depression:  Doing okoy on zolof Patient Active Problem List   Diagnosis Date Noted  . Cervical myelopathy ([place]) 08/15/2018  . Foot laceration, right, initial encounter   . Radiculopathy 03/08/2018  . Tubular adenoma 05/16/2017  . Depressed mood 05/16/2017  . Substance abuse ([place]) 05/16/2017  . Hallux valgus 08/03/2016  . Disc disorder   . Lumbar facet arthropathy 08/06/2014  . Lumbar radiculopathy 08/06/2014  . ED (erectile dysfunction) 01/07/2014  . Unspecified vitamin D deficiency 01/07/2014  . Left shoulder pain 08/10/2013  . Chronic low back pain 08/10/2013  . Essential hypertension 08/10/2013  . Tobacco abuse 08/10/2013     Current Outpatient Medications on File Prior to Visit  Medication Sig Dispense Refill  . amLODipine (NORVASC) 10 MG tablet Take 1 tablet (10 mg total) by mouth daily. 90 tablet 3  . hydrochlorothiazide (HYDRODIURIL) 25 MG tablet Take 1 tablet (25 mg total) by mouth daily. 90 tablet 3  . Multiple Vitamin (MULTIVITAMIN) tablet Take 1 tablet by mouth daily.    . sertraline (ZOLOFT) 50 MG  tablet Half a tablet p.o. daily x2 weeks.  Then 1 tab p.o. daily (Patient taking differently: Take 50 mg by mouth daily. Half a tablet p.o. daily x2 weeks.  Then 1 tab p.o. daily) 30 tablet 2  . sildenafil (VIAGRA) 100 MG tablet 1 tab PO 1/2 hr before intercourse PRN.  Limit use to 1 tab /24 hr period. (Patient not taking: Reported on 05/25/2018) 30 tablet 3   No current facility-administered medications on file prior to visit.     No Known Allergies  Social History   Socioeconomic History  . Marital status: Single    Spouse name: Not on file  . Number of children: Not on file  . Years of education: Not on file  . Highest education level: Not on file  Occupational History  . Not on file  Social Needs  . Financial resource strain: Not on file  . Food insecurity:    Worry: Not on file    Inability: Not on file  . Transportation needs:    Medical: Not on file    Non-medical: Not on file  Tobacco Use  . Smoking status: Current Every Day Smoker    Packs/day: 0.10    Years: 20.00    Pack years: 2.00    Types: Cigarettes  . Smokeless tobacco: Never Used  . Tobacco comment: 5 cigs a day   Substance and Sexual Activity  . Alcohol use: No  .  Drug use: No  . Sexual activity: Not Currently  Lifestyle  . Physical activity:    Days per week: Not on file    Minutes per session: Not on file  . Stress: Not on file  Relationships  . Social connections:    Talks on phone: Not on file    Gets together: Not on file    Attends religious service: Not on file    Active member of club or organization: Not on file    Attends meetings of clubs or organizations: Not on file    Relationship status: Not on file  . Intimate partner violence:    Fear of current or ex partner: Not on file    Emotionally abused: Not on file    Physically abused: Not on file    Forced sexual activity: Not on file  Other Topics Concern  . Not on file  Social History Narrative  . Not on file    Family  History  Problem Relation Age of Onset  . Diabetes Mother   . Hypertension Mother   . Heart disease Mother   . Hyperlipidemia Mother   . Diabetes Brother   . Diabetes Daughter   . Hypertension Sister   . Diabetes Sister   . Stomach cancer Maternal Grandfather   . Colon cancer Neg Hx   . Esophageal cancer Neg Hx   . Rectal cancer Neg Hx   . Colon polyps Neg Hx     Past Surgical History:  Procedure Laterality Date  . ANTERIOR CERVICAL DECOMP/DISCECTOMY FUSION N/A 03/08/2018   Procedure: ANTERIOR CERVICAL DECOMPRESSION/DISCECTOMY FUSION , cervical 3-4, cervical 4-5, cervical 5-6 with intrumentational and allograft;  Surgeon: Phylliss Bob, MD;  Location: [facility];  Service: Orthopedics;  Laterality: N/A;  . BONE BIOPSY Right 05/17/2018   Procedure: BONE BIOPSY X2;  Surgeon: Evelina Bucy, DPM;  Location: [facility];  Service: Podiatry;  Laterality: Right;  right second toe  . COLONOSCOPY    . FOOT SURGERY     right, ~ 2002  . POLYPECTOMY      ROS: Review of Systems Negative except as above  PHYSICAL EXAM: BP 124/82   Pulse 68   Temp 99 F (37.2 C) (Oral)   Resp 16   Ht 6\' 1"  (1.854 m)   Wt 158 lb 9.6 oz (71.9 kg)   SpO2 96%   BMI 20.92 kg/m   Physical Exam  General appearance - alert, well appearing, and in no distress Mental status - normal mood, behavior, speech, dress, motor activity, and thought processes Eyes - pupils equal and reactive, extraocular eye movements intact Mouth - mucous membranes moist, pharynx normal without lesions Neck - supple, no significant adenopathy Chest - clear to auscultation, no wheezes, rales or rhonchi, symmetric air entry Heart - normal rate, regular rhythm, normal S1, S2, no murmurs, rubs, clicks or gallops Extremities - peripheral pulses normal, no pedal edema, no clubbing or cyanosis MSK -patient ambulates with a cane  ASSESSMENT AND PLAN:  1. Essential hypertension Close to goal of 130/80 or lower.   Continue amlodipine and HCTZ.  Continue to limit salt in the foods.  2. Former smoker Commended him on quitting.  Encouraged him to remain tobacco free.  He does have nicotine replacement therapy which he can use as needed  3. Depressed mood Doing well on Zoloft.  4. Cramp of both lower extremities - cyclobenzaprine (FLEXERIL) 5 MG tablet; Take 1 tablet (5 mg total) by mouth 2 (  two) times daily as needed for muscle spasms.  Dispense: 30 tablet; Refill: 1  5. Lumbar radiculopathy - cyclobenzaprine (FLEXERIL) 5 MG tablet; Take 1 tablet (5 mg total) by mouth 2 (two) times daily as needed for muscle spasms.  Dispense: 30 tablet; Refill: 1  6.  Cervical myelopathy Patient reports that the strength in his legs are improving post anterior cervical decompression and fusion done in August of last year.    Patient was given the opportunity to ask questions.  Patient verbalized understanding of the plan and was able to repeat key elements of the plan.   No orders of the defined types were placed in this encounter.    Requested Prescriptions   Signed Prescriptions Disp Refills  . cyclobenzaprine (FLEXERIL) 5 MG tablet 30 tablet 1    Sig: Take 1 tablet (5 mg total) by mouth 2 (two) times daily as needed for muscle spasms.    Return in about 3 months (around 11/14/2018).  Karle Plumber, MD, FACP

## 2018-08-18 ENCOUNTER — Ambulatory Visit: Payer: Medicaid Other | Admitting: Podiatry

## 2018-08-20 NOTE — Progress Notes (Signed)
  Subjective:  Patient ID: Calvin [name], male    DOB: 05/11/1961,  MRN: [id number]  Chief Complaint  Patient presents with  . Consult    surgery planning    DOS: 05/17/18 Procedure: Bone Biopsy Rt 2nd Toe x2  58 y.o. male returns for post-op check.  Biopsy site is doing fine no complaints or concerns.  Review of Systems: Negative except as noted in the HPI. Denies N/V/F/Ch.  Past Medical History:  Diagnosis Date  . Anxiety   . Arthritis    back, shoulders   . Constipation   . Depression   . Disc disorder   . Hypertension   . Lumbar herniated disc   . Muscle spasm     Current Outpatient Medications:  .  amLODipine (NORVASC) 10 MG tablet, Take 1 tablet (10 mg total) by mouth daily., Disp: 90 tablet, Rfl: 3 .  cyclobenzaprine (FLEXERIL) 5 MG tablet, Take 1 tablet (5 mg total) by mouth 2 (two) times daily as needed for muscle spasms., Disp: 30 tablet, Rfl: 1 .  hydrochlorothiazide (HYDRODIURIL) 25 MG tablet, Take 1 tablet (25 mg total) by mouth daily., Disp: 90 tablet, Rfl: 3 .  Multiple Vitamin (MULTIVITAMIN) tablet, Take 1 tablet by mouth daily., Disp: , Rfl:  .  sertraline (ZOLOFT) 50 MG tablet, Half a tablet p.o. daily x2 weeks.  Then 1 tab p.o. daily (Patient taking differently: Take 50 mg by mouth daily. Half a tablet p.o. daily x2 weeks.  Then 1 tab p.o. daily), Disp: 30 tablet, Rfl: 2 .  sildenafil (VIAGRA) 100 MG tablet, 1 tab PO 1/2 hr before intercourse PRN.  Limit use to 1 tab /24 hr period. (Patient not taking: Reported on 05/25/2018), Disp: 30 tablet, Rfl: 3  Social History   Tobacco Use  Smoking Status Current Every Day Smoker  . Packs/day: 0.10  . Years: 20.00  . Pack years: 2.00  . Types: Cigarettes  Smokeless Tobacco Never Used  Tobacco Comment   5 cigs a day     No Known Allergies Objective:  There were no vitals filed for this visit. There is no height or weight on file to calculate BMI. Constitutional Well developed. Well nourished.  Vascular Foot  warm and well perfused. Capillary refill normal to all digits.   Neurologic Normal speech. Oriented to person, place, and time. Epicritic sensation to light touch grossly present bilaterally.  Dermatologic Incisions healed.  Orthopedic: No tenderness to palpation noted about the surgical site.   Radiographs: None Assessment:   1. Hammer toe of right foot   2. Hallux valgus, left    Plan:  Patient was evaluated and treated and all questions answered.  S/p bone biopsy right -Well-healed -Biopsy results reviewed with patient both samples without evidence of osteomyelitis  HAV, hammertoe -Discussed surgical plan with patient for revision. -Patient has failed all conservative therapy and wishes to proceed with surgical intervention. All risks, benefits, and alternatives discussed with patient. No guarantees given. Consent reviewed and signed by patient. -Planned procedures: Right great toe metatarsal phalangeal fusion, right second metatarsal shortening osteotomy, right second hammertoe revision with pin or screw fixation, removal of hardware, bone marrow aspirate calcaneus    Return for post op.

## 2018-08-22 ENCOUNTER — Telehealth: Payer: Self-pay | Admitting: *Deleted

## 2018-08-22 NOTE — Telephone Encounter (Signed)
"  This is Fritz Pickerel, I'm calling you back.  You said Dr. March Rummage needs Augment at [facility] on February 5?"  He needs it at [facility]  "Okay great, I will give them a call.  It's a simple process in getting it but it has to be refrigerated.  Do you have a number or a person I should contact?"  You can call [phone].  I do not have a contact person.  "Thank you so much for contacting me.  I get everything arranged."

## 2018-08-22 NOTE — Telephone Encounter (Signed)
I called and left a message for Calvin [name] regarding the Augment.  I gave him all the pertinent information regarding the surgery.

## 2018-08-22 NOTE — Telephone Encounter (Addendum)
"  We need Dr. March Rummage to put in orders for [facility].  He's scheduled to come in for pre-admit testing.  He's scheduled for surgery on 08/30/18."  I'll let him know.  (Right great toe metatarsophangeal fusion, right 2nd metatarsal shortening osteotomy right, hammer toe revision with pin or screw fixation, removal of hardware, bone marrow aspirate calcaneal)   I called Alex in regards to [employer], [employer] (?).  I also called and left a message for Carrie Mew and Rudene Re of [employer] and [employer] regarding the Augment (?).  (I couldn't read the words clearly.)

## 2018-08-22 NOTE — Telephone Encounter (Signed)
"  This is Carrie Mew returning your call.  We do not carry Augment, that is a [employer].  It's like a bone putty I believe.  The representative you can contact is Wendall Stade and his phone number is [phone]."  Thank you so much, I'll give him a call.

## 2018-08-22 NOTE — Telephone Encounter (Signed)
Orders placed. Thanks.

## 2018-08-23 NOTE — Pre-Procedure Instructions (Signed)
Calvin [name]  08/23/2018      [facility] [place], [place] [address] Phone: [phone] Fax: [phone]    Your procedure is scheduled on Wednesday, Feb. 5th   Report to [facility] Admitting at 1:00 pm             (posted surgery time 3p - 4:30p)   Call this number if you have problems the morning of surgery:  [phone]   Remember:   Do not eat any foods or drink any liquids after midnight, Tuesday.               5 days prior to surgery, STOP TAKING ANY Vitamins, Herbal Supplements, Anti-inflammatories, Blood Thinners.    Take these medicines the morning of surgery with A SIP OF WATER : Amlodipine, Cyclobenzaprine    Do not wear jewelry - NO RINGS or bracelets  Do not wear lotions, colognes or deodorant.   Men may shave face and neck.  Do not bring valuables to the hospital.  [facility] is not responsible for any belongings or valuables.  Contacts, dentures or bridgework may not be worn into surgery.  Leave your suitcase in the car.  After surgery it may be brought to your room.  For patients admitted to the hospital, discharge time will be determined by your treatment team.  Patients discharged the day of surgery will not be allowed to drive home, AND will need someone to stay with you for the first 24 hrs.  Please read over the following fact sheets that you were given. Pain Booklet and Surgical Site Infection Prevention      [facility]- Preparing For Surgery  Before surgery, you can play an important role. Because skin is not sterile, your skin needs to be as free of germs as possible. You can reduce the number of germs on your skin by washing with CHG (chlorahexidine gluconate) Soap before surgery.  CHG is an antiseptic cleaner which kills germs and bonds with the skin to continue killing germs even after washing.    Oral Hygiene is  also important to reduce your risk of infection.    Remember - BRUSH YOUR TEETH THE MORNING OF SURGERY WITH YOUR REGULAR TOOTHPASTE  Please do not use if you have an allergy to CHG or antibacterial soaps. If your skin becomes reddened/irritated stop using the CHG.  Do not shave (including legs and underarms) for at least 48 hours prior to first CHG shower. It is OK to shave your face.  Please follow these instructions carefully.   1. Shower the NIGHT BEFORE SURGERY and the MORNING OF SURGERY with CHG.   2. If you chose to wash your hair, wash your hair first as usual with your normal shampoo.  3. After you shampoo, rinse your hair and body thoroughly to remove the shampoo.  4. Use CHG as you would any other liquid soap. You can apply CHG directly to the skin and wash gently with a scrungie or a clean washcloth.   5. Apply the CHG Soap to your body ONLY FROM THE NECK DOWN.  Do not use on open wounds or open sores. Avoid contact with your eyes, ears, mouth and genitals (private parts). Wash Face and genitals (private parts)  with your normal soap.  6. Wash thoroughly, paying special attention to the area where your surgery will be  performed.  7. Thoroughly rinse your body with warm water from the neck down.  8. DO NOT shower/wash with your normal soap after using and rinsing off the CHG Soap.  9. Pat yourself dry with a CLEAN TOWEL.  10. Wear CLEAN PAJAMAS to bed the night before surgery, wear comfortable clothes the morning of surgery  11. Place CLEAN SHEETS on your bed the night of your first shower and DO NOT SLEEP WITH PETS.  Day of Surgery:  Do not apply any deodorants/lotions.  Please wear clean clothes to the hospital/surgery center.    Remember to brush your teeth WITH YOUR REGULAR TOOTHPASTE.

## 2018-08-24 ENCOUNTER — Inpatient Hospital Stay (HOSPITAL_COMMUNITY)
Admission: RE | Admit: 2018-08-24 | Discharge: 2018-08-24 | Disposition: A | Discharge status: Home / Self Care | Payer: Medicaid Other | Source: Ambulatory Visit

## 2018-08-24 ENCOUNTER — Ambulatory Visit: Payer: Medicaid Other | Admitting: Podiatry

## 2018-08-25 ENCOUNTER — Telehealth: Payer: Self-pay | Admitting: *Deleted

## 2018-08-25 NOTE — Telephone Encounter (Signed)
I attempted to call the patient.  I left him a message to call me back.  He's scheduled for surgery on February 5.  He did not show up for his appointment with Dr. March Rummage yesterday nor did he go to his Pre-admission testing appointment.  I called and spoke to his sister, Baron Hamper.  She said she would try to call him and get him to call me back.  She said she didn't know what to tell me regarding him and his appointment.  She said he told her he was going to a doctor's appointment yesterday.

## 2018-08-28 NOTE — Telephone Encounter (Signed)
"  I just spoke to [name].  I reminded him of his surgery for Wednesday.  He said he had spoken to you all about rescheduling his surgery.  Is that so?"  I have not spoken to Mr. Burrows.  "He said he talked to someone at the front desk.  I asked if he had told you or Dr. March Rummage.  He said he had not."  You may want to call him because he's answering his phone right now."  I'll give him a call.   I am calling your regarding your surgery that's scheduled for Wednesday.  I got a call from a nurse at [facility].  She said she had spoken with you and that you told her you want to reschedule your surgery.  "Yes, I want to hold off another month and see how I'm doing.  I recently had a cervical fusion and I have not recovered completely from that.  I have not gotten all my strength back yet, my legs are still weak.  Hopefully I'll feel better by then."  I will cancel your surgery.  Give [name] a call if you need any conservative treatment.  "Okay, I will."  I called Kend[place], [facility], and canceled the surgery that's scheduled for August 30, 2018.

## 2018-08-28 NOTE — Progress Notes (Signed)
Called pt for pre-op call. He stated that the surgery was to be cancelled because he's been having problems with his neck. I asked him if he had called Dr. Eleanora Neighbor office and he said "they know about it". I asked him who did he speak with and he said "the people at the front desk". He then told me he would call me in the morning and let me know what he decides. I told him I would be calling Dr. Eleanora Neighbor office today. I called and spoke with Delydia at Dr. Eleanora Neighbor office and she was unaware that pt was cancelling. She said she would contact pt.

## 2018-08-29 NOTE — Telephone Encounter (Signed)
Postop appointments were canceled.

## 2018-08-30 ENCOUNTER — Ambulatory Visit (HOSPITAL_COMMUNITY): Admission: RE | Admit: 2018-08-30 | Payer: Medicaid Other | Source: Home / Self Care | Admitting: Podiatry

## 2018-08-30 ENCOUNTER — Encounter (HOSPITAL_COMMUNITY): Admission: RE | Payer: Self-pay | Source: Home / Self Care

## 2018-08-30 SURGERY — OSTEOTOMY, METATARSAL BONE
Anesthesia: Monitor Anesthesia Care | Laterality: Right

## 2018-09-08 ENCOUNTER — Other Ambulatory Visit: Payer: Medicaid Other

## 2018-09-22 ENCOUNTER — Ambulatory Visit (INDEPENDENT_AMBULATORY_CARE_PROVIDER_SITE_OTHER): Payer: Medicaid Other | Admitting: Podiatry

## 2018-09-22 DIAGNOSIS — Z5329 Procedure and treatment not carried out because of patient's decision for other reasons: Secondary | ICD-10-CM

## 2018-09-23 NOTE — Progress Notes (Signed)
   Complete physical exam  Patient: Calvin [name]   DOB: 05/15/1999   58 y.o. Male  MRN: [id number]  Subjective:    No chief complaint on file.   Calvin [name] is a 58 y.o. male who presents today for a complete physical exam. She reports consuming a {diet types:17450} diet. {types:19826} She generally feels {DESC; WELL/FAIRLY WELL/POORLY:18703}. She reports sleeping {DESC; WELL/FAIRLY WELL/POORLY:18703}. She {does/does not:200015} have additional problems to discuss today.    Most recent fall risk assessment:    01/20/2022   10:42 AM  Fall Risk   Falls in the past year? 0  Number falls in past yr: 0  Injury with Fall? 0  Risk for fall due to : No Fall Risks  Follow up Falls evaluation completed     Most recent depression screenings:    01/20/2022   10:42 AM 12/11/2020   10:46 AM  PHQ 2/9 Scores  PHQ - 2 Score 0 0  PHQ- 9 Score 5     {VISON DENTAL STD PSA (Optional):27386}  {History (Optional):23778}  Patient Care Team: Jessup, Joy, NP as PCP - General (Nurse Practitioner)   Outpatient Medications Prior to Visit  Medication Sig   fluticasone (FLONASE) 50 MCG/ACT nasal spray Place 2 sprays into both nostrils in the morning and at bedtime. After 7 days, reduce to once daily.   norgestimate-ethinyl estradiol (SPRINTEC 28) 0.25-35 MG-MCG tablet Take 1 tablet by mouth daily.   Nystatin POWD Apply liberally to affected area 2 times per day   spironolactone (ALDACTONE) 100 MG tablet Take 1 tablet (100 mg total) by mouth daily.   No facility-administered medications prior to visit.    ROS        Objective:     There were no vitals taken for this visit. {Vitals History (Optional):23777}  Physical Exam   No results found for any visits on 02/25/22. {Show previous labs (optional):23779}    Assessment & Plan:    Routine Health Maintenance and Physical Exam  Immunization History  Administered Date(s) Administered   DTaP 07/29/1999, 09/24/1999,  12/03/1999, 08/18/2000, 03/03/2004   Hepatitis A 12/29/2007, 01/03/2009   Hepatitis B 05/16/1999, 06/23/1999, 12/03/1999   HiB (PRP-OMP) 07/29/1999, 09/24/1999, 12/03/1999, 08/18/2000   IPV 07/29/1999, 09/24/1999, 05/23/2000, 03/03/2004   Influenza,inj,Quad PF,6+ Mos 04/05/2014   Influenza-Unspecified 07/05/2012   MMR 05/23/2001, 03/03/2004   Meningococcal Polysaccharide 01/03/2012   Pneumococcal Conjugate-13 08/18/2000   Pneumococcal-Unspecified 12/03/1999, 02/16/2000   Tdap 01/03/2012   Varicella 05/23/2000, 12/29/2007    Health Maintenance  Topic Date Due   HIV Screening  Never done   Hepatitis C Screening  Never done   INFLUENZA VACCINE  02/23/2022   PAP-Cervical Cytology Screening  02/25/2022 (Originally 05/14/2020)   PAP SMEAR-Modifier  02/25/2022 (Originally 05/14/2020)   TETANUS/TDAP  02/25/2022 (Originally 01/02/2022)   HPV VACCINES  Discontinued   COVID-19 Vaccine  Discontinued    Discussed health benefits of physical activity, and encouraged her to engage in regular exercise appropriate for her age and condition.  Problem List Items Addressed This Visit   None Visit Diagnoses     Annual physical exam    -  Primary   Cervical cancer screening       Need for Tdap vaccination          No follow-ups on file.     Joy Jessup, NP   

## 2018-09-28 ENCOUNTER — Other Ambulatory Visit: Payer: Self-pay | Admitting: Internal Medicine

## 2018-09-28 DIAGNOSIS — F329 Major depressive disorder, single episode, unspecified: Principal | ICD-10-CM

## 2018-09-28 DIAGNOSIS — R4589 Other symptoms and signs involving emotional state: Secondary | ICD-10-CM

## 2018-09-28 MED FILL — HYDROCHLOROTHIAZIDE 25 MG T: 25 | 30 days supply | Qty: 30 | Fill #5

## 2018-09-28 MED FILL — AMLODIPINE BESYLATE 10 MG T: 10 | 30 days supply | Qty: 30 | Fill #5

## 2018-09-28 MED FILL — CYCLOBENZAPRINE 5 MG TABLET: 5 | 15 days supply | Qty: 30 | Fill #1

## 2018-09-29 MED FILL — SERTRALINE HCL 50 MG TABS: 50 | 30 days supply | Qty: 30 | Fill #0

## 2018-10-26 ENCOUNTER — Ambulatory Visit: Payer: Medicaid Other | Admitting: Podiatry

## 2018-11-02 ENCOUNTER — Ambulatory Visit: Payer: Medicare Other | Admitting: Podiatry

## 2018-11-02 ENCOUNTER — Telehealth: Payer: Self-pay | Admitting: Podiatry

## 2018-11-02 NOTE — Telephone Encounter (Signed)
Patient would like to have a refill of pain medication, decided to hold off on surgery until after the Coronovirus has calmed down. Patient uses [facility] on [address]

## 2018-11-02 NOTE — Telephone Encounter (Signed)
I informed pt Dr. March Rummage recommended he take the OTC pain medication he would take for a headache. Pt states he is taking BC powders but is starting to hurt his stomach. I told pt to stop the [facility] powders and take regular strength Tylenol as the package directs.

## 2018-11-02 NOTE — Telephone Encounter (Signed)
Thank you agree exactly.

## 2018-11-07 ENCOUNTER — Other Ambulatory Visit: Payer: Self-pay | Admitting: Internal Medicine

## 2018-11-07 DIAGNOSIS — M5416 Radiculopathy, lumbar region: Secondary | ICD-10-CM

## 2018-11-07 DIAGNOSIS — R252 Cramp and spasm: Secondary | ICD-10-CM

## 2018-11-07 MED FILL — SERTRALINE HCL 50 MG TABS: 50 | 30 days supply | Qty: 30 | Fill #1

## 2018-11-07 MED FILL — HYDROCHLOROTHIAZIDE 25 MG T: 25 | 90 days supply | Qty: 90 | Fill #6

## 2018-11-07 MED FILL — CYCLOBENZAPRINE 5 MG TABLET: 5 | 30 days supply | Qty: 60 | Fill #0

## 2018-11-07 MED FILL — AMLODIPINE BESYLATE 10 MG T: 10 | 90 days supply | Qty: 90 | Fill #6

## 2018-12-12 ENCOUNTER — Other Ambulatory Visit: Payer: Self-pay | Admitting: Internal Medicine

## 2018-12-12 DIAGNOSIS — R252 Cramp and spasm: Secondary | ICD-10-CM

## 2018-12-12 DIAGNOSIS — M5416 Radiculopathy, lumbar region: Secondary | ICD-10-CM

## 2018-12-12 MED FILL — SERTRALINE HCL 50 MG TABS: 50 | 30 days supply | Qty: 30 | Fill #2

## 2018-12-12 MED FILL — CYCLOBENZAPRINE 5 MG TABLET: 5 | 15 days supply | Qty: 30 | Fill #0

## 2018-12-23 ENCOUNTER — Emergency Department (HOSPITAL_COMMUNITY)
Admission: EM | Admit: 2018-12-23 | Discharge: 2018-12-23 | Disposition: A | Discharge status: Home / Self Care | Payer: Medicare Other | Attending: Emergency Medicine | Admitting: Emergency Medicine

## 2018-12-23 ENCOUNTER — Other Ambulatory Visit: Payer: Self-pay

## 2018-12-23 ENCOUNTER — Emergency Department (HOSPITAL_COMMUNITY): Payer: Medicare Other

## 2018-12-23 ENCOUNTER — Encounter (HOSPITAL_COMMUNITY): Payer: Self-pay

## 2018-12-23 DIAGNOSIS — I10 Essential (primary) hypertension: Secondary | ICD-10-CM | POA: Insufficient documentation

## 2018-12-23 DIAGNOSIS — M25552 Pain in left hip: Secondary | ICD-10-CM | POA: Diagnosis not present

## 2018-12-23 DIAGNOSIS — Z79899 Other long term (current) drug therapy: Secondary | ICD-10-CM | POA: Diagnosis not present

## 2018-12-23 DIAGNOSIS — Y999 Unspecified external cause status: Secondary | ICD-10-CM | POA: Diagnosis not present

## 2018-12-23 DIAGNOSIS — S72112A Displaced fracture of greater trochanter of left femur, initial encounter for closed fracture: Secondary | ICD-10-CM | POA: Diagnosis not present

## 2018-12-23 DIAGNOSIS — F17210 Nicotine dependence, cigarettes, uncomplicated: Secondary | ICD-10-CM | POA: Insufficient documentation

## 2018-12-23 DIAGNOSIS — Y939 Activity, unspecified: Secondary | ICD-10-CM | POA: Insufficient documentation

## 2018-12-23 DIAGNOSIS — R0902 Hypoxemia: Secondary | ICD-10-CM | POA: Diagnosis not present

## 2018-12-23 DIAGNOSIS — W19XXXA Unspecified fall, initial encounter: Secondary | ICD-10-CM | POA: Diagnosis not present

## 2018-12-23 DIAGNOSIS — R52 Pain, unspecified: Secondary | ICD-10-CM | POA: Diagnosis not present

## 2018-12-23 DIAGNOSIS — Y929 Unspecified place or not applicable: Secondary | ICD-10-CM | POA: Diagnosis not present

## 2018-12-23 DIAGNOSIS — S79912A Unspecified injury of left hip, initial encounter: Secondary | ICD-10-CM | POA: Diagnosis not present

## 2018-12-23 DIAGNOSIS — W010XXA Fall on same level from slipping, tripping and stumbling without subsequent striking against object, initial encounter: Secondary | ICD-10-CM | POA: Insufficient documentation

## 2018-12-23 DIAGNOSIS — S299XXA Unspecified injury of thorax, initial encounter: Secondary | ICD-10-CM | POA: Diagnosis not present

## 2018-12-23 MED ORDER — HYDROMORPHONE HCL 1 MG/ML IJ SOLN
1.0000 mg | Freq: Once | INTRAMUSCULAR | Status: AC
  Administered 2018-12-23: 18:00:00 1 mg via INTRAVENOUS
  Filled 2018-12-23: qty 1

## 2018-12-23 MED ORDER — HYDROCODONE-ACETAMINOPHEN 5-325 MG PO TABS: 1.0000 | ORAL_TABLET | Freq: Four times a day (QID) | ORAL | 0 refills | Status: DC | PRN

## 2018-12-23 MED ORDER — HYDROMORPHONE HCL 1 MG/ML IJ SOLN
1.0000 mg | Freq: Once | INTRAMUSCULAR | Status: AC
  Administered 2018-12-23: 1 mg via INTRAVENOUS
  Filled 2018-12-23: qty 1

## 2018-12-23 NOTE — ED Triage Notes (Signed)
Per PTAR, pt from home w/ a c/o left hip pain that began last night after a fall from standing. Pt reports he slipped on oil onto a tiled floor. No LOC. No head, neck, or back pain. Pt reports he has been unable to walk since the injury. Pt normally uses a cane to ambulate.  146/90 95% RA HR 79  Side note: a syringe with ~ 0.3 cc of amber/brown liquid was found with the pt. PTAR noted it during transport and was found on the stretcher as the pt transferred to the bed. Pt reported he did not know anything about the syringe and that it must have fallen out of his pants which are his brothers. He then told me to throw the syringe away.

## 2018-12-23 NOTE — ED Notes (Signed)
Patient transported to X-ray 

## 2018-12-23 NOTE — Discharge Instructions (Addendum)
It was our pleasure to provide your ER care today - we hope that you feel better.  Your xrays show a fracture of the greater trochanter of the proximal left femur.   Icepack/cold to sore area. Take ibuprofen as need. You may also take hydrocodone as need for pain. No driving for the next 6 hours or when taking hydrocodone. Also, do not take tylenol or acetaminophen containing medication when taking hydrocodone.  Use walker for support, stability, and to take pressure/weight off of left leg.   Follow up with orthopedic doctor in the coming week - call office Monday AM to arrange appointment. No driving until cleared to do so by orthopedist.  Return to ER if worse, new symptoms, intractable pain, numbness/weakness, other concern.

## 2018-12-23 NOTE — ED Notes (Signed)
Patient verbalizes understanding of discharge instructions. Opportunity for questioning and answers were provided. Armband removed by staff, pt discharged from ED.  

## 2018-12-23 NOTE — ED Provider Notes (Signed)
[facility] EMERGENCY DEPARTMENT Provider Note   CSN: [id number] Arrival date & time: 12/23/18  1704    History   Chief Complaint Chief Complaint  Patient presents with  . Fall    HPI Ascencion Coye is a 58 y.o. male.     Patient s/p slip and fall today just pta. Symptoms acute onset, episodic. States there was grease on floor which made it slippery. No faintness or dizziness prior to fall. No loc w fall. No head injury. States unable to stand/walk post fall due to left hip pain. No prior hip injury. At baseline walks w cane. Denies headache. No neck or back pain. No radicular pain. Left hip pain constant, dull, mod-severe, worse w movement, non radiating. No knee pain. Skin intact. States felt fine, at baseline prior to slip/fall. No anticoag use. Denies any other pain or injury.   The history is provided by the patient and the EMS personnel.  Fall  Pertinent negatives include no chest pain, no abdominal pain, no headaches and no shortness of breath.    Past Medical History:  Diagnosis Date  . Anxiety   . Arthritis    back, shoulders   . Constipation   . Depression   . Disc disorder   . Hypertension   . Lumbar herniated disc   . Muscle spasm     Patient Active Problem List   Diagnosis Date Noted  . Cervical myelopathy ([place]) 08/15/2018  . Foot laceration, right, initial encounter   . Radiculopathy 03/08/2018  . Tubular adenoma 05/16/2017  . Depressed mood 05/16/2017  . Substance abuse ([place]) 05/16/2017  . Hallux valgus 08/03/2016  . Disc disorder   . Lumbar facet arthropathy 08/06/2014  . Lumbar radiculopathy 08/06/2014  . ED (erectile dysfunction) 01/07/2014  . Unspecified vitamin D deficiency 01/07/2014  . Left shoulder pain 08/10/2013  . Chronic low back pain 08/10/2013  . Essential hypertension 08/10/2013  . Tobacco abuse 08/10/2013    Past Surgical History:  Procedure Laterality Date  . ANTERIOR CERVICAL DECOMP/DISCECTOMY FUSION N/A  03/08/2018   Procedure: ANTERIOR CERVICAL DECOMPRESSION/DISCECTOMY FUSION , cervical 3-4, cervical 4-5, cervical 5-6 with intrumentational and allograft;  Surgeon: Phylliss Bob, MD;  Location: [facility];  Service: Orthopedics;  Laterality: N/A;  . BONE BIOPSY Right 05/17/2018   Procedure: BONE BIOPSY X2;  Surgeon: Evelina Bucy, DPM;  Location: [facility];  Service: Podiatry;  Laterality: Right;  right second toe  . COLONOSCOPY    . FOOT SURGERY     right, ~ 2002  . POLYPECTOMY          Home Medications    Prior to Admission medications   Medication Sig Start Date End Date Taking? Authorizing Provider  amLODipine (NORVASC) 10 MG tablet Take 1 tablet (10 mg total) by mouth daily. 03/06/18   Ladell Pier, MD  cyclobenzaprine (FLEXERIL) 5 MG tablet TAKE 1 TABLET (5 MG TOTAL) BY MOUTH 2 (TWO) TIMES DAILY AS NEEDED FOR MUSCLE SPASMS. 12/12/18   Ladell Pier, MD  hydrochlorothiazide (HYDRODIURIL) 25 MG tablet Take 1 tablet (25 mg total) by mouth daily. 03/06/18   Ladell Pier, MD  methocarbamol (ROBAXIN) 500 MG tablet TK 1 T PO BID PRF SPASMS 07/11/18   [provider]  Multiple Vitamin (MULTIVITAMIN) tablet Take 1 tablet by mouth daily.    [provider]  sertraline (ZOLOFT) 50 MG tablet Take 1 tablet (50 mg total) by mouth daily. 09/29/18   Ladell Pier,  MD  sildenafil (VIAGRA) 100 MG tablet 1 tab PO 1/2 hr before intercourse PRN.  Limit use to 1 tab /24 hr period. Patient not taking: Reported on 05/25/2018 09/21/17   Ladell Pier, MD    Family History Family History  Problem Relation Age of Onset  . Diabetes Mother   . Hypertension Mother   . Heart disease Mother   . Hyperlipidemia Mother   . Diabetes Brother   . Diabetes Daughter   . Hypertension Sister   . Diabetes Sister   . Stomach cancer Maternal Grandfather   . Colon cancer Neg Hx   . Esophageal cancer Neg Hx   . Rectal cancer Neg Hx   . Colon polyps Neg Hx      Social History Social History   Tobacco Use  . Smoking status: Current Every Day Smoker    Packs/day: 0.10    Years: 20.00    Pack years: 2.00    Types: Cigarettes  . Smokeless tobacco: Never Used  . Tobacco comment: 5 cigs a day   Substance Use Topics  . Alcohol use: No  . Drug use: No     Allergies   Patient has no known allergies.   Review of Systems Review of Systems  Constitutional: Negative for fever.  HENT: Negative for sore throat.   Eyes: Negative for pain and visual disturbance.  Respiratory: Negative for cough and shortness of breath.   Cardiovascular: Negative for chest pain.  Gastrointestinal: Negative for abdominal pain and vomiting.  Genitourinary: Negative for flank pain.  Musculoskeletal: Negative for back pain and neck pain.  Skin: Negative for rash.  Neurological: Negative for weakness, numbness and headaches.  Hematological: Does not bruise/bleed easily.  Psychiatric/Behavioral: Negative for confusion.     Physical Exam Updated Vital Signs BP (!) 126/96 (BP Location: Right Arm)   Pulse 87   Temp 98.2 F (36.8 C) (Oral)   Resp 14   Ht 1.854 m (6\' 1" )   Wt 70.3 kg   SpO2 98%   BMI 20.45 kg/m   Physical Exam Vitals signs and nursing note reviewed.  Constitutional:      Appearance: Normal appearance. He is well-developed.  HENT:     Head: Atraumatic.     Nose: Nose normal.     Mouth/Throat:     Mouth: Mucous membranes are moist.     Pharynx: Oropharynx is clear.  Eyes:     General: No scleral icterus.    Conjunctiva/sclera: Conjunctivae normal.     Pupils: Pupils are equal, round, and reactive to light.  Neck:     Musculoskeletal: Normal range of motion and neck supple. No neck rigidity.     Trachea: No tracheal deviation.  Cardiovascular:     Rate and Rhythm: Normal rate and regular rhythm.     Pulses: Normal pulses.     Heart sounds: Normal heart sounds. No murmur. No friction rub. No gallop.   Pulmonary:     Effort:  Pulmonary effort is normal. No accessory muscle usage or respiratory distress.     Breath sounds: Normal breath sounds.  Chest:     Chest wall: No tenderness.  Abdominal:     General: Bowel sounds are normal. There is no distension.     Palpations: Abdomen is soft.     Tenderness: There is no abdominal tenderness. There is no guarding.  Genitourinary:    Comments: No cva tenderness. Musculoskeletal:        General: No swelling.  Comments: CTLS spine, non tender, aligned, no step off. Tenderness left hip and pain w minimal rom. Dp/pt palp bil. No pain w rom knee. No other focal bony tenderness noted on bil extremity exam.   Skin:    General: Skin is warm and dry.     Findings: No rash.  Neurological:     Mental Status: He is alert.     Comments: Alert, speech clear. Motor intact bil, stre 5/5. sens grossly intact.   Psychiatric:        Mood and Affect: Mood normal.      ED Treatments / Results  Labs (all labs ordered are listed, but only abnormal results are displayed) Labs Reviewed - No data to display  EKG None  Radiology Dg Chest 1 View  Result Date: 12/23/2018 CLINICAL DATA:  Fall, left hip pain EXAM: CHEST  1 VIEW COMPARISON:  None. FINDINGS: Heart and mediastinal contours are within normal limits. No focal opacities or effusions. No acute bony abnormality. IMPRESSION: No active disease. Electronically Signed   By: Rolm Baptise M.D.   On: 12/23/2018 18:11   Ct Hip Left Wo Contrast  Result Date: 12/23/2018 CLINICAL DATA:  Fall from standing last night EXAM: CT OF THE LEFT HIP WITHOUT CONTRAST TECHNIQUE: Multidetector CT imaging of the left hip was performed according to the standard protocol. Multiplanar CT image reconstructions were also generated. COMPARISON:  Hip radiography from earlier today FINDINGS: Bones/Joint/Cartilage Acute greater trochanter fracture of the femur with mild medial displacement. No continuation across the intertrochanteric femur seen. Intact  femoral neck. Left hip is located. Ligaments Suboptimally assessed by CT. Muscles and Tendons No evidence of major musculoligamentous disruption. Soft tissues Expected edema about the fracture.  Negative for joint effusion. IMPRESSION: Minimally displaced greater trochanter fracture of the left femur. Electronically Signed   By: Monte Fantasia M.D.   On: 12/23/2018 19:03   Dg Hip Unilat W Or W/o Pelvis 2-3 Views Left  Result Date: 12/23/2018 CLINICAL DATA:  Left hip pain since a slip and fall last night. Initial encounter. EXAM: DG HIP (WITH OR WITHOUT PELVIS) 2-3V LEFT COMPARISON:  None. FINDINGS: There is no evidence of hip fracture or dislocation. There no focal lesion. Mild degenerative disease about the hips noted IMPRESSION: No acute abnormality. Electronically Signed   By: Inge Rise M.D.   On: 12/23/2018 18:10    Procedures Procedures (including critical care time)  Medications Ordered in ED Medications  HYDROmorphone (DILAUDID) injection 1 mg (has no administration in time range)     Initial Impression / Assessment and Plan / ED Course  I have reviewed the triage vital signs and the nursing notes.  Pertinent labs & imaging results that were available during my care of the patient were reviewed by me and considered in my medical decision making (see chart for details).  Iv ns. Dilaudid 1 mg iv.   Reviewed nursing notes and prior charts for additional history.   Imaging ordered.   Xrays reviewed by me - ?tuberosity/trochanter fx. Radiology read neg acute. Will get ct imaging as pt unable to stand/walk due to pain.   CT reviewed by me - greater trochanter fx.   Ortho consulted. Discussed pt with Dr Marlou Sa who reviewed images - he indicates d/c, use walker, f/u as outpt.   Recheck pt, pain improved. Discussed plan.   Pt currently appears stable for d/c.       Final Clinical Impressions(s) / ED Diagnoses   Final diagnoses:  None  ED Discharge Orders    None        Lajean Saver, MD 12/23/18 2103

## 2018-12-23 NOTE — ED Notes (Signed)
Patient transported to CT 

## 2018-12-23 NOTE — Progress Notes (Signed)
Orthopedic Tech Progress Note Patient Details:  Calvin [name] 09/09/1960 [id number]  Ortho Devices Type of Ortho Device: Crutches Ortho Device/Splint Interventions: Ordered, Application, Adjustment   Post Interventions Patient Tolerated: Well Instructions Provided: Care of device, Adjustment of device   Karolee Stamps 12/23/2018, 11:33 PM

## 2019-01-03 ENCOUNTER — Telehealth: Payer: Self-pay | Admitting: Internal Medicine

## 2019-01-03 MED FILL — CYCLOBENZAPRINE 5 MG TABLET: 5 | 15 days supply | Qty: 30 | Fill #1

## 2019-01-03 NOTE — Telephone Encounter (Signed)
New Message   Pt is calling states he is having a lot of hip pain and would like to be prescribed something for the pain. Pt states he can not wait until his appt. Please f/u

## 2019-01-03 NOTE — Telephone Encounter (Signed)
Will froward to pcp

## 2019-01-05 ENCOUNTER — Other Ambulatory Visit: Payer: Self-pay | Admitting: Internal Medicine

## 2019-01-05 ENCOUNTER — Other Ambulatory Visit: Payer: Self-pay

## 2019-01-05 ENCOUNTER — Ambulatory Visit: Payer: Medicare Other | Attending: Internal Medicine | Admitting: Internal Medicine

## 2019-01-05 ENCOUNTER — Encounter: Payer: Self-pay | Admitting: Internal Medicine

## 2019-01-05 DIAGNOSIS — R252 Cramp and spasm: Secondary | ICD-10-CM

## 2019-01-05 DIAGNOSIS — I10 Essential (primary) hypertension: Secondary | ICD-10-CM | POA: Diagnosis not present

## 2019-01-05 DIAGNOSIS — W19XXXA Unspecified fall, initial encounter: Secondary | ICD-10-CM

## 2019-01-05 DIAGNOSIS — F17200 Nicotine dependence, unspecified, uncomplicated: Secondary | ICD-10-CM

## 2019-01-05 DIAGNOSIS — S72002D Fracture of unspecified part of neck of left femur, subsequent encounter for closed fracture with routine healing: Secondary | ICD-10-CM

## 2019-01-05 DIAGNOSIS — S72002A Fracture of unspecified part of neck of left femur, initial encounter for closed fracture: Secondary | ICD-10-CM

## 2019-01-05 DIAGNOSIS — F17210 Nicotine dependence, cigarettes, uncomplicated: Secondary | ICD-10-CM | POA: Diagnosis not present

## 2019-01-05 DIAGNOSIS — M5416 Radiculopathy, lumbar region: Secondary | ICD-10-CM

## 2019-01-05 MED ORDER — HYDROCHLOROTHIAZIDE 25 MG PO TABS: 25.0000 mg | ORAL_TABLET | Freq: Every day | ORAL | 3 refills | Status: DC

## 2019-01-05 MED ORDER — AMLODIPINE BESYLATE 10 MG PO TABS: 10.0000 mg | ORAL_TABLET | Freq: Every day | ORAL | 3 refills | Status: DC

## 2019-01-05 MED ORDER — HYDROCODONE-ACETAMINOPHEN 10-325 MG PO TABS: 1.0000 | ORAL_TABLET | Freq: Three times a day (TID) | ORAL | 0 refills | Status: DC | PRN

## 2019-01-05 MED FILL — HYDROCHLOROTHIAZIDE 25 MG T: 25 | 90 days supply | Qty: 90 | Fill #0

## 2019-01-05 MED FILL — AMLODIPINE BESYLATE 10 MG T: 10 | 90 days supply | Qty: 90 | Fill #0

## 2019-01-05 NOTE — Progress Notes (Signed)
Virtual Visit via Telephone Note Due to current restrictions/limitations of in-office visits due to the COVID-19 pandemic, this scheduled clinical appointment was converted to a telehealth visit  I connected with Calvin [name] on 01/05/19 at 11:43 a.m EDT by telephone and verified that I am speaking with the correct person using two identifiers. I am in my office.  The patient is at home.  Only the patient and myself participated in this encounter.  I discussed the limitations, risks, security and privacy concerns of performing an evaluation and management service by telephone and the availability of in person appointments. I also discussed with the patient that there may be a patient responsible charge related to this service. The patient expressed understanding and agreed to proceed.   History of Present Illness: Pt with hx of HTN, chronic LBP, ED, tobacco, cocaine use, depression  Patient complains of pain in his left hip and is requesting pain medication. Slipped and fell on tile in his kitchen  12/23/2018.  Seen in the ER the same day for pain in the left hip.  CAT scan revealed minimally displaced greater trochanteric fracture of the left femur -The ER provider spoke with orthopedics specialist Dr. Marlou Sa who recommended patient be discharged home with a walker and schedule a follow-up appointment.  Patient was not given a follow-up appointment.  He was discharged home with a limited supply of Norco. -Since discharge, patient states that he has had problem getting around in his house and is requesting home health aide for short period to assist with ADLs.  He has problems dressing, bathing, transferring and cooking. -He has been using the walker.  It hurts to put weight on the left leg.  Hurts to lay on the left side.  He was unable to sleep for the past several days as he has been out of pain medication.  HTN: No device to check blood pressure but he has access to 1 through his sister.  Reports  compliance with Norvasc but has been out of HCTZ.  He tries to limit salt in the foods as much as possible.    Tob dep: On last visit with me he had quit completely.  Admits that he still smokes occasionally  Observations/Objective: No direct observation done as this was a telephone encounter.  Assessment and Plan: 1. Closed fracture of left hip, initial encounter [facility]) -Referral given to orthopedics. Limited supply of Norco given.  [place] reviewed. We will submit referral for home health aide - Ambulatory referral to Orthopedic Surgery - HYDROcodone-acetaminophen (NORCO) 10-325 MG tablet; Take 1 tablet by mouth every 8 (eight) hours as needed.  Dispense: 30 tablet; Refill: 0  2. Essential hypertension Continue amlodipine and hydrochlorothiazide.  If he does get access to a blood pressure monitoring device, advised patient to check blood pressure at least twice a week with goal being 130/80 or lower. - amLODipine (NORVASC) 10 MG tablet; Take 1 tablet (10 mg total) by mouth daily.  Dispense: 90 tablet; Refill: 3 - hydrochlorothiazide (HYDRODIURIL) 25 MG tablet; Take 1 tablet (25 mg total) by mouth daily.  Dispense: 90 tablet; Refill: 3  3. Tobacco dependence Advised to quit completely.  Patient states that for the most part he has quit but occasionally he slips up. Less than 5 minutes spent on counseling   Follow Up Instructions: Follow-up in 3 months   I discussed the assessment and treatment plan with the patient. The patient was provided an opportunity to ask questions and all were answered. The patient  agreed with the plan and demonstrated an understanding of the instructions.   The patient was advised to call back or seek an in-person evaluation if the symptoms worsen or if the condition fails to improve as anticipated.  I provided 12 minutes of non-face-to-face time during this encounter.   Karle Plumber, MD

## 2019-01-05 NOTE — Progress Notes (Signed)
Pt states the pain is radiating down to his groin   Pt is requesting something for the pain pt states he doesn't mind getting iburpofen 800mg  he is just needing something   Pt states he is using a walker at this moment

## 2019-01-08 ENCOUNTER — Ambulatory Visit (INDEPENDENT_AMBULATORY_CARE_PROVIDER_SITE_OTHER): Payer: Medicare Other

## 2019-01-08 ENCOUNTER — Telehealth: Payer: Self-pay

## 2019-01-08 ENCOUNTER — Ambulatory Visit (INDEPENDENT_AMBULATORY_CARE_PROVIDER_SITE_OTHER): Payer: Medicare Other | Admitting: Orthopedic Surgery

## 2019-01-08 ENCOUNTER — Encounter: Payer: Self-pay | Admitting: Orthopedic Surgery

## 2019-01-08 ENCOUNTER — Other Ambulatory Visit: Payer: Self-pay

## 2019-01-08 DIAGNOSIS — S62397A Other fracture of fifth metacarpal bone, left hand, initial encounter for closed fracture: Secondary | ICD-10-CM | POA: Diagnosis not present

## 2019-01-08 DIAGNOSIS — S72112A Displaced fracture of greater trochanter of left femur, initial encounter for closed fracture: Secondary | ICD-10-CM

## 2019-01-08 DIAGNOSIS — M79642 Pain in left hand: Secondary | ICD-10-CM | POA: Diagnosis not present

## 2019-01-08 NOTE — Progress Notes (Signed)
Office Visit Note   Patient: Calvin [name]           Date of Birth: 1961/03/13           MRN: [id number] Visit Date: 01/08/2019 Requested by: Ladell Pier, MD [place],  [place] [address] PCP: Ladell Pier, MD  Subjective: Chief Complaint  Patient presents with  . Left Hip - Injury   Calvin [name] is a patient who slipped on grease 12/23/2018 and injured his left hip.  He also injured his left hand at that time.  He is right-hand dominant.  He is disabled.  Had radiographs done at the hospital which showed greater trochanteric avulsion fracture.  CT scan at that time also did not show communication with the lesser trochanteric region.  He has been limited weightbearing.  He did have spinal surgery and would like to get back to walking. HPI: See above              ROS: All systems reviewed are negative as they relate to the chief complaint within the history of present illness.  Patient denies  fevers or chills.   Assessment & Plan: Visit Diagnoses:  1. Other fracture of fifth metacarpal bone, left hand, initial encounter for closed fracture   2. Closed avulsion fracture of greater trochanter of left femur, initial encounter ([place])     Plan: Impression is no significant displacement of left greater trochanteric avulsion fracture with no evidence of fracture extension into the intertrochanteric region.  No evidence of pathologic process involving that proximal femur.  In regards to the left hand the patient has a fracture of the fifth metacarpal which will not require surgery.  Pretty stable at this time.  For that I would like for him to avoid lifting and we will get him a rolling walker that has an elbow attachment so that he can weight-bear through the left arm as opposed to the left hand.  I will see him back in 3 weeks for repeat radiographs on both and likely advancement to weightbearing as tolerated.  Follow-Up Instructions: Return in about 3 weeks (around 01/29/2019).    Orders:  Orders Placed This Encounter  Procedures  . XR Hand Complete Left  . XR HIP UNILAT W OR W/O PELVIS 2-3 VIEWS LEFT   No orders of the defined types were placed in this encounter.     Procedures: No procedures performed   Clinical Data: No additional findings.  Objective: Vital Signs: There were no vitals taken for this visit.  Physical Exam:   Constitutional: Patient appears well-developed HEENT:  Head: Normocephalic Eyes:EOM are normal Neck: Normal range of motion Cardiovascular: Normal rate Pulmonary/chest: Effort normal Neurologic: Patient is alert Skin: Skin is warm Psychiatric: Patient has normal mood and affect    Ortho Exam: Ortho exam demonstrates mild weakness with hip abduction and flexion on the left compared to the right.  Not much in the way of groin pain with internal X rotation of that left leg.  The left hand demonstrates some tenderness and swelling around the fifth metacarpal region but no rotational deformity with a clenched fist.  He is lacking about a centimeter from getting to the palmar flexion crease.  Specialty Comments:  No specialty comments available.  Imaging: Xr Hip Unilat W Or W/o Pelvis 2-3 Views Left  Result Date: 01/08/2019 AP pelvis lateral left hip reviewed.  Greater trochanteric avulsion fracture is displaced about 5 mm.  There is no evidence of extension  of that fracture into the intertrochanteric or basicervical region of that femoral neck.  Xr Hand Complete Left  Result Date: 01/08/2019 AP lateral oblique left hand reviewed.  Spiral fracture left fourth metacarpal is present.  No significant no significant rotational malalignment present.  Shortening is present.  Mild degenerative changes throughout the radiocarpal joint noted.    PMFS History: Patient Active Problem List   Diagnosis Date Noted  . Cervical myelopathy ([place]) 08/15/2018  . Foot laceration, right, initial encounter   . Radiculopathy 03/08/2018  .  Tubular adenoma 05/16/2017  . Depressed mood 05/16/2017  . Substance abuse ([place]) 05/16/2017  . Hallux valgus 08/03/2016  . Disc disorder   . Lumbar facet arthropathy 08/06/2014  . Lumbar radiculopathy 08/06/2014  . ED (erectile dysfunction) 01/07/2014  . Unspecified vitamin D deficiency 01/07/2014  . Left shoulder pain 08/10/2013  . Chronic low back pain 08/10/2013  . Essential hypertension 08/10/2013  . Tobacco abuse 08/10/2013   Past Medical History:  Diagnosis Date  . Anxiety   . Arthritis    back, shoulders   . Constipation   . Depression   . Disc disorder   . Hypertension   . Lumbar herniated disc   . Muscle spasm     Family History  Problem Relation Age of Onset  . Diabetes Mother   . Hypertension Mother   . Heart disease Mother   . Hyperlipidemia Mother   . Diabetes Brother   . Diabetes Daughter   . Hypertension Sister   . Diabetes Sister   . Stomach cancer Maternal Grandfather   . Colon cancer Neg Hx   . Esophageal cancer Neg Hx   . Rectal cancer Neg Hx   . Colon polyps Neg Hx     Past Surgical History:  Procedure Laterality Date  . ANTERIOR CERVICAL DECOMP/DISCECTOMY FUSION N/A 03/08/2018   Procedure: ANTERIOR CERVICAL DECOMPRESSION/DISCECTOMY FUSION , cervical 3-4, cervical 4-5, cervical 5-6 with intrumentational and allograft;  Surgeon: Phylliss Bob, MD;  Location: [facility];  Service: Orthopedics;  Laterality: N/A;  . BONE BIOPSY Right 05/17/2018   Procedure: BONE BIOPSY X2;  Surgeon: Evelina Bucy, DPM;  Location: [facility];  Service: Podiatry;  Laterality: Right;  right second toe  . COLONOSCOPY    . FOOT SURGERY     right, ~ 2002  . POLYPECTOMY     Social History   Occupational History  . Not on file  Tobacco Use  . Smoking status: Current Every Day Smoker    Packs/day: 0.10    Years: 20.00    Pack years: 2.00    Types: Cigarettes  . Smokeless tobacco: Never Used  . Tobacco comment: 5 cigs a day   Substance and Sexual  Activity  . Alcohol use: No  . Drug use: No  . Sexual activity: Not Currently

## 2019-01-08 NOTE — Telephone Encounter (Signed)
Referral for short term PCS faxed to [employer].

## 2019-01-16 ENCOUNTER — Telehealth: Payer: Self-pay

## 2019-01-16 NOTE — Telephone Encounter (Signed)
PCS documents refaxed to [employer] with requested information.

## 2019-01-18 ENCOUNTER — Telehealth: Payer: Self-pay | Admitting: Internal Medicine

## 2019-01-18 ENCOUNTER — Telehealth: Payer: Self-pay

## 2019-01-18 ENCOUNTER — Other Ambulatory Visit: Payer: Self-pay | Admitting: *Deleted

## 2019-01-18 MED ORDER — HYDROCODONE-ACETAMINOPHEN 5-325 MG PO TABS: 1.0000 | ORAL_TABLET | Freq: Three times a day (TID) | ORAL | 0 refills | Status: DC | PRN

## 2019-01-18 NOTE — Telephone Encounter (Signed)
Dr.  Margarita Rana, per Dr. Wynetta Emery note, I was unable to call this medication into the pharmacy. Would you be able to send to the pharmacy?

## 2019-01-18 NOTE — Telephone Encounter (Signed)
Patient called requesting medication refill for HYDROcodone-acetaminophen (NORCO) 10-325 MG tablet  Patient states he only has 7 pills left and that the pain in his hip is unbearable.  Please advise [phone]  Thank you Emmit Pomfret

## 2019-01-18 NOTE — Telephone Encounter (Signed)
Call placed to [facility], spoke to Freeman who stated that the assessment was completed 01/17/2019

## 2019-01-18 NOTE — Telephone Encounter (Signed)
Name and DOB verified. Spoke to Calvin [name]. He  states that he takes 3-4 or more pills during the day for his pain. Explained that the medication was sent on June 12th and based on the directions he should have more medication. Asked patient if he tries to take Ibuprofen between doses. He replied that he does take ibuprofen daily.   Instructed not to take Tylenol because he could get too much of the Tylenol with in the pain medication. Patient verbalized understanding.

## 2019-01-22 ENCOUNTER — Other Ambulatory Visit: Payer: Self-pay | Admitting: Internal Medicine

## 2019-01-22 DIAGNOSIS — R4589 Other symptoms and signs involving emotional state: Secondary | ICD-10-CM

## 2019-01-22 MED ORDER — HYDROCODONE-ACETAMINOPHEN 5-325 MG PO TABS: 1.0000 | ORAL_TABLET | Freq: Three times a day (TID) | ORAL | 0 refills | Status: DC | PRN

## 2019-01-22 MED FILL — SERTRALINE HCL 50 MG TABS: 50 | 30 days supply | Qty: 30 | Fill #0

## 2019-01-22 MED FILL — CYCLOBENZAPRINE 5 MG TABLET: 5 | 15 days supply | Qty: 30 | Fill #0

## 2019-01-22 NOTE — Telephone Encounter (Signed)
Pt informed medication Rx will be at the front desk. He will have his sister to pick up Rx.

## 2019-01-22 NOTE — Telephone Encounter (Signed)
On Thursday I was told by the pharmacy that I would not be able to phone in this Rx, because it was a class II.    Dr. Margarita Rana attempted to refill this medication via the electronic finger method that you use but I verified with the pharmamcy and it hasn't been received.   Would either of you be able to resend this medication to the pharmacy for him.

## 2019-01-22 NOTE — Telephone Encounter (Signed)
Pt called to check on his status of his medication refill, and informed refill was available..please inform pt of pcp message

## 2019-01-23 ENCOUNTER — Telehealth: Payer: Self-pay | Admitting: Internal Medicine

## 2019-01-23 NOTE — Telephone Encounter (Signed)
Pt gave verbal authorization for Secundino Ginger to pick up hard copy prescription today. Documented and signed and released.

## 2019-01-25 ENCOUNTER — Telehealth: Payer: Self-pay

## 2019-01-25 NOTE — Telephone Encounter (Signed)
Patient called concerning a rolling walker per Dr. Randel Pigg last office note.  Cb# is [phone].  Please advise.  Thank you.

## 2019-01-30 NOTE — Telephone Encounter (Signed)
Tried calling to discuss. No answer.

## 2019-02-16 ENCOUNTER — Telehealth: Payer: Self-pay | Admitting: Orthopedic Surgery

## 2019-02-16 NOTE — Telephone Encounter (Signed)
Patient called and stated Dr. Marlou Sa stated that he was going to get him a walker.  Please call patient @ [phone]

## 2019-02-16 NOTE — Telephone Encounter (Signed)
IC LM for patient advising order for rolling walker was at front desk for him to pick up.

## 2019-03-02 ENCOUNTER — Telehealth: Payer: Self-pay | Admitting: Internal Medicine

## 2019-03-02 MED FILL — SERTRALINE HCL 50 MG TABS: 50 | 30 days supply | Qty: 30 | Fill #1

## 2019-03-02 MED FILL — CYCLOBENZAPRINE 5 MG TABLET: 5 | 15 days supply | Qty: 30 | Fill #1

## 2019-03-02 NOTE — Telephone Encounter (Signed)
1) Medication(s) Requested (by name): sertraline (ZOLOFT) 50 MG tablet [[id number]]  HYDROcodone-acetaminophen (NORCO/VICODIN) 5-325 MG tablet [[id number]]    2) Pharmacy of Choice: [facility]  3) Special Requests:   Approved medications will be sent to the pharmacy, we will reach out if there is an issue.  Requests made after 3pm may not be addressed until the following business day!  If a patient is unsure of the name of the medication(s) please note and ask patient to call back when they are able to provide all info, do not send to responsible party until all information is available!

## 2019-03-02 NOTE — Telephone Encounter (Signed)
Sertraline had refills with the pharmacy, this office does not refill narcotics, the patient is aware of this policy.

## 2019-03-29 ENCOUNTER — Ambulatory Visit (INDEPENDENT_AMBULATORY_CARE_PROVIDER_SITE_OTHER): Payer: Medicare Other | Admitting: Orthopedic Surgery

## 2019-03-29 ENCOUNTER — Ambulatory Visit (INDEPENDENT_AMBULATORY_CARE_PROVIDER_SITE_OTHER): Payer: Medicare Other

## 2019-03-29 ENCOUNTER — Encounter: Payer: Self-pay | Admitting: Orthopedic Surgery

## 2019-03-29 ENCOUNTER — Other Ambulatory Visit: Payer: Self-pay

## 2019-03-29 DIAGNOSIS — S72112A Displaced fracture of greater trochanter of left femur, initial encounter for closed fracture: Secondary | ICD-10-CM

## 2019-03-29 DIAGNOSIS — S62397A Other fracture of fifth metacarpal bone, left hand, initial encounter for closed fracture: Secondary | ICD-10-CM

## 2019-03-29 NOTE — Progress Notes (Signed)
Office Visit Note   Patient: Calvin [name]           Date of Birth: August 27, 1960           MRN: [id number] Visit Date: 03/29/2019 Requested by: Ladell Pier, MD [place],  [place] [address] PCP: Ladell Pier, MD  Subjective: Chief Complaint  Patient presents with  . Left Hip - Follow-up, Fracture    HPI: Calvin [name] is a 58 y.o. male who presents to the office complaining of L hip pain.  Pt is returning to the office s/p L hip greater trochanter avulsion fracture on 12/23/18.  Pt notes L groin pain that has been improving over the past couple months.  Majority of his pain is located in his lumbar spine. Denies any radicular symptoms or numbness/tingling.  Takes Ibuprofen and occ BC Powder for pain.  He has had a CT in the past that showed no intertrochanteric involvement of the fracture. He has not started PT and has been PWB with his walker.                ROS:  All systems reviewed are negative as they relate to the chief complaint within the history of present illness.  Patient denies  fevers or chills.   Assessment & Plan: Visit Diagnoses:  1. Other fracture of fifth metacarpal bone, left hand, initial encounter for closed fracture   2. Closed avulsion fracture of greater trochanter of left femur, initial encounter ([place])     Plan: Pt is a 58 y.o. Male who presents to the clinic ~3 months out from a L greater trochanter fracture.  XR's taken today show no significant change in fracture position.  He has little pain on exam of the L hip.  Pt will begin PT, where he will progress from [facility] with a walker to FWB with a cane over the course of a month, with therapy's assistance.  He will f/u with the office in 4 weeks.   Patient seen and examined.  I agree with Luke's note.  Follow-Up Instructions: No follow-ups on file.   Orders:  Orders Placed This Encounter  Procedures  . XR HIP UNILAT W OR W/O PELVIS 2-3 VIEWS LEFT   No orders of the defined types were  placed in this encounter.     Procedures: No procedures performed   Clinical Data: No additional findings.  Objective: Vital Signs: There were no vitals taken for this visit.  Physical Exam:    Constitutional: Patient appears well-developed HEENT:  Head: Normocephalic Eyes:EOM are normal Neck: Normal range of motion Cardiovascular: Normal rate Pulmonary/chest: Effort normal Neurologic: Patient is alert Skin: Skin is warm Psychiatric: Patient has normal mood and affect    Ortho Exam:  L hip exam Demonstrates good hip flexion abduction adduction strength.  No real pain with internal/external rotation in the groin.  No real trochanteric tenderness.  Specialty Comments:  No specialty comments available.  Imaging: No results found.   PMFS History: Patient Active Problem List   Diagnosis Date Noted  . Cervical myelopathy ([place]) 08/15/2018  . Foot laceration, right, initial encounter   . Radiculopathy 03/08/2018  . Tubular adenoma 05/16/2017  . Depressed mood 05/16/2017  . Substance abuse ([place]) 05/16/2017  . Hallux valgus 08/03/2016  . Disc disorder   . Lumbar facet arthropathy 08/06/2014  . Lumbar radiculopathy 08/06/2014  . ED (erectile dysfunction) 01/07/2014  . Unspecified vitamin D deficiency 01/07/2014  . Left shoulder pain 08/10/2013  .  Chronic low back pain 08/10/2013  . Essential hypertension 08/10/2013  . Tobacco abuse 08/10/2013   Past Medical History:  Diagnosis Date  . Anxiety   . Arthritis    back, shoulders   . Constipation   . Depression   . Disc disorder   . Hypertension   . Lumbar herniated disc   . Muscle spasm     Family History  Problem Relation Age of Onset  . Diabetes Mother   . Hypertension Mother   . Heart disease Mother   . Hyperlipidemia Mother   . Diabetes Brother   . Diabetes Daughter   . Hypertension Sister   . Diabetes Sister   . Stomach cancer Maternal Grandfather   . Colon cancer Neg Hx   . Esophageal cancer  Neg Hx   . Rectal cancer Neg Hx   . Colon polyps Neg Hx     Past Surgical History:  Procedure Laterality Date  . ANTERIOR CERVICAL DECOMP/DISCECTOMY FUSION N/A 03/08/2018   Procedure: ANTERIOR CERVICAL DECOMPRESSION/DISCECTOMY FUSION , cervical 3-4, cervical 4-5, cervical 5-6 with intrumentational and allograft;  Surgeon: Phylliss Bob, MD;  Location: [facility];  Service: Orthopedics;  Laterality: N/A;  . BONE BIOPSY Right 05/17/2018   Procedure: BONE BIOPSY X2;  Surgeon: Evelina Bucy, DPM;  Location: [facility];  Service: Podiatry;  Laterality: Right;  right second toe  . COLONOSCOPY    . FOOT SURGERY     right, ~ 2002  . POLYPECTOMY     Social History   Occupational History  . Not on file  Tobacco Use  . Smoking status: Current Every Day Smoker    Packs/day: 0.10    Years: 20.00    Pack years: 2.00    Types: Cigarettes  . Smokeless tobacco: Never Used  . Tobacco comment: 5 cigs a day   Substance and Sexual Activity  . Alcohol use: No  . Drug use: No  . Sexual activity: Not Currently

## 2019-04-05 ENCOUNTER — Telehealth: Payer: Self-pay | Admitting: Orthopedic Surgery

## 2019-04-05 ENCOUNTER — Other Ambulatory Visit: Payer: Self-pay | Admitting: Internal Medicine

## 2019-04-05 DIAGNOSIS — R252 Cramp and spasm: Secondary | ICD-10-CM

## 2019-04-05 DIAGNOSIS — S62397A Other fracture of fifth metacarpal bone, left hand, initial encounter for closed fracture: Secondary | ICD-10-CM

## 2019-04-05 DIAGNOSIS — M5416 Radiculopathy, lumbar region: Secondary | ICD-10-CM

## 2019-04-05 MED FILL — AMLODIPINE BESYLATE 10 MG T: 10 | 90 days supply | Qty: 90 | Fill #1

## 2019-04-05 MED FILL — CYCLOBENZAPRINE 5 MG TABLET: 5 | 15 days supply | Qty: 30 | Fill #0

## 2019-04-05 MED FILL — HYDROCHLOROTHIAZIDE 25 MG T: 25 | 90 days supply | Qty: 90 | Fill #1

## 2019-04-05 MED FILL — SERTRALINE HCL 50 MG TABS: 50 | 30 days supply | Qty: 30 | Fill #2

## 2019-04-05 NOTE — Telephone Encounter (Signed)
Patient called. Says the place he was referred to for PT does not accept MCD and Medicare. His call back number is [phone]

## 2019-04-05 NOTE — Telephone Encounter (Signed)
Referral put in for [facility] facility.

## 2019-04-13 ENCOUNTER — Encounter

## 2019-04-16 ENCOUNTER — Ambulatory Visit: Payer: Medicare Other

## 2019-04-25 ENCOUNTER — Ambulatory Visit: Payer: Medicare Other | Admitting: Physical Therapy

## 2019-04-30 ENCOUNTER — Other Ambulatory Visit: Payer: Self-pay

## 2019-04-30 ENCOUNTER — Telehealth: Payer: Self-pay | Admitting: Orthopedic Surgery

## 2019-04-30 ENCOUNTER — Ambulatory Visit: Payer: Medicare Other | Admitting: Physical Therapy

## 2019-04-30 DIAGNOSIS — S72112A Displaced fracture of greater trochanter of left femur, initial encounter for closed fracture: Secondary | ICD-10-CM

## 2019-04-30 NOTE — Telephone Encounter (Signed)
Daisy with [facility] called. They need a referral for PT on his hip. The referral they have says finger. Her call back number is [phone]

## 2019-04-30 NOTE — Telephone Encounter (Signed)
Order put in.

## 2019-05-07 ENCOUNTER — Other Ambulatory Visit: Payer: Self-pay | Admitting: Internal Medicine

## 2019-05-07 DIAGNOSIS — M5416 Radiculopathy, lumbar region: Secondary | ICD-10-CM

## 2019-05-07 DIAGNOSIS — R4589 Other symptoms and signs involving emotional state: Secondary | ICD-10-CM

## 2019-05-07 DIAGNOSIS — R252 Cramp and spasm: Secondary | ICD-10-CM

## 2019-05-08 MED FILL — CYCLOBENZAPRINE 5 MG TABLET: 5 | 15 days supply | Qty: 30 | Fill #0

## 2019-05-08 MED FILL — SERTRALINE HCL 50 MG TABS: 50 | 30 days supply | Qty: 30 | Fill #0

## 2019-05-28 ENCOUNTER — Ambulatory Visit: Payer: Medicare Other | Admitting: Physical Therapy

## 2019-05-29 ENCOUNTER — Ambulatory Visit: Payer: Medicare Other | Admitting: Physical Therapy

## 2019-05-30 ENCOUNTER — Ambulatory Visit: Payer: Medicare Other | Admitting: Orthopedic Surgery

## 2019-06-07 ENCOUNTER — Other Ambulatory Visit: Payer: Self-pay | Admitting: Internal Medicine

## 2019-06-07 DIAGNOSIS — R252 Cramp and spasm: Secondary | ICD-10-CM

## 2019-06-07 DIAGNOSIS — M5416 Radiculopathy, lumbar region: Secondary | ICD-10-CM

## 2019-06-07 MED FILL — SERTRALINE HCL 50 MG TABS: 50 | 30 days supply | Qty: 30 | Fill #1

## 2019-06-07 NOTE — Telephone Encounter (Signed)
Last filled 10/12. Please refill if appropriate

## 2019-06-08 MED FILL — CYCLOBENZAPRINE 5 MG TABLET: 5 | 15 days supply | Qty: 30 | Fill #0

## 2019-06-11 ENCOUNTER — Encounter: Payer: Self-pay | Admitting: Orthopedic Surgery

## 2019-06-11 ENCOUNTER — Ambulatory Visit (INDEPENDENT_AMBULATORY_CARE_PROVIDER_SITE_OTHER): Payer: Medicare Other | Admitting: Orthopedic Surgery

## 2019-06-11 ENCOUNTER — Ambulatory Visit (INDEPENDENT_AMBULATORY_CARE_PROVIDER_SITE_OTHER): Payer: Medicare Other

## 2019-06-11 ENCOUNTER — Other Ambulatory Visit: Payer: Self-pay

## 2019-06-11 DIAGNOSIS — S72112A Displaced fracture of greater trochanter of left femur, initial encounter for closed fracture: Secondary | ICD-10-CM | POA: Diagnosis not present

## 2019-06-11 NOTE — Progress Notes (Signed)
Post-Op Visit Note   Patient: Calvin [name]           Date of Birth: [birth year]           MRN: [id number] Visit Date: 06/11/2019 PCP: Ladell Pier, MD   Assessment & Plan:  Chief Complaint:  Chief Complaint  Patient presents with  . Left Hip - Follow-up   Visit Diagnoses:  1. Closed avulsion fracture of greater trochanter of left femur, initial encounter ([place])     Plan: Abdirahim is now about 2 months out left hip greater trochanteric fracture.  Radiographs today show no change in fracture alignment.  He is able to fully weight-bear.  Like him to go to [facility] outpatient therapy on [facility] because that is convenient for him.  Do that about twice a week for 6 weeks for gait training and strengthening and we will see him back as needed  Follow-Up Instructions: Return if symptoms worsen or fail to improve.   Orders:  Orders Placed This Encounter  Procedures  . XR HIP UNILAT W OR W/O PELVIS 2-3 VIEWS LEFT   No orders of the defined types were placed in this encounter.   Imaging: Xr Hip Unilat W Or W/o Pelvis 2-3 Views Left  Result Date: 06/11/2019 AP pelvis lateral left hip reviewed.  Greater trochanteric fracture is present with interval healing and no evidence of fracture displacement.  Hip joint appears preserved.   PMFS History: Patient Active Problem List   Diagnosis Date Noted  . Cervical myelopathy ([place]) 08/15/2018  . Foot laceration, right, initial encounter   . Radiculopathy 03/08/2018  . Tubular adenoma 05/16/2017  . Depressed mood 05/16/2017  . Substance abuse ([place]) 05/16/2017  . Hallux valgus 08/03/2016  . Disc disorder   . Lumbar facet arthropathy 08/06/2014  . Lumbar radiculopathy 08/06/2014  . ED (erectile dysfunction) 01/07/2014  . Unspecified vitamin D deficiency 01/07/2014  . Left shoulder pain 08/10/2013  . Chronic low back pain 08/10/2013  . Essential hypertension 08/10/2013  . Tobacco abuse 08/10/2013   Past Medical History:  Diagnosis  Date  . Anxiety   . Arthritis    back, shoulders   . Constipation   . Depression   . Disc disorder   . Hypertension   . Lumbar herniated disc   . Muscle spasm     Family History  Problem Relation Age of Onset  . Diabetes Mother   . Hypertension Mother   . Heart disease Mother   . Hyperlipidemia Mother   . Diabetes Brother   . Diabetes Daughter   . Hypertension Sister   . Diabetes Sister   . Stomach cancer Maternal Grandfather   . Colon cancer Neg Hx   . Esophageal cancer Neg Hx   . Rectal cancer Neg Hx   . Colon polyps Neg Hx     Past Surgical History:  Procedure Laterality Date  . ANTERIOR CERVICAL DECOMP/DISCECTOMY FUSION N/A 03/08/2018   Procedure: ANTERIOR CERVICAL DECOMPRESSION/DISCECTOMY FUSION , cervical 3-4, cervical 4-5, cervical 5-6 with intrumentational and allograft;  Surgeon: Phylliss Bob, MD;  Location: [facility];  Service: Orthopedics;  Laterality: N/A;  . BONE BIOPSY Right 05/17/2018   Procedure: BONE BIOPSY X2;  Surgeon: Evelina Bucy, DPM;  Location: [facility];  Service: Podiatry;  Laterality: Right;  right second toe  . COLONOSCOPY    . FOOT SURGERY     right, ~ 2002  . POLYPECTOMY     Social History   Occupational History  .  Not on file  Tobacco Use  . Smoking status: Current Every Day Smoker    Packs/day: 0.10    Years: 20.00    Pack years: 2.00    Types: Cigarettes  . Smokeless tobacco: Never Used  . Tobacco comment: 5 cigs a day   Substance and Sexual Activity  . Alcohol use: No  . Drug use: No  . Sexual activity: Not Currently

## 2019-06-26 ENCOUNTER — Ambulatory Visit: Payer: Medicare Other

## 2019-07-02 ENCOUNTER — Ambulatory Visit: Payer: Medicare Other | Attending: Orthopedic Surgery | Admitting: Physical Therapy

## 2019-07-24 ENCOUNTER — Telehealth: Payer: Self-pay | Admitting: Orthopedic Surgery

## 2019-07-24 NOTE — Telephone Encounter (Signed)
Can you please help with this? I called Ms. Scott who is the patients sister. She is very concerned about his well being and wanted to know what/if anything could be done about getting him some help or an intervention. She says that he rents a single room from someone and there is mildew in the home.  She said that there were boxes and bags of trash everywhere in the home. She states it seemed that patient was eating in the bed and there was old food and trash in and around the bed. The heat is not working in the home. She said that he is also urinating in bottles and leaving them laying around in the room.  I advised not sure if there was anything that we could do but would check with you. Do you have any suggestions on how she could get him some help?   [phone]

## 2019-07-24 NOTE — Telephone Encounter (Signed)
Tried calling to see what concerns she had. No answer. No VM to LM.

## 2019-07-24 NOTE — Telephone Encounter (Signed)
Pt sister Carlisle Cater called in said she is very concerned about her brother, said he is at home living under very unhealthy conditions and would like to discuss it over with dr.dean to determine what she should do to help him.   [phone]

## 2019-07-24 NOTE — Telephone Encounter (Signed)
Patient's sister called. Missed a call from [place]. Would like for her to call back.

## 2019-07-25 NOTE — Telephone Encounter (Signed)
RNCM contacted patient's sister, Ms. Scott. She verbalized concerns regarding home conditions as well as health and safety of her brother. She indicated he is renting a room in a house and she has spoken with the landlord regarding conditions. He indicated he would be fixing some things, but it still isn't done. She states that she doesn't feel he could make himself a meal or take care of himself. Discussed that she can make a referral to [place] and discuss the need for a welfare check through [place]. She can do this anonymously if she feels she needs to. MD office can assist with ordering any DME or appropriate medications related to any ortho needs, but if she feels he is in danger, then she should reach out to DSS or contact 911 if he is a danger to himself. She agreed and states she will reach out to them and discuss options. Appreciative of call back.

## 2019-08-06 ENCOUNTER — Inpatient Hospital Stay (HOSPITAL_COMMUNITY)
Admission: EM | Admit: 2019-08-06 | Discharge: 2019-08-12 | DRG: 871 | Disposition: A | Discharge status: Home Health | Payer: Medicare Other | Attending: Family Medicine | Admitting: Family Medicine

## 2019-08-06 ENCOUNTER — Encounter (HOSPITAL_COMMUNITY): Payer: Self-pay

## 2019-08-06 ENCOUNTER — Emergency Department (HOSPITAL_COMMUNITY): Payer: Medicare Other

## 2019-08-06 ENCOUNTER — Other Ambulatory Visit: Payer: Self-pay

## 2019-08-06 DIAGNOSIS — R9431 Abnormal electrocardiogram [ECG] [EKG]: Secondary | ICD-10-CM | POA: Diagnosis not present

## 2019-08-06 DIAGNOSIS — I38 Endocarditis, valve unspecified: Secondary | ICD-10-CM | POA: Diagnosis not present

## 2019-08-06 DIAGNOSIS — J189 Pneumonia, unspecified organism: Secondary | ICD-10-CM | POA: Diagnosis present

## 2019-08-06 DIAGNOSIS — R680 Hypothermia, not associated with low environmental temperature: Secondary | ICD-10-CM | POA: Diagnosis present

## 2019-08-06 DIAGNOSIS — K029 Dental caries, unspecified: Secondary | ICD-10-CM | POA: Diagnosis present

## 2019-08-06 DIAGNOSIS — I129 Hypertensive chronic kidney disease with stage 1 through stage 4 chronic kidney disease, or unspecified chronic kidney disease: Secondary | ICD-10-CM | POA: Diagnosis present

## 2019-08-06 DIAGNOSIS — Z981 Arthrodesis status: Secondary | ICD-10-CM | POA: Diagnosis not present

## 2019-08-06 DIAGNOSIS — N181 Chronic kidney disease, stage 1: Secondary | ICD-10-CM | POA: Diagnosis present

## 2019-08-06 DIAGNOSIS — Z8349 Family history of other endocrine, nutritional and metabolic diseases: Secondary | ICD-10-CM | POA: Diagnosis not present

## 2019-08-06 DIAGNOSIS — R197 Diarrhea, unspecified: Secondary | ICD-10-CM

## 2019-08-06 DIAGNOSIS — R64 Cachexia: Secondary | ICD-10-CM | POA: Diagnosis present

## 2019-08-06 DIAGNOSIS — E43 Unspecified severe protein-calorie malnutrition: Secondary | ICD-10-CM | POA: Diagnosis present

## 2019-08-06 DIAGNOSIS — R748 Abnormal levels of other serum enzymes: Secondary | ICD-10-CM | POA: Diagnosis present

## 2019-08-06 DIAGNOSIS — J181 Lobar pneumonia, unspecified organism: Secondary | ICD-10-CM | POA: Diagnosis not present

## 2019-08-06 DIAGNOSIS — R6520 Severe sepsis without septic shock: Secondary | ICD-10-CM | POA: Diagnosis present

## 2019-08-06 DIAGNOSIS — Z800 Family history of malignant neoplasm of digestive organs: Secondary | ICD-10-CM | POA: Diagnosis not present

## 2019-08-06 DIAGNOSIS — J852 Abscess of lung without pneumonia: Secondary | ICD-10-CM | POA: Diagnosis not present

## 2019-08-06 DIAGNOSIS — M549 Dorsalgia, unspecified: Secondary | ICD-10-CM | POA: Diagnosis present

## 2019-08-06 DIAGNOSIS — Z833 Family history of diabetes mellitus: Secondary | ICD-10-CM | POA: Diagnosis not present

## 2019-08-06 DIAGNOSIS — Z681 Body mass index (BMI) 19 or less, adult: Secondary | ICD-10-CM

## 2019-08-06 DIAGNOSIS — Z720 Tobacco use: Secondary | ICD-10-CM | POA: Diagnosis present

## 2019-08-06 DIAGNOSIS — K047 Periapical abscess without sinus: Secondary | ICD-10-CM | POA: Diagnosis present

## 2019-08-06 DIAGNOSIS — Z8249 Family history of ischemic heart disease and other diseases of the circulatory system: Secondary | ICD-10-CM

## 2019-08-06 DIAGNOSIS — E876 Hypokalemia: Secondary | ICD-10-CM | POA: Diagnosis present

## 2019-08-06 DIAGNOSIS — Z79899 Other long term (current) drug therapy: Secondary | ICD-10-CM | POA: Diagnosis not present

## 2019-08-06 DIAGNOSIS — F17210 Nicotine dependence, cigarettes, uncomplicated: Secondary | ICD-10-CM | POA: Diagnosis present

## 2019-08-06 DIAGNOSIS — F418 Other specified anxiety disorders: Secondary | ICD-10-CM | POA: Diagnosis present

## 2019-08-06 DIAGNOSIS — A419 Sepsis, unspecified organism: Principal | ICD-10-CM

## 2019-08-06 DIAGNOSIS — J851 Abscess of lung with pneumonia: Secondary | ICD-10-CM | POA: Diagnosis present

## 2019-08-06 DIAGNOSIS — Z20822 Contact with and (suspected) exposure to covid-19: Secondary | ICD-10-CM | POA: Diagnosis present

## 2019-08-06 DIAGNOSIS — N179 Acute kidney failure, unspecified: Secondary | ICD-10-CM | POA: Diagnosis present

## 2019-08-06 DIAGNOSIS — D509 Iron deficiency anemia, unspecified: Secondary | ICD-10-CM | POA: Diagnosis present

## 2019-08-06 DIAGNOSIS — D649 Anemia, unspecified: Secondary | ICD-10-CM

## 2019-08-06 DIAGNOSIS — G8929 Other chronic pain: Secondary | ICD-10-CM | POA: Diagnosis present

## 2019-08-06 LAB — COMPREHENSIVE METABOLIC PANEL
ALT: <5 U/L (ref 0–44)
AST: 25 U/L (ref 15–41)
Albumin: 1.4 g/dL — ABNORMAL LOW (ref 3.5–5.0)
Alkaline Phosphatase: 193 U/L — ABNORMAL HIGH (ref 38–126)
Anion gap: 15 (ref 5–15)
BUN: 15 mg/dL (ref 6–20)
CO2: 21 mmol/L — ABNORMAL LOW (ref 22–32)
Calcium: 7.7 mg/dL — ABNORMAL LOW (ref 8.9–10.3)
Chloride: 102 mmol/L (ref 98–111)
Creatinine, Ser: 1.47 mg/dL — ABNORMAL HIGH (ref 0.61–1.24)
GFR calc Af Amer: >60 mL/min (ref >60)
GFR calc non Af Amer: 52 mL/min — ABNORMAL LOW (ref >60)
Glucose, Bld: 102 mg/dL — ABNORMAL HIGH (ref 70–99)
Potassium: 3.7 mmol/L (ref 3.5–5.1)
Sodium: 138 mmol/L (ref 135–145)
Total Bilirubin: 0.5 mg/dL (ref 0.3–1.2)
Total Protein: 6 g/dL — ABNORMAL LOW (ref 6.5–8.1)

## 2019-08-06 LAB — CBC WITH DIFFERENTIAL/PLATELET
Abs Immature Granulocytes: 0.32 10*3/uL — ABNORMAL HIGH (ref 0.00–0.07)
Basophils Absolute: 0 10*3/uL (ref 0.0–0.1)
Basophils Relative: 0 %
Eosinophils Absolute: 0 10*3/uL (ref 0.0–0.5)
Eosinophils Relative: 0 %
HCT: 21 % — ABNORMAL LOW (ref 39.0–52.0)
Hemoglobin: 6.3 g/dL — CL (ref 13.0–17.0)
Immature Granulocytes: 2 %
Lymphocytes Relative: 5 %
Lymphs Abs: 0.9 10*3/uL (ref 0.7–4.0)
MCH: 23.7 pg — ABNORMAL LOW (ref 26.0–34.0)
MCHC: 30 g/dL (ref 30.0–36.0)
MCV: 78.9 fL — ABNORMAL LOW (ref 80.0–100.0)
Monocytes Absolute: 1.5 10*3/uL — ABNORMAL HIGH (ref 0.1–1.0)
Monocytes Relative: 7 %
Neutro Abs: 17.4 10*3/uL — ABNORMAL HIGH (ref 1.7–7.7)
Neutrophils Relative %: 86 %
Platelets: 352 10*3/uL (ref 150–400)
RBC: 2.66 MIL/uL — ABNORMAL LOW (ref 4.22–5.81)
RDW: 18 % — ABNORMAL HIGH (ref 11.5–15.5)
WBC: 20.1 10*3/uL — ABNORMAL HIGH (ref 4.0–10.5)
nRBC: 0 % (ref 0.0–0.2)

## 2019-08-06 LAB — RESPIRATORY PANEL BY RT PCR (FLU A&B, COVID)
Influenza A by PCR: NEGATIVE
Influenza B by PCR: NEGATIVE
SARS Coronavirus 2 by RT PCR: NEGATIVE

## 2019-08-06 LAB — PROTIME-INR
INR: 1.4 — ABNORMAL HIGH (ref 0.8–1.2)
Prothrombin Time: 16.7 seconds — ABNORMAL HIGH (ref 11.4–15.2)

## 2019-08-06 LAB — I-STAT CHEM 8, ED
BUN: 14 mg/dL (ref 6–20)
Calcium, Ion: 1 mmol/L — ABNORMAL LOW (ref 1.15–1.40)
Chloride: 99 mmol/L (ref 98–111)
Creatinine, Ser: 1.1 mg/dL (ref 0.61–1.24)
Glucose, Bld: 190 mg/dL — ABNORMAL HIGH (ref 70–99)
HCT: 28 % — ABNORMAL LOW (ref 39.0–52.0)
Hemoglobin: 9.5 g/dL — ABNORMAL LOW (ref 13.0–17.0)
Potassium: 4.1 mmol/L (ref 3.5–5.1)
Sodium: 133 mmol/L — ABNORMAL LOW (ref 135–145)
TCO2: 13 mmol/L — ABNORMAL LOW (ref 22–32)

## 2019-08-06 LAB — APTT: aPTT: 37 seconds — ABNORMAL HIGH (ref 24–36)

## 2019-08-06 LAB — POC SARS CORONAVIRUS 2 AG -  ED: SARS Coronavirus 2 Ag: NEGATIVE

## 2019-08-06 LAB — TROPONIN I (HIGH SENSITIVITY): Troponin I (High Sensitivity): 9 ng/L (ref <18)

## 2019-08-06 LAB — POC OCCULT BLOOD, ED: Fecal Occult Bld: NEGATIVE

## 2019-08-06 LAB — CK: Total CK: 25 U/L — ABNORMAL LOW (ref 49–397)

## 2019-08-06 LAB — LACTIC ACID, PLASMA: Lactic Acid, Venous: 8.4 mmol/L (ref 0.5–1.9)

## 2019-08-06 MED ORDER — SODIUM CHLORIDE 0.9 % IV BOLUS (SEPSIS)
250.0000 mL | Freq: Once | INTRAVENOUS | Status: AC
  Administered 2019-08-06: 250 mL via INTRAVENOUS

## 2019-08-06 MED ORDER — METRONIDAZOLE IN NACL 5-0.79 MG/ML-% IV SOLN
500.0000 mg | Freq: Once | INTRAVENOUS | Status: AC
  Administered 2019-08-06: 500 mg via INTRAVENOUS
  Filled 2019-08-06: qty 100

## 2019-08-06 MED ORDER — VANCOMYCIN HCL 750 MG/150ML IV SOLN: 750.0000 mg | INTRAVENOUS | Status: DC

## 2019-08-06 MED ORDER — VANCOMYCIN HCL 1500 MG/300ML IV SOLN
1500.0000 mg | Freq: Once | INTRAVENOUS | Status: DC
  Filled 2019-08-06: qty 300

## 2019-08-06 MED ORDER — VANCOMYCIN HCL IN DEXTROSE 1-5 GM/200ML-% IV SOLN
1000.0000 mg | Freq: Once | INTRAVENOUS | Status: AC
  Administered 2019-08-06: 1000 mg via INTRAVENOUS
  Filled 2019-08-06: qty 200

## 2019-08-06 MED ORDER — SODIUM CHLORIDE 0.9 % IV BOLUS (SEPSIS)
1000.0000 mL | Freq: Once | INTRAVENOUS | Status: AC
  Administered 2019-08-06: 1000 mL via INTRAVENOUS

## 2019-08-06 MED ORDER — SODIUM CHLORIDE 0.9 % IV SOLN
2.0000 g | Freq: Once | INTRAVENOUS | Status: AC
  Administered 2019-08-06: 2 g via INTRAVENOUS
  Filled 2019-08-06: qty 2

## 2019-08-06 MED ORDER — SODIUM CHLORIDE 0.9 % IV SOLN: 2.0000 g | Freq: Two times a day (BID) | INTRAVENOUS | Status: DC

## 2019-08-06 MED ORDER — VANCOMYCIN HCL IN DEXTROSE 1-5 GM/200ML-% IV SOLN: 1000.0000 mg | Freq: Once | INTRAVENOUS | Status: DC

## 2019-08-06 MED ORDER — SODIUM CHLORIDE 0.9 % IV SOLN
10.0000 mL/h | Freq: Once | INTRAVENOUS | Status: AC
  Administered 2019-08-07: 10 mL/h via INTRAVENOUS

## 2019-08-06 NOTE — Progress Notes (Signed)
Spoke with bedside RN Gwyndolyn Saxon who informed me that pt is a very difficult stick for labs and IV team has been consulted for assistance, because of delay and difficult the Abx had to be given

## 2019-08-06 NOTE — ED Notes (Signed)
Cr Lab- HGB 6.3

## 2019-08-06 NOTE — ED Provider Notes (Signed)
[facility] EMERGENCY DEPARTMENT Provider Note   CSN: [id number] Arrival date & time: 08/06/19  1823     History Chief Complaint  Patient presents with  . Code Sepsis  . Weakness    Calvin [name] is a 59 y.o. male with a history of tobacco abuse, hypertension, anxiety, depression, & substance abuse who presents to the ED via EMS with complaints of not feeling well for the past 1 week. Patient states he has had URI sxs (nasal congestion, sore throat), cough productive of yellow to green phlegm sputum, post tussive emesis, dyspnea, chills, & generalized weakness. He started having chest pain 2 days prior, constant, worse with coughing, no alleviating factors. He has had overall poor PO intake and felt too weak to get out of bed the past few days. Lives @ home with his disabled brother. No known sick contacts with similar sxs. Denies syncope, abdominal pain, melena, hematochezia, or dysuria.   Per EMS patient cool to the touch, difficult to get SpO2, tachycardic, tachypneic. Unable to establish access.   HPI     Past Medical History:  Diagnosis Date  . Anxiety   . Arthritis    back, shoulders   . Constipation   . Depression   . Disc disorder   . Hypertension   . Lumbar herniated disc   . Muscle spasm     Patient Active Problem List   Diagnosis Date Noted  . Cervical myelopathy ([place]) 08/15/2018  . Foot laceration, right, initial encounter   . Radiculopathy 03/08/2018  . Tubular adenoma 05/16/2017  . Depressed mood 05/16/2017  . Substance abuse ([place]) 05/16/2017  . Hallux valgus 08/03/2016  . Disc disorder   . Lumbar facet arthropathy 08/06/2014  . Lumbar radiculopathy 08/06/2014  . ED (erectile dysfunction) 01/07/2014  . Unspecified vitamin D deficiency 01/07/2014  . Left shoulder pain 08/10/2013  . Chronic low back pain 08/10/2013  . Essential hypertension 08/10/2013  . Tobacco abuse 08/10/2013    Past Surgical History:  Procedure Laterality Date  .  ANTERIOR CERVICAL DECOMP/DISCECTOMY FUSION N/A 03/08/2018   Procedure: ANTERIOR CERVICAL DECOMPRESSION/DISCECTOMY FUSION , cervical 3-4, cervical 4-5, cervical 5-6 with intrumentational and allograft;  Surgeon: Phylliss Bob, MD;  Location: [facility];  Service: Orthopedics;  Laterality: N/A;  . BONE BIOPSY Right 05/17/2018   Procedure: BONE BIOPSY X2;  Surgeon: Evelina Bucy, DPM;  Location: [facility];  Service: Podiatry;  Laterality: Right;  right second toe  . COLONOSCOPY    . FOOT SURGERY     right, ~ 2002  . POLYPECTOMY         Family History  Problem Relation Age of Onset  . Diabetes Mother   . Hypertension Mother   . Heart disease Mother   . Hyperlipidemia Mother   . Diabetes Brother   . Diabetes Daughter   . Hypertension Sister   . Diabetes Sister   . Stomach cancer Maternal Grandfather   . Colon cancer Neg Hx   . Esophageal cancer Neg Hx   . Rectal cancer Neg Hx   . Colon polyps Neg Hx     Social History   Tobacco Use  . Smoking status: Current Every Day Smoker    Packs/day: 0.10    Years: 20.00    Pack years: 2.00    Types: Cigarettes  . Smokeless tobacco: Never Used  . Tobacco comment: 5 cigs a day   Substance Use Topics  . Alcohol use: No  . Drug use:  No    Home Medications Prior to Admission medications   Medication Sig Start Date End Date Taking? Authorizing Provider  amLODipine (NORVASC) 10 MG tablet Take 1 tablet (10 mg total) by mouth daily. 01/05/19   Ladell Pier, MD  cyclobenzaprine (FLEXERIL) 5 MG tablet TAKE 1 TABLET (5 MG TOTAL) BY MOUTH 2 (TWO) TIMES DAILY AS NEEDED FOR MUSCLE SPASMS. 06/08/19   Ladell Pier, MD  hydrochlorothiazide (HYDRODIURIL) 25 MG tablet Take 1 tablet (25 mg total) by mouth daily. 01/05/19   Ladell Pier, MD  HYDROcodone-acetaminophen (NORCO) 10-325 MG tablet Take 1 tablet by mouth every 8 (eight) hours as needed. 01/05/19   Ladell Pier, MD  HYDROcodone-acetaminophen (NORCO/VICODIN)  5-325 MG tablet Take 1 tablet by mouth every 8 (eight) hours as needed for moderate pain. 01/22/19   Ladell Pier, MD  Multiple Vitamin (MULTIVITAMIN) tablet Take 1 tablet by mouth daily.    [provider]  sertraline (ZOLOFT) 50 MG tablet TAKE 1 TABLET BY MOUTH DAILY. 05/07/19   Ladell Pier, MD  sildenafil (VIAGRA) 100 MG tablet 1 tab PO 1/2 hr before intercourse PRN.  Limit use to 1 tab /24 hr period. 09/21/17   Ladell Pier, MD    Allergies    Patient has no known allergies.  Review of Systems   Review of Systems  Constitutional: Positive for appetite change, chills and fatigue. Negative for fever.  HENT: Positive for congestion and sore throat.   Respiratory: Positive for cough and shortness of breath.   Cardiovascular: Positive for chest pain.  Gastrointestinal: Positive for vomiting. Negative for abdominal pain, anal bleeding, blood in stool and constipation.  Genitourinary: Negative for dysuria.  Neurological: Positive for weakness (generalized). Negative for syncope.  All other systems reviewed and are negative.   Physical Exam Updated Vital Signs BP 98/78   Pulse (!) 144   Temp (!) 95.5 F (35.3 C) (Rectal)   Resp (!) 26   Ht '6\' 1"'  (1.854 m)   Wt 45.4 kg   SpO2 98%   BMI 13.19 kg/m   Physical Exam Vitals and nursing note reviewed.  Constitutional:      Appearance: He is cachectic. He is ill-appearing.     Comments: Cool to the touch  HENT:     Head: Normocephalic and atraumatic.     Comments: No raccoon eyes/battle sign.     Nose: Nose normal.     Mouth/Throat:     Mouth: Mucous membranes are dry.  Eyes:     Pupils: Pupils are equal, round, and reactive to light.  Cardiovascular:     Rate and Rhythm: Regular rhythm. Tachycardia present.  Pulmonary:     Effort: Tachypnea present.     Comments: Course breath sounds at the bases.  SpO2 90% on RA- improved with 3L via [place].  Abdominal:     General: There is no distension.      Palpations: Abdomen is soft.     Tenderness: There is no abdominal tenderness. There is no guarding or rebound.  Musculoskeletal:     Cervical back: Neck supple. No rigidity.     Right lower leg: No edema.     Left lower leg: No edema.  Neurological:     Mental Status: He is alert and oriented to person, place, and time.    ED Results / Procedures / Treatments   Labs (all labs ordered are listed, but only abnormal results are displayed) Labs Reviewed  I-STAT CHEM  8, ED - Abnormal; Notable for the following components:      Result Value   Sodium 133 (*)    Glucose, Bld 190 (*)    Calcium, Ion 1.00 (*)    TCO2 13 (*)    Hemoglobin 9.5 (*)    HCT 28.0 (*)    All other components within normal limits  CULTURE, BLOOD (ROUTINE X 2)  CULTURE, BLOOD (ROUTINE X 2)  URINE CULTURE  LACTIC ACID, PLASMA  LACTIC ACID, PLASMA  COMPREHENSIVE METABOLIC PANEL  CBC WITH DIFFERENTIAL/PLATELET  APTT  PROTIME-INR  URINALYSIS, ROUTINE W REFLEX MICROSCOPIC  CK  POC SARS CORONAVIRUS 2 AG -  ED  TROPONIN I (HIGH SENSITIVITY)    EKG EKG Interpretation  Date/Time:  Monday August 06 2019 18:49:30 EST Ventricular Rate:  141 PR Interval:    QRS Duration: 86 QT Interval:  285 QTC Calculation: 437 R Axis:   -94 Text Interpretation: Sinus tachycardia Ventricular premature complex Probable left atrial enlargement Left anterior fascicular block Abnormal R-wave progression, late transition Nonspecific T abnormalities, lateral leads Artifact in lead(s) III aVL aVF V1 V2 V3 V4 V5 Poor data quality in current ECG precludes serial comparison Confirmed by Dorie Rank [phone]) on 08/06/2019 9:52:45 PM   Radiology DG Chest Port 1 View  Result Date: 08/06/2019 CLINICAL DATA:  59 year old male with shortness of breath EXAM: PORTABLE CHEST 1 VIEW COMPARISON:  Chest radiograph dated 12/23/2018. FINDINGS: There is a large area of opacity in the right upper lobe with apparent area of cavitation, new since the  prior radiograph. Findings may represent an infectious process such as fungal infection, abscess, or TB or bacterial pneumonia. The hilar mass is not excluded. The left lung is clear. No pleural effusion or pneumothorax. The cardiac silhouette is within normal limits. No acute osseous pathology. IMPRESSION: Large area of consolidative change in the right upper lobe as described may represent pneumonia. Underlying mass is not excluded. If there is clinical findings of pneumonia follow-up after treatment recommended to document resolution. If there is no infection, further evaluation with chest CT with IV contrast is recommended. Electronically Signed   By: Anner Crete M.D.   On: 08/06/2019 19:08    Procedures .Critical Care Performed by: Amaryllis Dyke, PA-C Authorized by: Amaryllis Dyke, PA-C     CRITICAL CARE Performed by: Kennith Maes   Total critical care time: 45 minutes  Critical care time was exclusive of separately billable procedures and treating other patients.  Critical care was necessary to treat or prevent imminent or life-threatening deterioration.  Critical care was time spent personally by me on the following activities: development of treatment plan with patient and/or surrogate as well as nursing, discussions with consultants, evaluation of patient's response to treatment, examination of patient, obtaining history from patient or surrogate, ordering and performing treatments and interventions, ordering and review of laboratory studies, ordering and review of radiographic studies, pulse oximetry and re-evaluation of patient's condition.  (including critical care time)  Medications Ordered in ED Medications  ceFEPIme (MAXIPIME) 2 g in sodium chloride 0.9 % 100 mL IVPB (has no administration in time range)  metroNIDAZOLE (FLAGYL) IVPB 500 mg (has no administration in time range)  sodium chloride 0.9 % bolus 1,000 mL (1,000 mLs Intravenous New  Bag/Given 08/06/19 1852)    And  sodium chloride 0.9 % bolus 1,000 mL (has no administration in time range)    And  sodium chloride 0.9 % bolus 250 mL (has no administration in time range)  vancomycin (VANCOREADY) IVPB 1500 mg/300 mL (has no administration in time range)    ED Course  I have reviewed the triage vital signs and the nursing notes.  Pertinent labs & imaging results that were available during my care of the patient were reviewed by me and considered in my medical decision making (see chart for details). Dallyn Riolo was evaluated in Emergency Department on 08/06/2019 for the symptoms described in the history of present illness. He/she was evaluated in the context of the global COVID-19 pandemic, which necessitated consideration that the patient might be at risk for infection with the SARS-CoV-2 virus that causes COVID-19. Institutional protocols and algorithms that pertain to the evaluation of patients at risk for COVID-19 are in a state of rapid change based on information released by regulatory bodies including the CDC and federal and state organizations. These policies and algorithms were followed during the patient's care in the ED.  MDM Rules/Calculators/A&P                      Patient presents to the ED via EMS for evaluation of not feeling well for the past 1 week with worsening dyspnea & chest pain over the past 48 hours. Patient is ill appearing, cachectic, cool to the touch, and has vitals notable for hypothermia @ 95.5 rectally, tachycardia, tachypnea, & borderline SpO2. Course breath sounds noted. Code sepsis activated, place on bair hugger, broad spectrum abx, 33 cc/kg bolus ordered.   Rapid covid negative EKG sinus tachycardia, artifact limits interpretation.  CXR: Large area of consolidative change in the right upper lobe as described may represent pneumonia. Underlying mass is not excluded. If there is clinical findings of pneumonia follow-up after treatment recommended  to document resolution. If there is no infection, further evaluation with chest CT with IV contrast is recommended  CBC: Leukocytosis @ 20.1 with left shift. Anemia w/ hgb 6.3 & hct 21.0--> DRE performed with chaperone- soft brown stool, no melena, fecal occult negative--> anemia panel ordered, transfuse 1 unit.  CMP: Mildly worsened renal function compared to prior. Hypocalcemia @ 7.7 no other significant electrolyte derangement. Hypoalbuminemia.. Alk phos elevated @ 193 Coags mildly elevated Significant lactic acidosis @ 8.4.  Troponin: 9 CK 25  --> 30 cc/kg bolus was ordered immediately on arrival based on weight in system of 70.3 kg however patient appeared thinner than this- weight obtained after fluids started 45.4 kg; will continue fluids as initially ordered given significant lactic acidosis & patient does not seem to be fluid overloaded on reassessments.   Sepsis - Repeat Assessment Performed at: 22:17 Vitals Blood pressure 103/79 , pulse 109, temperature 100 F (37.8 C), temperature source Rectal, resp. rate 10, height '6\' 1"'  (1.854 m), weight 45.4 kg, SpO2 98 %. Heart:Tachycardic Lungs:Remain couarse @ the bases Capillary Refill:<2 sec Peripheral Pulse:Radial pulse palpable Skin:Normal Color  Patient with sepsis, lactic acidosis @ 8.4, vitals improving some with fluids, suspected source pneumonia- CXR with possible underlying mass--> question malignancy with acute anemia, fecal occult negative. Will need further inpatient evaluation. Consult placed to hospitalist service.   22:55: CONSULT: Discussed with hospitliast Dr. Marlowe Sax- accepts admission.   Findings and plan of care discussed with supervising physician Dr. Tomi Bamberger who has evaluated the patient & is in agreement.   Final Clinical Impression(s) / ED Diagnoses Final diagnoses:  Severe sepsis ([place])  Anemia, unspecified type    Rx / DC Orders ED Discharge Orders    None       Lavert Matousek,  Dorris Carnes 08/06/19  2311    Dorie Rank, MD 08/07/19 [phone]

## 2019-08-06 NOTE — ED Triage Notes (Signed)
Pt from home with ems for generalized weakness for a while now, pt not eating or drinking at home, FTT. Pt emaciated, coughing up yellow sputum. Hypothermic, tachycardic

## 2019-08-06 NOTE — ED Notes (Signed)
bair hugger applied.

## 2019-08-06 NOTE — ED Notes (Signed)
Patient nephew calling asking for an update  Please call when we can Calvin [name] [phone]

## 2019-08-06 NOTE — Progress Notes (Signed)
Pharmacy Antibiotic Note  Calvin [name] is a 59 y.o. male admitted on 08/06/2019 with infection from unknown source .  Pharmacy has been consulted for cefepime and vancomycin dosing.  Plan: Cefepime 2 gm IV q12hr Vancomycin 1000 mg IV load x1 dose; then start vancomycin 750 mg IV q24hr Monitor clinical improvement, renal function and obtain vancomycin levels as needed   Temp (24hrs), Avg:95.5 F (35.3 C), Min:95.5 F (35.3 C), Max:95.5 F (35.3 C)  Recent Labs  Lab 08/06/19 1848  CREATININE 1.10    Estimated Creatinine Clearance: 47 mL/min (by C-G formula based on SCr of 1.1 mg/dL).    No Known Allergies  Antimicrobials this admission: 1/11 Vancomycin >>  1/11 Cefepime >>  1/11 metronidazole x1 dose   Dose adjustments this admission: none   Microbiology results: 1/11 BCx:  1/11 UCx:   Thank you for allowing pharmacy to be a part of this patient's care.  Acey Lav, PharmD  PGY1 Acute Care Pharmacy Resident [phone] 08/06/2019 7:15 PM

## 2019-08-06 NOTE — ED Notes (Signed)
Portable xray at bedside.

## 2019-08-06 NOTE — ED Notes (Signed)
Gave critical lactic to provider

## 2019-08-07 ENCOUNTER — Inpatient Hospital Stay (HOSPITAL_COMMUNITY): Payer: Medicare Other

## 2019-08-07 DIAGNOSIS — J181 Lobar pneumonia, unspecified organism: Secondary | ICD-10-CM

## 2019-08-07 DIAGNOSIS — I38 Endocarditis, valve unspecified: Secondary | ICD-10-CM

## 2019-08-07 DIAGNOSIS — J852 Abscess of lung without pneumonia: Secondary | ICD-10-CM

## 2019-08-07 DIAGNOSIS — D649 Anemia, unspecified: Secondary | ICD-10-CM

## 2019-08-07 DIAGNOSIS — A419 Sepsis, unspecified organism: Secondary | ICD-10-CM

## 2019-08-07 DIAGNOSIS — R197 Diarrhea, unspecified: Secondary | ICD-10-CM

## 2019-08-07 DIAGNOSIS — R9431 Abnormal electrocardiogram [ECG] [EKG]: Secondary | ICD-10-CM

## 2019-08-07 LAB — COMPREHENSIVE METABOLIC PANEL
ALT: 18 U/L (ref 0–44)
AST: 19 U/L (ref 15–41)
Albumin: 1.3 g/dL — ABNORMAL LOW (ref 3.5–5.0)
Alkaline Phosphatase: 166 U/L — ABNORMAL HIGH (ref 38–126)
Anion gap: 12 (ref 5–15)
BUN: 14 mg/dL (ref 6–20)
CO2: 22 mmol/L (ref 22–32)
Calcium: 7.2 mg/dL — ABNORMAL LOW (ref 8.9–10.3)
Chloride: 100 mmol/L (ref 98–111)
Creatinine, Ser: 1.15 mg/dL (ref 0.61–1.24)
GFR calc Af Amer: >60 mL/min (ref >60)
GFR calc non Af Amer: >60 mL/min (ref >60)
Glucose, Bld: 107 mg/dL — ABNORMAL HIGH (ref 70–99)
Potassium: 3.3 mmol/L — ABNORMAL LOW (ref 3.5–5.1)
Sodium: 134 mmol/L — ABNORMAL LOW (ref 135–145)
Total Bilirubin: 0.5 mg/dL (ref 0.3–1.2)
Total Protein: 5.2 g/dL — ABNORMAL LOW (ref 6.5–8.1)

## 2019-08-07 LAB — ECHOCARDIOGRAM COMPLETE
Height: 73 in
Weight: 1600 oz

## 2019-08-07 LAB — C-REACTIVE PROTEIN: CRP: 19.5 mg/dL — ABNORMAL HIGH (ref <1.0)

## 2019-08-07 LAB — GAMMA GT: GGT: 38 U/L (ref 7–50)

## 2019-08-07 LAB — PREPARE RBC (CROSSMATCH)

## 2019-08-07 LAB — CBC WITH DIFFERENTIAL/PLATELET
Abs Immature Granulocytes: 0.14 10*3/uL — ABNORMAL HIGH (ref 0.00–0.07)
Basophils Absolute: 0 10*3/uL (ref 0.0–0.1)
Basophils Relative: 0 %
Eosinophils Absolute: 0 10*3/uL (ref 0.0–0.5)
Eosinophils Relative: 0 %
HCT: 26.3 % — ABNORMAL LOW (ref 39.0–52.0)
Hemoglobin: 8.2 g/dL — ABNORMAL LOW (ref 13.0–17.0)
Immature Granulocytes: 1 %
Lymphocytes Relative: 15 %
Lymphs Abs: 1.9 10*3/uL (ref 0.7–4.0)
MCH: 25.6 pg — ABNORMAL LOW (ref 26.0–34.0)
MCHC: 31.2 g/dL (ref 30.0–36.0)
MCV: 82.2 fL (ref 80.0–100.0)
Monocytes Absolute: 1.3 10*3/uL — ABNORMAL HIGH (ref 0.1–1.0)
Monocytes Relative: 10 %
Neutro Abs: 9.5 10*3/uL — ABNORMAL HIGH (ref 1.7–7.7)
Neutrophils Relative %: 74 %
Platelets: 333 10*3/uL (ref 150–400)
RBC: 3.2 MIL/uL — ABNORMAL LOW (ref 4.22–5.81)
RDW: 18.7 % — ABNORMAL HIGH (ref 11.5–15.5)
WBC: 12.8 10*3/uL — ABNORMAL HIGH (ref 4.0–10.5)
nRBC: 0.2 % (ref 0.0–0.2)

## 2019-08-07 LAB — HIV ANTIBODY (ROUTINE TESTING W REFLEX): HIV Screen 4th Generation wRfx: NONREACTIVE

## 2019-08-07 LAB — RETICULOCYTES
Immature Retic Fract: 20.5 % — ABNORMAL HIGH (ref 2.3–15.9)
RBC.: 2.42 MIL/uL — ABNORMAL LOW (ref 4.22–5.81)
Retic Count, Absolute: 45.3 10*3/uL (ref 19.0–186.0)
Retic Ct Pct: 1.9 % (ref 0.4–3.1)

## 2019-08-07 LAB — URINALYSIS, ROUTINE W REFLEX MICROSCOPIC
Bilirubin Urine: NEGATIVE
Glucose, UA: NEGATIVE mg/dL
Hgb urine dipstick: NEGATIVE
Ketones, ur: NEGATIVE mg/dL
Leukocytes,Ua: NEGATIVE
Nitrite: NEGATIVE
Protein, ur: 30 mg/dL — AB
Specific Gravity, Urine: 1.02 (ref 1.005–1.030)
pH: 5 (ref 5.0–8.0)

## 2019-08-07 LAB — IRON AND TIBC
Iron: <5 ug/dL — ABNORMAL LOW (ref 45–182)
TIBC: 98 ug/dL — ABNORMAL LOW (ref 250–450)

## 2019-08-07 LAB — EXPECTORATED SPUTUM ASSESSMENT W GRAM STAIN, RFLX TO RESP C

## 2019-08-07 LAB — VITAMIN B12: Vitamin B-12: 1184 pg/mL — ABNORMAL HIGH (ref 180–914)

## 2019-08-07 LAB — FERRITIN: Ferritin: 377 ng/mL — ABNORMAL HIGH (ref 24–336)

## 2019-08-07 LAB — TROPONIN I (HIGH SENSITIVITY): Troponin I (High Sensitivity): 11 ng/L (ref <18)

## 2019-08-07 LAB — LACTIC ACID, PLASMA
Lactic Acid, Venous: 3.8 mmol/L (ref 0.5–1.9)
Lactic Acid, Venous: 4.7 mmol/L (ref 0.5–1.9)

## 2019-08-07 LAB — FOLATE: Folate: 13.4 ng/mL (ref >5.9)

## 2019-08-07 MED ORDER — GUAIFENESIN-DM 100-10 MG/5ML PO SYRP
5.0000 mL | ORAL_SOLUTION | ORAL | Status: DC | PRN
  Administered 2019-08-08 – 2019-08-09 (×4): 5 mL via ORAL
  Filled 2019-08-07 (×5): qty 5

## 2019-08-07 MED ORDER — SODIUM CHLORIDE 0.9% IV SOLUTION: Freq: Once | INTRAVENOUS | Status: AC

## 2019-08-07 MED ORDER — SODIUM CHLORIDE 0.9 % IV SOLN
3.0000 g | Freq: Three times a day (TID) | INTRAVENOUS | Status: DC
  Administered 2019-08-07 – 2019-08-11 (×13): 3 g via INTRAVENOUS
  Filled 2019-08-07: qty 8
  Filled 2019-08-07: qty 3
  Filled 2019-08-07 (×4): qty 8
  Filled 2019-08-07: qty 3
  Filled 2019-08-07 (×7): qty 8
  Filled 2019-08-07 (×2): qty 3
  Filled 2019-08-07 (×2): qty 8
  Filled 2019-08-07: qty 3

## 2019-08-07 MED ORDER — NICOTINE 7 MG/24HR TD PT24
7.0000 mg | MEDICATED_PATCH | Freq: Every day | TRANSDERMAL | Status: DC
  Administered 2019-08-07 – 2019-08-12 (×6): 7 mg via TRANSDERMAL
  Filled 2019-08-07 (×8): qty 1

## 2019-08-07 MED ORDER — IOHEXOL 300 MG/ML  SOLN
75.0000 mL | Freq: Once | INTRAMUSCULAR | Status: AC | PRN
  Administered 2019-08-07: 75 mL via INTRAVENOUS

## 2019-08-07 MED ORDER — TRAZODONE HCL 50 MG PO TABS
50.0000 mg | ORAL_TABLET | Freq: Every day | ORAL | Status: AC
  Administered 2019-08-08: 50 mg via ORAL
  Filled 2019-08-07: qty 1

## 2019-08-07 MED ORDER — SODIUM CHLORIDE 0.9 % IV BOLUS
1000.0000 mL | Freq: Once | INTRAVENOUS | Status: AC
  Administered 2019-08-07: 1000 mL via INTRAVENOUS

## 2019-08-07 NOTE — ED Notes (Addendum)
RN stopped blood b/c "air in tubing message".  Two different RNs inspected the tubing and it appeared to have clots in that the blood would not move through the tubing.  RN called blood bank who agreed w/ stopping the blood.  Returned blood to blood bank for their inspection.  RN notified provider who ordered another unit.  Which will be given

## 2019-08-07 NOTE — ED Notes (Signed)
Echo at bedside

## 2019-08-07 NOTE — Progress Notes (Signed)
  Echocardiogram 2D Echocardiogram has been performed.  Jennette Dubin 08/07/2019, 4:49 PM

## 2019-08-07 NOTE — ED Notes (Signed)
Dr. Sharlet Salina notified of patient's multiple request for something to help him sleep, no new orders received at this time.

## 2019-08-07 NOTE — ED Notes (Signed)
ED TO INPATIENT HANDOFF REPORT  ED Nurse Name and Phone #: William Hamburger RN  [phone]  S Name/Age/Gender Calvin [name] 59 y.o. male Room/Bed: 008C/008C  Code Status   Code Status: Full Code  Home/SNF/Other Home Patient oriented to: self, place, time and situation Is this baseline? Yes   Triage Complete: Triage complete  Chief Complaint CAP (community acquired pneumonia) [J18.9]  Triage Note Pt from home with ems for generalized weakness for a while now, pt not eating or drinking at home, FTT. Pt emaciated, coughing up yellow sputum. Hypothermic, tachycardic    Allergies No Known Allergies  Level of Care/Admitting Diagnosis ED Disposition    ED Disposition Condition [facility] Area: [place] [[id number]]  Level of Care: Progressive [102]  Admit to Progressive based on following criteria: MULTISYSTEM THREATS such as stable sepsis, metabolic/electrolyte imbalance with or without encephalopathy that is responding to early treatment.  Covid Evaluation: Confirmed COVID Negative  Diagnosis: CAP (community acquired pneumonia) [102725]  Admitting Physician: Shela Leff [[id number]]  Attending Physician: Shela Leff [[id number]]  Estimated length of stay: past midnight tomorrow  Certification:: I certify this patient will need inpatient services for at least 2 midnights       B Medical/Surgery History Past Medical History:  Diagnosis Date  . Anxiety   . Arthritis    back, shoulders   . Constipation   . Depression   . Disc disorder   . Hypertension   . Lumbar herniated disc   . Muscle spasm    Past Surgical History:  Procedure Laterality Date  . ANTERIOR CERVICAL DECOMP/DISCECTOMY FUSION N/A 03/08/2018   Procedure: ANTERIOR CERVICAL DECOMPRESSION/DISCECTOMY FUSION , cervical 3-4, cervical 4-5, cervical 5-6 with intrumentational and allograft;  Surgeon: Phylliss Bob, MD;  Location: [facility];  Service: Orthopedics;  Laterality: N/A;  . BONE  BIOPSY Right 05/17/2018   Procedure: BONE BIOPSY X2;  Surgeon: Evelina Bucy, DPM;  Location: [facility];  Service: Podiatry;  Laterality: Right;  right second toe  . COLONOSCOPY    . FOOT SURGERY     right, ~ 2002  . POLYPECTOMY       A IV Location/Drains/Wounds Patient Lines/Drains/Airways Status   Active Line/Drains/Airways    Name:   Placement date:   Placement time:   Site:   Days:   Peripheral IV 08/06/19 Left Forearm   08/06/19    1844    Forearm   1   Peripheral IV 08/06/19 Medial;Left Forearm   08/06/19    2103    Forearm   1   Peripheral IV 08/06/19 Right;Medial Forearm   08/06/19    2121    Forearm   1   Incision (Closed) 03/08/18 Neck Other (Comment)   03/08/18    1723     517   Incision (Closed) 05/17/18 Foot Right   05/17/18    1440     447          Intake/Output Last 24 hours  Intake/Output Summary (Last 24 hours) at 08/07/2019 2044 Last data filed at 08/07/2019 1416 Gross per 24 hour  Intake 2193 ml  Output 125 ml  Net 2068 ml    Labs/Imaging Results for orders placed or performed during the hospital encounter of 08/06/19 (from the past 48 hour(s))  Comprehensive metabolic panel     Status: Abnormal   Collection Time: 08/06/19  6:31 PM  Result Value Ref Range   Sodium 138 135 - 145 mmol/L  Potassium 3.7 3.5 - 5.1 mmol/L   Chloride 102 98 - 111 mmol/L   CO2 21 (L) 22 - 32 mmol/L   Glucose, Bld 102 (H) 70 - 99 mg/dL   BUN 15 6 - 20 mg/dL   Creatinine, Ser 1.47 (H) 0.61 - 1.24 mg/dL   Calcium 7.7 (L) 8.9 - 10.3 mg/dL   Total Protein 6.0 (L) 6.5 - 8.1 g/dL   Albumin 1.4 (L) 3.5 - 5.0 g/dL   AST 25 15 - 41 U/L   ALT <5 0 - 44 U/L    Comment: REPEATED TO VERIFY   Alkaline Phosphatase 193 (H) 38 - 126 U/L   Total Bilirubin 0.5 0.3 - 1.2 mg/dL   GFR calc non Af Amer 52 (L) >60 mL/min   GFR calc Af Amer >60 >60 mL/min   Anion gap 15 5 - 15    Comment: Performed at [facility], [address] [place], [place] [address]  CBC WITH  DIFFERENTIAL     Status: Abnormal   Collection Time: 08/06/19  6:31 PM  Result Value Ref Range   WBC 20.1 (H) 4.0 - 10.5 K/uL   RBC 2.66 (L) 4.22 - 5.81 MIL/uL   Hemoglobin 6.3 (LL) 13.0 - 17.0 g/dL    Comment: REPEATED TO VERIFY THIS CRITICAL RESULT HAS VERIFIED AND BEEN CALLED TO RN STEPHANIE YOUNG BY MESSAN HOUEGNIFIO ON 01 11 2021 AT 2127, AND HAS BEEN READ BACK.     HCT 21.0 (L) 39.0 - 52.0 %   MCV 78.9 (L) 80.0 - 100.0 fL   MCH 23.7 (L) 26.0 - 34.0 pg   MCHC 30.0 30.0 - 36.0 g/dL   RDW 18.0 (H) 11.5 - 15.5 %   Platelets 352 150 - 400 K/uL   nRBC 0.0 0.0 - 0.2 %   Neutrophils Relative % 86 %   Neutro Abs 17.4 (H) 1.7 - 7.7 K/uL   Lymphocytes Relative 5 %   Lymphs Abs 0.9 0.7 - 4.0 K/uL   Monocytes Relative 7 %   Monocytes Absolute 1.5 (H) 0.1 - 1.0 K/uL   Eosinophils Relative 0 %   Eosinophils Absolute 0.0 0.0 - 0.5 K/uL   Basophils Relative 0 %   Basophils Absolute 0.0 0.0 - 0.1 K/uL   Immature Granulocytes 2 %   Abs Immature Granulocytes 0.32 (H) 0.00 - 0.07 K/uL    Comment: Performed at [facility] [address] [place], [place] [address]  APTT     Status: Abnormal   Collection Time: 08/06/19  6:31 PM  Result Value Ref Range   aPTT 37 (H) 24 - 36 seconds    Comment:        IF BASELINE aPTT IS ELEVATED, SUGGEST PATIENT RISK ASSESSMENT BE USED TO DETERMINE APPROPRIATE ANTICOAGULANT THERAPY. Performed at [facility], [place] [address], [place], [place] [address]   Protime-INR     Status: Abnormal   Collection Time: 08/06/19  6:31 PM  Result Value Ref Range   Prothrombin Time 16.7 (H) 11.4 - 15.2 seconds   INR 1.4 (H) 0.8 - 1.2    Comment: (NOTE) INR goal varies based on device and disease states. Performed at [facility], [place] [address], [place], [place] [address]   Troponin I (High Sensitivity)     Status: None   Collection Time: 08/06/19  6:31 PM  Result Value Ref Range   Troponin I (High Sensitivity) 9 <18 ng/L    Comment:  (  NOTE) Elevated high sensitivity troponin I (hsTnI) values and significant  changes across serial measurements may suggest ACS but many other  chronic and acute conditions are known to elevate hsTnI results.  Refer to the Links section for chest pain algorithms and additional  guidance. Performed at [facility], [place] [address] [place], [place] [address]   CK     Status: Abnormal   Collection Time: 08/06/19  6:31 PM  Result Value Ref Range   Total CK 25 (L) 49 - 397 U/L    Comment: Performed at [facility], [place] [address], [place], [place] [address]  I-stat chem 8, ED (not at [facility] or [facility])     Status: Abnormal   Collection Time: 08/06/19  6:48 PM  Result Value Ref Range   Sodium 133 (L) 135 - 145 mmol/L   Potassium 4.1 3.5 - 5.1 mmol/L   Chloride 99 98 - 111 mmol/L   BUN 14 6 - 20 mg/dL   Creatinine, Ser 1.10 0.61 - 1.24 mg/dL   Glucose, Bld 190 (H) 70 - 99 mg/dL   Calcium, Ion 1.00 (L) 1.15 - 1.40 mmol/L   TCO2 13 (L) 22 - 32 mmol/L   Hemoglobin 9.5 (L) 13.0 - 17.0 g/dL   HCT 28.0 (L) 39.0 - 52.0 %  POC SARS Coronavirus 2 Ag-ED - Nasal Swab (BD Veritor Kit)     Status: None   Collection Time: 08/06/19  7:42 PM  Result Value Ref Range   SARS Coronavirus 2 Ag NEGATIVE NEGATIVE    Comment: (NOTE) SARS-CoV-2 antigen NOT DETECTED.  Negative results are presumptive.  Negative results do not preclude SARS-CoV-2 infection and should not be used as the sole basis for treatment or other patient management decisions, including infection  control decisions, particularly in the presence of clinical signs and  symptoms consistent with COVID-19, or in those who have been in contact with the virus.  Negative results must be combined with clinical observations, patient history, and epidemiological information. The expected result is Negative. Fact Sheet for Patients: PodPark.tn Fact Sheet for Healthcare  Providers: GiftContent.is This test is not yet approved or cleared by the [place] FDA and  has been authorized for detection and/or diagnosis of SARS-CoV-2 by FDA under an Emergency Use Authorization (EUA).  This EUA will remain in effect (meaning this test can be used) for the duration of  the COVID-19 de claration under Section 564(b)(1) of the Act, 21 U.S.C. section 360bbb-3(b)(1), unless the authorization is terminated or revoked sooner.   Lactic acid, plasma     Status: Abnormal   Collection Time: 08/06/19  8:45 PM  Result Value Ref Range   Lactic Acid, Venous 8.4 (HH) 0.5 - 1.9 mmol/L    Comment: CRITICAL RESULT CALLED TO, READ BACK BY AND VERIFIED WITH: Lubertha Sayres  2120 08/06/2019 WBOND Performed at [facility], [place] [address] [place], [place] [address]   Blood Culture (routine x 2)     Status: None (Preliminary result)   Collection Time: 08/06/19  9:18 PM   Specimen: BLOOD LEFT FOREARM  Result Value Ref Range   Specimen Description BLOOD LEFT FOREARM    Special Requests      BOTTLES DRAWN AEROBIC AND ANAEROBIC Blood Culture results may not be optimal due to an inadequate volume of blood received in culture bottles   Culture      NO GROWTH < 24 HOURS Performed at [facility], [place] [address],  [place], [place] [address]    Report Status PENDING   Respiratory Panel by RT PCR (Flu A&B, Covid) - Nasopharyngeal Swab     Status: None   Collection Time: 08/06/19  9:40 PM   Specimen: Nasopharyngeal Swab  Result Value Ref Range   SARS Coronavirus 2 by RT PCR NEGATIVE NEGATIVE    Comment: (NOTE) SARS-CoV-2 target nucleic acids are NOT DETECTED. The SARS-CoV-2 RNA is generally detectable in upper respiratoy specimens during the acute phase of infection. The lowest concentration of SARS-CoV-2 viral copies this assay can detect is 131 copies/mL. A negative result does not preclude SARS-Cov-2 infection and should not be used as the  sole basis for treatment or other patient management decisions. A negative result may occur with  improper specimen collection/handling, submission of specimen other than nasopharyngeal swab, presence of viral mutation(s) within the areas targeted by this assay, and inadequate number of viral copies (<131 copies/mL). A negative result must be combined with clinical observations, patient history, and epidemiological information. The expected result is Negative. Fact Sheet for Patients:  PinkCheek.be Fact Sheet for Healthcare Providers:  GravelBags.it This test is not yet ap proved or cleared by the [place] FDA and  has been authorized for detection and/or diagnosis of SARS-CoV-2 by FDA under an Emergency Use Authorization (EUA). This EUA will remain  in effect (meaning this test can be used) for the duration of the COVID-19 declaration under Section 564(b)(1) of the Act, 21 U.S.C. section 360bbb-3(b)(1), unless the authorization is terminated or revoked sooner.    Influenza A by PCR NEGATIVE NEGATIVE   Influenza B by PCR NEGATIVE NEGATIVE    Comment: (NOTE) The Xpert Xpress SARS-CoV-2/FLU/RSV assay is intended as an aid in  the diagnosis of influenza from Nasopharyngeal swab specimens and  should not be used as a sole basis for treatment. Nasal washings and  aspirates are unacceptable for Xpert Xpress SARS-CoV-2/FLU/RSV  testing. Fact Sheet for Patients: PinkCheek.be Fact Sheet for Healthcare Providers: GravelBags.it This test is not yet approved or cleared by the [place] FDA and  has been authorized for detection and/or diagnosis of SARS-CoV-2 by  FDA under an Emergency Use Authorization (EUA). This EUA will remain  in effect (meaning this test can be used) for the duration of the  Covid-19 declaration under Section 564(b)(1) of the Act, 21  U.S.C. section  360bbb-3(b)(1), unless the authorization is  terminated or revoked. Performed at [facility], [place] [address], [place],  [address]   POC occult blood, ED     Status: None   Collection Time: 08/06/19 10:05 PM  Result Value Ref Range   Fecal Occult Bld NEGATIVE NEGATIVE  Troponin I (High Sensitivity)     Status: None   Collection Time: 08/06/19 11:55 PM  Result Value Ref Range   Troponin I (High Sensitivity) 11 <18 ng/L    Comment: (NOTE) Elevated high sensitivity troponin I (hsTnI) values and significant  changes across serial measurements may suggest ACS but many other  chronic and acute conditions are known to elevate hsTnI results.  Refer to the "Links" section for chest pain algorithms and additional  guidance. Performed at [facility], [place] [address], [place], [place] [address]   Lactic acid, plasma     Status: Abnormal   Collection Time: 08/06/19 11:58 PM  Result Value Ref Range   Lactic Acid, Venous 4.7 (HH) 0.5 - 1.9 mmol/L    Comment: CRITICAL VALUE NOTED.  VALUE IS CONSISTENT WITH PREVIOUSLY  REPORTED AND CALLED VALUE. Performed at [facility], [place] [address] [place], [place] [address]   HIV Antibody (routine testing w rflx)     Status: None   Collection Time: 08/06/19 11:58 PM  Result Value Ref Range   HIV Screen 4th Generation wRfx NON REACTIVE NON REACTIVE    Comment: Performed at [facility], [place] [address] [place], [place] [address]  Vitamin B12     Status: Abnormal   Collection Time: 08/06/19 11:58 PM  Result Value Ref Range   Vitamin B-12 1,184 (H) 180 - 914 pg/mL    Comment: Performed at [facility], [place] [address] [place], [place] [address]  Folate     Status: None   Collection Time: 08/06/19 11:58 PM  Result Value Ref Range   Folate 13.4 >5.9 ng/mL    Comment: Performed at [facility], [place] [address], [place], [place] [address]  Iron and TIBC     Status: Abnormal   Collection Time: 08/06/19 11:58 PM   Result Value Ref Range   Iron <5 (L) 45 - 182 ug/dL   TIBC 98 (L) 250 - 450 ug/dL   Saturation Ratios NOT CALCULATED 17.9 - 39.5 %   UIBC NOT CALCULATED ug/dL    Comment: Performed at [facility] [address] [place], [place] [address]  Ferritin     Status: Abnormal   Collection Time: 08/06/19 11:58 PM  Result Value Ref Range   Ferritin 377 (H) 24 - 336 ng/mL    Comment: Performed at [facility], [place] [address], [place], [place] [address]  Reticulocytes     Status: Abnormal   Collection Time: 08/06/19 11:58 PM  Result Value Ref Range   Retic Ct Pct 1.9 0.4 - 3.1 %   RBC. 2.42 (L) 4.22 - 5.81 MIL/uL   Retic Count, Absolute 45.3 19.0 - 186.0 K/uL   Immature Retic Fract 20.5 (H) 2.3 - 15.9 %    Comment: Performed at [facility] [address] [place], [place] [address]  Type and screen [place]     Status: None (Preliminary result)   Collection Time: 08/06/19 11:58 PM  Result Value Ref Range   ABO/RH(D) A POS    Antibody Screen NEG    Sample Expiration 08/09/2019,2359    Unit Number [id number]    Blood Component Type RBC, LR IRR    Unit division 00    Status of Unit ISSUED    Transfusion Status OK TO TRANSFUSE    Crossmatch Result      Compatible Performed at [facility], [place] [address] [place], [place] [address]    Unit Number [id number]    Blood Component Type RED CELLS,LR    Unit division 00    Status of Unit ISSUED    Transfusion Status OK TO TRANSFUSE    Crossmatch Result Compatible    Unit Number [id number]    Blood Component Type RED CELLS,LR    Unit division 00    Status of Unit ALLOCATED    Transfusion Status OK TO TRANSFUSE    Crossmatch Result Compatible   Prepare RBC     Status: None   Collection Time: 08/06/19 11:58 PM  Result Value Ref Range   Order Confirmation      ORDER PROCESSED BY BLOOD BANK Performed at [facility], [place] [address] [place],  [address]   Lactic acid,  plasma     Status: Abnormal   Collection Time: 08/07/19  3:10 AM  Result Value Ref Range   Lactic Acid, Venous 3.8 (HH) 0.5 - 1.9 mmol/L    Comment: CRITICAL VALUE NOTED.  VALUE IS CONSISTENT WITH PREVIOUSLY REPORTED AND CALLED VALUE. Performed at [facility], [place] [address] [place], [place] [address]   Gamma GT     Status: None   Collection Time: 08/07/19  3:13 AM  Result Value Ref Range   GGT 38 7 - 50 U/L    Comment: Performed at [facility], [place] [address] [place], [place] [address]  C-reactive protein     Status: Abnormal   Collection Time: 08/07/19  3:13 AM  Result Value Ref Range   CRP 19.5 (H) <1.0 mg/dL    Comment: Performed at [facility] [address] [place], [place] [address]  Urinalysis, Routine w reflex microscopic     Status: Abnormal   Collection Time: 08/07/19  4:38 AM  Result Value Ref Range   Color, Urine YELLOW YELLOW   APPearance HAZY (A) CLEAR   Specific Gravity, Urine 1.020 1.005 - 1.030   pH 5.0 5.0 - 8.0   Glucose, UA NEGATIVE NEGATIVE mg/dL   Hgb urine dipstick NEGATIVE NEGATIVE   Bilirubin Urine NEGATIVE NEGATIVE   Ketones, ur NEGATIVE NEGATIVE mg/dL   Protein, ur 30 (A) NEGATIVE mg/dL   Nitrite NEGATIVE NEGATIVE   Leukocytes,Ua NEGATIVE NEGATIVE   RBC / HPF 0-5 0 - 5 RBC/hpf   WBC, UA 0-5 0 - 5 WBC/hpf   Bacteria, UA RARE (A) NONE SEEN   Hyaline Casts, UA PRESENT     Comment: Performed at [facility] [address] [place], [place] [address]  Expectorated sputum assessment w rflx to resp cult     Status: None   Collection Time: 08/07/19  4:39 AM   Specimen: Expectorated Sputum  Result Value Ref Range   Specimen Description SPUTUM    Special Requests NONE    Sputum evaluation      THIS SPECIMEN IS ACCEPTABLE FOR SPUTUM CULTURE Performed at [facility], [place] [address], [place], [place] [address]    Report Status 08/07/2019 FINAL   Culture, respiratory     Status: None (Preliminary result)   Collection  Time: 08/07/19  4:39 AM   Specimen: SPU  Result Value Ref Range   Specimen Description SPUTUM    Special Requests NONE Reflexed from [id number]    Gram Stain      FEW WBC PRESENT,BOTH PMN AND MONONUCLEAR RARE BUDDING YEAST SEEN RARE GRAM VARIABLE ROD Performed at [facility], [place] [address], [place], [place] [address]    Culture PENDING    Report Status PENDING   Prepare RBC     Status: None   Collection Time: 08/07/19  5:30 AM  Result Value Ref Range   Order Confirmation      ORDER PROCESSED BY BLOOD BANK Performed at [facility], [place] [address] [place], [place] [address]   CBC with Differential     Status: Abnormal   Collection Time: 08/07/19  8:32 AM  Result Value Ref Range   WBC 12.8 (H) 4.0 - 10.5 K/uL   RBC 3.20 (L) 4.22 - 5.81 MIL/uL   Hemoglobin 8.2 (L) 13.0 - 17.0 g/dL    Comment: REPEATED TO VERIFY POST TRANSFUSION SPECIMEN    HCT 26.3 (L) 39.0 - 52.0 %  MCV 82.2 80.0 - 100.0 fL   MCH 25.6 (L) 26.0 - 34.0 pg   MCHC 31.2 30.0 - 36.0 g/dL   RDW 18.7 (H) 11.5 - 15.5 %   Platelets 333 150 - 400 K/uL   nRBC 0.2 0.0 - 0.2 %   Neutrophils Relative % 74 %   Neutro Abs 9.5 (H) 1.7 - 7.7 K/uL   Lymphocytes Relative 15 %   Lymphs Abs 1.9 0.7 - 4.0 K/uL   Monocytes Relative 10 %   Monocytes Absolute 1.3 (H) 0.1 - 1.0 K/uL   Eosinophils Relative 0 %   Eosinophils Absolute 0.0 0.0 - 0.5 K/uL   Basophils Relative 0 %   Basophils Absolute 0.0 0.0 - 0.1 K/uL   Immature Granulocytes 1 %   Abs Immature Granulocytes 0.14 (H) 0.00 - 0.07 K/uL    Comment: Performed at [facility] [address], [place], [place] [address]  Comprehensive metabolic panel     Status: Abnormal   Collection Time: 08/07/19  8:32 AM  Result Value Ref Range   Sodium 134 (L) 135 - 145 mmol/L   Potassium 3.3 (L) 3.5 - 5.1 mmol/L   Chloride 100 98 - 111 mmol/L   CO2 22 22 - 32 mmol/L   Glucose, Bld 107 (H) 70 - 99 mg/dL   BUN 14 6 - 20 mg/dL   Creatinine, Ser 1.15 0.61 - 1.24  mg/dL   Calcium 7.2 (L) 8.9 - 10.3 mg/dL   Total Protein 5.2 (L) 6.5 - 8.1 g/dL   Albumin 1.3 (L) 3.5 - 5.0 g/dL   AST 19 15 - 41 U/L   ALT 18 0 - 44 U/L   Alkaline Phosphatase 166 (H) 38 - 126 U/L   Total Bilirubin 0.5 0.3 - 1.2 mg/dL   GFR calc non Af Amer >60 >60 mL/min   GFR calc Af Amer >60 >60 mL/min   Anion gap 12 5 - 15    Comment: Performed at [facility], [place] [address] [place], [place] [address]   CT CHEST W CONTRAST  Result Date: 08/07/2019 CLINICAL DATA:  Cavitary lung lesion, generalized weakness and productive cough EXAM: CT CHEST WITH CONTRAST TECHNIQUE: Multidetector CT imaging of the chest was performed during intravenous contrast administration. CONTRAST:  28m OMNIPAQUE IOHEXOL 300 MG/ML  SOLN COMPARISON:  Radiograph same day FINDINGS: Cardiovascular: There are no significant vascular findings. The heart size is normal. There is no pericardial thickening or effusion. Mediastinum/Nodes: There are no enlarged mediastinal, hilar or axillary lymph nodes. The thyroid gland, trachea and esophagus demonstrate no significant findings. Lungs/Pleura: There is a large multilocular air and fluid-filled masslike collection within the right upper lobe which extends into the upper paratracheal soft tissues. There is thick peripheral enhancement seen in this multilocular collection with air-fluid levels. A probable centrally necrotic subcarinal mass/lymph node is seen best on, series 3, image 77 measuring approximately 2.9 cm. There is a spiculated area of consolidation extending into the right middle lobe with pleural thickening. Small a cystic lesion with adjacent pleural thickening also noted in the posterior left upper lung. Upper abdomen:  Ascites seen within the upper abdomen. Musculoskeletal/Chest wall: There is no chest wall mass or suspicious osseous finding. No acute osseous abnormality IMPRESSION: 1. Large multilocular cystic air and fluid collection/consolidation within the  right upper lung extending into the right middle lobe. There is also a small cystic cavitary lesion with adjacent pleural thickening in the posterior left upper lobe. These findings  could be suggestive of multifocal cavitary lung abscesses due to bacterial or fungal infection(such as TB). However cannot exclude underlying bronchogenic cavitary malignancy. 2.  centrally necrotic subcarinal adenopathy measuring 2.9 cm. 3. Small amount of abdominal ascites. Electronically Signed   By: Prudencio Pair M.D.   On: 08/07/2019 04:46   DG Chest Port 1 View  Result Date: 08/06/2019 CLINICAL DATA:  59 year old male with shortness of breath EXAM: PORTABLE CHEST 1 VIEW COMPARISON:  Chest radiograph dated 12/23/2018. FINDINGS: There is a large area of opacity in the right upper lobe with apparent area of cavitation, new since the prior radiograph. Findings may represent an infectious process such as fungal infection, abscess, or TB or bacterial pneumonia. The hilar mass is not excluded. The left lung is clear. No pleural effusion or pneumothorax. The cardiac silhouette is within normal limits. No acute osseous pathology. IMPRESSION: Large area of consolidative change in the right upper lobe as described may represent pneumonia. Underlying mass is not excluded. If there is clinical findings of pneumonia follow-up after treatment recommended to document resolution. If there is no infection, further evaluation with chest CT with IV contrast is recommended. Electronically Signed   By: Anner Crete M.D.   On: 08/06/2019 19:08   ECHOCARDIOGRAM COMPLETE  Result Date: 08/07/2019   ECHOCARDIOGRAM REPORT   Patient Name:   Calvin [name]  Date of Exam: 08/07/2019 Medical Rec #:  [id number]  Height:       73.0 in Accession #:    [id number] Weight:       100.0 lb Date of Birth:  09-16-1960 BSA:          1.60 m Patient Age:    13 years   BP:           112/81 mmHg Patient Gender: M          HR:           92 bpm. Exam Location:  Inpatient  Procedure: 2D Echo Indications:    Lung Infection; Endocarditis I38; Abnormal ECG R94.31  History:        Patient has no prior history of Echocardiogram examinations.                 Risk Factors:Hypertension.  Sonographer:    Mikki Santee RDCS (AE) Referring Phys: [place]  1. Left ventricular ejection fraction, by visual estimation, is 60 to 65%. The left ventricle has normal function. There is no left ventricular hypertrophy.  2. The left ventricle has no regional wall motion abnormalities.  3. Global right ventricle has normal systolic function.The right ventricular size is normal. No increase in right ventricular wall thickness.  4. Left atrial size was normal.  5. Right atrial size was normal.  6. The mitral valve is normal in structure. No evidence of mitral valve regurgitation. No evidence of mitral stenosis.  7. The tricuspid valve is normal in structure.  8. The septal leaflet appears thickened, but no clear vegetation is seen.  9. The aortic valve is normal in structure. Aortic valve regurgitation is not visualized. No evidence of aortic valve sclerosis or stenosis. 10. The pulmonic valve was normal in structure. Pulmonic valve regurgitation is not visualized. 11. Normal pulmonary artery systolic pressure. 12. The inferior vena cava is normal in size with greater than 50% respiratory variability, suggesting right atrial pressure of 3 mmHg. 13. No definite vegetations are seen, although the septal leaflet of the tricuspid valve appears abnormally thickened. Consider TEE if  endocarditis is suspected and TEE findings would change clinical management. FINDINGS  Left Ventricle: Left ventricular ejection fraction, by visual estimation, is 60 to 65%. The left ventricle has normal function. The left ventricle has no regional wall motion abnormalities. There is no left ventricular hypertrophy. Left ventricular diastolic parameters were normal. Normal left atrial pressure. Right  Ventricle: The right ventricular size is normal. No increase in right ventricular wall thickness. Global RV systolic function is has normal systolic function. The tricuspid regurgitant velocity is 1.93 m/s, and with an assumed right atrial pressure  of 3 mmHg, the estimated right ventricular systolic pressure is normal at 17.9 mmHg. Left Atrium: Left atrial size was normal in size. Right Atrium: Right atrial size was normal in size Pericardium: There is no evidence of pericardial effusion. Mitral Valve: The mitral valve is normal in structure. No evidence of mitral valve regurgitation. No evidence of mitral valve stenosis by observation. Tricuspid Valve: The tricuspid valve is normal in structure. Tricuspid valve regurgitation is trivial. The septal leaflet appears thickened, but no clear vegetation is seen. Aortic Valve: The aortic valve is normal in structure. Aortic valve regurgitation is not visualized. The aortic valve is structurally normal, with no evidence of sclerosis or stenosis. Pulmonic Valve: The pulmonic valve was normal in structure. Pulmonic valve regurgitation is not visualized. Pulmonic regurgitation is not visualized. Aorta: The aortic root, ascending aorta and aortic arch are all structurally normal, with no evidence of dilitation or obstruction. Venous: The inferior vena cava is normal in size with greater than 50% respiratory variability, suggesting right atrial pressure of 3 mmHg. IAS/Shunts: No atrial level shunt detected by color flow Doppler. There is no evidence of a patent foramen ovale. No ventricular septal defect is seen or detected. There is no evidence of an atrial septal defect. Additional Comments: No definite vegetations are seen, although the septal leaflet of the tricuspid valve appears abnormally thickened. Consider TEE if endocarditis is suspected and TEE findings would change clinical management.  LEFT VENTRICLE PLAX 2D LVIDd:         4.40 cm  Diastology LVIDs:         3.00  cm  LV e' lateral:   11.60 cm/s LV PW:         1.00 cm  LV E/e' lateral: 7.9 LV IVS:        0.90 cm  LV e' medial:    9.46 cm/s LVOT diam:     2.30 cm  LV E/e' medial:  9.6 LV SV:         53 ml LV SV Index:   35.21 LVOT Area:     4.15 cm  RIGHT VENTRICLE RV S prime:     12.90 cm/s TAPSE (M-mode): 1.7 cm LEFT ATRIUM           Index       RIGHT ATRIUM           Index LA diam:      2.40 cm 1.50 cm/m  RA Area:     11.70 cm LA Vol (A4C): 31.0 ml 19.34 ml/m RA Volume:   24.20 ml  15.10 ml/m  AORTIC VALVE LVOT Vmax:   85.80 cm/s LVOT Vmean:  52.700 cm/s LVOT VTI:    0.137 m  AORTA Ao Root diam: 3.00 cm MITRAL VALVE                        TRICUSPID VALVE MV Area (PHT): 2.54 cm  TR Peak grad:   14.9 mmHg MV PHT:        86.71 msec           TR Vmax:        193.00 cm/s MV Decel Time: 299 msec MV E velocity: 91.20 cm/s 103 cm/s  SHUNTS MV A velocity: 69.20 cm/s 70.3 cm/s Systemic VTI:  0.14 m MV E/A ratio:  1.32       1.5       Systemic Diam: 2.30 cm  Dani Gobble Croitoru MD Electronically signed by Sanda Klein MD Signature Date/Time: 08/07/2019/6:14:39 PM    Final     Pending Labs Unresulted Labs (From admission, onward)    Start     Ordered   08/07/19 0354  GI pathogen panel by PCR, stool  (Gastrointestinal Panel by PCR, Stool                                                                                                                                                     *Does Not include CLOSTRIDIUM DIFFICILE testing.**If CDIFF testing is needed, select the C Difficile Quick Screen w PCR reflex order below)  Once,   STAT     08/07/19 0353   08/07/19 0353  C difficile quick scan w PCR reflex  (C Difficile quick screen w PCR reflex panel)  Once, for 24 hours,   STAT     08/07/19 0353   08/07/19 0313  QuantiFERON-TB Gold Plus  ONCE - STAT,   STAT     08/07/19 0314   08/06/19 1831  Blood Culture (routine x 2)  BLOOD CULTURE X 2,   STAT     08/06/19 1832   08/06/19 1831  Urine culture  ONCE - STAT,    STAT     08/06/19 1832          Vitals/Pain Today's Vitals   08/07/19 1624 08/07/19 1630 08/07/19 1700 08/07/19 1730  BP:  115/80 116/80 104/79  Pulse:   85 87  Resp:  (!) '22 16 17  ' Temp:      TempSrc:      SpO2:   100% 100%  Weight:      Height:      PainSc: Asleep       Isolation Precautions Enteric precautions (UV disinfection)  Medications Medications  nicotine (NICODERM CQ - dosed in mg/24 hr) patch 7 mg (7 mg Transdermal Patch Applied 08/07/19 1102)  Ampicillin-Sulbactam (UNASYN) 3 g in sodium chloride 0.9 % 100 mL IVPB (0 g Intravenous Stopped 08/07/19 1416)  guaiFENesin-dextromethorphan (ROBITUSSIN DM) 100-10 MG/5ML syrup 5 mL (has no administration in time range)  0.9 %  sodium chloride infusion (Manually program via Guardrails IV Fluids) (has no administration in time range)  ceFEPIme (MAXIPIME) 2 g in sodium chloride 0.9 % 100 mL IVPB (0 g Intravenous  Stopped 08/06/19 2005)  metroNIDAZOLE (FLAGYL) IVPB 500 mg (0 mg Intravenous Stopped 08/06/19 2117)  sodium chloride 0.9 % bolus 1,000 mL (0 mLs Intravenous Stopped 08/07/19 0115)    And  sodium chloride 0.9 % bolus 1,000 mL (0 mLs Intravenous Stopped 08/07/19 0115)    And  sodium chloride 0.9 % bolus 250 mL (0 mLs Intravenous Stopped 08/06/19 2306)  vancomycin (VANCOCIN) IVPB 1000 mg/200 mL premix (0 mg Intravenous Stopped 08/06/19 2242)  0.9 %  sodium chloride infusion (10 mL/hr Intravenous New Bag/Given 08/07/19 1602)  sodium chloride 0.9 % bolus 1,000 mL (0 mLs Intravenous Stopped 08/07/19 0834)  iohexol (OMNIPAQUE) 300 MG/ML solution 75 mL (75 mLs Intravenous Contrast Given 08/07/19 0404)    Mobility walks High fall risk   Focused Assessments Pulmonary Assessment Handoff:  Lung sounds:   O2 Device: Room Air O2 Flow Rate (L/min): 3 L/min      R Recommendations: See Admitting Provider Note  Report given to:   Additional Notes:

## 2019-08-07 NOTE — Progress Notes (Signed)
30 known history anxiety depression tobacco abuse HTN chronic back pain with myelopathy status post procedure 02/2018, possible osteomyelitis right toe in the past followed by ID, 7 mm polyp 2018 on scope Admitted with 3-week history of shortness of breath cough-year-old black male unintentional 20 pound weight loss  Found to have sepsis on arrival to emergency room T-max 95.5 WBC 20 lactic acid 8 Also found to have microcytic anemia hemoglobin 6.3 MCV 78--iron levels 5 saturation not calculated ferritin 377  CT of chest shows large multilocular cystic air-fluid collection within the right upper lung into the right middle lobe and small cystic cavitary lesion?  Multifocal cavitary lung abscess due to bacterial fungal infection or TB  HIV negative, C. difficile GI pathogen panel pending, fecal occult negative and he has had brown stool throughout the time that he has been ill over the past 3 weeks  Patient transfused 2 units PRBC  O/e Blood pressure 112/83, pulse 80, temperature 98.9 F (37.2 C), temperature source Oral, resp. rate 14, height 6\' 1"  (1.854 m), weight 45.4 kg, SpO2 98 %.  Asthenic appearing black male no distress No submandibular or axillary lymphadenopathy right upper molar see full of caries CTA B no added sound no rales no rhonchi cannot appreciate fremitus or resonance S1-S2 slightly tachycardic Abdomen soft  We will continue empiric antibiotics at this time-I have consulted CVTS to see the patient as I doubt that bronchoscopy will resolve all of his issues and he may need definitive surgical management of the same He is stable at this time and can go to telemetry  Until TB is ruled out it would be advisable to use N95  Verneita Griffes, MD [facility] 9:22 AM

## 2019-08-07 NOTE — ED Notes (Signed)
Dinner Tray Ordered @ 1807.  

## 2019-08-07 NOTE — H&P (Signed)
History and Physical    Calvin [name] TJL:[id number] DOB: 1961/02/19 DOA: 08/06/2019  PCP: Ladell Pier, MD Patient coming from: Home  Chief Complaint: Generalized weakness, anorexia, cough  HPI: Calvin [name] is a 59 y.o. male with medical history significant of anxiety, depression, tobacco use, arthritis, hypertension, chronic back pain presenting to the ED via EMS for evaluation of generalized weakness, anorexia, and a cough.  Patient states symptoms started 3 weeks ago.  He is having cough productive of brown-colored sputum.  Recently for the past few days he has started noticing some blood mixed with the sputum.  He is slightly short of breath and having generalized weakness/fatigue.  He experiences chest pain after coughing aggressively.  He is having fevers at home and has unintentionally lost 20 pounds in the past 3 weeks.  He does not have an appetite.  No nausea or vomiting.  He is also having nonbloody diarrhea for the past 3 weeks and sometimes experiences slight lower abdominal pain.  Denies any recent sick exposures or history of TB.  He lives with his brother who is not sick.  ED Course: Hypothermic with temperature 95.5 F on arrival, external warming initiated.  Blood pressure low with systolic in the 01T.  Tachycardic. WBC count 20.1 with left shift.  Lactic acid 8.4.  Hemoglobin 6.3 and MCV 78.9.  FOBT negative.  Hemoglobin was 12.2 on labs done in October 2019.  Creatinine 1.4, baseline 1.0-1.2.  Alk phos 193, remainder of LFTs not elevated.  High-sensitivity troponin x2 negative.  CK not elevated.  UA and urine culture pending.  SARS-CoV-2 rapid antigen and PCR test negative.  Blood culture x2 pending.  Chest x-ray showing a large area of consolidative change in the right upper lobe with apparent area of cavitation, new since prior radiograph.  Findings concerning for fungal infection, abscess, TB, bacterial pneumonia, or possible underlying hilar mass.  HIV antibody nonreactive.   Patient received vancomycin, cefepime, metronidazole, and 2.5 L normal saline boluses.  Creatinine improved to 1.1 after IV fluid resuscitation and lactic acid improved to 4.7.  1 unit PRBCs ordered.  Review of Systems:  All systems reviewed and apart from history of presenting illness, are negative.  Past Medical History:  Diagnosis Date  . Anxiety   . Arthritis    back, shoulders   . Constipation   . Depression   . Disc disorder   . Hypertension   . Lumbar herniated disc   . Muscle spasm     Past Surgical History:  Procedure Laterality Date  . ANTERIOR CERVICAL DECOMP/DISCECTOMY FUSION N/A 03/08/2018   Procedure: ANTERIOR CERVICAL DECOMPRESSION/DISCECTOMY FUSION , cervical 3-4, cervical 4-5, cervical 5-6 with intrumentational and allograft;  Surgeon: Phylliss Bob, MD;  Location: [place];  Service: Orthopedics;  Laterality: N/A;  . BONE BIOPSY Right 05/17/2018   Procedure: BONE BIOPSY X2;  Surgeon: Evelina Bucy, DPM;  Location: [place];  Service: Podiatry;  Laterality: Right;  right second toe  . COLONOSCOPY    . FOOT SURGERY     right, ~ 2002  . POLYPECTOMY       reports that he has been smoking cigarettes. He has a 2.00 pack-year smoking history. He has never used smokeless tobacco. He reports that he does not drink alcohol or use drugs.  No Known Allergies  Family History  Problem Relation Age of Onset  . Diabetes Mother   . Hypertension Mother   . Heart disease Mother   . Hyperlipidemia Mother   .  Diabetes Brother   . Diabetes Daughter   . Hypertension Sister   . Diabetes Sister   . Stomach cancer Maternal Grandfather   . Colon cancer Neg Hx   . Esophageal cancer Neg Hx   . Rectal cancer Neg Hx   . Colon polyps Neg Hx     Prior to Admission medications   Medication Sig Start Date End Date Taking? Authorizing Provider  amLODipine (NORVASC) 10 MG tablet Take 1 tablet (10 mg total) by mouth daily. 01/05/19   Ladell Pier, MD   cyclobenzaprine (FLEXERIL) 5 MG tablet TAKE 1 TABLET (5 MG TOTAL) BY MOUTH 2 (TWO) TIMES DAILY AS NEEDED FOR MUSCLE SPASMS. 06/08/19   Ladell Pier, MD  hydrochlorothiazide (HYDRODIURIL) 25 MG tablet Take 1 tablet (25 mg total) by mouth daily. 01/05/19   Ladell Pier, MD  HYDROcodone-acetaminophen (NORCO) 10-325 MG tablet Take 1 tablet by mouth every 8 (eight) hours as needed. 01/05/19   Ladell Pier, MD  HYDROcodone-acetaminophen (NORCO/VICODIN) 5-325 MG tablet Take 1 tablet by mouth every 8 (eight) hours as needed for moderate pain. 01/22/19   Ladell Pier, MD  Multiple Vitamin (MULTIVITAMIN) tablet Take 1 tablet by mouth daily.    [provider]  sertraline (ZOLOFT) 50 MG tablet TAKE 1 TABLET BY MOUTH DAILY. 05/07/19   Ladell Pier, MD  sildenafil (VIAGRA) 100 MG tablet 1 tab PO 1/2 hr before intercourse PRN.  Limit use to 1 tab /24 hr period. 09/21/17   Ladell Pier, MD    Physical Exam: Vitals:   08/07/19 0230 08/07/19 0245 08/07/19 0300 08/07/19 0315  BP: 105/76 101/74 106/69 102/75  Pulse: 94 95 92 90  Resp: '15 15 13 18  ' Temp:      TempSrc:      SpO2: 99% 98% 100% 99%  Weight:      Height:        Physical Exam  Constitutional: He is oriented to person, place, and time. No distress.  Cachectic Appears fatigued  HENT:  Head: Normocephalic.  Eyes: Right eye exhibits no discharge. Left eye exhibits no discharge.  Cardiovascular: Normal rate, regular rhythm and intact distal pulses.  Pulmonary/Chest: Effort normal. No respiratory distress. He has no wheezes. He has no rales.  Slightly coarse breath sounds in the right upper lung field  Abdominal: Soft. Bowel sounds are normal. He exhibits no distension. There is no abdominal tenderness. There is no guarding.  Musculoskeletal:        General: No edema.     Cervical back: Neck supple.  Neurological: He is alert and oriented to person, place, and time.  Skin: Skin is warm and dry. He is  not diaphoretic.     Labs on Admission: I have personally reviewed following labs and imaging studies  CBC: Recent Labs  Lab 08/06/19 1831 08/06/19 1848  WBC 20.1*  --   NEUTROABS 17.4*  --   HGB 6.3* 9.5*  HCT 21.0* 28.0*  MCV 78.9*  --   PLT 352  --    Basic Metabolic Panel: Recent Labs  Lab 08/06/19 1831 08/06/19 1848  NA 138 133*  K 3.7 4.1  CL 102 99  CO2 21*  --   GLUCOSE 102* 190*  BUN 15 14  CREATININE 1.47* 1.10  CALCIUM 7.7*  --    GFR: Estimated Creatinine Clearance: 47 mL/min (by C-G formula based on SCr of 1.1 mg/dL). Liver Function Tests: Recent Labs  Lab 08/06/19 1831  AST  25  ALT <5  ALKPHOS 193*  BILITOT 0.5  PROT 6.0*  ALBUMIN 1.4*   No results for input(s): LIPASE, AMYLASE in the last 168 hours. No results for input(s): AMMONIA in the last 168 hours. Coagulation Profile: Recent Labs  Lab 08/06/19 1831  INR 1.4*   Cardiac Enzymes: Recent Labs  Lab 08/06/19 1831  CKTOTAL 25*   BNP (last 3 results) No results for input(s): PROBNP in the last 8760 hours. HbA1C: No results for input(s): HGBA1C in the last 72 hours. CBG: No results for input(s): GLUCAP in the last 168 hours. Lipid Profile: No results for input(s): CHOL, HDL, LDLCALC, TRIG, CHOLHDL, LDLDIRECT in the last 72 hours. Thyroid Function Tests: No results for input(s): TSH, T4TOTAL, FREET4, T3FREE, THYROIDAB in the last 72 hours. Anemia Panel: Recent Labs    08/06/19 2358  VITAMINB12 1,184*  FOLATE 13.4  FERRITIN 377*  TIBC 98*  IRON <5*  RETICCTPCT 1.9   Urine analysis:    Component Value Date/Time   COLORURINE AMBER (A) 04/29/2014 2031   APPEARANCEUR CLEAR 04/29/2014 2031   LABSPEC 1.019 04/29/2014 2031   PHURINE 5.0 04/29/2014 2031   GLUCOSEU NEGATIVE 04/29/2014 2031   HGBUR NEGATIVE 04/29/2014 2031   BILIRUBINUR NEGATIVE 04/29/2014 2031   KETONESUR NEGATIVE 04/29/2014 2031   PROTEINUR 30 (A) 04/29/2014 2031   UROBILINOGEN 0.2 04/29/2014 2031    NITRITE NEGATIVE 04/29/2014 2031   LEUKOCYTESUR NEGATIVE 04/29/2014 2031    Radiological Exams on Admission: DG Chest Port 1 View  Result Date: 08/06/2019 CLINICAL DATA:  59 year old male with shortness of breath EXAM: PORTABLE CHEST 1 VIEW COMPARISON:  Chest radiograph dated 12/23/2018. FINDINGS: There is a large area of opacity in the right upper lobe with apparent area of cavitation, new since the prior radiograph. Findings may represent an infectious process such as fungal infection, abscess, or TB or bacterial pneumonia. The hilar mass is not excluded. The left lung is clear. No pleural effusion or pneumothorax. The cardiac silhouette is within normal limits. No acute osseous pathology. IMPRESSION: Large area of consolidative change in the right upper lobe as described may represent pneumonia. Underlying mass is not excluded. If there is clinical findings of pneumonia follow-up after treatment recommended to document resolution. If there is no infection, further evaluation with chest CT with IV contrast is recommended. Electronically Signed   By: Anner Crete M.D.   On: 08/06/2019 19:08    EKG: Independently reviewed.  Sinus tachycardia, heart rate 141.  Q waves in inferior leads, leads V3-5.  Poor quality study with artifact in multiple leads.  Assessment/Plan Principal Problem:   Lobar pneumonia (HCC) Active Problems:   Tobacco abuse   Severe sepsis (HCC)   Diarrhea   Anemia   Severe sepsis secondary to right upper lobe pneumonia, suspected cavitary lung lesion/ possible lung abscess Patient is presenting with a 3-week history of fatigue, generalized weakness, anorexia, unintentional 20 pound weight loss, cough/hemoptysis for the past few days, and dyspnea.  Hypothermic with temperature 95.5 F upon arrival and external warming was initiated.  Body temperature has now improved.  Blood pressure low with systolic in the 53I and tachycardic.  Blood pressure has now improved and  tachycardia resolved after IV fluid resuscitation.  Currently satting well on room air. WBC count 20.1 with left shift.  Lactic acid 8.4 >4.7 with IV fluid boluses. Chest x-ray personally reviewed showing a large area of consolidative change in the right upper lobe with apparent area of cavitation, new since prior  radiograph.  Findings concerning for fungal infection, abscess, TB, bacterial pneumonia, or possible underlying hilar mass.  HIV antibody nonreactive.  SARS-CoV-2 rapid antigen and PCR test negative.  -Chest CT with contrast for further evaluation.  Consider ID and pulmonology consultation in a.m. based on CT results. -Continue IV fluid resuscitation and trend lactate -Continue antibiotic coverage with Unasyn -Antitussive as needed -Blood culture x2 pending -Check CRP level -Sputum Gram stain and culture -QuantiFERON gold test to rule out TB -Continue airborne precautions -Continuous pulse ox, supplemental oxygen as needed to keep oxygen saturation above 92%  Diarrhea Patient reports 3-week history of nonbloody diarrhea and slight lower abdominal discomfort.  Abdominal exam benign.  Does have significant leukocytosis. -C. difficile PCR and GI pathogen panel  Microcytic anemia Hemoglobin 6.3 and MCV 78.9.  FOBT negative.  Hemoglobin was 12.2 on labs done in October 2019.  No active hemoptysis since patient has been in the ED.  Anemia panel showing normal folate and B12.  Iron level low, TIBC low, and ferritin normal. -Type and screen -1 unit PRBCs ordered -Posttransfusion H&H  AKI Creatinine 1.4, baseline 1.0-1.2.  Creatinine improved to 1.1 after IV fluid resuscitation. -Continue to monitor renal function and urine output  Tobacco use -NicoDerm patch -Counseling  Elevated alkaline phosphatase Alk phos 193, remainder of LFTs not elevated.   -Check GGT level  Pharmacy med rec pending.  DVT prophylaxis: SCDs at this time.  May need biopsy or further invasive work-up based  on chest CT results. Code Status: Full code Family Communication: No family at bedside. Disposition Plan: Anticipate discharge after clinical improvement. Consults called: None Admission status: It is my clinical opinion that admission to INPATIENT is reasonable and necessary in this 59 y.o. male . presenting with severe sepsis secondary to right upper lobe pneumonia, suspected cavitary lung lesion/possible lung abscess.  Very high risk of decompensation.  Given the aforementioned, the predictability of an adverse outcome is felt to be significant. I expect that the patient will require at least 2 midnights in the hospital to treat this condition.   The medical decision making on this patient was of high complexity and the patient is at high risk for clinical deterioration, therefore this is a level 3 visit.  Shela Leff MD [facility] Pager [phone]  If 7PM-7AM, please contact night-coverage [email or web address] Password [email or web address]  08/07/2019, 4:00 AM

## 2019-08-07 NOTE — ED Notes (Signed)
Pt declined to have anyone called at this time

## 2019-08-07 NOTE — Consult Note (Addendum)
Calvin [name]       [place] [address]             [phone]        [name] [facility] Medical Record [id number] Date of Birth: 1960-12-09  Referring: No ref. provider found Primary Care: Ladell Pier, MD Primary Cardiologist:No primary care provider on file.  Chief Complaint:    Chief Complaint  Patient presents with  . Code Sepsis  . Weakness    History of Present Illness:      The patient is a 59 year old male with medical history significant for anxiety, depression, hypertension, tobacco use, arthritis, and chronic back pain who presented to the emergency department for evaluation of generalized weakness, anorexia, and cough.  Per the patient his symptoms started about 3 weeks ago.  His cough is productive with brown-colored sputum.  Over the past few days, he has had some blood mixed with sputum.  He is having fevers at home and has had an unintentional weight loss of around 20 pounds in the last few weeks.  He is nauseated but no recent vomiting.  He denies any recent sick exposures or history of tuberculosis.  He was hypothermic on arrival to the ED and external warming was initiated.  He had low blood pressures and was tachycardic.  He did have an elevated white count of 20,000 with left shift.  He was anemic with a hemoglobin of 6.3.  1 unit of packed red blood cells were ordered.  Blood cultures, urine cultures, and rapid COVID-19 tests are currently pending.  We are consulted for possible surgical intervention for his right upper lobe pneumonia and suspected cavitary lung lesion for possible lung abscess.  Current Activity/ Functional Status: in bed for several weeks at home.    Zubrod Score: At the time of surgery this patient's most appropriate activity status/level should be described as: []     0    Normal activity, no symptoms []     1    Restricted in physical strenuous activity but ambulatory, able to do out light work []     2     Ambulatory and capable of self care, unable to do work activities, up and about                 more than 50%  Of the time                            []     3    Only limited self care, in bed greater than 50% of waking hours [x]     4    Completely disabled, no self care, confined to bed or chair []     5    Moribund  Past Medical History:  Diagnosis Date  . Anxiety   . Arthritis    back, shoulders   . Constipation   . Depression   . Disc disorder   . Hypertension   . Lumbar herniated disc   . Muscle spasm     Past Surgical History:  Procedure Laterality Date  . ANTERIOR CERVICAL DECOMP/DISCECTOMY FUSION N/A 03/08/2018   Procedure: ANTERIOR CERVICAL DECOMPRESSION/DISCECTOMY FUSION , cervical 3-4, cervical 4-5, cervical 5-6 with intrumentational and allograft;  Surgeon: Phylliss Bob, MD;  Location: [facility];  Service: Orthopedics;  Laterality: N/A;  . BONE BIOPSY Right 05/17/2018   Procedure: BONE BIOPSY X2;  Surgeon: Evelina Bucy, DPM;  Location: [facility];  Service: Podiatry;  Laterality: Right;  right second toe  . COLONOSCOPY    . FOOT SURGERY     right, ~ 2002  . POLYPECTOMY      Social History   Tobacco Use  Smoking Status Current Every Day Smoker  . Packs/day: 0.10  . Years: 20.00  . Pack years: 2.00  . Types: Cigarettes  Smokeless Tobacco Never Used  Tobacco Comment   5 cigs a day     Social History   Substance and Sexual Activity  Alcohol Use No     No Known Allergies  Current Facility-Administered Medications  Medication Dose Route Frequency Provider Last Rate Last Admin  . 0.9 %  sodium chloride infusion (Manually program via Guardrails IV Fluids)   Intravenous Once Shela Leff, MD      . 0.9 %  sodium chloride infusion  10 mL/hr Intravenous Once Shela Leff, MD      . Ampicillin-Sulbactam (UNASYN) 3 g in sodium chloride 0.9 % 100 mL IVPB  3 g Intravenous Q8H Karren Cobble, RPH   Stopped at 08/07/19 0559  .  guaiFENesin-dextromethorphan (ROBITUSSIN DM) 100-10 MG/5ML syrup 5 mL  5 mL Oral Q4H PRN Shela Leff, MD      . nicotine (NICODERM CQ - dosed in mg/24 hr) patch 7 mg  7 mg Transdermal Daily Shela Leff, MD       Current Outpatient Medications  Medication Sig Dispense Refill  . amLODipine (NORVASC) 10 MG tablet Take 1 tablet (10 mg total) by mouth daily. 90 tablet 3  . cyclobenzaprine (FLEXERIL) 5 MG tablet TAKE 1 TABLET (5 MG TOTAL) BY MOUTH 2 (TWO) TIMES DAILY AS NEEDED FOR MUSCLE SPASMS. 30 tablet 0  . hydrochlorothiazide (HYDRODIURIL) 25 MG tablet Take 1 tablet (25 mg total) by mouth daily. 90 tablet 3  . sertraline (ZOLOFT) 50 MG tablet TAKE 1 TABLET BY MOUTH DAILY. 30 tablet 2  . HYDROcodone-acetaminophen (NORCO) 10-325 MG tablet Take 1 tablet by mouth every 8 (eight) hours as needed. (Patient not taking: Reported on 08/07/2019) 30 tablet 0  . HYDROcodone-acetaminophen (NORCO/VICODIN) 5-325 MG tablet Take 1 tablet by mouth every 8 (eight) hours as needed for moderate pain. (Patient not taking: Reported on 08/07/2019) 30 tablet 0  . Multiple Vitamin (MULTIVITAMIN) tablet Take 1 tablet by mouth daily.    . sildenafil (VIAGRA) 100 MG tablet 1 tab PO 1/2 hr before intercourse PRN.  Limit use to 1 tab /24 hr period. 30 tablet 3    (Not in a hospital admission)   Family History  Problem Relation Age of Onset  . Diabetes Mother   . Hypertension Mother   . Heart disease Mother   . Hyperlipidemia Mother   . Diabetes Brother   . Diabetes Daughter   . Hypertension Sister   . Diabetes Sister   . Stomach cancer Maternal Grandfather   . Colon cancer Neg Hx   . Esophageal cancer Neg Hx   . Rectal cancer Neg Hx   . Colon polyps Neg Hx      Review of Systems:   Review of Systems  Constitutional: Positive for chills, malaise/fatigue and weight loss. Negative for fever (hypothermia).  HENT: Negative.   Respiratory: Positive for cough, hemoptysis, sputum production and shortness  of breath.   Cardiovascular: Negative for leg swelling.  Gastrointestinal: Positive for diarrhea and nausea. Negative for vomiting.  Musculoskeletal: Positive for myalgias.  Neurological: Positive for weakness.  Psychiatric/Behavioral: Positive for substance  abuse (tobacco).   Pertinent items are noted in HPI.   Physical Exam: BP 108/76   Pulse 89   Temp 98.9 F (37.2 C) (Oral)   Resp 20   Ht 6\' 1"  (1.854 m)   Wt 45.4 kg   SpO2 97%   BMI 13.19 kg/m    General appearance: cooperative and chonically ill appearing, severe malnutrition Resp: clear to auscultation bilaterally Cardio: regular rate and rhythm, S1, S2 normal, no murmur, click, rub or gallop GI: soft, non-tender; bowel sounds normal; no masses,  no organomegaly Extremities: very thin with obvious muscle loss, no edema Neurologic: Grossly normal  Diagnostic Studies & Laboratory data:     Recent Radiology Findings:   CT CHEST W CONTRAST  Result Date: 08/07/2019 CLINICAL DATA:  Cavitary lung lesion, generalized weakness and productive cough EXAM: CT CHEST WITH CONTRAST TECHNIQUE: Multidetector CT imaging of the chest was performed during intravenous contrast administration. CONTRAST:  58mL OMNIPAQUE IOHEXOL 300 MG/ML  SOLN COMPARISON:  Radiograph same day FINDINGS: Cardiovascular: There are no significant vascular findings. The heart size is normal. There is no pericardial thickening or effusion. Mediastinum/Nodes: There are no enlarged mediastinal, hilar or axillary lymph nodes. The thyroid gland, trachea and esophagus demonstrate no significant findings. Lungs/Pleura: There is a large multilocular air and fluid-filled masslike collection within the right upper lobe which extends into the upper paratracheal soft tissues. There is thick peripheral enhancement seen in this multilocular collection with air-fluid levels. A probable centrally necrotic subcarinal mass/lymph node is seen best on, series 3, image 77 measuring  approximately 2.9 cm. There is a spiculated area of consolidation extending into the right middle lobe with pleural thickening. Small a cystic lesion with adjacent pleural thickening also noted in the posterior left upper lung. Upper abdomen:  Ascites seen within the upper abdomen. Musculoskeletal/Chest wall: There is no chest wall mass or suspicious osseous finding. No acute osseous abnormality IMPRESSION: 1. Large multilocular cystic air and fluid collection/consolidation within the right upper lung extending into the right middle lobe. There is also a small cystic cavitary lesion with adjacent pleural thickening in the posterior left upper lobe. These findings could be suggestive of multifocal cavitary lung abscesses due to bacterial or fungal infection(such as TB). However cannot exclude underlying bronchogenic cavitary malignancy. 2.  centrally necrotic subcarinal adenopathy measuring 2.9 cm. 3. Small amount of abdominal ascites. Electronically Signed   By: Prudencio Pair M.D.   On: 08/07/2019 04:46   DG Chest Port 1 View  Result Date: 08/06/2019 CLINICAL DATA:  59 year old male with shortness of breath EXAM: PORTABLE CHEST 1 VIEW COMPARISON:  Chest radiograph dated 12/23/2018. FINDINGS: There is a large area of opacity in the right upper lobe with apparent area of cavitation, new since the prior radiograph. Findings may represent an infectious process such as fungal infection, abscess, or TB or bacterial pneumonia. The hilar mass is not excluded. The left lung is clear. No pleural effusion or pneumothorax. The cardiac silhouette is within normal limits. No acute osseous pathology. IMPRESSION: Large area of consolidative change in the right upper lobe as described may represent pneumonia. Underlying mass is not excluded. If there is clinical findings of pneumonia follow-up after treatment recommended to document resolution. If there is no infection, further evaluation with chest CT with IV contrast is  recommended. Electronically Signed   By: Anner Crete M.D.   On: 08/06/2019 19:08     I have independently reviewed the above radiologic studies and discussed with the patient  Recent Lab Findings: Lab Results  Component Value Date   WBC 20.1 (H) 08/06/2019   HGB 9.5 (L) 08/06/2019   HCT 28.0 (L) 08/06/2019   PLT 352 08/06/2019   GLUCOSE 190 (H) 08/06/2019   CHOL 158 12/24/2016   TRIG 79 12/24/2016   HDL 41 12/24/2016   LDLCALC 101 (H) 12/24/2016   ALT <5 08/06/2019   AST 25 08/06/2019   NA 133 (L) 08/06/2019   K 4.1 08/06/2019   CL 99 08/06/2019   CREATININE 1.10 08/06/2019   BUN 14 08/06/2019   CO2 21 (L) 08/06/2019   TSH 0.78 12/01/2015   INR 1.4 (H) 08/06/2019   HGBA1C 5.6 07/06/2016      Assessment / Plan:      1.  Right upper lobe pneumonia, suspected cavitary lung lesion and possible lung abscess with severe sepsis.  CT of the chest listed above which shows a large multilocular cystic air and fluid collection within the right upper lobe extending into the right middle lobe.  There is also a small cystic cavitary lesion with adjacent pleural thickening in the posterior left upper lobe.  These findings could be suggestive of multifocal cavitary lung abscess due to bacterial or fungal infection such as TB.  Continue empiric antibiotics at this time. 2.  Malnutrition with decreased appetite over the last few weeks and an unintentional 20 pound weight loss. Will need supplemental nutrition.  3.  Hypothermia-body temperature was 95.5 upon arrival and external warming was initiated. Now improved 98.9 4.  Hypotension-now improved with IV fluid resuscitation 5.  Leukocytosis WBC count on arrival was 20,000 with left shift. 6.  Blood cultures x2, urine culture, and COVID-19 rapid antigen test pending at this time.  QuantiFERON gold test ordered to rule out TB. 7. Tobacco use-nicotine patch provided   Plan: Dr. Prescott Gum to review films. Procedures from Bronchoscopy to VATs  explained to the patient in detail. Did tell the patient that he might not be a candidate right now due to his acutely ill state but ultimately the decision is up to Dr. Prescott Gum.  All questions answered to the patient's satisfaction.      I  spent 20 minutes counseling the patient face to face. Nicholes Rough, PA-C 08/07/2019 10:18 AM  Patient examined, images of CT scan of chest personally reviewed.  Patient discussed with Ms Harriet Pho , [place] for coordination of care 59 year old male smoker presents with productive cough, fever, white count 20 K, lactic acid 8.2 and anemia hemoglobin 6.3.  The patient is cachectic with significant weight loss and severe protein loss malnutrition and albumin 1.2 His CT scan shows probable infectious cavitary right upper lobe pneumonia with extension into the right middle lobe -does not appear to be neoplastic.  Certainly tuberculosis needs to be ruled out  The patient should respond to antibiotics and surgery is not indicated at this time.  The patient would poorly tolerate anesthesia for bronchoscopy at this time and I would recommend IV antibiotics and treatment of his severe metabolic deficits initially.  Will follow but patient does not meet criteria for surgery at this time.

## 2019-08-07 NOTE — ED Notes (Signed)
Breakfast Ordered 

## 2019-08-07 NOTE — ED Notes (Signed)
Lunch Tray Ordered @ 1114.  

## 2019-08-07 NOTE — Progress Notes (Addendum)
Pulm note  Reviewed case with Dr. Beryle Lathe. Spoke with patient briefly. Heavy smoker, profoundly malnourished with cavitary RUL mass vs. abscess. Already produced sputum for culture. Would treat with unasyn vs. Augmentin awaiting culture data Reasonable to r/o TB but doubt. Needs at least 6 weeks antibiotics. Can consider bronch if sputum specimen does not provide answer or imaging fails to show improvement Could consider VATS down the line if nutritional status improves  Call if you would like formal consult.  Erskine Emery MD Pulmonology

## 2019-08-08 LAB — URINE CULTURE: Culture: NO GROWTH

## 2019-08-08 LAB — MRSA PCR SCREENING: MRSA by PCR: NEGATIVE

## 2019-08-08 MED ORDER — POTASSIUM CHLORIDE CRYS ER 20 MEQ PO TBCR
20.0000 meq | EXTENDED_RELEASE_TABLET | Freq: Every day | ORAL | Status: DC
  Administered 2019-08-08 – 2019-08-12 (×5): 20 meq via ORAL
  Filled 2019-08-08 (×5): qty 1

## 2019-08-08 MED ORDER — METHOCARBAMOL 500 MG PO TABS
500.0000 mg | ORAL_TABLET | Freq: Three times a day (TID) | ORAL | Status: AC | PRN
  Administered 2019-08-08 – 2019-08-09 (×3): 500 mg via ORAL
  Filled 2019-08-08 (×3): qty 1

## 2019-08-08 MED ORDER — ACETAMINOPHEN 325 MG PO TABS
650.0000 mg | ORAL_TABLET | Freq: Four times a day (QID) | ORAL | Status: DC | PRN
  Administered 2019-08-08 – 2019-08-12 (×5): 650 mg via ORAL
  Filled 2019-08-08 (×5): qty 2

## 2019-08-08 MED ORDER — SODIUM CHLORIDE 0.9 % IV SOLN
510.0000 mg | Freq: Once | INTRAVENOUS | Status: AC
  Administered 2019-08-08: 510 mg via INTRAVENOUS
  Filled 2019-08-08: qty 17

## 2019-08-08 MED ORDER — TRAZODONE HCL 50 MG PO TABS
50.0000 mg | ORAL_TABLET | Freq: Every day | ORAL | Status: AC
  Administered 2019-08-08: 50 mg via ORAL
  Filled 2019-08-08: qty 1

## 2019-08-08 NOTE — Progress Notes (Signed)
Patient is  running temp of 101.3 and no order for Tylenol. Notified Baltazar Najjar and received an order for Tylenol 650 mg q 6 hours as needed. Will administer and continue to monitor.

## 2019-08-08 NOTE — Progress Notes (Signed)
Hospitalist progress note   Chosen Beilke EY:[id number] DOB: May 28, 1961 DOA: 08/06/2019  PCP: Ladell Pier, MD   Narrative:   52 known history anxiety depression tobacco abuse HTN chronic back pain with myelopathy status post procedure 02/2018, possible osteomyelitis right toe in the past followed by ID, 7 mm polyp 2018 on scope Admitted with 3-week history of shortness of breath cough-year-old black male unintentional 20 pound weight loss  Found to have sepsis on arrival to emergency room T-max 95.5 WBC 20 lactic acid 8 Also found to have microcytic anemia hemoglobin 6.3 MCV 78--iron levels 5 saturation not calculated ferritin 377  CT of chest shows large multilocular cystic air-fluid collection within the right upper lung into the right middle lobe and small cystic cavitary lesion?  Multifocal cavitary lung abscess due to bacterial fungal infection or TB  HIV negative, C. difficile GI pathogen panel pending, fecal occult negative and he has had brown stool throughout the time that he has been ill over the past 3 weeks  Patient transfused 2 units PRBC  Assessment & Plan: Sepsis secondary to cavitary pneumonia rule out TB Await sputum-not operative/bronc candidate secondary to frailty Continue broad-spectrum Unasyn to cover Repeat imaging in about 5 days Blood culture urine culture negative Sputum cultures pending Lactic acid resolved White count down from 20-12 Diarrhea on admission Looks like it resolved this no cultures or pathogen panel were done Microcytic anemia-possibly nutritional or chronic disease Hemoglobin trends down to 8.2 monitor Received 1U PRBC 1/12 We will give IV iron 1 dose Mild hypokalemia CKD stage I Replace with potassium orally Monitor trends  Subjective: Hungry Visibly weak as try to sit up at the side of the bed and becomes winded Consultants:   Telephone consulted Dr. Tamala Julian of [facility]  [facility]TS Dr. Darcey Nora Procedures:    Antimicrobials:    Unasyn   Objective: Vitals:   08/08/19 0308 08/08/19 0530 08/08/19 0739 08/08/19 1108  BP: (!) 128/99 123/89 124/89 126/84  Pulse: (!) 105 (!) 111 (!) 104 (!) 102  Resp: (!) 22 15 (!) 25 20  Temp:  99 F (37.2 C) 98.2 F (36.8 C) 99 F (37.2 C)  TempSrc:  Oral Oral Oral  SpO2: 98% 100% 96% 100%  Weight:  58.9 kg    Height:  6\' 1"  (1.854 m)      Intake/Output Summary (Last 24 hours) at 08/08/2019 1253 Last data filed at 08/08/2019 0309 Gross per 24 hour  Intake 100 ml  Output 950 ml  Net -850 ml   Filed Weights   08/06/19 1848 08/08/19 0530  Weight: 45.4 kg 58.9 kg    Examination: Awake alert cachectic EOMI NCAT Decreased air entry bilaterally Abdomen soft nontender no rebound no guarding Neurologically intact  Scheduled Meds: . nicotine  7 mg Transdermal Daily  . potassium chloride  20 mEq Oral Daily   Continuous Infusions: . ampicillin-sulbactam (UNASYN) IV 3 g (08/08/19 1107)  . ferumoxytol       LOS: 2 days   Time spent: Twain, MD [facility]  08/08/2019, 12:53 PM

## 2019-08-09 ENCOUNTER — Inpatient Hospital Stay (HOSPITAL_COMMUNITY): Payer: Medicare Other

## 2019-08-09 LAB — CULTURE, RESPIRATORY W GRAM STAIN: Culture: NORMAL

## 2019-08-09 LAB — BASIC METABOLIC PANEL
Anion gap: 8 (ref 5–15)
BUN: 12 mg/dL (ref 6–20)
CO2: 24 mmol/L (ref 22–32)
Calcium: 7.2 mg/dL — ABNORMAL LOW (ref 8.9–10.3)
Chloride: 102 mmol/L (ref 98–111)
Creatinine, Ser: 0.91 mg/dL (ref 0.61–1.24)
GFR calc Af Amer: >60 mL/min (ref >60)
GFR calc non Af Amer: >60 mL/min (ref >60)
Glucose, Bld: 104 mg/dL — ABNORMAL HIGH (ref 70–99)
Potassium: 3.8 mmol/L (ref 3.5–5.1)
Sodium: 134 mmol/L — ABNORMAL LOW (ref 135–145)

## 2019-08-09 LAB — CBC WITH DIFFERENTIAL/PLATELET
Abs Immature Granulocytes: 0.08 10*3/uL — ABNORMAL HIGH (ref 0.00–0.07)
Basophils Absolute: 0 10*3/uL (ref 0.0–0.1)
Basophils Relative: 0 %
Eosinophils Absolute: 0.1 10*3/uL (ref 0.0–0.5)
Eosinophils Relative: 1 %
HCT: 23.1 % — ABNORMAL LOW (ref 39.0–52.0)
Hemoglobin: 7.4 g/dL — ABNORMAL LOW (ref 13.0–17.0)
Immature Granulocytes: 1 %
Lymphocytes Relative: 25 %
Lymphs Abs: 2.2 10*3/uL (ref 0.7–4.0)
MCH: 25.4 pg — ABNORMAL LOW (ref 26.0–34.0)
MCHC: 32 g/dL (ref 30.0–36.0)
MCV: 79.4 fL — ABNORMAL LOW (ref 80.0–100.0)
Monocytes Absolute: 1.1 10*3/uL — ABNORMAL HIGH (ref 0.1–1.0)
Monocytes Relative: 12 %
Neutro Abs: 5.1 10*3/uL (ref 1.7–7.7)
Neutrophils Relative %: 61 %
Platelets: 269 10*3/uL (ref 150–400)
RBC: 2.91 MIL/uL — ABNORMAL LOW (ref 4.22–5.81)
RDW: 19.5 % — ABNORMAL HIGH (ref 11.5–15.5)
WBC: 8.5 10*3/uL (ref 4.0–10.5)
nRBC: 0 % (ref 0.0–0.2)

## 2019-08-09 IMAGING — CR DG HIP (WITH OR WITHOUT PELVIS) 2-3V LEFT
4 series · 4 of 4 positions shown · non-contrast
Comparison: None.

CLINICAL DATA: Left hip pain since a slip and fall last night.
Initial encounter.

EXAM:
DG HIP (WITH OR WITHOUT PELVIS) 2-3V LEFT

[pelvis ap]
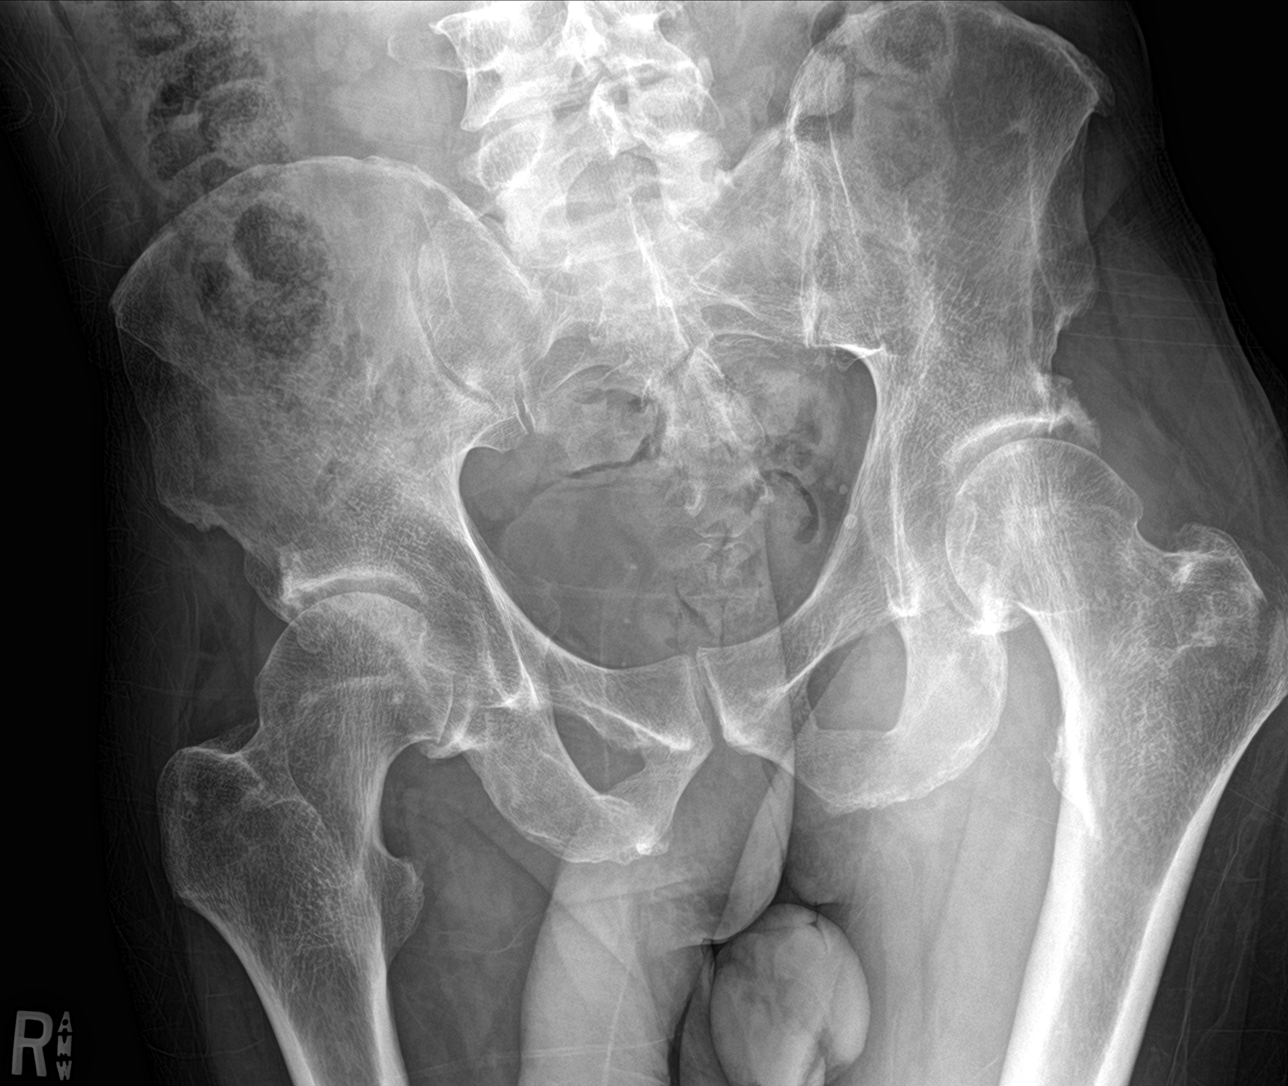

[hip ap]
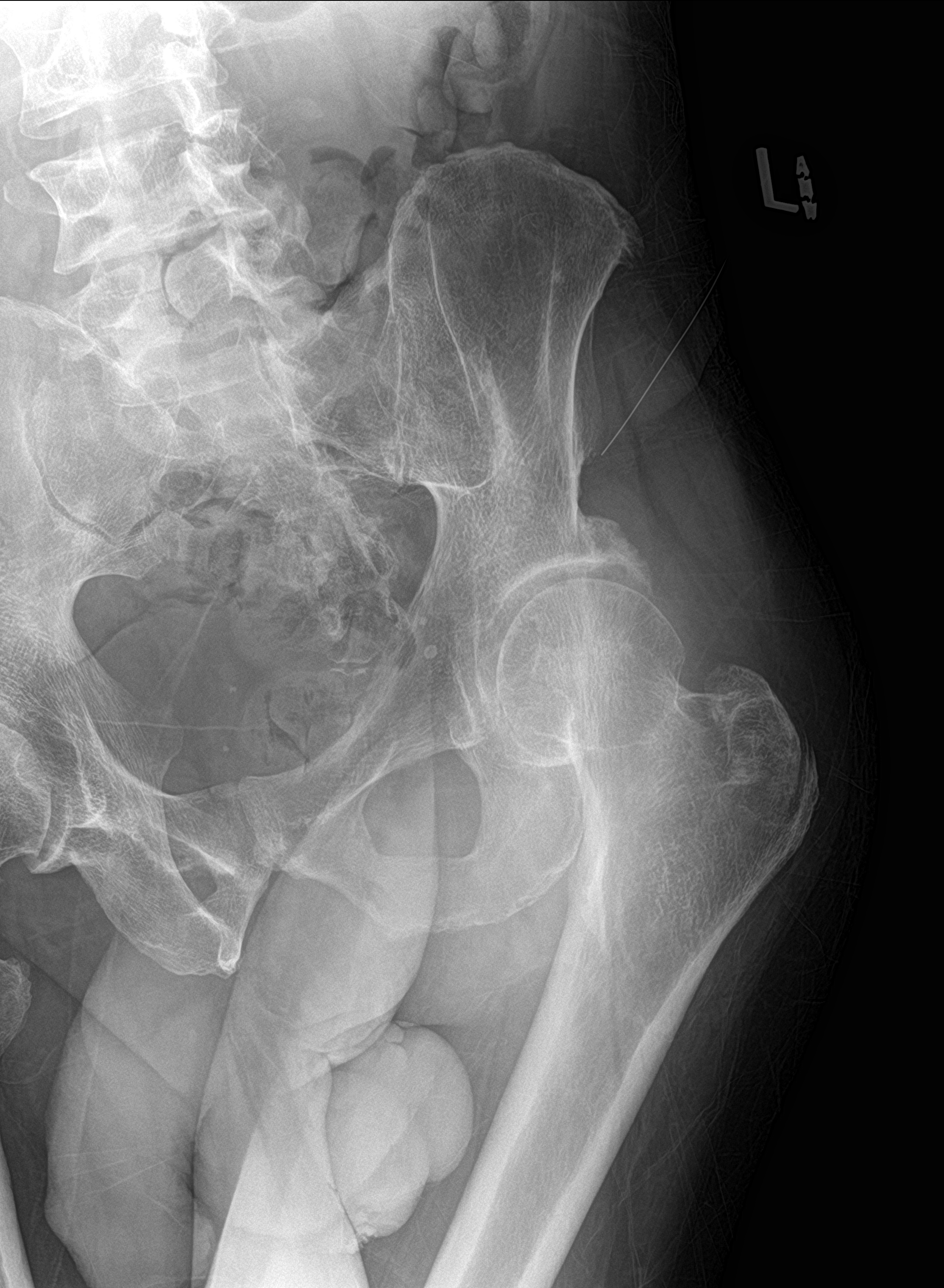

[hip lat (1 of 2)]
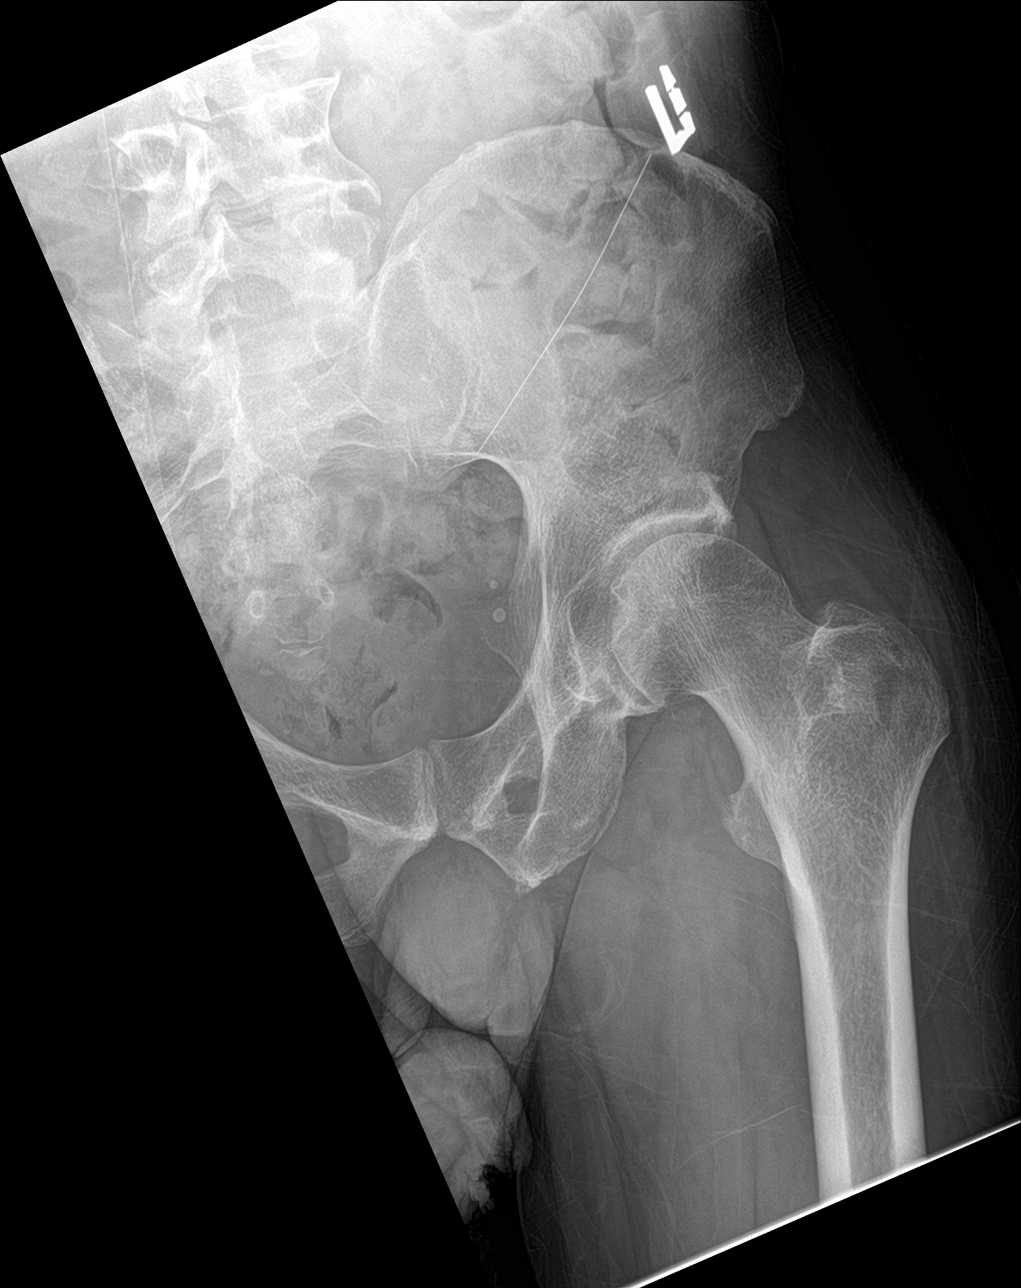

[hip lat (2 of 2)]
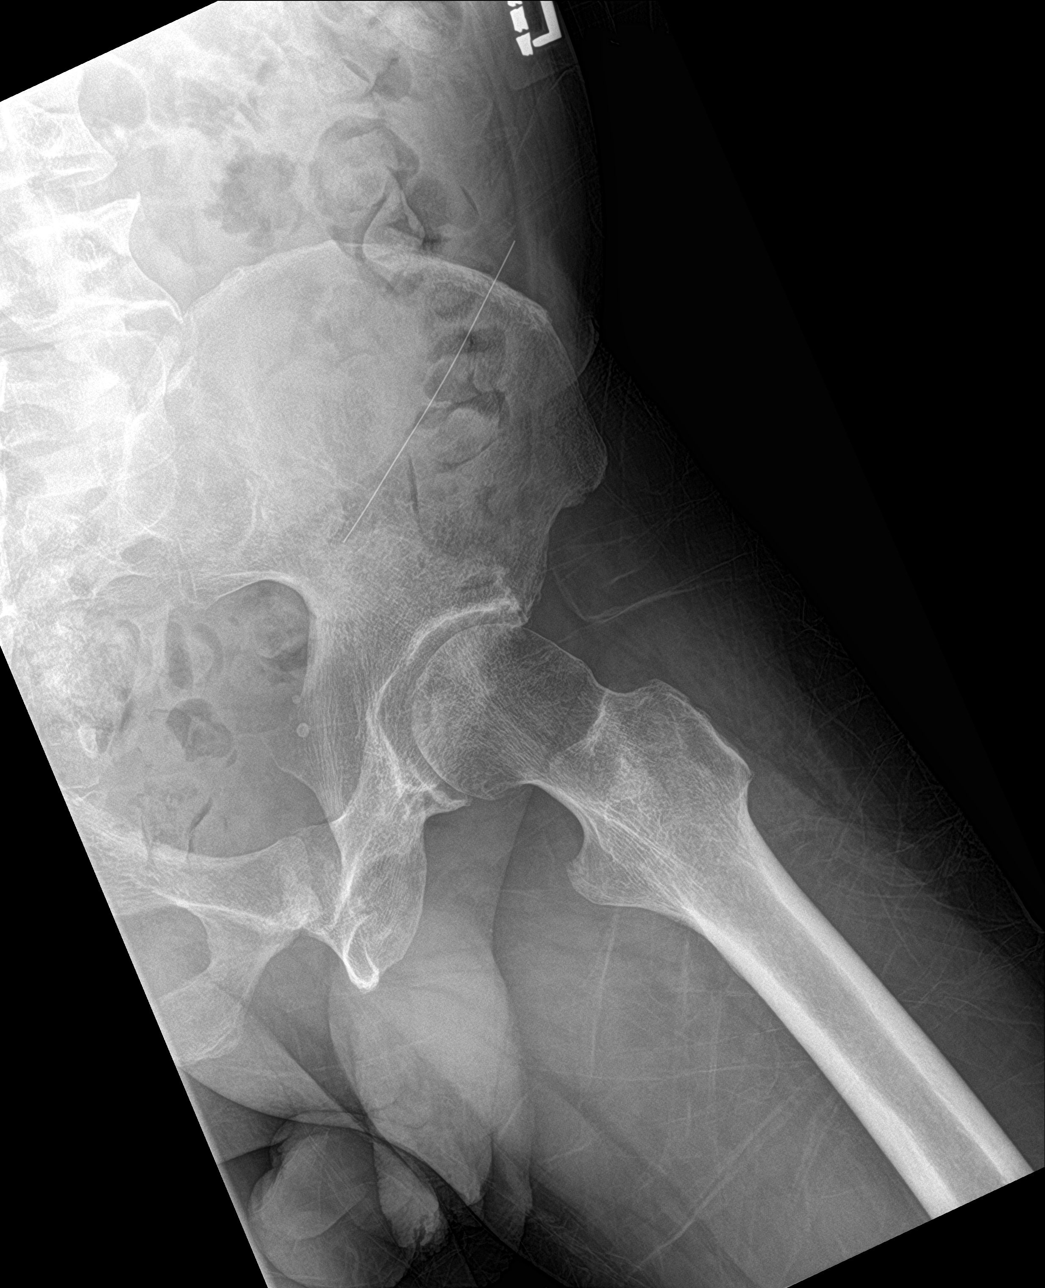

[4 of 4 positions shown; findings below may reference images not displayed]

FINDINGS: There is no evidence of hip fracture or dislocation. There no focal
lesion. Mild degenerative disease about the hips noted
IMPRESSION: No acute abnormality.

## 2019-08-09 IMAGING — CT CT OF THE LEFT HIP WITHOUT CONTRAST
2 of 3 series · 17 of 46 positions shown, 19 images · non-contrast
Comparison: Hip radiography from earlier today

CLINICAL DATA: Fall from standing last night

EXAM:
CT OF THE LEFT HIP WITHOUT CONTRAST
TECHNIQUE: Multidetector CT imaging of the left hip was performed according to
the standard protocol. Multiplanar CT image reconstructions were
also generated.

[Series 3: pelvis 2.0 st · axial · 0.47mm/px · z∈[+747,+1011]mm · 14 of 152 slices shown, 16 images]
[im 10/152  soft-tissue]
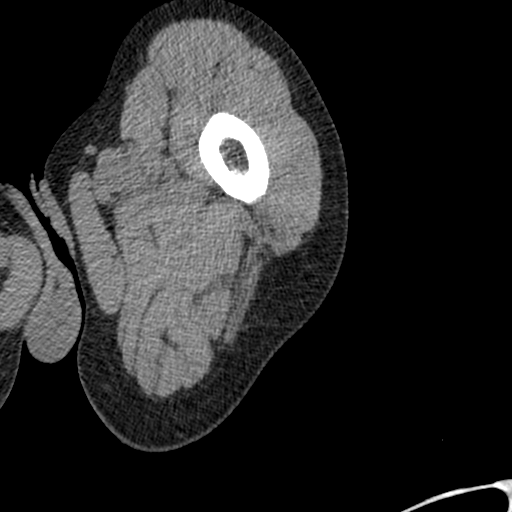
[im 10/152  bone]
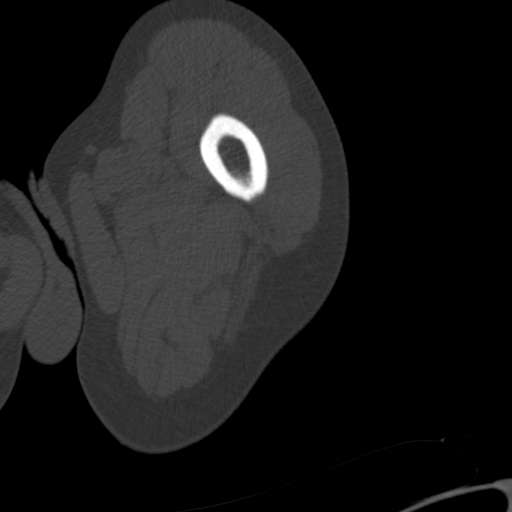
[im 20/152  soft-tissue]
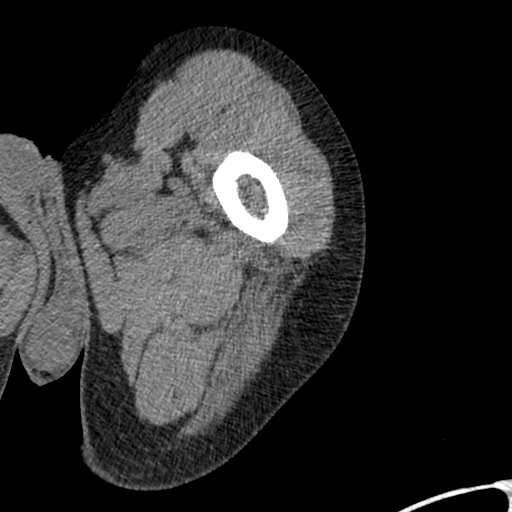
[im 30/152  soft-tissue]
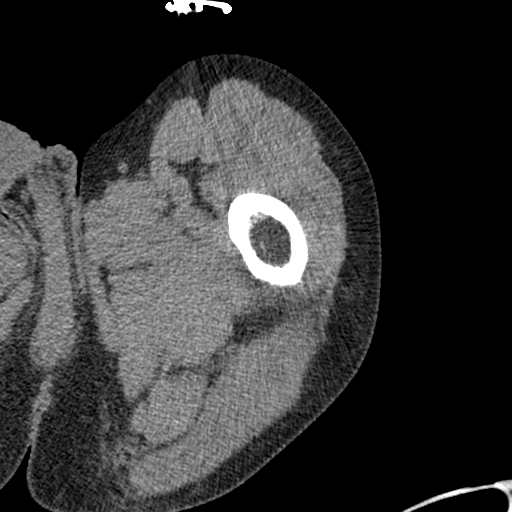
[im 39/152  soft-tissue]
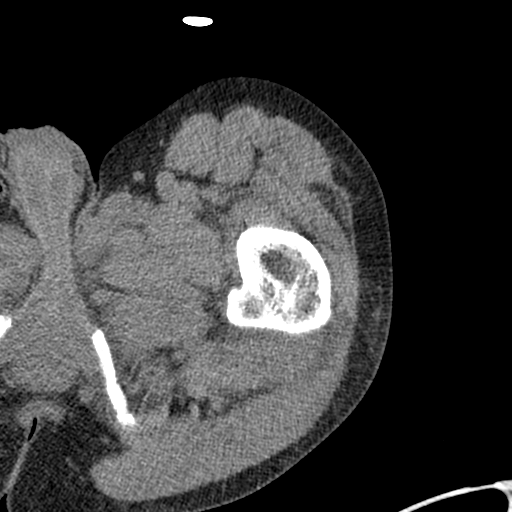
[im 49/152  soft-tissue]
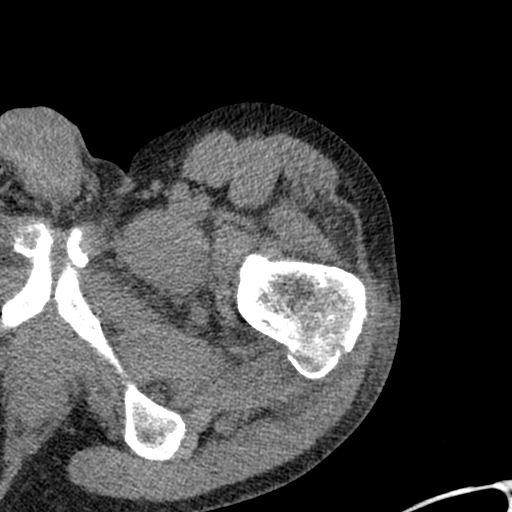
[im 59/152  soft-tissue]
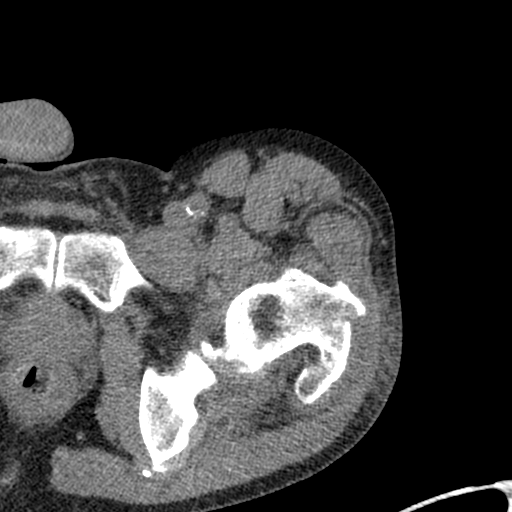
[im 69/152  soft-tissue]
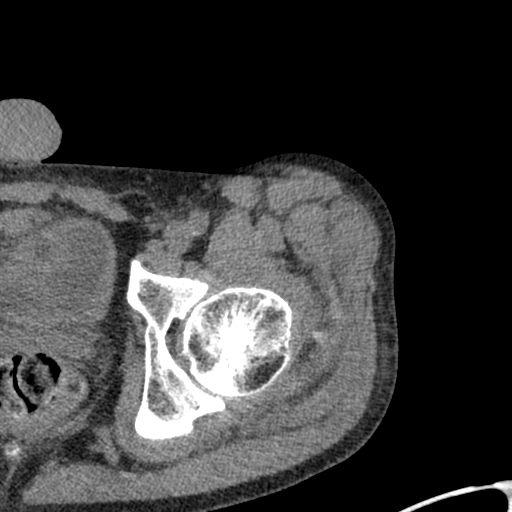
[im 83/152  soft-tissue]
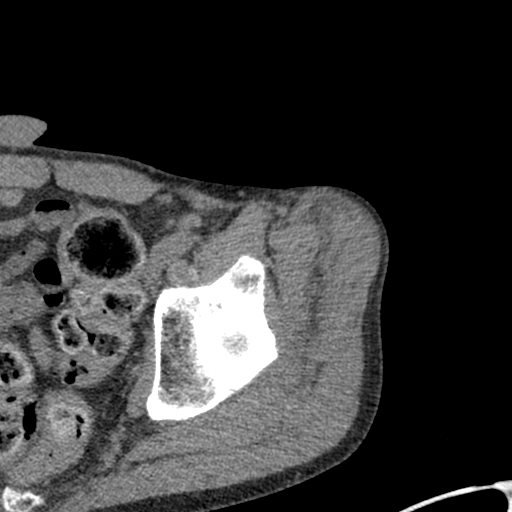
[im 93/152  soft-tissue]
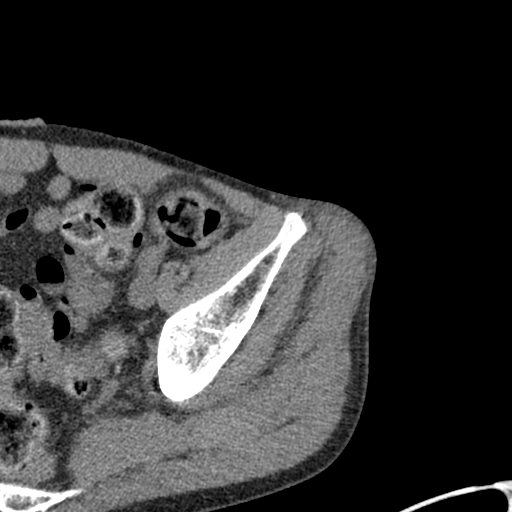
[im 93/152  bone]
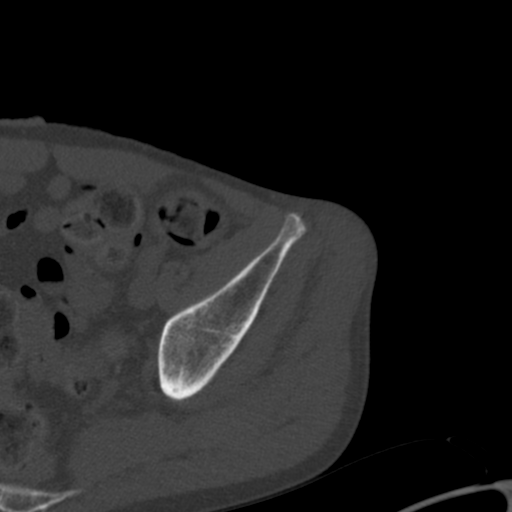
[im 103/152  soft-tissue]
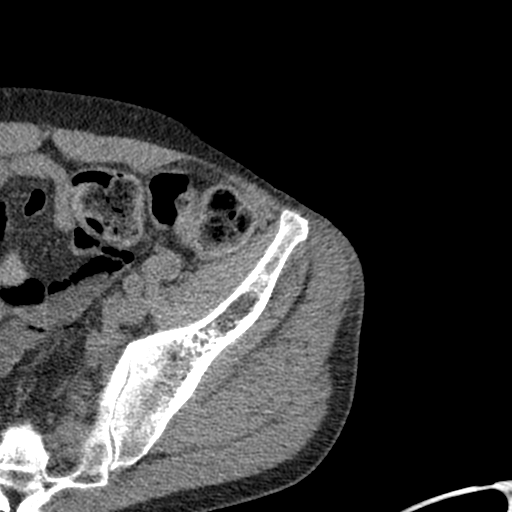
[im 113/152  soft-tissue]
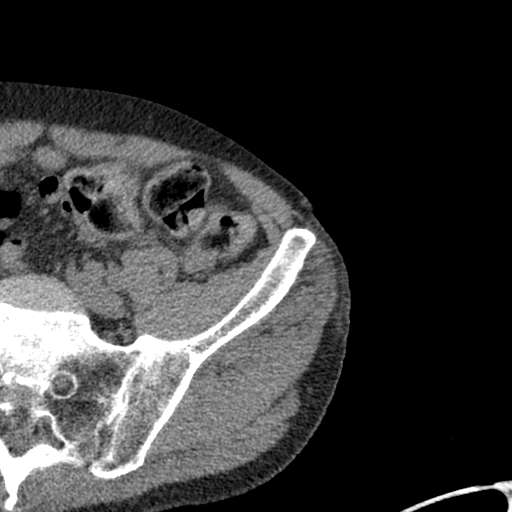
[im 122/152  soft-tissue]
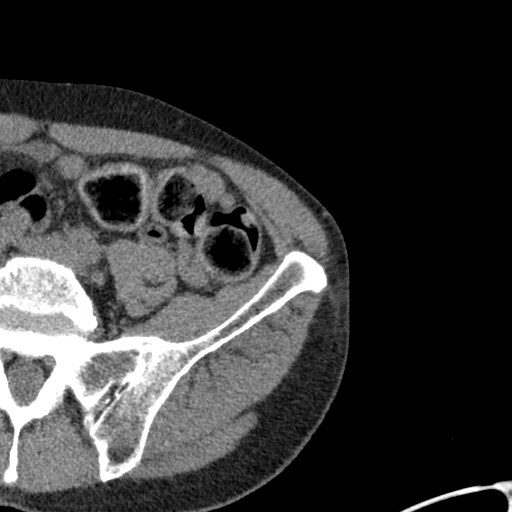
[im 132/152  soft-tissue]
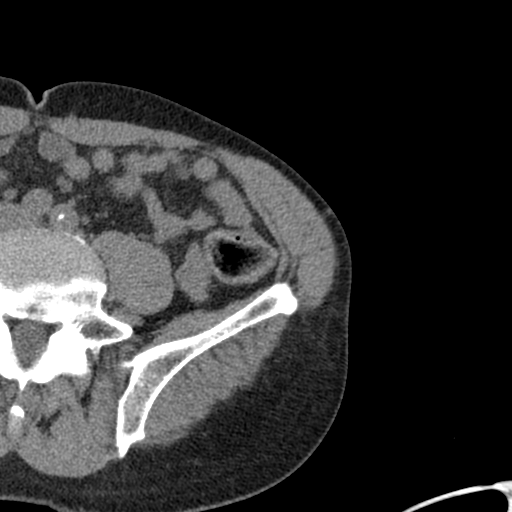
[im 142/152  soft-tissue]
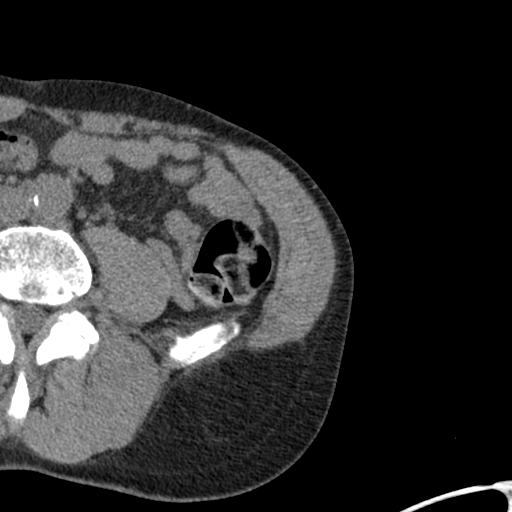

[Series 8: coronal st · coronal · 0.52mm/px · 3 of 190 slices shown]
[im 64/190  soft-tissue]
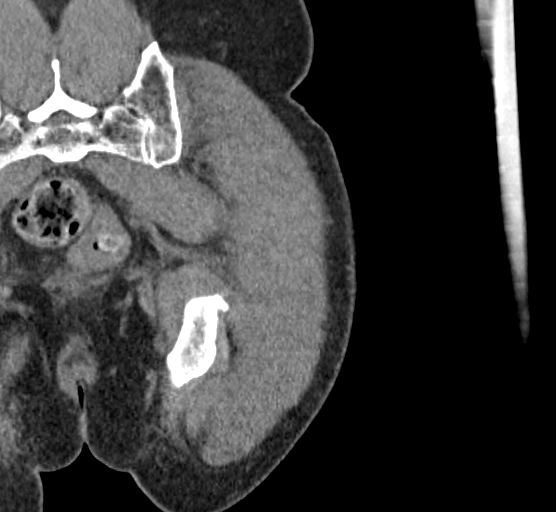
[im 85/190  soft-tissue]
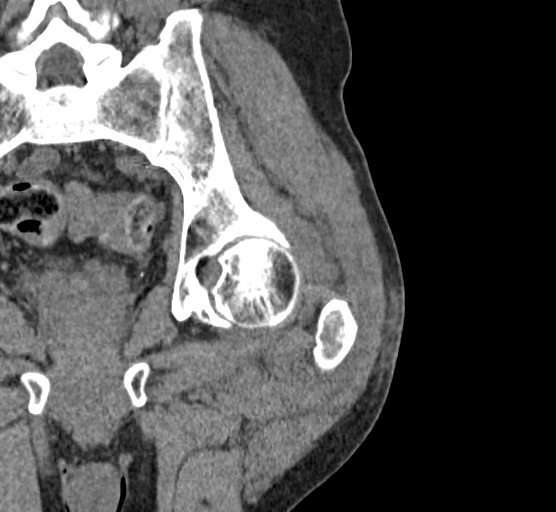
[im 106/190  soft-tissue]
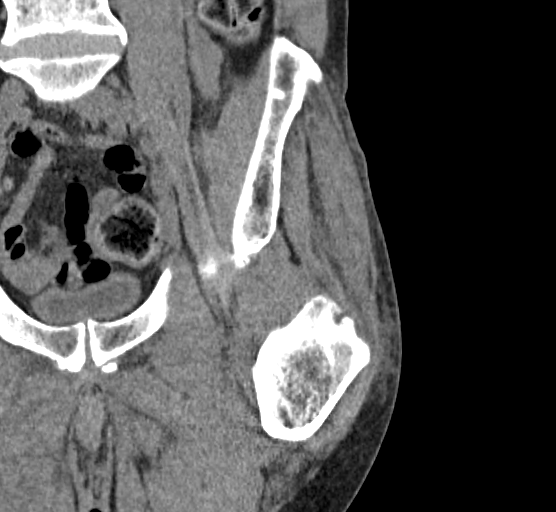

[17 of 46 positions shown; findings below may reference images not displayed]

FINDINGS: Bones/Joint/Cartilage

Acute greater trochanter fracture of the femur with mild medial
displacement. No continuation across the intertrochanteric femur
seen. Intact femoral neck. Left hip is located.

Ligaments

Suboptimally assessed by CT.

Muscles and Tendons

No evidence of major musculoligamentous disruption.

Soft tissues

Expected edema about the fracture.  Negative for joint effusion.
IMPRESSION: Minimally displaced greater trochanter fracture of the left femur.

## 2019-08-09 MED ORDER — NICOTINE 7 MG/24HR TD PT24
7.0000 mg | MEDICATED_PATCH | Freq: Once | TRANSDERMAL | Status: AC
  Administered 2019-08-09: 7 mg via TRANSDERMAL

## 2019-08-09 MED ORDER — TRAZODONE HCL 50 MG PO TABS
50.0000 mg | ORAL_TABLET | Freq: Every day | ORAL | Status: AC
  Administered 2019-08-09: 50 mg via ORAL
  Filled 2019-08-09: qty 1

## 2019-08-09 MED ORDER — TRAMADOL HCL 50 MG PO TABS
50.0000 mg | ORAL_TABLET | Freq: Once | ORAL | Status: AC
  Administered 2019-08-09: 50 mg via ORAL
  Filled 2019-08-09: qty 1

## 2019-08-09 NOTE — Progress Notes (Signed)
  Subjective: Fever 101 yesterday pm, WBC now normal PCXR today reviewed showing no change RUL abscess with destruction of pulmonary parenchyma Remains anemic with Hb 7.3, albumin 1.3 Echo cardiogram shows normal ventricular function, no definite endocarditis Patient currently not surgical candidate- needs further antibiotics as best initial therapy for lung abscess  Since lung abscess probably  from aspiration will assess for dental disease with panorex xray Objective: Vital signs in last 24 hours: Temp:  [98.5 F (36.9 C)-101.3 F (38.5 C)] 99.3 F (37.4 C) (01/14 0939) Pulse Rate:  [97-110] 97 (01/14 0939) Cardiac Rhythm: Normal sinus rhythm (01/14 0800) Resp:  [18-21] 18 (01/14 0939) BP: (99-135)/(61-97) 127/89 (01/14 0939) SpO2:  [95 %-100 %] 100 % (01/14 0939)  Hemodynamic parameters for last 24 hours:    Intake/Output from previous day: 01/13 0701 - 01/14 0700 In: 355 [P.O.:355] Out: 1200 [Urine:1200] Intake/Output this shift: No intake/output data recorded.    Lab Results: Recent Labs    08/07/19 0832 08/09/19 0146  WBC 12.8* 8.5  HGB 8.2* 7.4*  HCT 26.3* 23.1*  PLT 333 269   BMET:  Recent Labs    08/07/19 0832 08/09/19 0146  NA 134* 134*  K 3.3* 3.8  CL 100 102  CO2 22 24  GLUCOSE 107* 104*  BUN 14 12  CREATININE 1.15 0.91  CALCIUM 7.2* 7.2*    PT/INR:  Recent Labs    08/06/19 1831  LABPROT 16.7*  INR 1.4*   ABG    Component Value Date/Time   TCO2 13 (L) 08/06/2019 1848   CBG (last 3)  No results for input(s): GLUCAP in the last 72 hours.  Assessment/Plan: S/P  RUL lung abscess prob from aspiration Sepsis - improving Severe protein loss malnutrition  Cont iv antibiotics  LOS: 3 days    Tharon Aquas Trigt III 08/09/2019

## 2019-08-09 NOTE — TOC Initial Note (Addendum)
Transition of Care [facility]) - Initial/Assessment Note    Patient Details  Name: Calvin [name] MRN: [id number] Date of Birth: 1960-10-07  Transition of Care [facility]) CM/SW Contact:    Carles Collet, RN Phone Number: 08/09/2019, 10:41 AM  Clinical Narrative:       Damaris Schooner w patient on the phone.  He states that he is from home. He lives with hs brother Gwenlyn Perking 59 years old) and roommate Randall Hiss (47 years old). He states that Gwenlyn Perking is home with him all day and can assist with the home IV Abx. He states that Randall Hiss is home most of the time. He states that they do not drive, but are able to get help with transportation from friends when needed.  He states that he has electricity, hot water, heat and a telephone at the house. He states that there is some mold above the fireplace but that he stays in hs bedroom most of the time away from that area.  We discussed the possibility of a SNF, he declined due to risk of COVID at the facilities. I informed him that his sister called the CM offfice, requesting I call and discuss his discharge plan with her. He did NOT grant me permission to call her back. He stated that she can be aggressive and he didn't want his discharge plan discussed with her.   I informed Carolynn Sayers with [facility] of case, and requested her to follow for home IV antibiotics.   Referral made to [facility] services- awaiting call back to confirm accepted.   He states that his PCP is Aundra Dubin at [facility] and that he is active at the clinic. Cpoverage through [facility] Medicare and Medicaid.               Expected Discharge Plan: [place] Barriers to Discharge: Continued Medical Work up   Patient Goals and CMS Choice Patient states their goals for this hospitalization and ongoing recovery are:: patient wants to go home CMS Medicare.gov Compare Post Acute Care list provided to:: Patient Choice offered to / list presented to : Patient  Expected Discharge Plan and  Services Expected Discharge Plan: [place]   Discharge Planning Services: CM Consult Post Acute Care Choice: Home Health                                        Prior Living Arrangements/Services   Lives with:: Roommate, Relatives                   Activities of Daily Living      Permission Sought/Granted                  Emotional Assessment              Admission diagnosis:  CAP (community acquired pneumonia) [J18.9] Severe sepsis ([place]) [A41.9, R65.20] Anemia, unspecified type [D64.9] Patient Active Problem List   Diagnosis Date Noted  . Severe sepsis ([place]) 08/07/2019  . Lobar pneumonia ([place]) 08/07/2019  . Diarrhea 08/07/2019  . Anemia 08/07/2019  . Cervical myelopathy ([place]) 08/15/2018  . Foot laceration, right, initial encounter   . Radiculopathy 03/08/2018  . Tubular adenoma 05/16/2017  . Depressed mood 05/16/2017  . Substance abuse ([place]) 05/16/2017  . Hallux valgus 08/03/2016  . Disc disorder   . Lumbar facet arthropathy 08/06/2014  . Lumbar radiculopathy  08/06/2014  . ED (erectile dysfunction) 01/07/2014  . Unspecified vitamin D deficiency 01/07/2014  . Left shoulder pain 08/10/2013  . Chronic low back pain 08/10/2013  . Essential hypertension 08/10/2013  . Tobacco abuse 08/10/2013   PCP:  Ladell Pier, MD Pharmacy:   [facility] [place] [place] [address] Phone: [phone] Fax: [phone]  [place], [place] [address] [place] [address] Phone: [phone] Fax: [phone]     Social Determinants of Health (SDOH) Interventions    Readmission Risk Interventions No flowsheet data found.

## 2019-08-09 NOTE — Progress Notes (Signed)
Hospitalist progress note   Calvin [name] [id number] DOB: 10/10/1960 DOA: 08/06/2019  PCP: Ladell Pier, MD   Narrative:   63 known history anxiety depression tobacco abuse HTN chronic back pain with myelopathy status post procedure 02/2018, possible osteomyelitis right toe in the past followed by ID, 7 mm polyp 2018 on scope Admitted with 3-week history of shortness of breath cough-year-old black male unintentional 20 pound weight loss  Found to have sepsis on arrival to emergency room T-max 95.5 WBC 20 lactic acid 8 Also found to have microcytic anemia hemoglobin 6.3 MCV 78--iron levels 5 saturation not calculated ferritin 377  CT of chest shows large multilocular cystic air-fluid collection within the right upper lung into the right middle lobe and small cystic cavitary lesion?  Multifocal cavitary lung abscess due to bacterial fungal infection or TB  HIV negative, C. difficile GI pathogen panel pending, fecal occult negative and he has had brown stool throughout the time that he has been ill over the past 3 weeks  Patient transfused 2 units PRBC  White count down from 20-8 Transfused on admission 2 units PRBC from 6.3 Hemoglobin drifting slowly 8.2-->7.4 BUN/creatinine 12/0.9 Calcium 3.3-->3.8  Assessment & Plan: Sepsis secondary to cavitary pneumonia rule out TB Tmax today 101.3 Await sputum-not operative/bronc candidate 2/2 frailty R/o TB with 3 am sputums Continue broad-spectrum Unasyn--- still with spiking fevers and would like- sputum culture which is still pending in the next 24 hours--CXR as above Blood culture + urine culture negative Lactic acid resolved Diarrhea on admission Looks like it resolved this no cultures or pathogen panel were done Microcytic anemia-possibly nutritional or chronic disease Received 1U PRBC 1/12, IV iron 1/13 Mild hypokalemia CKD stage I Replaced  Scd, Full code, inpatient awaiting improvement  Subjective: Awake coherent no  distress Eating food and no diarr fever chills  Consultants:   Telephone consulted Dr. Tamala Julian of [facility]  CVTS Dr. Darcey Nora Procedures:    Antimicrobials:   Unasyn   Objective: Vitals:   08/08/19 2048 08/08/19 2213 08/09/19 0216 08/09/19 0525  BP: 113/87   115/85  Pulse: (!) 103 97    Resp:  (!) 21  20  Temp: 99.6 F (37.6 C) 98.5 F (36.9 C) 98.8 F (37.1 C) 98.5 F (36.9 C)  TempSrc: Oral Oral  Oral  SpO2: 100% 100%  100%  Weight:      Height:        Intake/Output Summary (Last 24 hours) at 08/09/2019 0727 Last data filed at 08/09/2019 0100 Gross per 24 hour  Intake 355 ml  Output 1200 ml  Net -845 ml   Filed Weights   08/06/19 1848 08/08/19 0530  Weight: 45.4 kg 58.9 kg     Examination: eomi ncat emaciated cta b no added sound abd soft nt nd no rebound no guard Neuro intact-power 5/5/, sensory intact  Scheduled Meds: . nicotine  7 mg Transdermal Daily  . potassium chloride  20 mEq Oral Daily   Continuous Infusions: . ampicillin-sulbactam (UNASYN) IV 3 g (08/09/19 1228)     LOS: 3 days   Time spent: [place], MD [facility]  08/09/2019, 7:27 AM

## 2019-08-10 ENCOUNTER — Other Ambulatory Visit: Payer: Self-pay | Admitting: Internal Medicine

## 2019-08-10 DIAGNOSIS — M5416 Radiculopathy, lumbar region: Secondary | ICD-10-CM

## 2019-08-10 DIAGNOSIS — R252 Cramp and spasm: Secondary | ICD-10-CM

## 2019-08-10 LAB — TYPE AND SCREEN
ABO/RH(D): A POS
Antibody Screen: NEGATIVE
Unit division: 0
Unit division: 0
Unit division: 0

## 2019-08-10 LAB — RENAL FUNCTION PANEL
Albumin: 1.3 g/dL — ABNORMAL LOW (ref 3.5–5.0)
Anion gap: 9 (ref 5–15)
BUN: 8 mg/dL (ref 6–20)
CO2: 26 mmol/L (ref 22–32)
Calcium: 7.7 mg/dL — ABNORMAL LOW (ref 8.9–10.3)
Chloride: 100 mmol/L (ref 98–111)
Creatinine, Ser: 0.95 mg/dL (ref 0.61–1.24)
GFR calc Af Amer: >60 mL/min (ref >60)
GFR calc non Af Amer: >60 mL/min (ref >60)
Glucose, Bld: 107 mg/dL — ABNORMAL HIGH (ref 70–99)
Phosphorus: 1.9 mg/dL — ABNORMAL LOW (ref 2.5–4.6)
Potassium: 4 mmol/L (ref 3.5–5.1)
Sodium: 135 mmol/L (ref 135–145)

## 2019-08-10 LAB — CBC WITH DIFFERENTIAL/PLATELET
Abs Immature Granulocytes: 0.15 10*3/uL — ABNORMAL HIGH (ref 0.00–0.07)
Basophils Absolute: 0 10*3/uL (ref 0.0–0.1)
Basophils Relative: 0 %
Eosinophils Absolute: 0 10*3/uL (ref 0.0–0.5)
Eosinophils Relative: 0 %
HCT: 26.3 % — ABNORMAL LOW (ref 39.0–52.0)
Hemoglobin: 8.3 g/dL — ABNORMAL LOW (ref 13.0–17.0)
Immature Granulocytes: 1 %
Lymphocytes Relative: 14 %
Lymphs Abs: 2 10*3/uL (ref 0.7–4.0)
MCH: 25.5 pg — ABNORMAL LOW (ref 26.0–34.0)
MCHC: 31.6 g/dL (ref 30.0–36.0)
MCV: 80.9 fL (ref 80.0–100.0)
Monocytes Absolute: 1.2 10*3/uL — ABNORMAL HIGH (ref 0.1–1.0)
Monocytes Relative: 8 %
Neutro Abs: 11.2 10*3/uL — ABNORMAL HIGH (ref 1.7–7.7)
Neutrophils Relative %: 77 %
Platelets: 316 10*3/uL (ref 150–400)
RBC: 3.25 MIL/uL — ABNORMAL LOW (ref 4.22–5.81)
RDW: 20.4 % — ABNORMAL HIGH (ref 11.5–15.5)
WBC: 14.6 10*3/uL — ABNORMAL HIGH (ref 4.0–10.5)
nRBC: 0 % (ref 0.0–0.2)

## 2019-08-10 LAB — BPAM RBC
Blood Product Expiration Date: 202101142359
Blood Product Expiration Date: 202102042359
Blood Product Expiration Date: 202102042359
ISSUE DATE / TIME: 202101120114
ISSUE DATE / TIME: 202101120406
Unit Type and Rh: 600
Unit Type and Rh: 6200
Unit Type and Rh: 6200

## 2019-08-10 LAB — ACID FAST SMEAR (AFB, MYCOBACTERIA): Acid Fast Smear: NEGATIVE

## 2019-08-10 LAB — QUANTIFERON-TB GOLD PLUS: QuantiFERON-TB Gold Plus: UNDETERMINED — AB

## 2019-08-10 LAB — QUANTIFERON-TB GOLD PLUS (RQFGPL)
QuantiFERON Mitogen Value: 0.34 IU/mL
QuantiFERON Nil Value: 0.02 IU/mL
QuantiFERON TB1 Ag Value: 0.03 IU/mL
QuantiFERON TB2 Ag Value: 0.03 IU/mL

## 2019-08-10 MED ORDER — TRAZODONE HCL 50 MG PO TABS
50.0000 mg | ORAL_TABLET | Freq: Every day | ORAL | Status: AC
  Administered 2019-08-10: 50 mg via ORAL
  Filled 2019-08-10: qty 1

## 2019-08-10 MED FILL — HYDROCHLOROTHIAZIDE 25 MG T: 25 | 90 days supply | Qty: 90 | Fill #2

## 2019-08-10 MED FILL — SERTRALINE HCL 50 MG TABS: 50 | 30 days supply | Qty: 30 | Fill #2

## 2019-08-10 MED FILL — AMLODIPINE BESYLATE 10 MG T: 10 | 90 days supply | Qty: 90 | Fill #2

## 2019-08-10 NOTE — Progress Notes (Signed)
CT Surgery  Panorex shows multiple dental abscesses Probably the cause of RUL abscess from aspiration Needs dental eval and eventually dental extraction  Not clear at this point he will need VATS since antibiotics should clear infection once the source - dental abscess- cleared  P Prescott Gum MD

## 2019-08-10 NOTE — Progress Notes (Addendum)
Hospitalist progress note   Calvin [name] [id number] DOB: 1960-10-20 DOA: 08/06/2019  PCP: Ladell Pier, MD   Narrative:   63 known history anxiety depression tobacco abuse HTN chronic back pain with myelopathy status post procedure 02/2018, possible osteomyelitis right toe in the past followed by ID, 7 mm polyp 2018 on scope Admitted with 3-week history of shortness of breath cough-year-old black male unintentional 20 pound weight loss  Found to have sepsis on arrival to emergency room T-max 95.5 WBC 20 lactic acid 8 Also found to have microcytic anemia hemoglobin 6.3 MCV 78--iron levels 5 saturation not calculated ferritin 377  CT of chest shows large multilocular cystic air-fluid collection within the right upper lung into the right middle lobe and small cystic cavitary lesion?  Multifocal cavitary lung abscess due to bacterial fungal infection or TB  HIV negative, C. difficile GI pathogen panel pending, fecal occult negative and he has had brown stool throughout the time that he has been ill over the past 3 weeks  Patient transfused 2 units PRBC  White count down from 20-->14.5 Transfused on admission 2 units PRBC from 8.3 BUN/creatinine 12/0.9-->8/0.95 Calcium 3.3-->3.8  Assessment & Plan: Sepsis secondary to cavitary pneumonia rule out TB Wbc up low-grade fevers- R/o TB with 3 am sputums D/W with Dr. Megan Salon 1/15 clinical scenario and he feels based on presentation my description that patient does not have TB and airborne can be discontinued given patient's clinical improvement much appreciated Continue broad-spectrum Unasyn--not ready for transition given spiking fever still although this may be expected Blood culture + urine culture negative Lactic acid resolved Diarrhea on admission Looks like it resolved this no cultures or pathogen panel were done Dental Caries Follows with [facility] dental as OP--as not emergent, should follow with them in OP setting for  extractions Microcytic anemia-possibly nutritional or chronic disease Received 1U PRBC 1/12, IV iron 1/13 Mild hypokalemia CKD stage I Replaced  Scd, Full code, inpatient not ready for discharge monitor trends with fever his white count likely disposition over the next several days patient wishes to go home after treatment  Subjective: Awake coherent continues to improve although some low-grade temperatures and feels subjectively cold No chest pain Getting up a lot of sputum  Consultants:   Telephone consulted Dr. Tamala Julian of [facility]  CVTS Dr. Darcey Nora Procedures:    Antimicrobials:   Unasyn  Objective: Vitals:   08/09/19 2100 08/09/19 2102 08/10/19 0311 08/10/19 0429  BP: (!) 112/92 (!) 112/92 (!) 131/96 133/89  Pulse: 99 97 94 97  Resp: (!) 22 (!) 21 (!) 23 18  Temp:  99.5 F (37.5 C) 99.4 F (37.4 C) 99.2 F (37.3 C)  TempSrc:  Oral Oral Oral  SpO2: 97% 100% 100% 100%  Weight:      Height:        Intake/Output Summary (Last 24 hours) at 08/10/2019 0728 Last data filed at 08/10/2019 0500 Gross per 24 hour  Intake 680 ml  Output 1900 ml  Net -1220 ml   Filed Weights   08/06/19 1848 08/08/19 0530  Weight: 45.4 kg 58.9 kg     Examination: EOMI NCAT no focal deficit Crackly bilateral Soft scaphoid no rebound Asthenic ROM intact Skin soft supple no swelling  Scheduled Meds: . nicotine  7 mg Transdermal Daily  . nicotine  7 mg Transdermal Once  . potassium chloride  20 mEq Oral Daily   Continuous Infusions: . ampicillin-sulbactam (UNASYN) IV 3 g (08/10/19 0309)     LOS: 4  days   Time spent: [place], MD [facility]  08/10/2019, 7:28 AM

## 2019-08-10 NOTE — Progress Notes (Signed)
CM consulted for PCP. Pt has a PCP at [facility]. If MD has changed they will still follow him at the clinic. He will also get medication assistance through their pharmacy. TOC following for further d/c needs.

## 2019-08-11 LAB — CULTURE, BLOOD (ROUTINE X 2): Culture: NO GROWTH

## 2019-08-11 LAB — COMPREHENSIVE METABOLIC PANEL
ALT: 16 U/L (ref 0–44)
AST: 22 U/L (ref 15–41)
Albumin: 1.3 g/dL — ABNORMAL LOW (ref 3.5–5.0)
Alkaline Phosphatase: 115 U/L (ref 38–126)
Anion gap: 7 (ref 5–15)
BUN: 11 mg/dL (ref 6–20)
CO2: 24 mmol/L (ref 22–32)
Calcium: 7.6 mg/dL — ABNORMAL LOW (ref 8.9–10.3)
Chloride: 105 mmol/L (ref 98–111)
Creatinine, Ser: 0.8 mg/dL (ref 0.61–1.24)
GFR calc Af Amer: >60 mL/min (ref >60)
GFR calc non Af Amer: >60 mL/min (ref >60)
Glucose, Bld: 96 mg/dL (ref 70–99)
Potassium: 3.7 mmol/L (ref 3.5–5.1)
Sodium: 136 mmol/L (ref 135–145)
Total Bilirubin: 0.6 mg/dL (ref 0.3–1.2)
Total Protein: 5.6 g/dL — ABNORMAL LOW (ref 6.5–8.1)

## 2019-08-11 LAB — CBC WITH DIFFERENTIAL/PLATELET
Abs Immature Granulocytes: 0.19 10*3/uL — ABNORMAL HIGH (ref 0.00–0.07)
Basophils Absolute: 0 10*3/uL (ref 0.0–0.1)
Basophils Relative: 0 %
Eosinophils Absolute: 0.1 10*3/uL (ref 0.0–0.5)
Eosinophils Relative: 1 %
HCT: 25.1 % — ABNORMAL LOW (ref 39.0–52.0)
Hemoglobin: 8 g/dL — ABNORMAL LOW (ref 13.0–17.0)
Immature Granulocytes: 1 %
Lymphocytes Relative: 17 %
Lymphs Abs: 2.5 10*3/uL (ref 0.7–4.0)
MCH: 26.5 pg (ref 26.0–34.0)
MCHC: 31.9 g/dL (ref 30.0–36.0)
MCV: 83.1 fL (ref 80.0–100.0)
Monocytes Absolute: 1.3 10*3/uL — ABNORMAL HIGH (ref 0.1–1.0)
Monocytes Relative: 9 %
Neutro Abs: 10.7 10*3/uL — ABNORMAL HIGH (ref 1.7–7.7)
Neutrophils Relative %: 72 %
Platelets: 322 10*3/uL (ref 150–400)
RBC: 3.02 MIL/uL — ABNORMAL LOW (ref 4.22–5.81)
RDW: 21.3 % — ABNORMAL HIGH (ref 11.5–15.5)
WBC: 14.7 10*3/uL — ABNORMAL HIGH (ref 4.0–10.5)
nRBC: 0 % (ref 0.0–0.2)

## 2019-08-11 MED ORDER — ZOLPIDEM TARTRATE 5 MG PO TABS
5.0000 mg | ORAL_TABLET | Freq: Once | ORAL | Status: AC
  Administered 2019-08-11: 5 mg via ORAL
  Filled 2019-08-11: qty 1

## 2019-08-11 MED ORDER — AMOXICILLIN-POT CLAVULANATE 875-125 MG PO TABS
1.0000 | ORAL_TABLET | Freq: Two times a day (BID) | ORAL | Status: DC
  Administered 2019-08-11 – 2019-08-12 (×3): 1 via ORAL
  Filled 2019-08-11 (×3): qty 1

## 2019-08-11 NOTE — Progress Notes (Signed)
Hospitalist progress note   Calvin [name] [id number] DOB: 09-04-1960 DOA: 08/06/2019  PCP: Ladell Pier, MD   Narrative:   26 known history anxiety depression tobacco abuse HTN chronic back pain with myelopathy status post procedure 02/2018, possible osteomyelitis right toe in the past followed by ID, 7 mm polyp 2018 on scope Admitted with 3-week history of shortness of breath cough-year-old black male unintentional 20 pound weight loss  Found to have sepsis on arrival to emergency room T-max 95.5 WBC 20 lactic acid 8 Also found to have microcytic anemia hemoglobin 6.3 MCV 78--iron levels 5 saturation not calculated ferritin 377  CT of chest shows large multilocular cystic air-fluid collection within the right upper lung into the right middle lobe and small cystic cavitary lesion?  Multifocal cavitary lung abscess due to bacterial fungal infection or TB  HIV negative, C. difficile GI pathogen panel pending, fecal occult negative and he has had brown stool throughout the time that he has been ill over the past 3 weeks  Patient transfused 2 units PRBC  White count down from 20-->14.5 Transfused on admission 2 units PRBC from 8.3 BUN/creatinine 12/0.9-->8/0.95 Calcium 3.3-->3.8  Assessment & Plan: Sepsis secondary to cavitary pneumonia rule out TB Wbc up low-grade fevers- R/o TB with 3 am sputums D/W with Dr. Megan Salon 1/15 -quant gold neg-clinically improved-d/c TB airborne as Aspiration illness more likely Continue broad-spectrum Unasyn--transition to Augmentin 1/16 Blood culture + urine culture negative Lactic acid resolved Diarrhea on admission Looks like it resolved this no cultures or pathogen panel were done-discontinue contact no further diarrhea Dental Caries Follows with [facility] as OP on 08/23/2019--as not emergent, should follow with them in OP setting for extractions Microcytic anemia-possibly nutritional or chronic disease Received 1U PRBC 1/12, IV iron  1/13 Hemoglobin drifting down and probably will improve once nutritional status improves drastically Recheck periodically Mild hypokalemia CKD stage I Replaced  Scd, Full code, inpatient patient wishes to go home after treatment despite recs for PT/OT  Subjective: Overall much improved No new issues bathing himself moving around, still a little warm to touch Note that fever curve seems to be down  Consultants:   Telephone consulted Dr. Tamala Julian of [facility]  CVTS Dr. Darcey Nora Procedures:    Antimicrobials:   Unasyn transition to Augmentin 1/16  Objective: Vitals:   08/10/19 0900 08/10/19 1752 08/10/19 2036 08/10/19 2300  BP:  (!) 128/94 129/87 124/86  Pulse: 98 (!) 113 (!) 105 100  Resp: 15 20 20 18   Temp:  99.9 F (37.7 C) 98.2 F (36.8 C) 98.2 F (36.8 C)  TempSrc:  Oral Oral Oral  SpO2: 99% 100% 98% 98%  Weight:      Height:        Intake/Output Summary (Last 24 hours) at 08/11/2019 0736 Last data filed at 08/10/2019 2100 Gross per 24 hour  Intake --  Output 300 ml  Net -300 ml   Filed Weights   08/06/19 1848 08/08/19 0530  Weight: 45.4 kg 58.9 kg     Examination: Awake pleasant no distress EOMI NCAT asthenic Poor dentition on the right side S1-S2 no murmur rub or gallop slightly tachycardic-not on telemetry monitors Abdomen soft no rebound no guarding Neurologically intact  Scheduled Meds: . nicotine  7 mg Transdermal Daily  . potassium chloride  20 mEq Oral Daily   Continuous Infusions: . ampicillin-sulbactam (UNASYN) IV 3 g (08/11/19 0348)     LOS: 5 days   Time spent: [place], MD [facility]  08/11/2019, 7:36 AM

## 2019-08-11 NOTE — Progress Notes (Addendum)
Patient sister, Carlisle Cater, called concerned about patient returning to his home. She states patient can not walk well alone and uses a urinal at home. She also states the dwelling patient lives in has mold and needs condemned. She expressed her concerned for his health and how he will care for himself.   The sister would like to speak with social worker and physician about these concerns.

## 2019-08-12 LAB — BASIC METABOLIC PANEL
Anion gap: 9 (ref 5–15)
BUN: 16 mg/dL (ref 6–20)
CO2: 23 mmol/L (ref 22–32)
Calcium: 7.8 mg/dL — ABNORMAL LOW (ref 8.9–10.3)
Chloride: 103 mmol/L (ref 98–111)
Creatinine, Ser: 0.8 mg/dL (ref 0.61–1.24)
GFR calc Af Amer: >60 mL/min (ref >60)
GFR calc non Af Amer: >60 mL/min (ref >60)
Glucose, Bld: 114 mg/dL — ABNORMAL HIGH (ref 70–99)
Potassium: 4.1 mmol/L (ref 3.5–5.1)
Sodium: 135 mmol/L (ref 135–145)

## 2019-08-12 LAB — CBC WITH DIFFERENTIAL/PLATELET
Abs Immature Granulocytes: 0.17 10*3/uL — ABNORMAL HIGH (ref 0.00–0.07)
Basophils Absolute: 0.1 10*3/uL (ref 0.0–0.1)
Basophils Relative: 1 %
Eosinophils Absolute: 0 10*3/uL (ref 0.0–0.5)
Eosinophils Relative: 0 %
HCT: 27.4 % — ABNORMAL LOW (ref 39.0–52.0)
Hemoglobin: 8.6 g/dL — ABNORMAL LOW (ref 13.0–17.0)
Immature Granulocytes: 1 %
Lymphocytes Relative: 18 %
Lymphs Abs: 3 10*3/uL (ref 0.7–4.0)
MCH: 26.2 pg (ref 26.0–34.0)
MCHC: 31.4 g/dL (ref 30.0–36.0)
MCV: 83.5 fL (ref 80.0–100.0)
Monocytes Absolute: 1.4 10*3/uL — ABNORMAL HIGH (ref 0.1–1.0)
Monocytes Relative: 9 %
Neutro Abs: 12.1 10*3/uL — ABNORMAL HIGH (ref 1.7–7.7)
Neutrophils Relative %: 71 %
Platelets: 376 10*3/uL (ref 150–400)
RBC: 3.28 MIL/uL — ABNORMAL LOW (ref 4.22–5.81)
RDW: 22.2 % — ABNORMAL HIGH (ref 11.5–15.5)
WBC: 16.8 10*3/uL — ABNORMAL HIGH (ref 4.0–10.5)
nRBC: 0 % (ref 0.0–0.2)

## 2019-08-12 MED ORDER — AMOXICILLIN-POT CLAVULANATE 875-125 MG PO TABS: 1.0000 | ORAL_TABLET | Freq: Two times a day (BID) | ORAL | 0 refills | Status: AC

## 2019-08-12 MED ORDER — NICOTINE 7 MG/24HR TD PT24: 7.0000 mg | MEDICATED_PATCH | Freq: Every day | TRANSDERMAL | 0 refills | Status: DC

## 2019-08-12 NOTE — TOC Transition Note (Signed)
Transition of Care [facility]) - CM/SW Discharge Note   Patient Details  Name: Braxson Brandsma MRN: [id number] Date of Birth: 05-Sep-1960  Transition of Care [facility]) CM/SW Contact:  Carles Collet, RN Phone Number: 08/12/2019, 10:36 AM   Clinical Narrative:    Requested CMA to call [facility] to schedule follow up and notify patient. Confirmed w MD that he has follow up for dentist. Rx will be printed as [place] is closed over holiday weekend. [facility] notified that he is DC today. Patient states that his sister is picking him up and she is aware that he is going home with home health (Patient previously refused CM permission to discuss DC plan w sister).       Barriers to Discharge: Continued Medical Work up   Patient Goals and CMS Choice Patient states their goals for this hospitalization and ongoing recovery are:: patient wants to go home CMS Medicare.gov Compare Post Acute Care list provided to:: Patient Choice offered to / list presented to : Patient  Discharge Placement                       Discharge Plan and Services   Discharge Planning Services: CM Consult Post Acute Care Choice: Home Health                    HH Arranged: RN [place]: [facility], [employer] Date [place]: 08/09/19 Time [place]: [id number] Representative spoke with at [place]: Jefm Petty  Social Determinants of Health ([place]) Interventions     Readmission Risk Interventions No flowsheet data found.

## 2019-08-12 NOTE — Plan of Care (Signed)
°  Problem: Activity: °Goal: Ability to tolerate increased activity will improve °Outcome: Progressing °  °Problem: Respiratory: °Goal: Ability to maintain adequate ventilation will improve °Outcome: Progressing °Goal: Ability to maintain a clear airway will improve °Outcome: Progressing °  °

## 2019-08-12 NOTE — Discharge Summary (Signed)
Physician Discharge Summary  Calvin [name] [id number] DOB: 09-20-1960 DOA: 08/06/2019  PCP: Ladell Pier, MD  Admit date: 08/06/2019 Discharge date: 08/12/2019  Time spent: 30 minutes 1 second  Recommendations for Outpatient Follow-up:  1. Complete Augmentin twice daily 2. Needs probably a clearance of his chest x-ray and a repeat x-ray in about 3 weeks to ensure clearing of the abscess in the lung 3. Needs labs CBC and Chem-12 in the outpatient setting 4. Please follow-up as an outpatient for TB test on the sputum that was done 5. Needs further work-up for anemia probably needs colonoscopy if not done in the outpatient setting likely was nutritional  Discharge Diagnoses:  Principal Problem:   Lobar pneumonia ([place]) Active Problems:   Tobacco abuse   Severe sepsis ([place])   Diarrhea   Anemia Improved  Discharge Condition: Improved  Diet recommendation: Heart healthy  Filed Weights   08/06/19 1848 08/08/19 0530  Weight: 45.4 kg 58.9 kg    History of present illness:  58known history anxiety depression tobacco abuse HTN chronic back pain with myelopathy status post procedure 02/2018, possible osteomyelitis right toe in the past followed by ID, 7 mm polyp 2018 on scope Admitted with 3-week history of shortness of breath cough-year-old black male unintentional 20 pound weight loss  Found to have sepsis on arrival to emergency room T-max 95.5 WBC 20 lactic acid 8 Also found to have microcytic anemia hemoglobin 6.3 MCV 78--iron levels 5 saturation not calculated ferritin 377  CT of chest shows large multilocular cystic air-fluid collection within the right upper lung into the right middle lobe and small cystic cavitary lesion? Multifocal cavitary lung abscess due to bacterial fungal infection or TB  HIV negative, C. difficile GI pathogen panel pending, fecal occult negativeand he has had brown stool throughout the time that he has been ill over the past 3 weeks  Patient  transfused2 units PRBC  Hospital Course:  Sepsis secondary to cavitary pneumonia rule out TB Wbc up low-grade fevers- R/o TB with 3 am sputums D/W with Dr. Megan Salon 1/15 -quant gold neg-clinically improved-d/c TB airborne as Aspiration illness more likely Continue broad-spectrum Unasyn--transition to Augmentin 1/16-completion date in 3 weeks on 08/27/2019 Blood culture + urine culture negative Lactic acid resolved Diarrhea on admission Looks like it resolved this no cultures or pathogen panel were done-discontinue contact no further diarrhea Dental Caries Follows with [facility] on 08/23/2019--as not emergent, should follow with them in OP setting for extractions Microcytic anemia-possibly nutritional or chronic disease Received 1U PRBC 1/12, IV iron 1/13 Hemoglobin drifting down and probably will improve once nutritional status improves drastically Recheck periodically Mild hypokalemia CKD stage I Replaced   CVTS was consulted Panorex was performed showing caries at the teeth likely etiology  Discharge Exam: Vitals:   08/12/19 0600 08/12/19 0941  BP: (!) 119/91 123/87  Pulse:  98  Resp: 18 18  Temp: 98 F (36.7 C) 98.4 F (36.9 C)  SpO2: 97% 100%    General: Awake alert coherent no distress weak but overall feels much better sputum has decreased and patient seems more with Cardiovascular: S1-S2 no murmur rub or gallop Respiratory: Clinically clear no added sound Abdomen soft nontender no rebound or guarding Neurologically intact moving all 4 limbs equally but very asthenic  Discharge Instructions   Discharge Instructions    Diet - low sodium heart healthy   Complete by: As directed    Discharge instructions   Complete by: As directed  Make sure that you complete all of your medications for the abscess in your lung-would recommend that you complete this on 08/27/2019 Resume your blood pressure meds hydrochlorothiazide but do not take any further Norco You  can resume also taking nicotine patch if you so desire it would be beneficial if you stop smoking as this will help your lungs heal Use a walker to ensure that you are able to take care of your feet Please follow-up with Dr. [facility] in the outpatient setting to remove the teeth that may have caused your caries   Increase activity slowly   Complete by: As directed      Allergies as of 08/12/2019   No Known Allergies     Medication List    STOP taking these medications   HYDROcodone-acetaminophen 10-325 MG tablet Commonly known as: NORCO   HYDROcodone-acetaminophen 5-325 MG tablet Commonly known as: NORCO/VICODIN   multivitamin tablet     TAKE these medications   amLODipine 10 MG tablet Commonly known as: NORVASC Take 1 tablet (10 mg total) by mouth daily.   amoxicillin-clavulanate 875-125 MG tablet Commonly known as: AUGMENTIN Take 1 tablet by mouth every 12 (twelve) hours for 15 days.   cyclobenzaprine 5 MG tablet Commonly known as: FLEXERIL TAKE 1 TABLET (5 MG TOTAL) BY MOUTH 2 (TWO) TIMES DAILY AS NEEDED FOR MUSCLE SPASMS.   hydrochlorothiazide 25 MG tablet Commonly known as: HYDRODIURIL Take 1 tablet (25 mg total) by mouth daily.   nicotine 7 mg/24hr patch Commonly known as: NICODERM CQ - dosed in mg/24 hr Place 1 patch (7 mg total) onto the skin daily. Start taking on: August 13, 2019   sertraline 50 MG tablet Commonly known as: ZOLOFT TAKE 1 TABLET BY MOUTH DAILY.   sildenafil 100 MG tablet Commonly known as: Viagra 1 tab PO 1/2 hr before intercourse PRN.  Limit use to 1 tab /24 hr period.      No Known Allergies    The results of significant diagnostics from this hospitalization (including imaging, microbiology, ancillary and laboratory) are listed below for reference.    Significant Diagnostic Studies: DG Orthopantogram  Result Date: 08/09/2019 CLINICAL DATA:  Lung abscess.  Assess for dental disease EXAM: ORTHOPANTOGRAM/PANORAMIC COMPARISON:   None. FINDINGS: Multiple mandibular and maxillary teeth missing. Lucency noted around the remaining right upper molar compatible with periapical abscess as well as dental caries within the right upper molar. Less pronounced lucency adjacent to a left lower molar, likely early periapical abscess. IMPRESSION: Periapical abscess around the long remaining right upper molar as well as early periapical abscess suspected adjacent to a left lower molar. Electronically Signed   By: Rolm Baptise M.D.   On: 08/09/2019 19:05   CT CHEST W CONTRAST  Result Date: 08/07/2019 CLINICAL DATA:  Cavitary lung lesion, generalized weakness and productive cough EXAM: CT CHEST WITH CONTRAST TECHNIQUE: Multidetector CT imaging of the chest was performed during intravenous contrast administration. CONTRAST:  37mL OMNIPAQUE IOHEXOL 300 MG/ML  SOLN COMPARISON:  Radiograph same day FINDINGS: Cardiovascular: There are no significant vascular findings. The heart size is normal. There is no pericardial thickening or effusion. Mediastinum/Nodes: There are no enlarged mediastinal, hilar or axillary lymph nodes. The thyroid gland, trachea and esophagus demonstrate no significant findings. Lungs/Pleura: There is a large multilocular air and fluid-filled masslike collection within the right upper lobe which extends into the upper paratracheal soft tissues. There is thick peripheral enhancement seen in this multilocular collection with air-fluid levels. A probable centrally necrotic subcarinal  mass/lymph node is seen best on, series 3, image 77 measuring approximately 2.9 cm. There is a spiculated area of consolidation extending into the right middle lobe with pleural thickening. Small a cystic lesion with adjacent pleural thickening also noted in the posterior left upper lung. Upper abdomen:  Ascites seen within the upper abdomen. Musculoskeletal/Chest wall: There is no chest wall mass or suspicious osseous finding. No acute osseous abnormality  IMPRESSION: 1. Large multilocular cystic air and fluid collection/consolidation within the right upper lung extending into the right middle lobe. There is also a small cystic cavitary lesion with adjacent pleural thickening in the posterior left upper lobe. These findings could be suggestive of multifocal cavitary lung abscesses due to bacterial or fungal infection(such as TB). However cannot exclude underlying bronchogenic cavitary malignancy. 2.  centrally necrotic subcarinal adenopathy measuring 2.9 cm. 3. Small amount of abdominal ascites. Electronically Signed   By: Prudencio Pair M.D.   On: 08/07/2019 04:46   DG Chest Port 1 View  Result Date: 08/09/2019 CLINICAL DATA:  Pneumonia. EXAM: PORTABLE CHEST 1 VIEW COMPARISON:  August 06, 2019. FINDINGS: The heart size and mediastinal contours are within normal limits. Left lung is clear. No pneumothorax is noted. Large right upper lobe opacity is noted with large cavitary lesion concerning for cavitary pneumonia or possibly malignancy. No pleural effusion is noted. The visualized skeletal structures are unremarkable. IMPRESSION: Large right upper lobe opacity is noted with large cavitary lesion concerning for cavitary pneumonia or possibly malignancy. Electronically Signed   By: Marijo Conception M.D.   On: 08/09/2019 07:51   DG Chest Port 1 View  Result Date: 08/06/2019 CLINICAL DATA:  59 year old male with shortness of breath EXAM: PORTABLE CHEST 1 VIEW COMPARISON:  Chest radiograph dated 12/23/2018. FINDINGS: There is a large area of opacity in the right upper lobe with apparent area of cavitation, new since the prior radiograph. Findings may represent an infectious process such as fungal infection, abscess, or TB or bacterial pneumonia. The hilar mass is not excluded. The left lung is clear. No pleural effusion or pneumothorax. The cardiac silhouette is within normal limits. No acute osseous pathology. IMPRESSION: Large area of consolidative change in the  right upper lobe as described may represent pneumonia. Underlying mass is not excluded. If there is clinical findings of pneumonia follow-up after treatment recommended to document resolution. If there is no infection, further evaluation with chest CT with IV contrast is recommended. Electronically Signed   By: Anner Crete M.D.   On: 08/06/2019 19:08   ECHOCARDIOGRAM COMPLETE  Result Date: 08/07/2019   ECHOCARDIOGRAM REPORT   Patient Name:   Calvin [name]  Date of Exam: 08/07/2019 Medical Rec #:  [id number]  Height:       73.0 in Accession #:    [id number] Weight:       100.0 lb Date of Birth:  30-Sep-1960 BSA:          1.60 m Patient Age:    60 years   BP:           112/81 mmHg Patient Gender: M          HR:           92 bpm. Exam Location:  Inpatient Procedure: 2D Echo Indications:    Lung Infection; Endocarditis I38; Abnormal ECG R94.31  History:        Patient has no prior history of Echocardiogram examinations.  Risk Factors:Hypertension.  Sonographer:    Mikki Santee RDCS (AE) Referring Phys: [place]  1. Left ventricular ejection fraction, by visual estimation, is 60 to 65%. The left ventricle has normal function. There is no left ventricular hypertrophy.  2. The left ventricle has no regional wall motion abnormalities.  3. Global right ventricle has normal systolic function.The right ventricular size is normal. No increase in right ventricular wall thickness.  4. Left atrial size was normal.  5. Right atrial size was normal.  6. The mitral valve is normal in structure. No evidence of mitral valve regurgitation. No evidence of mitral stenosis.  7. The tricuspid valve is normal in structure.  8. The septal leaflet appears thickened, but no clear vegetation is seen.  9. The aortic valve is normal in structure. Aortic valve regurgitation is not visualized. No evidence of aortic valve sclerosis or stenosis. 10. The pulmonic valve was normal in structure. Pulmonic  valve regurgitation is not visualized. 11. Normal pulmonary artery systolic pressure. 12. The inferior vena cava is normal in size with greater than 50% respiratory variability, suggesting right atrial pressure of 3 mmHg. 13. No definite vegetations are seen, although the septal leaflet of the tricuspid valve appears abnormally thickened. Consider TEE if endocarditis is suspected and TEE findings would change clinical management. FINDINGS  Left Ventricle: Left ventricular ejection fraction, by visual estimation, is 60 to 65%. The left ventricle has normal function. The left ventricle has no regional wall motion abnormalities. There is no left ventricular hypertrophy. Left ventricular diastolic parameters were normal. Normal left atrial pressure. Right Ventricle: The right ventricular size is normal. No increase in right ventricular wall thickness. Global RV systolic function is has normal systolic function. The tricuspid regurgitant velocity is 1.93 m/s, and with an assumed right atrial pressure  of 3 mmHg, the estimated right ventricular systolic pressure is normal at 17.9 mmHg. Left Atrium: Left atrial size was normal in size. Right Atrium: Right atrial size was normal in size Pericardium: There is no evidence of pericardial effusion. Mitral Valve: The mitral valve is normal in structure. No evidence of mitral valve regurgitation. No evidence of mitral valve stenosis by observation. Tricuspid Valve: The tricuspid valve is normal in structure. Tricuspid valve regurgitation is trivial. The septal leaflet appears thickened, but no clear vegetation is seen. Aortic Valve: The aortic valve is normal in structure. Aortic valve regurgitation is not visualized. The aortic valve is structurally normal, with no evidence of sclerosis or stenosis. Pulmonic Valve: The pulmonic valve was normal in structure. Pulmonic valve regurgitation is not visualized. Pulmonic regurgitation is not visualized. Aorta: The aortic root,  ascending aorta and aortic arch are all structurally normal, with no evidence of dilitation or obstruction. Venous: The inferior vena cava is normal in size with greater than 50% respiratory variability, suggesting right atrial pressure of 3 mmHg. IAS/Shunts: No atrial level shunt detected by color flow Doppler. There is no evidence of a patent foramen ovale. No ventricular septal defect is seen or detected. There is no evidence of an atrial septal defect. Additional Comments: No definite vegetations are seen, although the septal leaflet of the tricuspid valve appears abnormally thickened. Consider TEE if endocarditis is suspected and TEE findings would change clinical management.  LEFT VENTRICLE PLAX 2D LVIDd:         4.40 cm  Diastology LVIDs:         3.00 cm  LV e' lateral:   11.60 cm/s LV PW:  1.00 cm  LV E/e' lateral: 7.9 LV IVS:        0.90 cm  LV e' medial:    9.46 cm/s LVOT diam:     2.30 cm  LV E/e' medial:  9.6 LV SV:         53 ml LV SV Index:   35.21 LVOT Area:     4.15 cm  RIGHT VENTRICLE RV S prime:     12.90 cm/s TAPSE (M-mode): 1.7 cm LEFT ATRIUM           Index       RIGHT ATRIUM           Index LA diam:      2.40 cm 1.50 cm/m  RA Area:     11.70 cm LA Vol (A4C): 31.0 ml 19.34 ml/m RA Volume:   24.20 ml  15.10 ml/m  AORTIC VALVE LVOT Vmax:   85.80 cm/s LVOT Vmean:  52.700 cm/s LVOT VTI:    0.137 m  AORTA Ao Root diam: 3.00 cm MITRAL VALVE                        TRICUSPID VALVE MV Area (PHT): 2.54 cm             TR Peak grad:   14.9 mmHg MV PHT:        86.71 msec           TR Vmax:        193.00 cm/s MV Decel Time: 299 msec MV E velocity: 91.20 cm/s 103 cm/s  SHUNTS MV A velocity: 69.20 cm/s 70.3 cm/s Systemic VTI:  0.14 m MV E/A ratio:  1.32       1.5       Systemic Diam: 2.30 cm  Dani Gobble Croitoru MD Electronically signed by Sanda Klein MD Signature Date/Time: 08/07/2019/6:14:39 PM    Final     Microbiology: Recent Results (from the past 240 hour(s))  Blood Culture (routine x 2)      Status: None   Collection Time: 08/06/19  9:18 PM   Specimen: BLOOD LEFT FOREARM  Result Value Ref Range Status   Specimen Description BLOOD LEFT FOREARM  Final   Special Requests   Final    BOTTLES DRAWN AEROBIC AND ANAEROBIC Blood Culture results may not be optimal due to an inadequate volume of blood received in culture bottles   Culture   Final    NO GROWTH 5 DAYS Performed at [facility], [address], [place], [place] [address]    Report Status 08/11/2019 FINAL  Final  Respiratory Panel by RT PCR (Flu A&B, Covid) - Nasopharyngeal Swab     Status: None   Collection Time: 08/06/19  9:40 PM   Specimen: Nasopharyngeal Swab  Result Value Ref Range Status   SARS Coronavirus 2 by RT PCR NEGATIVE NEGATIVE Final    Comment: (NOTE) SARS-CoV-2 target nucleic acids are NOT DETECTED. The SARS-CoV-2 RNA is generally detectable in upper respiratoy specimens during the acute phase of infection. The lowest concentration of SARS-CoV-2 viral copies this assay can detect is 131 copies/mL. A negative result does not preclude SARS-Cov-2 infection and should not be used as the sole basis for treatment or other patient management decisions. A negative result may occur with  improper specimen collection/handling, submission of specimen other than nasopharyngeal swab, presence of viral mutation(s) within the areas targeted by this assay, and inadequate number of viral copies (<131 copies/mL).  A negative result must be combined with clinical observations, patient history, and epidemiological information. The expected result is Negative. Fact Sheet for Patients:  PinkCheek.be Fact Sheet for Healthcare Providers:  GravelBags.it This test is not yet ap proved or cleared by the [place] FDA and  has been authorized for detection and/or diagnosis of SARS-CoV-2 by FDA under an Emergency Use Authorization (EUA). This EUA will remain   in effect (meaning this test can be used) for the duration of the COVID-19 declaration under Section 564(b)(1) of the Act, 21 U.S.C. section 360bbb-3(b)(1), unless the authorization is terminated or revoked sooner.    Influenza A by PCR NEGATIVE NEGATIVE Final   Influenza B by PCR NEGATIVE NEGATIVE Final    Comment: (NOTE) The Xpert Xpress SARS-CoV-2/FLU/RSV assay is intended as an aid in  the diagnosis of influenza from Nasopharyngeal swab specimens and  should not be used as a sole basis for treatment. Nasal washings and  aspirates are unacceptable for Xpert Xpress SARS-CoV-2/FLU/RSV  testing. Fact Sheet for Patients: PinkCheek.be Fact Sheet for Healthcare Providers: GravelBags.it This test is not yet approved or cleared by the [place] FDA and  has been authorized for detection and/or diagnosis of SARS-CoV-2 by  FDA under an Emergency Use Authorization (EUA). This EUA will remain  in effect (meaning this test can be used) for the duration of the  Covid-19 declaration under Section 564(b)(1) of the Act, 21  U.S.C. section 360bbb-3(b)(1), unless the authorization is  terminated or revoked. Performed at [facility], [place] [address], [place], [place] [address]   Urine culture     Status: None   Collection Time: 08/07/19  4:38 AM   Specimen: In/Out Cath Urine  Result Value Ref Range Status   Specimen Description IN/OUT CATH URINE  Final   Special Requests NONE  Final   Culture   Final    NO GROWTH Performed at [facility], [place] [address], [place], [place] [address]    Report Status 08/08/2019 FINAL  Final  Expectorated sputum assessment w rflx to resp cult     Status: None   Collection Time: 08/07/19  4:39 AM   Specimen: Expectorated Sputum  Result Value Ref Range Status   Specimen Description SPUTUM  Final   Special Requests NONE  Final   Sputum evaluation   Final    THIS SPECIMEN IS ACCEPTABLE  FOR SPUTUM CULTURE Performed at [facility], [place] [address], [place], [place] [address]    Report Status 08/07/2019 FINAL  Final  Culture, respiratory     Status: None   Collection Time: 08/07/19  4:39 AM   Specimen: SPU  Result Value Ref Range Status   Specimen Description SPUTUM  Final   Special Requests NONE Reflexed from [id number]  Final   Gram Stain   Final    FEW WBC PRESENT,BOTH PMN AND MONONUCLEAR RARE BUDDING YEAST SEEN RARE GRAM VARIABLE ROD    Culture   Final    FEW Consistent with normal respiratory flora. Performed at [facility], [place] [address] [place],  [address]    Report Status 08/09/2019 FINAL  Final  MRSA PCR Screening     Status: None   Collection Time: 08/08/19  6:29 AM   Specimen: Nasopharyngeal  Result Value Ref Range Status   MRSA by PCR NEGATIVE NEGATIVE Final    Comment:        The GeneXpert MRSA Assay (FDA approved for NASAL specimens only),  is one component of a comprehensive MRSA colonization surveillance program. It is not intended to diagnose MRSA infection nor to guide or monitor treatment for MRSA infections. Performed at [facility], [place] [address], [place], [place] [address]   Acid Fast Smear (AFB)     Status: None   Collection Time: 08/09/19  5:55 PM  Result Value Ref Range Status   AFB Specimen Processing Concentration  Final   Acid Fast Smear Negative  Final    Comment: (NOTE) Performed At: [facility] [place], [place] [id number] Rush Farmer MD [id number]    Source (AFB) SPUTUM  Final    Comment: Performed at [facility], [place] [address], [place], [place] [address]     Labs: Basic Metabolic Panel: Recent Labs  Lab 08/07/19 [phone] 08/09/19 0146 08/10/19 0449 08/11/19 0537 08/12/19 0524  NA 134* 134* 135 136 135  K 3.3* 3.8 4.0 3.7 4.1  CL 100 102 100 105 103  CO2 22 24 26 24 23   GLUCOSE 107* 104* 107* 96 114*  BUN 14 12 8 11 16   CREATININE 1.15 0.91 0.95  0.80 0.80  CALCIUM 7.2* 7.2* 7.7* 7.6* 7.8*  PHOS  --   --  1.9*  --   --    Liver Function Tests: Recent Labs  Lab 08/06/19 1831 08/07/19 0832 08/10/19 0449 08/11/19 0537  AST 25 19  --  22  ALT <5 18  --  16  ALKPHOS 193* 166*  --  115  BILITOT 0.5 0.5  --  0.6  PROT 6.0* 5.2*  --  5.6*  ALBUMIN 1.4* 1.3* 1.3* 1.3*   No results for input(s): LIPASE, AMYLASE in the last 168 hours. No results for input(s): AMMONIA in the last 168 hours. CBC: Recent Labs  Lab 08/07/19 0832 08/09/19 0146 08/10/19 0449 08/11/19 0537 08/12/19 0524  WBC 12.8* 8.5 14.6* 14.7* 16.8*  NEUTROABS 9.5* 5.1 11.2* 10.7* 12.1*  HGB 8.2* 7.4* 8.3* 8.0* 8.6*  HCT 26.3* 23.1* 26.3* 25.1* 27.4*  MCV 82.2 79.4* 80.9 83.1 83.5  PLT 333 269 316 322 376   Cardiac Enzymes: Recent Labs  Lab 08/06/19 1831  CKTOTAL 25*   BNP: BNP (last 3 results) No results for input(s): BNP in the last 8760 hours.  ProBNP (last 3 results) No results for input(s): PROBNP in the last 8760 hours.  CBG: No results for input(s): GLUCAP in the last 168 hours.     Signed:  Nita Sells MD   [facility] 08/12/2019, 10:04 AM

## 2019-08-13 ENCOUNTER — Telehealth: Payer: Self-pay

## 2019-08-13 NOTE — Telephone Encounter (Signed)
Transition Care Management Follow-up Telephone Call Date of discharge and from where: 08/12/2019, [facility]   Call placed to # [phone], message left with call back requested to this CM  Call placed to # [phone], the message stated that the call cannot be completed at this time   Patient has an appointment with Dr Wynetta Emery 08/27/2019

## 2019-08-14 ENCOUNTER — Telehealth: Payer: Self-pay

## 2019-08-14 NOTE — Telephone Encounter (Signed)
Transition Care Management Follow-up Telephone Call Date of discharge and from where: 08/12/2019, [facility]   Call placed to # [phone] and the message states that the call cannot be completed at this time.   Call placed to # [phone] , message left with call back requested to this CM.  The patient has an appointment with Dr Wynetta Emery 08/27/2019

## 2019-08-15 ENCOUNTER — Telehealth: Payer: Self-pay | Admitting: Internal Medicine

## 2019-08-15 NOTE — Telephone Encounter (Signed)
Patients sister stated that the patient is currently having to wear depends and wanted a script for them to be sent through active stylesfor delivery. Patients sister stated that he has been getting a little forgetful lately and is having trouble remembering if he has taken his meds.

## 2019-08-17 NOTE — Telephone Encounter (Signed)
Will forward to pcp

## 2019-08-21 NOTE — Telephone Encounter (Signed)
Tried contacting pt to more information pt didn't answer lvm asking pt to give me a call back at his earliest convenience

## 2019-08-27 ENCOUNTER — Inpatient Hospital Stay: Payer: Medicare Other | Admitting: Internal Medicine

## 2019-09-04 NOTE — Progress Notes (Signed)
Patient ID: Calvin [name], male   DOB: 06-16-1961, 59 y.o.   MRN: [id number]   Calvin [name], is a 59 y.o. male  [id number]  [id number]  DOB - 1961/04/10  Subjective:  Chief Complaint and HPI: Calvin [name] is a 59 y.o. male here today tfor a follow up visit After hospitalization 1/11-1/17/2021 for sepsis, lobar pneumonia, and anemia.  Colonoscopy in 2018 but didn't get accurate results bc not appropriately "cleaned out."  He is calling GI to schedule an appt.    Finished antibiotics.  Feeling much better but still a little weaker than prior to hospital admission.  No fever.  No N/V/D.  No longer needs supplies for incontinence.    See hospital summaries below:  From discharge summary: Recommendations for Outpatient Follow-up:  1. Complete Augmentin twice daily 2. Needs probably a clearance of his chest x-ray and a repeat x-ray in about 3 weeks to ensure clearing of the abscess in the lung 3. Needs labs CBC and Chem-12 in the outpatient setting 4. Please follow-up as an outpatient for TB test on the sputum that was done 5. Needs further work-up for anemia probably needs colonoscopy if not done in the outpatient setting likely was nutritional  Discharge Diagnoses:  Principal Problem:   Lobar pneumonia ([place]) Active Problems:   Tobacco abuse   Severe sepsis ([place])   Diarrhea   Anemia  Hospital Course:  Sepsis secondary to cavitary pneumonia rule out TB Wbc up low-grade fevers- R/o TB with 3 am sputums D/W with Dr. Megan Salon 1/15-quant gold neg-clinically improved-d/c TB airborne as Aspiration illness more likely Continue broad-spectrum Unasyn--transition to Augmentin 1/16-completion date in 3 weeks on 08/27/2019 Blood culture + urine culture negative Lactic acid resolved Diarrhea on admission Looks like it resolved this no cultures or pathogen panel were done-discontinue contact no further diarrhea Dental Caries Follows [facility] 08/23/2019--as not emergent, should  follow with them in OP setting for extractions Microcytic anemia-possibly nutritional or chronic disease Received 1U PRBC 1/12, IV iron 1/13 Hemoglobin drifting down and probably will improve once nutritional status improves drastically Recheck periodically Mild hypokalemia CKD stage I Replaced   CVTS was consulted Panorex was performed showing caries at the teeth likely etiology   ED/Hospital notes reviewed.    ROS:   Constitutional:  No f/c, No night sweats, No unexplained weight loss. EENT:  No vision changes, No blurry vision, No hearing changes. No mouth, throat, or ear problems.  Respiratory: No cough, No SOB Cardiac: No CP, no palpitations GI:  No abd pain, No N/V/D. GU: No Urinary s/sx Musculoskeletal: No joint pain Neuro: No headache, no dizziness, no motor weakness.  Skin: No rash Endocrine:  No polydipsia. No polyuria.  Psych: Denies SI/HI  No problems updated.  ALLERGIES: No Known Allergies  PAST MEDICAL HISTORY: Past Medical History:  Diagnosis Date  . Anxiety   . Arthritis    back, shoulders   . Constipation   . Depression   . Disc disorder   . Hypertension   . Lumbar herniated disc   . Muscle spasm     MEDICATIONS AT HOME: Prior to Admission medications   Medication Sig Start Date End Date Taking? Authorizing Provider  amLODipine (NORVASC) 10 MG tablet Take 1 tablet (10 mg total) by mouth daily. 01/05/19  Yes Ladell Pier, MD  hydrochlorothiazide (HYDRODIURIL) 25 MG tablet Take 1 tablet (25 mg total) by mouth daily. 01/05/19  Yes Ladell Pier, MD  sertraline (ZOLOFT) 50 MG tablet  TAKE 1 TABLET BY MOUTH DAILY. 05/07/19  Yes Ladell Pier, MD  cyclobenzaprine (FLEXERIL) 5 MG tablet Take 1 tablet (5 mg total) by mouth at bedtime. Prn muscle spasm 09/05/19   Freeman Caldron M, PA-C  nicotine (NICODERM CQ - DOSED IN MG/24 HR) 7 mg/24hr patch Place 1 patch (7 mg total) onto the skin daily. Patient not taking: Reported on 09/05/2019  08/13/19   Nita Sells, MD  sildenafil (VIAGRA) 100 MG tablet 1 tab PO 1/2 hr before intercourse PRN.  Limit use to 1 tab /24 hr period. Patient not taking: Reported on 09/05/2019 09/21/17   Ladell Pier, MD     Objective:  EXAM:   Vitals:   09/05/19 1342  BP: 121/85  Pulse: 90  Resp: 16  Temp: 98 F (36.7 C)  SpO2: 98%  Weight: 127 lb (57.6 kg)  Height: 6\' 1"  (1.854 m)    General appearance : A&OX3. NAD. Non-toxic-appearing; thin, walks with cane HEENT: Atraumatic and Normocephalic.  PERRLA. EOM intact.  Neck: supple, no JVD. No cervical lymphadenopathy. No thyromegaly Chest/Lungs:  Breathing-non-labored, Good air entry bilaterally, breath sounds normal without rales, rhonchi, or wheezing  CVS: S1 S2 regular, no murmurs, gallops, rubs  Extremities: Bilateral Lower Ext shows no edema, both legs are warm to touch with = pulse throughout Neurology:  CN II-XII grossly intact, Non focal.   Psych:  TP linear. J/I WNL. Normal speech. Appropriate eye contact and affect.  Skin:  No Rash  Data Review Lab Results  Component Value Date   HGBA1C 5.6 07/06/2016   HGBA1C CANCELED 12/01/2015     Assessment & Plan   1. Leukocytosis, unspecified type Completed antibiotics - DG Chest 2 View; Future  2. Severe sepsis ([place]) Clinically continues to improved - DG Chest 2 View; Future - CBC with Differential/Platelet  3. Essential hypertension Controlled-continue current regimen  4. Anemia, unspecified type Schedule appt with GI.  Patient agrees - CBC with Differential/Platelet  5. Lumbar radiculopathy - cyclobenzaprine (FLEXERIL) 5 MG tablet; Take 1 tablet (5 mg total) by mouth at bedtime. Prn muscle spasm  Dispense: 30 tablet; Refill: 0  6. Cramp of both lower extremities Requested RF - cyclobenzaprine (FLEXERIL) 5 MG tablet; Take 1 tablet (5 mg total) by mouth at bedtime. Prn muscle spasm  Dispense: 30 tablet; Refill: 0  7. Hospital discharge follow-up  improving    Patient have been counseled extensively about nutrition and exercise  Return in about 3 months (around 12/03/2019) for PCP for chronic conditions.  The patient was given clear instructions to go to ER or return to medical center if symptoms don't improve, worsen or new problems develop. The patient verbalized understanding. The patient was told to call to get lab results if they haven't heard anything in the next week.     Freeman Caldron, PA-C [facility] and [facility], [place]   09/05/2019, 2:07 PM

## 2019-09-05 ENCOUNTER — Other Ambulatory Visit: Payer: Self-pay

## 2019-09-05 ENCOUNTER — Ambulatory Visit: Payer: Medicare Other | Attending: Family Medicine | Admitting: Physician Assistant

## 2019-09-05 VITALS — BP 121/85 | HR 90 | Temp 98.0°F | Resp 16 | Ht 73.0 in | Wt 127.0 lb

## 2019-09-05 DIAGNOSIS — D649 Anemia, unspecified: Secondary | ICD-10-CM

## 2019-09-05 DIAGNOSIS — D72829 Elevated white blood cell count, unspecified: Secondary | ICD-10-CM

## 2019-09-05 DIAGNOSIS — Z09 Encounter for follow-up examination after completed treatment for conditions other than malignant neoplasm: Secondary | ICD-10-CM | POA: Diagnosis not present

## 2019-09-05 DIAGNOSIS — R252 Cramp and spasm: Secondary | ICD-10-CM

## 2019-09-05 DIAGNOSIS — R6520 Severe sepsis without septic shock: Secondary | ICD-10-CM | POA: Diagnosis not present

## 2019-09-05 DIAGNOSIS — M5416 Radiculopathy, lumbar region: Secondary | ICD-10-CM

## 2019-09-05 DIAGNOSIS — A419 Sepsis, unspecified organism: Secondary | ICD-10-CM | POA: Diagnosis not present

## 2019-09-05 DIAGNOSIS — I10 Essential (primary) hypertension: Secondary | ICD-10-CM

## 2019-09-05 MED ORDER — CYCLOBENZAPRINE HCL 5 MG PO TABS: 5.0000 mg | ORAL_TABLET | Freq: Every day | ORAL | 0 refills | Status: DC

## 2019-09-05 NOTE — Patient Instructions (Signed)
Make an appt with your gastroenterologist for anemia.  Make appt with your orthopedist for hip pain

## 2019-09-05 NOTE — Progress Notes (Signed)
Hospital f /u  Requested an MRI referral for  L hip chronic pain Pain 8/10

## 2019-09-06 LAB — CBC WITH DIFFERENTIAL/PLATELET
Basophils Absolute: 0.1 10*3/uL (ref 0.0–0.2)
Basos: 1 %
EOS (ABSOLUTE): 0.2 10*3/uL (ref 0.0–0.4)
Eos: 2 %
Hematocrit: 35 % — ABNORMAL LOW (ref 37.5–51.0)
Hemoglobin: 11.2 g/dL — ABNORMAL LOW (ref 13.0–17.7)
Immature Grans (Abs): 0 10*3/uL (ref 0.0–0.1)
Immature Granulocytes: 0 %
Lymphocytes Absolute: 2.2 10*3/uL (ref 0.7–3.1)
Lymphs: 25 %
MCH: 27.7 pg (ref 26.6–33.0)
MCHC: 32 g/dL (ref 31.5–35.7)
MCV: 87 fL (ref 79–97)
Monocytes Absolute: 0.8 10*3/uL (ref 0.1–0.9)
Monocytes: 9 %
Neutrophils Absolute: 5.6 10*3/uL (ref 1.4–7.0)
Neutrophils: 63 %
Platelets: 310 10*3/uL (ref 150–450)
RBC: 4.04 x10E6/uL — ABNORMAL LOW (ref 4.14–5.80)
RDW: 20.7 % — ABNORMAL HIGH (ref 11.6–15.4)
WBC: 8.9 10*3/uL (ref 3.4–10.8)

## 2019-09-22 LAB — ACID FAST CULTURE WITH REFLEXED SENSITIVITIES (MYCOBACTERIA): Acid Fast Culture: NEGATIVE

## 2019-10-04 ENCOUNTER — Ambulatory Visit: Payer: Medicare Other | Admitting: Orthopedic Surgery

## 2019-10-10 ENCOUNTER — Ambulatory Visit (INDEPENDENT_AMBULATORY_CARE_PROVIDER_SITE_OTHER): Payer: Medicare Other | Admitting: Orthopedic Surgery

## 2019-10-10 ENCOUNTER — Ambulatory Visit: Payer: Self-pay

## 2019-10-10 ENCOUNTER — Other Ambulatory Visit: Payer: Self-pay

## 2019-10-10 DIAGNOSIS — S72112A Displaced fracture of greater trochanter of left femur, initial encounter for closed fracture: Secondary | ICD-10-CM

## 2019-10-10 DIAGNOSIS — M79605 Pain in left leg: Secondary | ICD-10-CM

## 2019-10-12 ENCOUNTER — Encounter: Payer: Self-pay | Admitting: Orthopedic Surgery

## 2019-10-12 NOTE — Progress Notes (Signed)
Office Visit Note   Patient: Calvin [name]           Date of Birth: Mar 22, 1961           MRN: [id number] Visit Date: 10/10/2019 Requested by: Ladell Pier, MD [place],  [place] [id number] PCP: Ladell Pier, MD  Subjective: Chief Complaint  Patient presents with  . Left Hip - Pain    HPI: Calvin [name] is a patient with back and left hip pain.'s been going on now for many months.  Had a greater trochanteric fracture from which she has not really recovered very well from.  Hurts him to weight-bear on the left-hand side.  Describes foot swelling.  Describes back pain as well as radicular pain as well and some weakness.  He is using a cane.  Unable to do physical therapy due to the pain.              ROS: All systems reviewed are negative as they relate to the chief complaint within the history of present illness.  Patient denies  fevers or chills.   Assessment & Plan: Visit Diagnoses:  1. Pain in left leg   2. Closed avulsion fracture of greater trochanter of left femur, initial encounter ([place])     Plan: Impression is back pain and left hip pain with radiographs which do not really tell the whole story.'s been going on now for several months.  Trochanteric fracture has healed.  Needs MRI scan left hip to evaluate fracture healing and possible other abnormalities in the joint itself.  Also needs MRI of the lumbar spine to evaluate left-sided radiculopathy with leg weakness.  Follow-Up Instructions: Return for after MRI.   Orders:  Orders Placed This Encounter  Procedures  . XR HIP UNILAT W OR W/O PELVIS 2-3 VIEWS LEFT  . XR Lumbar Spine 2-3 Views   No orders of the defined types were placed in this encounter.     Procedures: No procedures performed   Clinical Data: No additional findings.  Objective: Vital Signs: There were no vitals taken for this visit.  Physical Exam:   Constitutional: Patient appears well-developed HEENT:  Head:  Normocephalic Eyes:EOM are normal Neck: Normal range of motion Cardiovascular: Normal rate Pulmonary/chest: Effort normal Neurologic: Patient is alert Skin: Skin is warm Psychiatric: Patient has normal mood and affect    Ortho Exam: Ortho exam demonstrates antalgic gait to the left but is not Trendelenburg type gait.  He is using a cane.  Has mild atrophy left leg versus right as well as some edema bilaterally.  Pulses palpable.  Negative clonus.  Reflexes symmetric 1+ out of 4 bilateral Achilles and patella.  Not much in the way of groin pain with internal X rotation of the leg but he does have some weakness in hip flexion on the left compared to the right.  Some pain with forward lateral bending but no discrete trochanteric tenderness.  No masses lymphadenopathy or skin changes noted in the back or hip region bilaterally.  Specialty Comments:  No specialty comments available.  Imaging: No results found.   PMFS History: Patient Active Problem List   Diagnosis Date Noted  . Severe sepsis ([place]) 08/07/2019  . Lobar pneumonia ([place]) 08/07/2019  . Diarrhea 08/07/2019  . Anemia 08/07/2019  . Cervical myelopathy ([place]) 08/15/2018  . Foot laceration, right, initial encounter   . Radiculopathy 03/08/2018  . Tubular adenoma 05/16/2017  . Depressed mood 05/16/2017  . Substance abuse ([place]) 05/16/2017  .  Hallux valgus 08/03/2016  . Disc disorder   . Lumbar facet arthropathy 08/06/2014  . Lumbar radiculopathy 08/06/2014  . ED (erectile dysfunction) 01/07/2014  . Unspecified vitamin D deficiency 01/07/2014  . Left shoulder pain 08/10/2013  . Chronic low back pain 08/10/2013  . Essential hypertension 08/10/2013  . Tobacco abuse 08/10/2013   Past Medical History:  Diagnosis Date  . Anxiety   . Arthritis    back, shoulders   . Constipation   . Depression   . Disc disorder   . Hypertension   . Lumbar herniated disc   . Muscle spasm     Family History  Problem Relation Age of  Onset  . Diabetes Mother   . Hypertension Mother   . Heart disease Mother   . Hyperlipidemia Mother   . Diabetes Brother   . Diabetes Daughter   . Hypertension Sister   . Diabetes Sister   . Stomach cancer Maternal Grandfather   . Colon cancer Neg Hx   . Esophageal cancer Neg Hx   . Rectal cancer Neg Hx   . Colon polyps Neg Hx     Past Surgical History:  Procedure Laterality Date  . ANTERIOR CERVICAL DECOMP/DISCECTOMY FUSION N/A 03/08/2018   Procedure: ANTERIOR CERVICAL DECOMPRESSION/DISCECTOMY FUSION , cervical 3-4, cervical 4-5, cervical 5-6 with intrumentational and allograft;  Surgeon: Phylliss Bob, MD;  Location: [facility];  Service: Orthopedics;  Laterality: N/A;  . BONE BIOPSY Right 05/17/2018   Procedure: BONE BIOPSY X2;  Surgeon: Evelina Bucy, DPM;  Location: [facility];  Service: Podiatry;  Laterality: Right;  right second toe  . COLONOSCOPY    . FOOT SURGERY     right, ~ 2002  . POLYPECTOMY     Social History   Occupational History  . Not on file  Tobacco Use  . Smoking status: Current Every Day Smoker    Packs/day: 0.10    Years: 20.00    Pack years: 2.00    Types: Cigarettes  . Smokeless tobacco: Never Used  . Tobacco comment: 5 cigs a day   Substance and Sexual Activity  . Alcohol use: No  . Drug use: No  . Sexual activity: Not Currently

## 2019-10-15 NOTE — Addendum Note (Signed)
Addended byLaurann Montana on: 10/15/2019 01:19 PM   Modules accepted: Orders

## 2019-10-20 ENCOUNTER — Other Ambulatory Visit: Payer: Self-pay

## 2019-10-20 ENCOUNTER — Ambulatory Visit (HOSPITAL_BASED_OUTPATIENT_CLINIC_OR_DEPARTMENT_OTHER)
Admission: RE | Admit: 2019-10-20 | Discharge: 2019-10-20 | Disposition: A | Discharge status: Home / Self Care | Payer: Medicare Other | Source: Ambulatory Visit | Attending: Orthopedic Surgery | Admitting: Orthopedic Surgery

## 2019-10-20 ENCOUNTER — Encounter (HOSPITAL_BASED_OUTPATIENT_CLINIC_OR_DEPARTMENT_OTHER): Payer: Self-pay

## 2019-10-20 DIAGNOSIS — M79605 Pain in left leg: Secondary | ICD-10-CM | POA: Diagnosis present

## 2019-10-20 DIAGNOSIS — S72112A Displaced fracture of greater trochanter of left femur, initial encounter for closed fracture: Secondary | ICD-10-CM | POA: Diagnosis present

## 2019-11-07 ENCOUNTER — Other Ambulatory Visit: Payer: Self-pay

## 2019-11-07 ENCOUNTER — Ambulatory Visit (INDEPENDENT_AMBULATORY_CARE_PROVIDER_SITE_OTHER): Payer: Medicare Other | Admitting: Orthopedic Surgery

## 2019-11-07 DIAGNOSIS — S72112A Displaced fracture of greater trochanter of left femur, initial encounter for closed fracture: Secondary | ICD-10-CM | POA: Diagnosis not present

## 2019-11-08 ENCOUNTER — Other Ambulatory Visit: Payer: Self-pay | Admitting: Internal Medicine

## 2019-11-08 DIAGNOSIS — M5416 Radiculopathy, lumbar region: Secondary | ICD-10-CM

## 2019-11-08 DIAGNOSIS — R252 Cramp and spasm: Secondary | ICD-10-CM

## 2019-11-08 DIAGNOSIS — R4589 Other symptoms and signs involving emotional state: Secondary | ICD-10-CM

## 2019-11-08 MED FILL — CYCLOBENZAPRINE 5 MG TABLET: 5 | 15 days supply | Qty: 30 | Fill #0

## 2019-11-08 MED FILL — HYDROCHLOROTHIAZIDE 25 MG T: 25 | 90 days supply | Qty: 90 | Fill #3

## 2019-11-08 MED FILL — AMLODIPINE BESYLATE 10 MG T: 10 | 90 days supply | Qty: 90 | Fill #3

## 2019-11-08 MED FILL — SERTRALINE HCL 50 MG TABLET: 50 | 30 days supply | Qty: 30 | Fill #0

## 2019-11-09 ENCOUNTER — Encounter: Payer: Self-pay | Admitting: Orthopedic Surgery

## 2019-11-09 NOTE — Progress Notes (Signed)
Office Visit Note   Patient: Calvin [name]           Date of Birth: [birth year]           MRN: [id number] Visit Date: 11/07/2019 Requested by: Ladell Pier, MD [place],  [place] [id number] PCP: Ladell Pier, MD  Subjective: Chief Complaint  Patient presents with  . Follow-up    HPI: Calvin [name] is a patient with left hip and low back pain.  Smokes 5 cigarettes a day.  Uses a cane for ambulation.  Does have a history of neck surgery.  Since have seen him he had an MRI scan.  Does showed general stability compared with scan from 2019.  He does have some compression which could affect the L5 nerve root on either side.  He does have a history of "bad result" with lumbar spine ESI.  He also had MRI scan of the left hip.  Does show incompletely healed transverse fracture of the greater trochanter.  His injury is about 13 months old.              ROS: All systems reviewed are negative as they relate to the chief complaint within the history of present illness.  Patient denies  fevers or chills.   Assessment & Plan: Visit Diagnoses:  1. Closed avulsion fracture of greater trochanter of left femur, initial encounter ([place])     Plan: Impression is left hip and low back pain.  He is focally tender around the trochanteric region.  I think surgery could be considered if all conservative measures fail.  I would like to try bone stimulator first and then see him back in 2 months for clinical recheck.  It does not really look like the back is a big pain generator in this particular instance although he does have some degenerative changes present.  Bone scan ordered and return follow-up in 8 weeks for clinical recheck and consideration of further intervention if symptoms have not improved.  Follow-Up Instructions: No follow-ups on file.   Orders:  No orders of the defined types were placed in this encounter.  No orders of the defined types were placed in this encounter.     Procedures: No procedures performed   Clinical Data: No additional findings.  Objective: Vital Signs: There were no vitals taken for this visit.  Physical Exam:   Constitutional: Patient appears well-developed HEENT:  Head: Normocephalic Eyes:EOM are normal Neck: Normal range of motion Cardiovascular: Normal rate Pulmonary/chest: Effort normal Neurologic: Patient is alert Skin: Skin is warm Psychiatric: Patient has normal mood and affect    Ortho Exam: Ortho exam demonstrates full active and passive range of motion of the knees and ankles.  No nerve root tension signs.  Not too much pain with internal or external rotation of either leg.  Does have focal tenderness around the trochanteric region.  No hip flexor weakness on the left and right-hand side.  No definite paresthesias L1 S1 bilaterally.  Negative clonus.  Pedal pulses palpable.  Specialty Comments:  No specialty comments available.  Imaging: No results found.   PMFS History: Patient Active Problem List   Diagnosis Date Noted  . Severe sepsis ([place]) 08/07/2019  . Lobar pneumonia ([place]) 08/07/2019  . Diarrhea 08/07/2019  . Anemia 08/07/2019  . Cervical myelopathy ([place]) 08/15/2018  . Foot laceration, right, initial encounter   . Radiculopathy 03/08/2018  . Tubular adenoma 05/16/2017  . Depressed mood 05/16/2017  . Substance abuse ([place]) 05/16/2017  .  Hallux valgus 08/03/2016  . Disc disorder   . Lumbar facet arthropathy 08/06/2014  . Lumbar radiculopathy 08/06/2014  . ED (erectile dysfunction) 01/07/2014  . Unspecified vitamin D deficiency 01/07/2014  . Left shoulder pain 08/10/2013  . Chronic low back pain 08/10/2013  . Essential hypertension 08/10/2013  . Tobacco abuse 08/10/2013   Past Medical History:  Diagnosis Date  . Anxiety   . Arthritis    back, shoulders   . Constipation   . Depression   . Disc disorder   . Hypertension   . Lumbar herniated disc   . Muscle spasm     Family History    Problem Relation Age of Onset  . Diabetes Mother   . Hypertension Mother   . Heart disease Mother   . Hyperlipidemia Mother   . Diabetes Brother   . Diabetes Daughter   . Hypertension Sister   . Diabetes Sister   . Stomach cancer Maternal Grandfather   . Colon cancer Neg Hx   . Esophageal cancer Neg Hx   . Rectal cancer Neg Hx   . Colon polyps Neg Hx     Past Surgical History:  Procedure Laterality Date  . ANTERIOR CERVICAL DECOMP/DISCECTOMY FUSION N/A 03/08/2018   Procedure: ANTERIOR CERVICAL DECOMPRESSION/DISCECTOMY FUSION , cervical 3-4, cervical 4-5, cervical 5-6 with intrumentational and allograft;  Surgeon: Phylliss Bob, MD;  Location: [facility];  Service: Orthopedics;  Laterality: N/A;  . BONE BIOPSY Right 05/17/2018   Procedure: BONE BIOPSY X2;  Surgeon: Evelina Bucy, DPM;  Location: [facility];  Service: Podiatry;  Laterality: Right;  right second toe  . COLONOSCOPY    . FOOT SURGERY     right, ~ 2002  . POLYPECTOMY     Social History   Occupational History  . Not on file  Tobacco Use  . Smoking status: Current Every Day Smoker    Packs/day: 0.10    Years: 20.00    Pack years: 2.00    Types: Cigarettes  . Smokeless tobacco: Never Used  . Tobacco comment: 5 cigs a day   Substance and Sexual Activity  . Alcohol use: No  . Drug use: No  . Sexual activity: Not Currently

## 2019-12-06 ENCOUNTER — Ambulatory Visit: Payer: Medicare Other | Admitting: Internal Medicine

## 2019-12-28 ENCOUNTER — Ambulatory Visit: Payer: Medicare Other | Admitting: Internal Medicine

## 2020-01-03 ENCOUNTER — Other Ambulatory Visit: Payer: Self-pay | Admitting: Internal Medicine

## 2020-01-03 DIAGNOSIS — M5416 Radiculopathy, lumbar region: Secondary | ICD-10-CM

## 2020-01-03 DIAGNOSIS — R252 Cramp and spasm: Secondary | ICD-10-CM

## 2020-02-22 ENCOUNTER — Ambulatory Visit (INDEPENDENT_AMBULATORY_CARE_PROVIDER_SITE_OTHER): Payer: Medicare Other | Admitting: Orthopedic Surgery

## 2020-02-22 ENCOUNTER — Encounter: Payer: Self-pay | Admitting: Orthopedic Surgery

## 2020-02-22 DIAGNOSIS — S72112A Displaced fracture of greater trochanter of left femur, initial encounter for closed fracture: Secondary | ICD-10-CM | POA: Diagnosis not present

## 2020-02-22 NOTE — Progress Notes (Signed)
Office Visit Note   Patient: Calvin [name]           Date of Birth: [birth year]           MRN: [id number] Visit Date: 02/22/2020 Requested by: Ladell Pier, MD [place],  [place] [id number] PCP: Ladell Pier, MD  Subjective: Chief Complaint  Patient presents with  . Follow-up    HPI: Kveon is a patient with closed partial avulsion fracture of the greater trochanter on the left hip.  He does have a bone stimulator.  In general he is doing little bit better.  States he can walk a mile.  Overall his pain is improved.  Smoking a little less as well.              ROS: All systems reviewed are negative as they relate to the chief complaint within the history of present illness.  Patient denies  fevers or chills.   Assessment & Plan: Visit Diagnoses:  1. Closed avulsion fracture of greater trochanter of left femur, initial encounter ([place])     Plan: Impression is left hip evulsion fracture closed.  Plan is left hip observation.  I think he needs return office visit in 3 months for final x-rays.  Hip abduction flexion and adduction strength is good today.  Minimal tenderness to direct palpation.  Follow-up in 3 months with repeat x-rays and likely release at that time  Follow-Up Instructions: Return in about 3 months (around 05/24/2020).   Orders:  No orders of the defined types were placed in this encounter.  No orders of the defined types were placed in this encounter.     Procedures: No procedures performed   Clinical Data: No additional findings.  Objective: Vital Signs: There were no vitals taken for this visit.  Physical Exam:   Constitutional: Patient appears well-developed HEENT:  Head: Normocephalic Eyes:EOM are normal Neck: Normal range of motion Cardiovascular: Normal rate Pulmonary/chest: Effort normal Neurologic: Patient is alert Skin: Skin is warm Psychiatric: Patient has normal mood and affect    Ortho Exam: Ortho exam  demonstrates ambulation with a cane which she is relying on less and less now.  No tenderness to palpation of the left or right greater trochanteric region.  Hip flexion abduction abduction strength 5+ out of 5 bilaterally.  No groin pain with internal extra rotation of the leg.  Specialty Comments:  No specialty comments available.  Imaging: No results found.   PMFS History: Patient Active Problem List   Diagnosis Date Noted  . Severe sepsis ([place]) 08/07/2019  . Lobar pneumonia ([place]) 08/07/2019  . Diarrhea 08/07/2019  . Anemia 08/07/2019  . Cervical myelopathy ([place]) 08/15/2018  . Foot laceration, right, initial encounter   . Radiculopathy 03/08/2018  . Tubular adenoma 05/16/2017  . Depressed mood 05/16/2017  . Substance abuse ([place]) 05/16/2017  . Hallux valgus 08/03/2016  . Disc disorder   . Lumbar facet arthropathy 08/06/2014  . Lumbar radiculopathy 08/06/2014  . ED (erectile dysfunction) 01/07/2014  . Unspecified vitamin D deficiency 01/07/2014  . Left shoulder pain 08/10/2013  . Chronic low back pain 08/10/2013  . Essential hypertension 08/10/2013  . Tobacco abuse 08/10/2013   Past Medical History:  Diagnosis Date  . Anxiety   . Arthritis    back, shoulders   . Constipation   . Depression   . Disc disorder   . Hypertension   . Lumbar herniated disc   . Muscle spasm     Family  History  Problem Relation Age of Onset  . Diabetes Mother   . Hypertension Mother   . Heart disease Mother   . Hyperlipidemia Mother   . Diabetes Brother   . Diabetes Daughter   . Hypertension Sister   . Diabetes Sister   . Stomach cancer Maternal Grandfather   . Colon cancer Neg Hx   . Esophageal cancer Neg Hx   . Rectal cancer Neg Hx   . Colon polyps Neg Hx     Past Surgical History:  Procedure Laterality Date  . ANTERIOR CERVICAL DECOMP/DISCECTOMY FUSION N/A 03/08/2018   Procedure: ANTERIOR CERVICAL DECOMPRESSION/DISCECTOMY FUSION , cervical 3-4, cervical 4-5, cervical 5-6 with  intrumentational and allograft;  Surgeon: Phylliss Bob, MD;  Location: [facility];  Service: Orthopedics;  Laterality: N/A;  . BONE BIOPSY Right 05/17/2018   Procedure: BONE BIOPSY X2;  Surgeon: Evelina Bucy, DPM;  Location: [facility];  Service: Podiatry;  Laterality: Right;  right second toe  . COLONOSCOPY    . FOOT SURGERY     right, ~ 2002  . POLYPECTOMY     Social History   Occupational History  . Not on file  Tobacco Use  . Smoking status: Current Every Day Smoker    Packs/day: 0.10    Years: 20.00    Pack years: 2.00    Types: Cigarettes  . Smokeless tobacco: Never Used  . Tobacco comment: 5 cigs a day   Vaping Use  . Vaping Use: Never used  Substance and Sexual Activity  . Alcohol use: No  . Drug use: No  . Sexual activity: Not Currently

## 2020-03-27 ENCOUNTER — Encounter: Payer: Self-pay | Admitting: Internal Medicine

## 2020-03-27 ENCOUNTER — Other Ambulatory Visit: Payer: Self-pay

## 2020-03-27 ENCOUNTER — Ambulatory Visit: Payer: Medicare Other | Attending: Internal Medicine | Admitting: Internal Medicine

## 2020-03-27 ENCOUNTER — Other Ambulatory Visit: Payer: Self-pay | Admitting: Internal Medicine

## 2020-03-27 VITALS — BP 140/84 | HR 70 | Temp 98.4°F | Resp 16 | Wt 150.0 lb

## 2020-03-27 DIAGNOSIS — F330 Major depressive disorder, recurrent, mild: Secondary | ICD-10-CM | POA: Diagnosis not present

## 2020-03-27 DIAGNOSIS — Z23 Encounter for immunization: Secondary | ICD-10-CM

## 2020-03-27 DIAGNOSIS — I10 Essential (primary) hypertension: Secondary | ICD-10-CM

## 2020-03-27 DIAGNOSIS — M5416 Radiculopathy, lumbar region: Secondary | ICD-10-CM

## 2020-03-27 DIAGNOSIS — R252 Cramp and spasm: Secondary | ICD-10-CM

## 2020-03-27 DIAGNOSIS — F17200 Nicotine dependence, unspecified, uncomplicated: Secondary | ICD-10-CM

## 2020-03-27 MED ORDER — SERTRALINE HCL 50 MG PO TABS: ORAL_TABLET | ORAL | 3 refills | Status: DC

## 2020-03-27 MED ORDER — CYCLOBENZAPRINE HCL 5 MG PO TABS: 5.0000 mg | ORAL_TABLET | Freq: Two times a day (BID) | ORAL | 2 refills | Status: DC | PRN

## 2020-03-27 MED ORDER — AMLODIPINE BESYLATE 5 MG PO TABS: 5.0000 mg | ORAL_TABLET | Freq: Every day | ORAL | 4 refills | Status: DC

## 2020-03-27 MED FILL — SERTRALINE HCL 50 MG TABLET: 50 | 30 days supply | Qty: 30 | Fill #0

## 2020-03-27 MED FILL — AMLODIPINE BESYLATE 5 MG TA: 5 | 90 days supply | Qty: 90 | Fill #0

## 2020-03-27 MED FILL — CYCLOBENZAPRINE 5 MG TABLET: 5 | 15 days supply | Qty: 30 | Fill #0

## 2020-03-27 NOTE — Progress Notes (Signed)
Patient ID: Calvin [name], male    DOB: [birth year]  MRN: [id number]  CC: Hypertension   Subjective: Calvin [name] is a 59 y.o. male who presents for chronic ds management. His concerns today include:  Pt with hx of HTN, chronic LBP, ED, tobacco, cocaine use, depression  Had chip frx LT hip last year when he slipped on his kitchen floor.   Slow to heal but doing better.  Still walking with cane for security.  LT leg is stiff.  Plans to join the gym. Wants RF on Flexeril.  Helps dec stiffness in the leg Neck does not bother him since he had decompression surgery He was hospitalized in January of this year with pneumonia and lung abscess.  TB sputum cultures were negative.  He had lost significant weight but has regained his weight.  He was also anemic at that time.  Out of Zoloft for mths.  He is requesting refill.  He is taking it for depression.  He states that the medicine makes him feel less stress and helps to relax him.    HTN: off both BP meds x 2 mths Checks BP daily but does not have log with him.  Reports readings have been good Limits salt No CP/SOB.  No LE edema  Tob dep:  "I hardly ever smoke now."  Smokes about 3-4 cigaretts  a wk.  He has the patches.  Plans to quit on his BD next mth Patient Active Problem List   Diagnosis Date Noted  . Severe sepsis ([place]) 08/07/2019  . Lobar pneumonia ([place]) 08/07/2019  . Diarrhea 08/07/2019  . Anemia 08/07/2019  . Cervical myelopathy ([place]) 08/15/2018  . Foot laceration, right, initial encounter   . Radiculopathy 03/08/2018  . Tubular adenoma 05/16/2017  . Depressed mood 05/16/2017  . Substance abuse ([place]) 05/16/2017  . Hallux valgus 08/03/2016  . Disc disorder   . Lumbar facet arthropathy 08/06/2014  . Lumbar radiculopathy 08/06/2014  . ED (erectile dysfunction) 01/07/2014  . Unspecified vitamin D deficiency 01/07/2014  . Left shoulder pain 08/10/2013  . Chronic low back pain 08/10/2013  . Essential hypertension 08/10/2013  .  Tobacco abuse 08/10/2013     Current Outpatient Medications on File Prior to Visit  Medication Sig Dispense Refill  . nicotine (NICODERM CQ - DOSED IN MG/24 HR) 7 mg/24hr patch Place 1 patch (7 mg total) onto the skin daily. (Patient not taking: Reported on 09/05/2019) 28 patch 0   No current facility-administered medications on file prior to visit.    No Known Allergies  Social History   Socioeconomic History  . Marital status: Single    Spouse name: Not on file  . Number of children: Not on file  . Years of education: Not on file  . Highest education level: Not on file  Occupational History  . Not on file  Tobacco Use  . Smoking status: Current Every Day Smoker    Packs/day: 0.10    Years: 20.00    Pack years: 2.00    Types: Cigarettes  . Smokeless tobacco: Never Used  . Tobacco comment: 5 cigs a day   Vaping Use  . Vaping Use: Never used  Substance and Sexual Activity  . Alcohol use: No  . Drug use: No  . Sexual activity: Not Currently  Other Topics Concern  . Not on file  Social History Narrative  . Not on file   Social Determinants of Health   Financial Resource Strain:   . Difficulty  of Paying Living Expenses: Not on file  Food Insecurity:   . Worried About Charity fundraiser in the Last Year: Not on file  . Ran Out of Food in the Last Year: Not on file  Transportation Needs:   . Lack of Transportation (Medical): Not on file  . Lack of Transportation (Non-Medical): Not on file  Physical Activity:   . Days of Exercise per Week: Not on file  . Minutes of Exercise per Session: Not on file  Stress:   . Feeling of Stress : Not on file  Social Connections:   . Frequency of Communication with Friends and Family: Not on file  . Frequency of Social Gatherings with Friends and Family: Not on file  . Attends Religious Services: Not on file  . Active Member of Clubs or Organizations: Not on file  . Attends Archivist Meetings: Not on file  . Marital  Status: Not on file  Intimate Partner Violence:   . Fear of Current or Ex-Partner: Not on file  . Emotionally Abused: Not on file  . Physically Abused: Not on file  . Sexually Abused: Not on file    Family History  Problem Relation Age of Onset  . Diabetes Mother   . Hypertension Mother   . Heart disease Mother   . Hyperlipidemia Mother   . Diabetes Brother   . Diabetes Daughter   . Hypertension Sister   . Diabetes Sister   . Stomach cancer Maternal Grandfather   . Colon cancer Neg Hx   . Esophageal cancer Neg Hx   . Rectal cancer Neg Hx   . Colon polyps Neg Hx     Past Surgical History:  Procedure Laterality Date  . ANTERIOR CERVICAL DECOMP/DISCECTOMY FUSION N/A 03/08/2018   Procedure: ANTERIOR CERVICAL DECOMPRESSION/DISCECTOMY FUSION , cervical 3-4, cervical 4-5, cervical 5-6 with intrumentational and allograft;  Surgeon: Phylliss Bob, MD;  Location: [facility];  Service: Orthopedics;  Laterality: N/A;  . BONE BIOPSY Right 05/17/2018   Procedure: BONE BIOPSY X2;  Surgeon: Evelina Bucy, DPM;  Location: [facility];  Service: Podiatry;  Laterality: Right;  right second toe  . COLONOSCOPY    . FOOT SURGERY     right, ~ 2002  . POLYPECTOMY      ROS: Review of Systems Negative except as stated above  PHYSICAL EXAM: BP 140/84   Pulse 70   Temp 98.4 F (36.9 C)   Resp 16   Wt 150 lb (68 kg)   SpO2 98%   BMI 19.79 kg/m   Wt Readings from Last 3 Encounters:  03/27/20 150 lb (68 kg)  09/05/19 127 lb (57.6 kg)  08/08/19 129 lb 13.6 oz (58.9 kg)    Physical Exam General appearance - alert, well appearing, and in no distress Mental status - normal mood, behavior, speech, dress, motor activity, and thought processes Neck - supple, no significant adenopathy Chest - clear to auscultation, no wheezes, rales or rhonchi, symmetric air entry Heart - normal rate, regular rhythm, normal S1, S2, no murmurs, rubs, clicks or gallops Extremities -no lower  extremity edema. MSK: Patient ambulating with a cane.  He has a scissoring gait.  Left leg is stiff. Depression screen [facility] 2/9 03/27/2020 09/05/2019 08/15/2018  Decreased Interest 1 0 2  Down, Depressed, Hopeless 1 0 2  PHQ - 2 Score 2 0 4  Altered sleeping 3 0 1  Tired, decreased energy 3 0 2  Change in appetite 2 0  1  Feeling bad or failure about yourself  1 0 1  Trouble concentrating 2 0 2  Moving slowly or fidgety/restless 0 0 1  Suicidal thoughts 0 0 1  PHQ-9 Score 13 0 13  Difficult doing work/chores - - -  Some recent data might be hidden    CMP Latest Ref Rng & Units 08/12/2019 08/11/2019 08/10/2019  Glucose 70 - 99 mg/dL 114(H) 96 107(H)  BUN 6 - 20 mg/dL 16 11 8   Creatinine 0.61 - 1.24 mg/dL 0.80 0.80 0.95  Sodium 135 - 145 mmol/L 135 136 135  Potassium 3.5 - 5.1 mmol/L 4.1 3.7 4.0  Chloride 98 - 111 mmol/L 103 105 100  CO2 22 - 32 mmol/L 23 24 26   Calcium 8.9 - 10.3 mg/dL 7.8(L) 7.6(L) 7.7(L)  Total Protein 6.5 - 8.1 g/dL - 5.6(L) -  Total Bilirubin 0.3 - 1.2 mg/dL - 0.6 -  Alkaline Phos 38 - 126 U/L - 115 -  AST 15 - 41 U/L - 22 -  ALT 0 - 44 U/L - 16 -   Lipid Panel     Component Value Date/Time   CHOL 158 12/24/2016 1557   TRIG 79 12/24/2016 1557   HDL 41 12/24/2016 1557   CHOLHDL 3.9 12/24/2016 1557   CHOLHDL 4.7 12/01/2015 1437   VLDL 18 12/01/2015 1437   LDLCALC 101 (H) 12/24/2016 1557    CBC    Component Value Date/Time   WBC 8.9 09/05/2019 1423   WBC 16.8 (H) 08/12/2019 0524   RBC 4.04 (L) 09/05/2019 1423   RBC 3.28 (L) 08/12/2019 0524   HGB 11.2 (L) 09/05/2019 1423   HCT 35.0 (L) 09/05/2019 1423   PLT 310 09/05/2019 1423   MCV 87 09/05/2019 1423   MCH 27.7 09/05/2019 1423   MCH 26.2 08/12/2019 0524   MCHC 32.0 09/05/2019 1423   MCHC 31.4 08/12/2019 0524   RDW 20.7 (H) 09/05/2019 1423   LYMPHSABS 2.2 09/05/2019 1423   MONOABS 1.4 (H) 08/12/2019 0524   EOSABS 0.2 09/05/2019 1423   BASOSABS 0.1 09/05/2019 1423    ASSESSMENT AND PLAN:  1.  Essential hypertension Not at goal.  We will restart him on Norvasc at a low dose.  Advised to check his blood pressure at home.  Goal is 130/80 or lower. - amLODipine (NORVASC) 5 MG tablet; Take 1 tablet (5 mg total) by mouth daily.  Dispense: 30 tablet; Refill: 4 - CBC - Comprehensive metabolic panel - Lipid panel  2. Mild episode of recurrent major depressive disorder ([place]) We will have him restart Zoloft.  He reports that he does well on the medication. - sertraline (ZOLOFT) 50 MG tablet; 1/2 tab daily x 2 weeks then 1 tab daily.  Dispense: 30 tablet; Refill: 3  3. Lumbar radiculopathy - cyclobenzaprine (FLEXERIL) 5 MG tablet; Take 1 tablet (5 mg total) by mouth 2 (two) times daily as needed for muscle spasms.  Dispense: 30 tablet; Refill: 2  4. Cramp of both lower extremities - cyclobenzaprine (FLEXERIL) 5 MG tablet; Take 1 tablet (5 mg total) by mouth 2 (two) times daily as needed for muscle spasms.  Dispense: 30 tablet; Refill: 2  5. Need for immunization against influenza Given today - Flu Vaccine QUAD 36+ mos IM  6. Tobacco dependence Advised to quit.  He is aware of health risks associated with smoking.  He has set a quit date for next month.  Less than 5 minutes spent on counseling.    Patient was  given the opportunity to ask questions.  Patient verbalized understanding of the plan and was able to repeat key elements of the plan.   Orders Placed This Encounter  Procedures  . Flu Vaccine QUAD 36+ mos IM  . CBC  . Comprehensive metabolic panel  . Lipid panel     Requested Prescriptions   Signed Prescriptions Disp Refills  . sertraline (ZOLOFT) 50 MG tablet 30 tablet 3    Sig: 1/2 tab daily x 2 weeks then 1 tab daily.  . cyclobenzaprine (FLEXERIL) 5 MG tablet 30 tablet 2    Sig: Take 1 tablet (5 mg total) by mouth 2 (two) times daily as needed for muscle spasms.  Marland Kitchen amLODipine (NORVASC) 5 MG tablet 30 tablet 4    Sig: Take 1 tablet (5 mg total) by mouth daily.     Return in about 3 months (around 06/26/2020).  Karle Plumber, MD, FACP

## 2020-03-27 NOTE — Patient Instructions (Signed)
Restart Amlodipine 5 mg daily.  Check blood pressure once a week.  Gold is 130/80.

## 2020-03-28 LAB — LIPID PANEL
Chol/HDL Ratio: 2.8 ratio (ref 0.0–5.0)
Cholesterol, Total: 176 mg/dL (ref 100–199)
HDL: 62 mg/dL (ref >39)
LDL Chol Calc (NIH): 105 mg/dL — ABNORMAL HIGH (ref 0–99)
Triglycerides: 44 mg/dL (ref 0–149)
VLDL Cholesterol Cal: 9 mg/dL (ref 5–40)

## 2020-03-28 LAB — COMPREHENSIVE METABOLIC PANEL
ALT: 20 IU/L (ref 0–44)
AST: 28 IU/L (ref 0–40)
Albumin/Globulin Ratio: 1.4 (ref 1.2–2.2)
Albumin: 4.5 g/dL (ref 3.8–4.9)
Alkaline Phosphatase: 108 IU/L (ref 48–121)
BUN/Creatinine Ratio: 16 (ref 9–20)
BUN: 22 mg/dL (ref 6–24)
Bilirubin Total: <0.2 mg/dL (ref 0.0–1.2)
CO2: 27 mmol/L (ref 20–29)
Calcium: 9.2 mg/dL (ref 8.7–10.2)
Chloride: 101 mmol/L (ref 96–106)
Creatinine, Ser: 1.36 mg/dL — ABNORMAL HIGH (ref 0.76–1.27)
GFR calc Af Amer: 66 mL/min/{1.73_m2} (ref >59)
GFR calc non Af Amer: 57 mL/min/{1.73_m2} — ABNORMAL LOW (ref >59)
Globulin, Total: 3.2 g/dL (ref 1.5–4.5)
Glucose: 76 mg/dL (ref 65–99)
Potassium: 4.6 mmol/L (ref 3.5–5.2)
Sodium: 138 mmol/L (ref 134–144)
Total Protein: 7.7 g/dL (ref 6.0–8.5)

## 2020-03-28 LAB — CBC
Hematocrit: 37.9 % (ref 37.5–51.0)
Hemoglobin: 12.8 g/dL — ABNORMAL LOW (ref 13.0–17.7)
MCH: 28.3 pg (ref 26.6–33.0)
MCHC: 33.8 g/dL (ref 31.5–35.7)
MCV: 84 fL (ref 79–97)
Platelets: 177 10*3/uL (ref 150–450)
RBC: 4.52 x10E6/uL (ref 4.14–5.80)
RDW: 13.6 % (ref 11.6–15.4)
WBC: 6.9 10*3/uL (ref 3.4–10.8)

## 2020-04-01 ENCOUNTER — Telehealth: Payer: Self-pay

## 2020-04-01 NOTE — Telephone Encounter (Signed)
Contacted pt to go over lab results pt didn't answer lvm asking pt to give a call back  °

## 2020-04-25 MED FILL — CYCLOBENZAPRINE 5 MG TABLET: 5 | 15 days supply | Qty: 30 | Fill #1

## 2020-04-25 MED FILL — SERTRALINE HCL 50 MG TABLET: 50 | 30 days supply | Qty: 30 | Fill #1

## 2020-05-07 ENCOUNTER — Ambulatory Visit: Payer: Self-pay

## 2020-05-07 ENCOUNTER — Ambulatory Visit (INDEPENDENT_AMBULATORY_CARE_PROVIDER_SITE_OTHER): Payer: Medicare Other | Admitting: Orthopedic Surgery

## 2020-05-07 ENCOUNTER — Encounter: Payer: Self-pay | Admitting: Orthopedic Surgery

## 2020-05-07 DIAGNOSIS — M542 Cervicalgia: Secondary | ICD-10-CM

## 2020-05-07 DIAGNOSIS — S72112A Displaced fracture of greater trochanter of left femur, initial encounter for closed fracture: Secondary | ICD-10-CM

## 2020-05-07 DIAGNOSIS — M549 Dorsalgia, unspecified: Secondary | ICD-10-CM

## 2020-05-07 NOTE — Progress Notes (Signed)
Office Visit Note   Patient: Calvin [name]           Date of Birth: March 05, 1961           MRN: [id number] Visit Date: 05/07/2020 Requested by: Ladell Pier, MD [place],  [place] [address] PCP: Ladell Pier, MD  Subjective: Chief Complaint  Patient presents with  . Left Hip - Follow-up    HPI: Calvin [name] is a 59 year old patient with left hip fracture.  States that the pain has gotten worse.  Hurts the most of the time.  He also reports some right sided numbness and tingling in the leg.  Using a cane to ambulate.  Takes BC powders with relief.  Had cervical spine surgery earlier this year.  Has had an MRI scan of the lumbar spine several months ago which was unremarkable.              ROS: All systems reviewed are negative as they relate to the chief complaint within the history of present illness.  Patient denies  fevers or chills.   Assessment & Plan: Visit Diagnoses:  1. Closed avulsion fracture of greater trochanter of left femur, initial encounter ([place])   2. Neck pain   3. Mid back pain     Plan: Impression is atypical gait pattern with healed left greater trochanteric fracture.  No arthritis in the hip.  Upper extremities have no real restriction but his lower extremities have atypical tightness with no hyper reflexia.  Thoracic spine x-rays unremarkable.  Cervical spine I think could be the culprit even though he has no overt myelopathic symptoms.  If the cervical spine MRI is negative then neurological referral is indicated to evaluate the atypical ambulatory pattern in the lower extremities.  Follow-Up Instructions: Return for after MRI.   Orders:  Orders Placed This Encounter  Procedures  . XR HIP UNILAT W OR W/O PELVIS 2-3 VIEWS LEFT  . XR Cervical Spine 2 or 3 views  . XR Thoracic Spine 2 View  . MR Cervical Spine w/o contrast   No orders of the defined types were placed in this encounter.     Procedures: No procedures performed   Clinical  Data: No additional findings.  Objective: Vital Signs: There were no vitals taken for this visit.  Physical Exam:   Constitutional: Patient appears well-developed HEENT:  Head: Normocephalic Eyes:EOM are normal Neck: Normal range of motion Cardiovascular: Normal rate Pulmonary/chest: Effort normal Neurologic: Patient is alert Skin: Skin is warm Psychiatric: Patient has normal mood and affect    Ortho Exam: Ortho exam demonstrates 5 out of 5 grip EPL FPL interosseous restriction extension bicep triceps and deltoid strength in the upper extremities with 2+ out of 4 reflexes biceps and triceps bilaterally.  Radial pulse intact bilaterally.  No real cogwheel rigidity or increased tension in the upper extremities.  No spasticity.  In the lower extremities the patient has significant muscle tightening with some paresthesias in the right leg.  Negative clonus negative Babinski.  Ankle dorsiflexion plantarflexion strength is intact.  No groin pain on either side with internal/external rotation of the leg.  No real muscle atrophy in the leg.  Reflexes 0 to 1+ out of 4 bilateral patella and Achilles.  Gait pattern is very atypical.  He uses a cane and has significant increased tension in the adductor's.  Shuffling gait with very small stride length.  Specialty Comments:  No specialty comments available.  Imaging: XR HIP UNILAT W  OR W/O PELVIS 2-3 VIEWS LEFT  Result Date: 05/07/2020 AP pelvis lateral left hip reviewed.  Slight deformity of the greater trochanteric region is noted with no fracture lucencies present.  Hip joint itself has no arthritis on the left the right-hand side.  No acute fractures.  XR Thoracic Spine 2 View  Result Date: 05/07/2020 AP lateral thoracic spine reviewed.  Slight kyphosis present.  No acute compression fractures.  Mild degenerative changes noted.  XR Cervical Spine 2 or 3 views  Result Date: 05/07/2020 AP lateral cervical spine reviewed.  Mid cervical  fusion has occurred with plate and screws and cages in good position.  There is adjacent segment disease primarily below the construct.  No lucencies around the screws.  Facet arthritis is moderate to severe in the middle and lower cervical spine regions.    PMFS History: Patient Active Problem List   Diagnosis Date Noted  . Severe sepsis ([place]) 08/07/2019  . Lobar pneumonia ([place]) 08/07/2019  . Diarrhea 08/07/2019  . Anemia 08/07/2019  . Cervical myelopathy ([place]) 08/15/2018  . Foot laceration, right, initial encounter   . Radiculopathy 03/08/2018  . Tubular adenoma 05/16/2017  . Depressed mood 05/16/2017  . Substance abuse ([place]) 05/16/2017  . Hallux valgus 08/03/2016  . Disc disorder   . Lumbar facet arthropathy 08/06/2014  . Lumbar radiculopathy 08/06/2014  . ED (erectile dysfunction) 01/07/2014  . Unspecified vitamin D deficiency 01/07/2014  . Left shoulder pain 08/10/2013  . Chronic low back pain 08/10/2013  . Essential hypertension 08/10/2013  . Tobacco abuse 08/10/2013   Past Medical History:  Diagnosis Date  . Anxiety   . Arthritis    back, shoulders   . Constipation   . Depression   . Disc disorder   . Hypertension   . Lumbar herniated disc   . Muscle spasm     Family History  Problem Relation Age of Onset  . Diabetes Mother   . Hypertension Mother   . Heart disease Mother   . Hyperlipidemia Mother   . Diabetes Brother   . Diabetes Daughter   . Hypertension Sister   . Diabetes Sister   . Stomach cancer Maternal Grandfather   . Colon cancer Neg Hx   . Esophageal cancer Neg Hx   . Rectal cancer Neg Hx   . Colon polyps Neg Hx     Past Surgical History:  Procedure Laterality Date  . ANTERIOR CERVICAL DECOMP/DISCECTOMY FUSION N/A 03/08/2018   Procedure: ANTERIOR CERVICAL DECOMPRESSION/DISCECTOMY FUSION , cervical 3-4, cervical 4-5, cervical 5-6 with intrumentational and allograft;  Surgeon: Phylliss Bob, MD;  Location: [facility];  Service: Orthopedics;   Laterality: N/A;  . BONE BIOPSY Right 05/17/2018   Procedure: BONE BIOPSY X2;  Surgeon: Evelina Bucy, DPM;  Location: [facility];  Service: Podiatry;  Laterality: Right;  right second toe  . COLONOSCOPY    . FOOT SURGERY     right, ~ 2002  . POLYPECTOMY     Social History   Occupational History  . Not on file  Tobacco Use  . Smoking status: Current Every Day Smoker    Packs/day: 0.10    Years: 20.00    Pack years: 2.00    Types: Cigarettes  . Smokeless tobacco: Never Used  . Tobacco comment: 5 cigs a day   Vaping Use  . Vaping Use: Never used  Substance and Sexual Activity  . Alcohol use: No  . Drug use: No  . Sexual activity: Not Currently

## 2020-05-10 ENCOUNTER — Other Ambulatory Visit: Payer: Medicare Other

## 2020-05-15 ENCOUNTER — Other Ambulatory Visit: Payer: Self-pay

## 2020-05-15 ENCOUNTER — Ambulatory Visit
Admission: RE | Admit: 2020-05-15 | Discharge: 2020-05-15 | Disposition: A | Discharge status: Home / Self Care | Payer: Medicare Other | Source: Ambulatory Visit | Attending: Orthopedic Surgery | Admitting: Orthopedic Surgery

## 2020-05-15 DIAGNOSIS — M542 Cervicalgia: Secondary | ICD-10-CM

## 2020-05-19 ENCOUNTER — Encounter: Payer: Self-pay | Admitting: Orthopedic Surgery

## 2020-05-19 ENCOUNTER — Ambulatory Visit (INDEPENDENT_AMBULATORY_CARE_PROVIDER_SITE_OTHER): Payer: Medicare Other | Admitting: Orthopedic Surgery

## 2020-05-19 DIAGNOSIS — M542 Cervicalgia: Secondary | ICD-10-CM | POA: Diagnosis not present

## 2020-05-19 NOTE — Progress Notes (Signed)
Office Visit Note   Patient: Calvin [name]           Date of Birth: December 28, 1960           MRN: [id number] Visit Date: 05/19/2020 Requested by: Ladell Pier, MD [address] [place],  [place] [address] PCP: Ladell Pier, MD  Subjective: Chief Complaint  Patient presents with   scan review    HPI: Calvin [name] is a 59 year old patient with altered gait healed left trochanteric fracture and paresthesias on the left side of his body.  Since have last seen him he has had cervical spine MRI.  Had previous L-spine MRI early in his clinical course which was essentially negative.  Denies any thoracic symptoms.  Describes numbness going down the entire left side of his body.  Also has a very antalgic shortened type gait.  Had neck surgery which was multilevel fusion with Dr. Lynann Bologna within the past several years.              ROS: All systems reviewed are negative as they relate to the chief complaint within the history of present illness.  Patient denies  fevers or chills.   Assessment & Plan: Visit Diagnoses:  1. 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  Reviewed the scan with Calvin [name] and it does look like there is a fair amount of tightness around C2-3.  Stenosis present.  I think this could explain his altered gait pattern.  Also is the most likely explanation for his left-sided paresthesias.  If the neck is not thought to be the source of the problem by his operating surgeon then he should be referred to neurology for further work-up.  Follow-up with me as needed.  Follow-Up Instructions: Return if symptoms worsen or fail to improve.   Orders:  Orders Placed This Encounter  Procedures   Ambulatory referral to Orthopedic Surgery   No orders of the defined types were placed in this encounter.     Procedures: No procedures performed   Clinical Data: No additional findings.  Objective: Vital Signs: There were no  vitals taken for this visit.  Physical Exam:   Constitutional: Patient appears well-developed HEENT:  Head: Normocephalic Eyes:EOM are normal Neck: Normal range of motion Cardiovascular: Normal rate Pulmonary/chest: Effort normal Neurologic: Patient is alert Skin: Skin is warm Psychiatric: Patient has normal mood and affect    Ortho Exam: Ortho exam demonstrates antalgic shuffling type gait with pretty reasonable ankle dorsiflexion plantarflexion quad hamstring strength but does have some hip flexor weakness bilaterally.  Does report paresthesias in the left leg and left arm.  Bilateral upper extremity strength is pretty reasonable with deltoid biceps triceps wrist flexion extension testing.  Neck range of motion predictably diminished due to his fusion.  No hyperreflexia biceps triceps patella or Achilles.  Specialty Comments:  No specialty comments available.  Imaging: No results found.   PMFS History: Patient Active Problem List   Diagnosis Date Noted   Severe sepsis ([place]) 08/07/2019   Lobar pneumonia ([place]) 08/07/2019   Diarrhea 08/07/2019   Anemia 08/07/2019   Cervical myelopathy ([place]) 08/15/2018   Foot laceration, right, initial encounter    Radiculopathy 03/08/2018   Tubular adenoma 05/16/2017   Depressed mood 05/16/2017   Substance abuse ([place]) 05/16/2017   Hallux valgus 08/03/2016   Disc disorder    Lumbar facet arthropathy 08/06/2014   Lumbar radiculopathy 08/06/2014   ED (erectile dysfunction) 01/07/2014  Unspecified vitamin D deficiency 01/07/2014   Left shoulder pain 08/10/2013   Chronic low back pain 08/10/2013   Essential hypertension 08/10/2013   Tobacco abuse 08/10/2013   Past Medical History:  Diagnosis Date   Anxiety    Arthritis    back, shoulders    Constipation    Depression    Disc disorder    Hypertension    Lumbar herniated disc    Muscle spasm     Family History  Problem Relation Age of Onset    Diabetes Mother    Hypertension Mother    Heart disease Mother    Hyperlipidemia Mother    Diabetes Brother    Diabetes Daughter    Hypertension Sister    Diabetes Sister    Stomach cancer Maternal Grandfather    Colon cancer Neg Hx    Esophageal cancer Neg Hx    Rectal cancer Neg Hx    Colon polyps Neg Hx     Past Surgical History:  Procedure Laterality Date   ANTERIOR CERVICAL DECOMP/DISCECTOMY FUSION N/A 03/08/2018   Procedure: ANTERIOR CERVICAL DECOMPRESSION/DISCECTOMY FUSION , cervical 3-4, cervical 4-5, cervical 5-6 with intrumentational and allograft;  Surgeon: Phylliss Bob, MD;  Location: [facility];  Service: Orthopedics;  Laterality: N/A;   BONE BIOPSY Right 05/17/2018   Procedure: BONE BIOPSY X2;  Surgeon: Evelina Bucy, DPM;  Location: [facility];  Service: Podiatry;  Laterality: Right;  right second toe   COLONOSCOPY     FOOT SURGERY     right, ~ 2002   POLYPECTOMY     Social History   Occupational History   Not on file  Tobacco Use   Smoking status: Current Every Day Smoker    Packs/day: 0.10    Years: 20.00    Pack years: 2.00    Types: Cigarettes   Smokeless tobacco: Never Used   Tobacco comment: 5 cigs a day   Vaping Use   Vaping Use: Never used  Substance and Sexual Activity   Alcohol use: No   Drug use: No   Sexual activity: Not Currently

## 2020-05-20 ENCOUNTER — Other Ambulatory Visit: Payer: Self-pay

## 2020-05-20 ENCOUNTER — Ambulatory Visit (INDEPENDENT_AMBULATORY_CARE_PROVIDER_SITE_OTHER): Payer: Medicare Other | Admitting: Podiatry

## 2020-05-20 DIAGNOSIS — I872 Venous insufficiency (chronic) (peripheral): Secondary | ICD-10-CM | POA: Diagnosis not present

## 2020-05-20 DIAGNOSIS — R600 Localized edema: Secondary | ICD-10-CM | POA: Diagnosis not present

## 2020-05-21 ENCOUNTER — Other Ambulatory Visit: Payer: Self-pay | Admitting: Internal Medicine

## 2020-05-21 ENCOUNTER — Telehealth: Payer: Self-pay

## 2020-05-21 MED ORDER — HYDROCHLOROTHIAZIDE 12.5 MG PO TABS: 12.5000 mg | ORAL_TABLET | Freq: Every day | ORAL | 3 refills | Status: DC

## 2020-05-21 MED FILL — HYDROCHLOROTHIAZIDE 12.5 MG: 12.5 | 30 days supply | Qty: 30 | Fill #0

## 2020-05-21 NOTE — Telephone Encounter (Signed)
Copied from [place] [id number]. Topic: General - Other >> May 20, 2020  4:52 PM Mcneil, Ja-Kwan wrote: Reason for CRM: Pt stated he was at the podiatrist office and they suggested that he schedule an appt with pcp due to feet swelling possibly from medications prescribed. Pt asked if a Rx for fluid pill could be prescribed to help with the swelling.

## 2020-05-21 NOTE — Addendum Note (Signed)
Addended by: Karle Plumber B on: 05/21/2020 11:56 AM   Modules accepted: Orders

## 2020-05-21 NOTE — Telephone Encounter (Signed)
Called pt , unable to reach left message to call back.

## 2020-05-21 NOTE — Telephone Encounter (Signed)
Called pt made aware of MD instructions. Verbalized understanding . Pt stated he already stopped amlodipine for few days . He will pick up tomorrow prescribed med tomorrow

## 2020-05-22 ENCOUNTER — Other Ambulatory Visit: Payer: Self-pay | Admitting: Internal Medicine

## 2020-05-22 DIAGNOSIS — R252 Cramp and spasm: Secondary | ICD-10-CM

## 2020-05-22 DIAGNOSIS — M5416 Radiculopathy, lumbar region: Secondary | ICD-10-CM

## 2020-05-22 MED FILL — CYCLOBENZAPRINE 5 MG TABLET: 5 | 15 days supply | Qty: 30 | Fill #2

## 2020-05-22 MED FILL — SERTRALINE HCL 50 MG TABLET: 50 | 30 days supply | Qty: 30 | Fill #2

## 2020-05-22 NOTE — Telephone Encounter (Signed)
Requested medication (s) are due for refill today: yes  Requested medication (s) are on the active medication list: yes  Last refill:  03/27/20 #30 2 refills  Future visit scheduled: yes  Notes to clinic:   not delegated per protocol     Requested Prescriptions  Pending Prescriptions Disp Refills   cyclobenzaprine (FLEXERIL) 5 MG tablet [Pharmacy Med Name: CYCLOBENZAPRINE 5 MG TABLET 5 Tablet] 30 tablet 0    Sig: Take 1 tablet (5 mg total) by mouth 2 (two) times daily as needed for muscle spasms.      Not Delegated - Analgesics:  Muscle Relaxants Failed - 05/22/2020  5:23 PM      Failed - This refill cannot be delegated      Passed - Valid encounter within last 6 months    Recent Outpatient Visits           1 month ago Essential hypertension   [place], [place]   8 months ago Leukocytosis, unspecified type   [place], [place], [place]   1 year ago Closed fracture of left hip, initial encounter [facility])   [place], Deborah B, MD   1 year ago Essential hypertension   [place], MD   2 years ago Pre-op examination   [facility], [place]       Future Appointments             In 1 week Thereasa Solo, [place]

## 2020-05-23 ENCOUNTER — Other Ambulatory Visit: Payer: Self-pay | Admitting: Internal Medicine

## 2020-05-25 NOTE — Progress Notes (Signed)
  Subjective:  Patient ID: Alias Villagran, male    DOB: 04-06-1961,  MRN: [id number]  Chief Complaint  Patient presents with  . Foot Swelling    Pt states swelling in feet after medication change.   59 y.o. male presents with the above complaint. History confirmed with patient. States the swelling comes and goes.  Objective:  Physical Exam: warm, good capillary refill, no trophic changes or ulcerative lesions, normal DP and PT pulses and normal sensory exam. Pitting edema BLE  Assessment:   1. Localized edema   2. Venous (peripheral) insufficiency      Plan:  Patient was evaluated and treated and all questions answered.  Peripheral edema -Educated on etiology -Dispense tubigrip -Discussed he should discuss diuretic with his PCP  Return in about 1 month (around 06/20/2020) for Edema f/u .

## 2020-06-03 ENCOUNTER — Telehealth: Payer: Self-pay | Admitting: Internal Medicine

## 2020-06-03 NOTE — Telephone Encounter (Signed)
Called patient letting them know their appointment was virtual, not to come into the office, and the nurse will call a few minutes before 9:50.

## 2020-06-04 ENCOUNTER — Ambulatory Visit: Payer: Medicare Other | Attending: Physician Assistant | Admitting: Physician Assistant

## 2020-06-04 ENCOUNTER — Other Ambulatory Visit: Payer: Self-pay

## 2020-07-02 MED FILL — CYCLOBENZAPRINE 5 MG TABLET: 5 | 15 days supply | Qty: 30 | Fill #0

## 2020-07-02 MED FILL — SERTRALINE HCL 50 MG TABLET: 50 | 30 days supply | Qty: 30 | Fill #3

## 2020-07-02 MED FILL — HYDROCHLOROTHIAZIDE 12.5 MG: 12.5 | 30 days supply | Qty: 30 | Fill #1

## 2020-07-15 ENCOUNTER — Ambulatory Visit: Payer: Medicare Other | Attending: Orthopedic Surgery | Admitting: Physical Therapy

## 2020-07-15 ENCOUNTER — Other Ambulatory Visit: Payer: Self-pay

## 2020-07-15 ENCOUNTER — Encounter: Payer: Self-pay | Admitting: Physical Therapy

## 2020-07-15 DIAGNOSIS — M25652 Stiffness of left hip, not elsewhere classified: Secondary | ICD-10-CM | POA: Insufficient documentation

## 2020-07-15 DIAGNOSIS — M6281 Muscle weakness (generalized): Secondary | ICD-10-CM | POA: Insufficient documentation

## 2020-07-15 DIAGNOSIS — M545 Low back pain, unspecified: Secondary | ICD-10-CM | POA: Diagnosis not present

## 2020-07-15 DIAGNOSIS — G8929 Other chronic pain: Secondary | ICD-10-CM | POA: Diagnosis present

## 2020-07-15 DIAGNOSIS — R262 Difficulty in walking, not elsewhere classified: Secondary | ICD-10-CM | POA: Diagnosis present

## 2020-07-15 NOTE — Therapy (Signed)
[place], [place], [address] Phone: [phone]   Fax:  [phone]  Physical Therapy Evaluation  Patient Details  Name: Calvin [name] MRN: [id number] Date of Birth: 01/24/1961 Referring Provider (PT): Phylliss Bob, MD   Encounter Date: 07/15/2020   PT End of Session - 07/15/20 1550    Visit Number 1    Number of Visits 21    Date for PT Re-Evaluation 09/27/20    Authorization Type UHC MCR/MCD    FOTO visit 6, PN visit 10    PT Start Time 1545    PT Stop Time 1627    PT Time Calculation (min) 42 min    Activity Tolerance Patient tolerated treatment well           Past Medical History:  Diagnosis Date   Anxiety    Arthritis    back, shoulders    Constipation    Depression    Disc disorder    Hypertension    Lumbar herniated disc    Muscle spasm     Past Surgical History:  Procedure Laterality Date   ANTERIOR CERVICAL DECOMP/DISCECTOMY FUSION N/A 03/08/2018   Procedure: ANTERIOR CERVICAL DECOMPRESSION/DISCECTOMY FUSION , cervical 3-4, cervical 4-5, cervical 5-6 with intrumentational and allograft;  Surgeon: Phylliss Bob, MD;  Location: [facility];  Service: Orthopedics;  Laterality: N/A;   BONE BIOPSY Right 05/17/2018   Procedure: BONE BIOPSY X2;  Surgeon: Evelina Bucy, DPM;  Location: [facility];  Service: Podiatry;  Laterality: Right;  right second toe   COLONOSCOPY     FOOT SURGERY     right, ~ 2002   POLYPECTOMY      There were no vitals filed for this visit.    Subjective Assessment - 07/15/20 1550    Subjective Aug 2019 had ACDF and fell and broke my hip right after that. I had a bone stimulator but that is about it. I did have some back pain prior to the hip but I think the pain I have now stems from my hip. My left leg was stiff prior to breaking hip but now it is not as bad as it was. Worse in the AM. I do a lot of walking- was doing a mile a day but  stopped about 3-4 mo ago. It is kind of like a dull pain more on the left lower back and into calves and thighs. I feel that if I can get Lt leg stronger I will be ok. does not feel like leg buckles. I do not use SPC around the house- only if I think I am going to be up and walking around for a while.    How long can you walk comfortably? 10 min    Patient Stated Goals get left leg stronger    Currently in Pain? Yes    Pain Score 6     Pain Location Back    Pain Orientation Left;Lower    Pain Descriptors / Indicators Aching;Dull    Pain Radiating Towards calves and sometimes thighs    Aggravating Factors  walking too long    Pain Relieving Factors rest              [facility] PT Assessment - 07/15/20 0001      Assessment   Medical Diagnosis LBP and lumbar radiculopathy    Referring Provider (PT) Phylliss Bob, MD    Onset Date/Surgical Date --   around 2014   Hand Dominance  Right    Prior Therapy maybe 2 or 3 weeks and was suggested to go to specialist bc it wasn't getting better      Precautions   Precautions None      Restrictions   Weight Bearing Restrictions No      Balance Screen   Has the patient fallen in the past 6 months No      [place] residence    Living Arrangements Other relatives    Additional Comments stairs in home      Prior Function   Level of Independence Independent    Vocation Requirements not working      Cognition   Overall Cognitive Status Within Functional Limits for tasks assessed      Observation/Other Assessments   Focus on Therapeutic Outcomes (FOTO)  48      Sensation   Additional Comments numbness in Rt leg all the time      ROM / Strength   AROM / PROM / Strength AROM;Strength      AROM   AROM Assessment Site Hip;Knee    Right/Left Hip Right;Left    Right Hip Extension 5    Right Hip Flexion 110    Right Hip ABduction --   WFL   Left Hip Extension 0    Left Hip Flexion 105    Left Hip  External Rotation  20    Left Hip Internal Rotation  30    Left Hip ABduction 0    Right/Left Knee Right;Left    Right Knee Extension 0    Right Knee Flexion 120    Left Knee Extension 0    Left Knee Flexion 105      Strength   Strength Assessment Site Hip;Knee    Right/Left Hip Right;Left    Right Hip Flexion 4/5    Right Hip Extension 4+/5    Right Hip ABduction 4/5    Left Hip Flexion 4+/5    Left Hip Extension 4-/5    Left Hip ABduction 4-/5      Ambulation/Gait   Gait Comments SPC in Rt hand, Lt leg lacking knee flexion at toe off and circumducts through swing through with hip hike                      Objective measurements completed on examination: See above findings.       [facility] Adult PT Treatment/Exercise - 07/15/20 0001      Exercises   Exercises Knee/Hip      Knee/Hip Exercises: Stretches   Active Hamstring Stretch Limitations 90/90 x5 ea    Hip Flexor Stretch Limitations mod thomas off lateral edge of table    Other Knee/Hip Stretches LTR    Other Knee/Hip Stretches butterfly in hooklying, standing lateral lunge for Adductor stretch      Knee/Hip Exercises: Standing   SLS with hand hold on chair- cues for level pelvis                  PT Education - 07/15/20 1719    Education Details anatomy of condition, POC, HEP, FOTO, exercise form/rationale    Person(s) Educated Patient    Methods Explanation;Demonstration;Tactile cues;Verbal cues;Handout    Comprehension Verbalized understanding;Returned demonstration;Verbal cues required;Tactile cues required;Need further instruction            PT Short Term Goals - 07/15/20 1725      PT SHORT TERM GOAL #1   Title  Pt will be independent with HEP as it has been established in the short term    Baseline began establishing at eval    Time 3    Period Weeks    Status New    Target Date 08/05/20      PT SHORT TERM GOAL #2   Title pt will demo toe off and knee flexion in gait     Baseline keeps Left leg very stiff at eval but does have necessary flexibility/ROM to do so    Time 3    Period Weeks    Status New    Target Date 08/05/20             PT Long Term Goals - 07/15/20 1723      PT LONG TERM GOAL #1   Title Pt will demo at least 10 deg of hip abd    Baseline 0 at eval    Time 10    Period Weeks    Status New    Target Date 09/27/20      PT LONG TERM GOAL #2   Title pt will be able to return to walking program & strengthening program for exercise, safely    Baseline stopped due to not knowing if it was ok and squats made him very sore.    Time 10    Period Weeks    Status New    Target Date 09/27/20      PT LONG TERM GOAL #3   Title gross LE strength to 5/5    Baseline see flowsheet    Time 10    Period Weeks    Status New    Target Date 09/27/20      PT LONG TERM GOAL #4   Title pt will demo ability to perform both static balance holds and dynamic walking with head turns with control of balance    Baseline unable at eval and feels his balance is poor    Time 10    Period Weeks    Status New    Target Date 09/27/20                  Plan - 07/15/20 1627    Clinical Impression Statement Pt presents to PT with complaints of Lt sided LBP, Lt hip stiffness and LE weakness that limit functional ability and ambulation. Knee ROM is good bilaterally but is greatly limited in hip abduction on the left side. Overall has good strength and does try to work on stretching at home. Provided with HEP that focused on improving LE flexibility and joint mobility. No s/s of neural involvement from lumbar spine with testing today. Balance not officially tested but noted that he moves slowly and relies on UE support for balance- will benefit from challenges to improve this. pt will benefit from skilled PT to address deficits and reach goals.    Personal Factors and Comorbidities Time since onset of injury/illness/exacerbation;Comorbidity 1     Comorbidities past hip fx    Examination-Activity Limitations Squat;Stairs;Lift;Bend;Stand;Locomotion Level;Transfers;Sit;Sleep    Examination-Participation Restrictions Other;Community Activity   exercise   Stability/Clinical Decision Making Stable/Uncomplicated    Clinical Decision Making Low    Rehab Potential Good    PT Frequency 2x / week    PT Duration Other (comment)   10 weeks   PT Treatment/Interventions ADLs/Self Care Home Management;Aquatic Therapy;Cryotherapy;Ultrasound;Traction;Moist Heat;Iontophoresis 4mg /ml Dexamethasone;Electrical Stimulation;Gait training;Stair training;Functional mobility training;Neuromuscular re-education;Balance training;Therapeutic exercise;Therapeutic activities;Patient/family education;Manual techniques;Passive range of motion;Dry needling;Taping;Spinal Manipulations;Joint Manipulations  PT Next Visit Plan manual for hip mobility, CKC hip activation, gait training    PT Home Exercise Plan [id number]    Consulted and Agree with Plan of Care Patient           Patient will benefit from skilled therapeutic intervention in order to improve the following deficits and impairments:  Abnormal gait,Decreased balance,Decreased endurance,Difficulty walking,Hypomobility,Increased muscle spasms,Impaired sensation,Improper body mechanics,Decreased range of motion,Decreased activity tolerance,Decreased strength,Impaired flexibility,Postural dysfunction,Pain  Visit Diagnosis: Chronic left-sided low back pain without sciatica - Plan: PT plan of care cert/re-cert  Stiffness of left hip, not elsewhere classified - Plan: PT plan of care cert/re-cert  Difficulty in walking, not elsewhere classified - Plan: PT plan of care cert/re-cert  Muscle weakness (generalized) - Plan: PT plan of care cert/re-cert     Problem List Patient Active Problem List   Diagnosis Date Noted   Severe sepsis ([place]) 08/07/2019   Lobar pneumonia ([place]) 08/07/2019   Diarrhea 08/07/2019    Anemia 08/07/2019   Cervical myelopathy ([place]) 08/15/2018   Foot laceration, right, initial encounter    Radiculopathy 03/08/2018   Tubular adenoma 05/16/2017   Depressed mood 05/16/2017   Substance abuse ([place]) 05/16/2017   Hallux valgus 08/03/2016   Disc disorder    Lumbar facet arthropathy 08/06/2014   Lumbar radiculopathy 08/06/2014   ED (erectile dysfunction) 01/07/2014   Unspecified vitamin D deficiency 01/07/2014   Left shoulder pain 08/10/2013   Chronic low back pain 08/10/2013   Essential hypertension 08/10/2013   Tobacco abuse 08/10/2013    Tirzah Fross C. Annison Birchard PT, DPT 07/15/20 5:29 PM   [facility] [address] [place], [place], [address] Phone: [phone]   Fax:  [phone]  Name: Calvin [name] MRN: [id number] Date of Birth: [birth year]

## 2020-07-21 ENCOUNTER — Ambulatory Visit: Payer: Medicare Other

## 2020-07-24 ENCOUNTER — Ambulatory Visit: Payer: Medicare Other | Admitting: Physical Therapy

## 2020-07-29 ENCOUNTER — Ambulatory Visit: Payer: Medicare Other | Attending: Orthopedic Surgery

## 2020-07-29 ENCOUNTER — Other Ambulatory Visit: Payer: Self-pay

## 2020-07-29 DIAGNOSIS — M6281 Muscle weakness (generalized): Secondary | ICD-10-CM | POA: Insufficient documentation

## 2020-07-29 DIAGNOSIS — M545 Low back pain, unspecified: Secondary | ICD-10-CM | POA: Diagnosis not present

## 2020-07-29 DIAGNOSIS — M25652 Stiffness of left hip, not elsewhere classified: Secondary | ICD-10-CM | POA: Diagnosis not present

## 2020-07-29 DIAGNOSIS — G8929 Other chronic pain: Secondary | ICD-10-CM | POA: Insufficient documentation

## 2020-07-29 DIAGNOSIS — R262 Difficulty in walking, not elsewhere classified: Secondary | ICD-10-CM | POA: Diagnosis not present

## 2020-07-29 NOTE — Therapy (Signed)
[place], [place], [address] Phone: [phone]   Fax:  [phone]  Physical Therapy Treatment  Patient Details  Name: Calvin [name] MRN: [id number] Date of Birth: 11/25/1960 Referring Provider (PT): Phylliss Bob, MD   Encounter Date: 07/29/2020   PT End of Session - 07/29/20 1926    Visit Number 2    Number of Visits 21    Date for PT Re-Evaluation 09/27/20    Authorization Type UHC MCR/MCD    FOTO visit 6, PN visit 10    PT Start Time 1846    PT Stop Time 1930    PT Time Calculation (min) 44 min    Activity Tolerance Patient tolerated treatment well    Behavior During Therapy [facility] for tasks assessed/performed           Past Medical History:  Diagnosis Date  . Anxiety   . Arthritis    back, shoulders   . Constipation   . Depression   . Disc disorder   . Hypertension   . Lumbar herniated disc   . Muscle spasm     Past Surgical History:  Procedure Laterality Date  . ANTERIOR CERVICAL DECOMP/DISCECTOMY FUSION N/A 03/08/2018   Procedure: ANTERIOR CERVICAL DECOMPRESSION/DISCECTOMY FUSION , cervical 3-4, cervical 4-5, cervical 5-6 with intrumentational and allograft;  Surgeon: Phylliss Bob, MD;  Location: [facility];  Service: Orthopedics;  Laterality: N/A;  . BONE BIOPSY Right 05/17/2018   Procedure: BONE BIOPSY X2;  Surgeon: Evelina Bucy, DPM;  Location: [facility];  Service: Podiatry;  Laterality: Right;  right second toe  . COLONOSCOPY    . FOOT SURGERY     right, ~ 2002  . POLYPECTOMY      There were no vitals filed for this visit.   Subjective Assessment - 07/29/20 1849    Subjective He reports very little pain in low back and back of both legs currently. He reports compliance with HEP and feels that this is helping some with pain.    Currently in Pain? Yes    Pain Score 4     Pain Location Leg    Pain Orientation Posterior;Left;Right    Pain Descriptors / Indicators  Numbness    Pain Type Chronic pain              [facility] PT Assessment - 07/29/20 0001      Palpation   Palpation comment true leg length measure: 36.5 in LLE, 37.5 in RLE                         [facility] Adult PT Treatment/Exercise - 07/29/20 0001      Self-Care   Other Self-Care Comments  see patient education      Lumbar Exercises: Stretches   Active Hamstring Stretch Limitations seated 30 sec each    Other Lumbar Stretch Exercise LTR with figure 4 2 min each    Other Lumbar Stretch Exercise adductor stretch 30 sec      Lumbar Exercises: Aerobic   Nustep level 5 LE only 5 min      Knee/Hip Exercises: Stretches   Other Knee/Hip Stretches SKC 90 sec      Manual Therapy   Manual therapy comments Hip PROM all planes. Grade II-III inferior hip mobilization Lt                  PT Education - 07/29/20 1930    Education  Details Review of current HEP. Education of purpose of heel lift and slow introduction of wear to improve tolerance.    Person(s) Educated Patient    Methods Explanation;Demonstration;Handout    Comprehension Verbalized understanding;Returned demonstration            PT Short Term Goals - 07/15/20 1725      PT SHORT TERM GOAL #1   Title Pt will be independent with HEP as it has been established in the short term    Baseline began establishing at eval    Time 3    Period Weeks    Status New    Target Date 08/05/20      PT SHORT TERM GOAL #2   Title pt will demo toe off and knee flexion in gait    Baseline keeps Left leg very stiff at eval but does have necessary flexibility/ROM to do so    Time 3    Period Weeks    Status New    Target Date 08/05/20             PT Long Term Goals - 07/15/20 1723      PT LONG TERM GOAL #1   Title Pt will demo at least 10 deg of hip abd    Baseline 0 at eval    Time 10    Period Weeks    Status New    Target Date 09/27/20      PT LONG TERM GOAL #2   Title pt will be able to return  to walking program & strengthening program for exercise, safely    Baseline stopped due to not knowing if it was ok and squats made him very sore.    Time 10    Period Weeks    Status New    Target Date 09/27/20      PT LONG TERM GOAL #3   Title gross LE strength to 5/5    Baseline see flowsheet    Time 10    Period Weeks    Status New    Target Date 09/27/20      PT LONG TERM GOAL #4   Title pt will demo ability to perform both static balance holds and dynamic walking with head turns with control of balance    Baseline unable at eval and feels his balance is poor    Time 10    Period Weeks    Status New    Target Date 09/27/20                 Plan - 07/29/20 1852    Clinical Impression Statement Patient has notable LLD (LLE shorter when measuring from ASIS to medial malleoli). Heel lift was issued to patient and he demonstrates slight increase in step length with heel lift inserted. He still has a significant antalgic gait with limited hip and knee flexion throughout gait cycle on LLE requiring continued focus at future sessions. Able to review HEP that was prescribed at initial evaluation with patient requiring minimal cues for proper setup. Good tolerance to manual therapy, though patient significantly limited in Lt hip rotation and abduction PROM.    Personal Factors and Comorbidities Time since onset of injury/illness/exacerbation;Comorbidity 1    Comorbidities past hip fx    Examination-Activity Limitations Squat;Stairs;Lift;Bend;Stand;Locomotion Level;Transfers;Sit;Sleep    Examination-Participation Restrictions Other;Community Activity   exercise   Stability/Clinical Decision Making Stable/Uncomplicated    Rehab Potential Good    PT Frequency 2x / week  PT Duration Other (comment)   10 weeks   PT Treatment/Interventions ADLs/Self Care Home Management;Aquatic Therapy;Cryotherapy;Ultrasound;Traction;Moist Heat;Iontophoresis 4mg /ml Dexamethasone;Electrical  Stimulation;Gait training;Stair training;Functional mobility training;Neuromuscular re-education;Balance training;Therapeutic exercise;Therapeutic activities;Patient/family education;Manual techniques;Passive range of motion;Dry needling;Taping;Spinal Manipulations;Joint Manipulations    PT Next Visit Plan assess response to heel lift, update HEP. gait training. manual therapy.    PT Home Exercise Plan [id number]    Consulted and Agree with Plan of Care Patient           Patient will benefit from skilled therapeutic intervention in order to improve the following deficits and impairments:  Abnormal gait,Decreased balance,Decreased endurance,Difficulty walking,Hypomobility,Increased muscle spasms,Impaired sensation,Improper body mechanics,Decreased range of motion,Decreased activity tolerance,Decreased strength,Impaired flexibility,Postural dysfunction,Pain  Visit Diagnosis: Chronic left-sided low back pain without sciatica  Stiffness of left hip, not elsewhere classified  Difficulty in walking, not elsewhere classified  Muscle weakness (generalized)     Problem List Patient Active Problem List   Diagnosis Date Noted  . Severe sepsis ([facility]) 08/07/2019  . Lobar pneumonia ([facility]) 08/07/2019  . Diarrhea 08/07/2019  . Anemia 08/07/2019  . Cervical myelopathy ([facility]) 08/15/2018  . Foot laceration, right, initial encounter   . Radiculopathy 03/08/2018  . Tubular adenoma 05/16/2017  . Depressed mood 05/16/2017  . Substance abuse ([facility]) 05/16/2017  . Hallux valgus 08/03/2016  . Disc disorder   . Lumbar facet arthropathy 08/06/2014  . Lumbar radiculopathy 08/06/2014  . ED (erectile dysfunction) 01/07/2014  . Unspecified vitamin D deficiency 01/07/2014  . Left shoulder pain 08/10/2013  . Chronic low back pain 08/10/2013  . Essential hypertension 08/10/2013  . Tobacco abuse 08/10/2013  08/12/2013, PT, DPT, ATC 07/29/20 7:34 PM  [facility] [address] [place], [place], [place] Phone: [phone]   Fax:  [phone]  Name: Calvin [name] MRN: Donne Anon Date of Birth: 02-Nov-1960

## 2020-08-01 MED FILL — SERTRALINE HCL 50 MG TABLET: 50 | 30 days supply | Qty: 30 | Fill #1

## 2020-08-01 MED FILL — CYCLOBENZAPRINE 5 MG TABLET: 5 | 15 days supply | Qty: 30 | Fill #1

## 2020-08-04 ENCOUNTER — Other Ambulatory Visit: Payer: Self-pay

## 2020-08-04 ENCOUNTER — Ambulatory Visit: Payer: Medicare Other

## 2020-08-04 DIAGNOSIS — R262 Difficulty in walking, not elsewhere classified: Secondary | ICD-10-CM

## 2020-08-04 DIAGNOSIS — M545 Low back pain, unspecified: Secondary | ICD-10-CM | POA: Diagnosis not present

## 2020-08-04 DIAGNOSIS — M25652 Stiffness of left hip, not elsewhere classified: Secondary | ICD-10-CM | POA: Diagnosis not present

## 2020-08-04 DIAGNOSIS — M6281 Muscle weakness (generalized): Secondary | ICD-10-CM

## 2020-08-04 DIAGNOSIS — G8929 Other chronic pain: Secondary | ICD-10-CM | POA: Diagnosis not present

## 2020-08-04 NOTE — Therapy (Signed)
[place], [place], [address] Phone: [phone]   Fax:  [phone]  Physical Therapy Treatment  Patient Details  Name: Calvin [name] MRN: [id number] Date of Birth: Mar 27, 1961 Referring Provider (PT): Phylliss Bob, MD   Encounter Date: 08/04/2020   PT End of Session - 08/04/20 1700    Visit Number 3    Number of Visits 21    Date for PT Re-Evaluation 09/27/20    Authorization Type UHC MCR/MCD    FOTO visit 6, PN visit 10    PT Start Time 1645   patient late   PT Stop Time 1720    PT Time Calculation (min) 35 min    Activity Tolerance Patient tolerated treatment well    Behavior During Therapy [facility] for tasks assessed/performed           Past Medical History:  Diagnosis Date  . Anxiety   . Arthritis    back, shoulders   . Constipation   . Depression   . Disc disorder   . Hypertension   . Lumbar herniated disc   . Muscle spasm     Past Surgical History:  Procedure Laterality Date  . ANTERIOR CERVICAL DECOMP/DISCECTOMY FUSION N/A 03/08/2018   Procedure: ANTERIOR CERVICAL DECOMPRESSION/DISCECTOMY FUSION , cervical 3-4, cervical 4-5, cervical 5-6 with intrumentational and allograft;  Surgeon: Phylliss Bob, MD;  Location: [place];  Service: Orthopedics;  Laterality: N/A;  . BONE BIOPSY Right 05/17/2018   Procedure: BONE BIOPSY X2;  Surgeon: Evelina Bucy, DPM;  Location: [place];  Service: Podiatry;  Laterality: Right;  right second toe  . COLONOSCOPY    . FOOT SURGERY     right, ~ 2002  . POLYPECTOMY      There were no vitals filed for this visit.   Subjective Assessment - 08/04/20 1646    Subjective Patient reports he is starting to feel better and has very little pain currently. He feels the heel lift is helping, but thinks it needs to be higher.    Currently in Pain? Yes    Pain Score 4     Pain Location Leg    Pain Orientation Posterior;Left;Right    Pain  Descriptors / Indicators Dull    Pain Type Chronic pain                             [facility] Adult PT Treatment/Exercise - 08/04/20 0001      Lumbar Exercises: Stretches   Sports administrator Limitations prone 90 sec each      Lumbar Exercises: Supine   Bridge Limitations 2 x 10      Knee/Hip Exercises: Aerobic   Stationary Bike level 4, 5 minutes      Knee/Hip Exercises: Standing   Gait Training focusing on decreasing trunk lean and improving knee/hip flexion on the LLE      Manual Therapy   Manual therapy comments Hip PROM all planes. Grade II-III inferior hip mobilization Lt                  PT Education - 08/04/20 1719    Education Details Recommendation to utilize prone lying. Added layer to heel lift to assist in normalizing leg length.    Person(s) Educated Patient    Methods Explanation    Comprehension Verbalized understanding            PT Short Term Goals - 07/15/20  [place] #1   Title Pt will be independent with HEP as it has been established in the short term    Baseline began establishing at eval    Time 3    Period Weeks    Status New    Target Date 08/05/20      PT SHORT TERM GOAL #2   Title pt will demo toe off and knee flexion in gait    Baseline keeps Left leg very stiff at eval but does have necessary flexibility/ROM to do so    Time 3    Period Weeks    Status New    Target Date 08/05/20             PT Long Term Goals - 07/15/20 1723      PT LONG TERM GOAL #1   Title Pt will demo at least 10 deg of hip abd    Baseline 0 at eval    Time 10    Period Weeks    Status New    Target Date 09/27/20      PT LONG TERM GOAL #2   Title pt will be able to return to walking program & strengthening program for exercise, safely    Baseline stopped due to not knowing if it was ok and squats made him very sore.    Time 10    Period Weeks    Status New    Target Date 09/27/20      PT LONG TERM GOAL #3    Title gross LE strength to 5/5    Baseline see flowsheet    Time 10    Period Weeks    Status New    Target Date 09/27/20      PT LONG TERM GOAL #4   Title pt will demo ability to perform both static balance holds and dynamic walking with head turns with control of balance    Baseline unable at eval and feels his balance is poor    Time 10    Period Weeks    Status New    Target Date 09/27/20                 Plan - 08/04/20 1701    Clinical Impression Statement Good tolerance to PROM with limitations remaining into abduction and extension. Patient encouraged to lie in prone occasionally throughout the day to assist in encouraging passive hip extension. Gait training focused on improving Lt knee/hip flexion during swing phase of gait with patient demonstrating slight improvement in gait pattern with continued practice and verbal/visual cueing. While patient's flexion appears limited during ambulation patient able to complete full revolution on recumbent bike without compensation.    Personal Factors and Comorbidities Time since onset of injury/illness/exacerbation;Comorbidity 1    Comorbidities past hip fx    Examination-Activity Limitations Squat;Stairs;Lift;Bend;Stand;Locomotion Level;Transfers;Sit;Sleep    Examination-Participation Restrictions Other;Community Activity   exercise   Stability/Clinical Decision Making Stable/Uncomplicated    Rehab Potential Good    PT Frequency 2x / week    PT Duration Other (comment)   10 weeks   PT Treatment/Interventions ADLs/Self Care Home Management;Aquatic Therapy;Cryotherapy;Ultrasound;Traction;Moist Heat;Iontophoresis 4mg /ml Dexamethasone;Electrical Stimulation;Gait training;Stair training;Functional mobility training;Neuromuscular re-education;Balance training;Therapeutic exercise;Therapeutic activities;Patient/family education;Manual techniques;Passive range of motion;Dry needling;Taping;Spinal Manipulations;Joint Manipulations    PT Next  Visit Plan assess reseponse to heel lift, updated HEP. Recumbent bike. Gait training with [place]  Consulted and Agree with Plan of Care Patient           Patient will benefit from skilled therapeutic intervention in order to improve the following deficits and impairments:  Abnormal gait,Decreased balance,Decreased endurance,Difficulty walking,Hypomobility,Increased muscle spasms,Impaired sensation,Improper body mechanics,Decreased range of motion,Decreased activity tolerance,Decreased strength,Impaired flexibility,Postural dysfunction,Pain  Visit Diagnosis: Chronic left-sided low back pain without sciatica  Stiffness of left hip, not elsewhere classified  Difficulty in walking, not elsewhere classified  Muscle weakness (generalized)     Problem List Patient Active Problem List   Diagnosis Date Noted  . Severe sepsis ([place]) 08/07/2019  . Lobar pneumonia ([place]) 08/07/2019  . Diarrhea 08/07/2019  . Anemia 08/07/2019  . Cervical myelopathy ([place]) 08/15/2018  . Foot laceration, right, initial encounter   . Radiculopathy 03/08/2018  . Tubular adenoma 05/16/2017  . Depressed mood 05/16/2017  . Substance abuse ([place]) 05/16/2017  . Hallux valgus 08/03/2016  . Disc disorder   . Lumbar facet arthropathy 08/06/2014  . Lumbar radiculopathy 08/06/2014  . ED (erectile dysfunction) 01/07/2014  . Unspecified vitamin D deficiency 01/07/2014  . Left shoulder pain 08/10/2013  . Chronic low back pain 08/10/2013  . Essential hypertension 08/10/2013  . Tobacco abuse 08/10/2013   Gwendolyn Grant, PT, DPT, ATC 08/04/20 5:25 PM  [place] [facility] [address] [place], [place], [address] Phone: [phone]   Fax:  [phone]  Name: Calvin [name] MRN: [id number] Date of Birth: August 16, 1960

## 2020-08-06 ENCOUNTER — Other Ambulatory Visit: Payer: Self-pay

## 2020-08-06 ENCOUNTER — Ambulatory Visit: Payer: Medicare Other

## 2020-08-06 DIAGNOSIS — G8929 Other chronic pain: Secondary | ICD-10-CM | POA: Diagnosis not present

## 2020-08-06 DIAGNOSIS — R262 Difficulty in walking, not elsewhere classified: Secondary | ICD-10-CM | POA: Diagnosis not present

## 2020-08-06 DIAGNOSIS — M545 Low back pain, unspecified: Secondary | ICD-10-CM | POA: Diagnosis not present

## 2020-08-06 DIAGNOSIS — M6281 Muscle weakness (generalized): Secondary | ICD-10-CM

## 2020-08-06 DIAGNOSIS — M25652 Stiffness of left hip, not elsewhere classified: Secondary | ICD-10-CM | POA: Diagnosis not present

## 2020-08-06 NOTE — Therapy (Signed)
[place], [place], [address] Phone: [phone]   Fax:  [phone]  Physical Therapy Treatment  Patient Details  Name: Calvin [name] MRN: [id number] Date of Birth: 1960/12/11 Referring Provider (PT): Phylliss Bob, MD   Encounter Date: 08/06/2020   PT End of Session - 08/06/20 1651    Visit Number 4    Number of Visits 21    Date for PT Re-Evaluation 09/27/20    Authorization Type UHC MCR/MCD    FOTO visit 6, PN visit 10    PT Start Time 1631    PT Stop Time 1715    PT Time Calculation (min) 44 min    Activity Tolerance Patient tolerated treatment well    Behavior During Therapy [facility] for tasks assessed/performed           Past Medical History:  Diagnosis Date  . Anxiety   . Arthritis    back, shoulders   . Constipation   . Depression   . Disc disorder   . Hypertension   . Lumbar herniated disc   . Muscle spasm     Past Surgical History:  Procedure Laterality Date  . ANTERIOR CERVICAL DECOMP/DISCECTOMY FUSION N/A 03/08/2018   Procedure: ANTERIOR CERVICAL DECOMPRESSION/DISCECTOMY FUSION , cervical 3-4, cervical 4-5, cervical 5-6 with intrumentational and allograft;  Surgeon: Phylliss Bob, MD;  Location: [place];  Service: Orthopedics;  Laterality: N/A;  . BONE BIOPSY Right 05/17/2018   Procedure: BONE BIOPSY X2;  Surgeon: Evelina Bucy, DPM;  Location: [place];  Service: Podiatry;  Laterality: Right;  right second toe  . COLONOSCOPY    . FOOT SURGERY     right, ~ 2002  . POLYPECTOMY      There were no vitals filed for this visit.   Subjective Assessment - 08/06/20 1649    Subjective Patient reports he has been doing well without reports of pain and has been working on trying to improve his gait.    Currently in Pain? No/denies              [facility] PT Assessment - 08/06/20 0001      AROM   Left Hip Flexion 105    Left Hip ABduction 5    Left Knee Flexion 110                          [facility] Adult PT Treatment/Exercise - 08/06/20 0001      Lumbar Exercises: Standing   Heel Raises Limitations 2 x 20      Knee/Hip Exercises: Stretches   Sports administrator Limitations prone 90 sec each      Knee/Hip Exercises: Aerobic   Stationary Bike level 4, 5 minutes      Knee/Hip Exercises: Standing   Gait Training focusing on improving toe off and hip/knee flexion on LLE in //bars with mirror for feedback      Knee/Hip Exercises: Supine   Bridges Limitations 2 x 15      Knee/Hip Exercises: Sidelying   Hip ABduction Limitations 2 x 10      Manual Therapy   Manual therapy comments Hip PROM all planes. Grade II-III inferior hip mobilization Lt                  PT Education - 08/06/20 1712    Education Details Updated HEP.    Person(s) Educated Patient    Methods Explanation;Demonstration;Handout    Comprehension  Verbalized understanding;Returned demonstration            PT Short Term Goals - 07/15/20 1725      PT SHORT TERM GOAL #1   Title Pt will be independent with HEP as it has been established in the short term    Baseline began establishing at eval    Time 3    Period Weeks    Status New    Target Date 08/05/20      PT SHORT TERM GOAL #2   Title pt will demo toe off and knee flexion in gait    Baseline keeps Left leg very stiff at eval but does have necessary flexibility/ROM to do so    Time 3    Period Weeks    Status New    Target Date 08/05/20             PT Long Term Goals - 07/15/20 1723      PT LONG TERM GOAL #1   Title Pt will demo at least 10 deg of hip abd    Baseline 0 at eval    Time 10    Period Weeks    Status New    Target Date 09/27/20      PT LONG TERM GOAL #2   Title pt will be able to return to walking program & strengthening program for exercise, safely    Baseline stopped due to not knowing if it was ok and squats made him very sore.    Time 10    Period Weeks    Status New     Target Date 09/27/20      PT LONG TERM GOAL #3   Title gross LE strength to 5/5    Baseline see flowsheet    Time 10    Period Weeks    Status New    Target Date 09/27/20      PT LONG TERM GOAL #4   Title pt will demo ability to perform both static balance holds and dynamic walking with head turns with control of balance    Baseline unable at eval and feels his balance is poor    Time 10    Period Weeks    Status New    Target Date 09/27/20                 Plan - 08/06/20 1650    Clinical Impression Statement Patient demonstrates slight improvement in hip abduction and knee flexion AROM compared to initial evaluation, though still lacks function ROM in the Lt hip and knee. Patient quickly fatigues with targeted hip abductor strengthening having difficulty controlling eccentric phase towards end of prescribed reps. Patient able to demo appropriate knee flexion and hip flexion during gait training in parallel bars, though minimal carry over with ambulation at end of session.    Personal Factors and Comorbidities Time since onset of injury/illness/exacerbation;Comorbidity 1    Comorbidities past hip fx    Examination-Activity Limitations Squat;Stairs;Lift;Bend;Stand;Locomotion Level;Transfers;Sit;Sleep    Examination-Participation Restrictions Other;Community Activity   exercise   Stability/Clinical Decision Making Stable/Uncomplicated    Rehab Potential Good    PT Frequency 2x / week    PT Duration Other (comment)   10 weeks   PT Treatment/Interventions ADLs/Self Care Home Management;Aquatic Therapy;Cryotherapy;Ultrasound;Traction;Moist Heat;Iontophoresis 4mg /ml Dexamethasone;Electrical Stimulation;Gait training;Stair training;Functional mobility training;Neuromuscular re-education;Balance training;Therapeutic exercise;Therapeutic activities;Patient/family education;Manual techniques;Passive range of motion;Dry needling;Taping;Spinal Manipulations;Joint Manipulations    PT Next  Visit Plan gait training with mirror, progress hip strengthening,  manual therapy to improve Lt hip ROM    PT Home Exercise Plan [email or web address]    Consulted and Agree with Plan of Care Patient           Patient will benefit from skilled therapeutic intervention in order to improve the following deficits and impairments:  Abnormal gait,Decreased balance,Decreased endurance,Difficulty walking,Hypomobility,Increased muscle spasms,Impaired sensation,Improper body mechanics,Decreased range of motion,Decreased activity tolerance,Decreased strength,Impaired flexibility,Postural dysfunction,Pain  Visit Diagnosis: Chronic left-sided low back pain without sciatica  Stiffness of left hip, not elsewhere classified  Difficulty in walking, not elsewhere classified  Muscle weakness (generalized)     Problem List Patient Active Problem List   Diagnosis Date Noted  . Severe sepsis ([place]) 08/07/2019  . Lobar pneumonia ([place]) 08/07/2019  . Diarrhea 08/07/2019  . Anemia 08/07/2019  . Cervical myelopathy ([place]) 08/15/2018  . Foot laceration, right, initial encounter   . Radiculopathy 03/08/2018  . Tubular adenoma 05/16/2017  . Depressed mood 05/16/2017  . Substance abuse ([place]) 05/16/2017  . Hallux valgus 08/03/2016  . Disc disorder   . Lumbar facet arthropathy 08/06/2014  . Lumbar radiculopathy 08/06/2014  . ED (erectile dysfunction) 01/07/2014  . Unspecified vitamin D deficiency 01/07/2014  . Left shoulder pain 08/10/2013  . Chronic low back pain 08/10/2013  . Essential hypertension 08/10/2013  . Tobacco abuse 08/10/2013   Gwendolyn Grant, PT, DPT, ATC 08/06/20 5:16 PM [facility] [address] [place], [place], [address] Phone: [phone]   Fax:  [phone]  Name: Rashid Whitenight MRN: [id number] Date of Birth: Mar 27, 1961

## 2020-08-11 ENCOUNTER — Ambulatory Visit: Payer: Medicare Other

## 2020-08-13 ENCOUNTER — Other Ambulatory Visit: Payer: Self-pay

## 2020-08-13 ENCOUNTER — Ambulatory Visit: Payer: Medicare Other

## 2020-08-13 DIAGNOSIS — G8929 Other chronic pain: Secondary | ICD-10-CM | POA: Diagnosis not present

## 2020-08-13 DIAGNOSIS — M6281 Muscle weakness (generalized): Secondary | ICD-10-CM | POA: Diagnosis not present

## 2020-08-13 DIAGNOSIS — R262 Difficulty in walking, not elsewhere classified: Secondary | ICD-10-CM

## 2020-08-13 DIAGNOSIS — M25652 Stiffness of left hip, not elsewhere classified: Secondary | ICD-10-CM

## 2020-08-13 DIAGNOSIS — M545 Low back pain, unspecified: Secondary | ICD-10-CM | POA: Diagnosis not present

## 2020-08-13 NOTE — Therapy (Signed)
[place], [place], [address] Phone: [phone]   Fax:  [phone]  Physical Therapy Treatment  Patient Details  Name: Calvin [name] MRN: [id number] Date of Birth: 31-May-1961 Referring Provider (PT): Phylliss Bob, MD   Encounter Date: 08/13/2020   PT End of Session - 08/13/20 1616    Visit Number 5    Number of Visits 21    Date for PT Re-Evaluation 09/27/20    Authorization Type UHC MCR/MCD    FOTO visit 6, PN visit 10    PT Start Time 1600   patient late   PT Stop Time 1630    PT Time Calculation (min) 30 min    Activity Tolerance Patient tolerated treatment well    Behavior During Therapy [facility] for tasks assessed/performed           Past Medical History:  Diagnosis Date  . Anxiety   . Arthritis    back, shoulders   . Constipation   . Depression   . Disc disorder   . Hypertension   . Lumbar herniated disc   . Muscle spasm     Past Surgical History:  Procedure Laterality Date  . ANTERIOR CERVICAL DECOMP/DISCECTOMY FUSION N/A 03/08/2018   Procedure: ANTERIOR CERVICAL DECOMPRESSION/DISCECTOMY FUSION , cervical 3-4, cervical 4-5, cervical 5-6 with intrumentational and allograft;  Surgeon: Phylliss Bob, MD;  Location: [place];  Service: Orthopedics;  Laterality: N/A;  . BONE BIOPSY Right 05/17/2018   Procedure: BONE BIOPSY X2;  Surgeon: Evelina Bucy, DPM;  Location: [place];  Service: Podiatry;  Laterality: Right;  right second toe  . COLONOSCOPY    . FOOT SURGERY     right, ~ 2002  . POLYPECTOMY      There were no vitals filed for this visit.   Subjective Assessment - 08/13/20 1608    Subjective Patient reports he is feeling good today without reports of pain. He reports a fall this morning in the snow, but denies any pain from this incident. He reports compliance with HEP.    Currently in Pain? No/denies                             [facility] Adult PT  Treatment/Exercise - 08/13/20 0001      Lumbar Exercises: Aerobic   Recumbent Bike 5 minutes level 4      Lumbar Exercises: Standing   Heel Raises Limitations 2 x 20      Knee/Hip Exercises: Standing   Gait Training forward and lateral step overs hurdle 2 x 10 each, reciprocal forward step over x 6 hurdles 4 sets d/b; focusing on improving hip/knee flexion    Other Standing Knee Exercises standing march 2 x 10                    PT Short Term Goals - 07/15/20 1725      PT SHORT TERM GOAL #1   Title Pt will be independent with HEP as it has been established in the short term    Baseline began establishing at eval    Time 3    Period Weeks    Status New    Target Date 08/05/20      PT SHORT TERM GOAL #2   Title pt will demo toe off and knee flexion in gait    Baseline keeps Left leg very stiff at eval but does have  necessary flexibility/ROM to do so    Time 3    Period Weeks    Status New    Target Date 08/05/20             PT Long Term Goals - 07/15/20 1723      PT LONG TERM GOAL #1   Title Pt will demo at least 10 deg of hip abd    Baseline 0 at eval    Time 10    Period Weeks    Status New    Target Date 09/27/20      PT LONG TERM GOAL #2   Title pt will be able to return to walking program & strengthening program for exercise, safely    Baseline stopped due to not knowing if it was ok and squats made him very sore.    Time 10    Period Weeks    Status New    Target Date 09/27/20      PT LONG TERM GOAL #3   Title gross LE strength to 5/5    Baseline see flowsheet    Time 10    Period Weeks    Status New    Target Date 09/27/20      PT LONG TERM GOAL #4   Title pt will demo ability to perform both static balance holds and dynamic walking with head turns with control of balance    Baseline unable at eval and feels his balance is poor    Time 10    Period Weeks    Status New    Target Date 09/27/20                 Plan - 08/13/20  1619    Clinical Impression Statement Patient demonstrates improved Lt step length compared to previous sessions, though continues to have limited knee/hip flexion during gait cycle on LLE. Session focused on gait training with use of hurdles to encourage synchronous hip/knee flexion with patient having difficulty completing reciprocal forward step overs with the LLE leading, demonstrating circumduction swing with mild ability to correct with continued practice.    Personal Factors and Comorbidities Time since onset of injury/illness/exacerbation;Comorbidity 1    Comorbidities past hip fx    Examination-Activity Limitations Squat;Stairs;Lift;Bend;Stand;Locomotion Level;Transfers;Sit;Sleep    Examination-Participation Restrictions Other;Community Activity   exercise   Stability/Clinical Decision Making Stable/Uncomplicated    Rehab Potential Good    PT Frequency 2x / week    PT Duration Other (comment)   10 weeks   PT Treatment/Interventions ADLs/Self Care Home Management;Aquatic Therapy;Cryotherapy;Ultrasound;Traction;Moist Heat;Iontophoresis 4mg /ml Dexamethasone;Electrical Stimulation;Gait training;Stair training;Functional mobility training;Neuromuscular re-education;Balance training;Therapeutic exercise;Therapeutic activities;Patient/family education;Manual techniques;Passive range of motion;Dry needling;Taping;Spinal Manipulations;Joint Manipulations    PT Next Visit Plan gait training with mirror, progress hip strengthening, manual therapy to improve Lt hip ROM    PT Home Exercise Plan [email or web address]    Consulted and Agree with Plan of Care Patient           Patient will benefit from skilled therapeutic intervention in order to improve the following deficits and impairments:  Abnormal gait,Decreased balance,Decreased endurance,Difficulty walking,Hypomobility,Increased muscle spasms,Impaired sensation,Improper body mechanics,Decreased range of motion,Decreased activity tolerance,Decreased  strength,Impaired flexibility,Postural dysfunction,Pain  Visit Diagnosis: Chronic left-sided low back pain without sciatica  Stiffness of left hip, not elsewhere classified  Difficulty in walking, not elsewhere classified  Muscle weakness (generalized)     Problem List Patient Active Problem List   Diagnosis Date Noted  . Severe sepsis ([place]) 08/07/2019  . Lobar pneumonia (  [place]) 08/07/2019  . Diarrhea 08/07/2019  . Anemia 08/07/2019  . Cervical myelopathy ([place]) 08/15/2018  . Foot laceration, right, initial encounter   . Radiculopathy 03/08/2018  . Tubular adenoma 05/16/2017  . Depressed mood 05/16/2017  . Substance abuse ([place]) 05/16/2017  . Hallux valgus 08/03/2016  . Disc disorder   . Lumbar facet arthropathy 08/06/2014  . Lumbar radiculopathy 08/06/2014  . ED (erectile dysfunction) 01/07/2014  . Unspecified vitamin D deficiency 01/07/2014  . Left shoulder pain 08/10/2013  . Chronic low back pain 08/10/2013  . Essential hypertension 08/10/2013  . Tobacco abuse 08/10/2013   Gwendolyn Grant, PT, DPT, ATC 08/13/20 4:35 PM  [facility] [address] [place], [place], [address] Phone: [phone]   Fax:  [phone]  Name: Calvin [name] MRN: [id number] Date of Birth: [birth year]

## 2020-08-15 ENCOUNTER — Ambulatory Visit: Payer: Medicare Other

## 2020-08-15 ENCOUNTER — Other Ambulatory Visit: Payer: Self-pay

## 2020-08-15 DIAGNOSIS — M25652 Stiffness of left hip, not elsewhere classified: Secondary | ICD-10-CM | POA: Diagnosis not present

## 2020-08-15 DIAGNOSIS — R262 Difficulty in walking, not elsewhere classified: Secondary | ICD-10-CM

## 2020-08-15 DIAGNOSIS — M6281 Muscle weakness (generalized): Secondary | ICD-10-CM

## 2020-08-15 DIAGNOSIS — G8929 Other chronic pain: Secondary | ICD-10-CM | POA: Diagnosis not present

## 2020-08-15 DIAGNOSIS — M545 Low back pain, unspecified: Secondary | ICD-10-CM | POA: Diagnosis not present

## 2020-08-15 NOTE — Therapy (Signed)
[facility] [address] [place], [place], [address] Phone: [phone]   Fax:  [phone]  Physical Therapy Treatment  Patient Details  Name: Calvin [name] MRN: [id number] Date of Birth: 02/25/1961 Referring Provider (PT): Estill Bamberg, MD   Encounter Date: 08/15/2020   PT End of Session - 08/15/20 1253    Visit Number 6    Number of Visits 21    Date for PT Re-Evaluation 09/27/20    Authorization Type UHC MCR/MCD    FOTO visit 6, PN visit 10    PT Start Time 1234    PT Stop Time 1315    PT Time Calculation (min) 41 min    Activity Tolerance Patient tolerated treatment well    Behavior During Therapy [facility] for tasks assessed/performed           Past Medical History:  Diagnosis Date   Anxiety    Arthritis    back, shoulders    Constipation    Depression    Disc disorder    Hypertension    Lumbar herniated disc    Muscle spasm     Past Surgical History:  Procedure Laterality Date   ANTERIOR CERVICAL DECOMP/DISCECTOMY FUSION N/A 03/08/2018   Procedure: ANTERIOR CERVICAL DECOMPRESSION/DISCECTOMY FUSION , cervical 3-4, cervical 4-5, cervical 5-6 with intrumentational and allograft;  Surgeon: Estill Bamberg, MD;  Location: [facility];  Service: Orthopedics;  Laterality: N/A;   BONE BIOPSY Right 05/17/2018   Procedure: BONE BIOPSY X2;  Surgeon: Park Liter, DPM;  Location: [facility];  Service: Podiatry;  Laterality: Right;  right second toe   COLONOSCOPY     FOOT SURGERY     right, ~ 2002   POLYPECTOMY      There were no vitals filed for this visit.   Subjective Assessment - 08/15/20 1241    Subjective Patient denies any pain and has been trying to focus on allowing the knee to bend when walking, but he is pretty stiff when he first transition to standing after sitting for awhile.    Currently in Pain? No/denies              [facility] PT Assessment - 08/15/20 0001       Observation/Other Assessments   Focus on Therapeutic Outcomes (FOTO)  57%      AROM   Left Hip Flexion 105    Left Hip ABduction 8    Left Knee Flexion 112                         [facility] Adult PT Treatment/Exercise - 08/15/20 0001      Self-Care   Other Self-Care Comments  see patient education      Knee/Hip Exercises: Aerobic   Stationary Bike level 5, 5 minutes      Knee/Hip Exercises: Machines for Strengthening   Cybex Leg Press 1 x 15, 1 x 10; 20 lbs with yellow TB at thighs      Knee/Hip Exercises: Seated   Sit to Sand 5 reps      Manual Therapy   Manual therapy comments Hip PROM all planes, passive stretching bilaterall quads and glutes.                  PT Education - 08/15/20 1438    Education Details FOTO results, review of current HEP. Postural education to allow for neutral hip alignment when sitting. Education on proper gait mechanics including heel  strike and toe off    Person(s) Educated Patient    Methods Explanation;Demonstration    Comprehension Verbalized understanding;Verbal cues required;Need further instruction;Returned demonstration            PT Short Term Goals - 08/15/20 1252      PT SHORT TERM GOAL #1   Title Pt will be independent with HEP as it has been established in the short term    Baseline --    Time 3    Period Weeks    Status Achieved    Target Date 08/05/20      PT SHORT TERM GOAL #2   Title pt will demo toe off and knee flexion in gait    Baseline 08/15/20: Requires intermittent cues to allow for heel strike and knee flexion though has ROM to do so.    Time 3    Period Weeks    Status On-going    Target Date 08/05/20             PT Long Term Goals - 08/15/20 1315      PT LONG TERM GOAL #1   Title Pt will demo at least 10 deg of hip abd    Baseline 0 at eval    Time 10    Period Weeks    Status On-going      PT LONG TERM GOAL #2   Title pt will be [name] to return to walking program &  strengthening program for exercise, safely    Baseline stopped due to not knowing if it was ok and squats made him very sore.    Time 10    Period Weeks    Status Deferred      PT LONG TERM GOAL #3   Title gross LE strength to 5/5    Baseline see flowsheet    Time 10    Period Weeks    Status Deferred      PT LONG TERM GOAL #4   Title pt will demo ability to perform both static balance holds and dynamic walking with head turns with control of balance    Baseline unable at eval and feels his balance is poor    Time 10    Period Weeks    Status Deferred                 Plan - 08/15/20 1303    Clinical Impression Statement Good tolerance to manual therapy, though limited Lt hip PROM in all planes with firm end feel present. Introduction to closed chain strengthening with patient having difficulty controlling Lt knee valgus/hip adduction during leg press with ability to correct with tactile cueing. He quickly fatigues in the LLE with CKC activity, though denies any pain. His hip and knee flexion AROM has remained unchanged since initial evaluation, though he has enough ROM to allow for appropriate knee/hip flexion during gait cycle, though still lacks knee and hip flexion during stance and swing phase on the LLE.    Personal Factors and Comorbidities Time since onset of injury/illness/exacerbation;Comorbidity 1    Comorbidities past hip fx    Examination-Activity Limitations Squat;Stairs;Lift;Bend;Stand;Locomotion Level;Transfers;Sit;Sleep    Examination-Participation Restrictions Other;Community Activity   exercise   Stability/Clinical Decision Making Stable/Uncomplicated    Rehab Potential Good    PT Frequency 2x / week    PT Duration Other (comment)   10 weeks   PT Treatment/Interventions ADLs/Self Care Home Management;Aquatic Therapy;Cryotherapy;Ultrasound;Traction;Moist Heat;Iontophoresis 4mg /ml Dexamethasone;Electrical Stimulation;Gait training;Stair training;Functional  mobility training;Neuromuscular re-education;Balance  training;Therapeutic exercise;Therapeutic activities;Patient/family education;Manual techniques;Passive range of motion;Dry needling;Taping;Spinal Manipulations;Joint Manipulations    PT Next Visit Plan gait training with mirror, progress hip strengthening, manual therapy to improve Lt hip ROM    PT Home Exercise Plan [email or web address]    Consulted and Agree with Plan of Care Patient           Patient will benefit from skilled therapeutic intervention in order to improve the following deficits and impairments:  Abnormal gait,Decreased balance,Decreased endurance,Difficulty walking,Hypomobility,Increased muscle spasms,Impaired sensation,Improper body mechanics,Decreased range of motion,Decreased activity tolerance,Decreased strength,Impaired flexibility,Postural dysfunction,Pain  Visit Diagnosis: Chronic left-sided low back pain without sciatica  Stiffness of left hip, not elsewhere classified  Difficulty in walking, not elsewhere classified  Muscle weakness (generalized)     Problem List Patient Active Problem List   Diagnosis Date Noted   Severe sepsis ([place]) 08/07/2019   Lobar pneumonia ([place]) 08/07/2019   Diarrhea 08/07/2019   Anemia 08/07/2019   Cervical myelopathy ([place]) 08/15/2018   Foot laceration, right, initial encounter    Radiculopathy 03/08/2018   Tubular adenoma 05/16/2017   Depressed mood 05/16/2017   Substance abuse ([place]) 05/16/2017   Hallux valgus 08/03/2016   Disc disorder    Lumbar facet arthropathy 08/06/2014   Lumbar radiculopathy 08/06/2014   ED (erectile dysfunction) 01/07/2014   Unspecified vitamin D deficiency 01/07/2014   Left shoulder pain 08/10/2013   Chronic low back pain 08/10/2013   Essential hypertension 08/10/2013   Tobacco abuse 08/10/2013   Gwendolyn Grant, PT, DPT, ATC 08/15/20 2:44 PM  [facility] [address] [place], [place], [address] Phone: [phone]   Fax:  [phone]  Name: Calvin [name] MRN: [id number] Date of Birth: 10/20/1960

## 2020-08-18 ENCOUNTER — Other Ambulatory Visit: Payer: Self-pay

## 2020-08-18 ENCOUNTER — Ambulatory Visit: Payer: Medicare Other

## 2020-08-18 DIAGNOSIS — R262 Difficulty in walking, not elsewhere classified: Secondary | ICD-10-CM | POA: Diagnosis not present

## 2020-08-18 DIAGNOSIS — M545 Low back pain, unspecified: Secondary | ICD-10-CM

## 2020-08-18 DIAGNOSIS — M6281 Muscle weakness (generalized): Secondary | ICD-10-CM | POA: Diagnosis not present

## 2020-08-18 DIAGNOSIS — G8929 Other chronic pain: Secondary | ICD-10-CM | POA: Diagnosis not present

## 2020-08-18 DIAGNOSIS — M25652 Stiffness of left hip, not elsewhere classified: Secondary | ICD-10-CM

## 2020-08-18 NOTE — Therapy (Signed)
[facility] [place], [place], [address] Phone: [phone]   Fax:  [phone]  Physical Therapy Treatment  Patient Details  Name: Calvin [name] MRN: [id number] Date of Birth: [birth year] Referring Provider (PT): Phylliss Bob, MD   Encounter Date: 08/18/2020   PT End of Session - 08/18/20 1557    Visit Number 7    Number of Visits 21    Date for PT Re-Evaluation 09/27/20    Authorization Type UHC MCR/MCD    FOTO visit 6, PN visit 10    PT Start Time 1552   patient late   PT Stop Time 1636    PT Time Calculation (min) 44 min    Activity Tolerance Patient tolerated treatment well    Behavior During Therapy [facility] for tasks assessed/performed           Past Medical History:  Diagnosis Date  . Anxiety   . Arthritis    back, shoulders   . Constipation   . Depression   . Disc disorder   . Hypertension   . Lumbar herniated disc   . Muscle spasm     Past Surgical History:  Procedure Laterality Date  . ANTERIOR CERVICAL DECOMP/DISCECTOMY FUSION N/A 03/08/2018   Procedure: ANTERIOR CERVICAL DECOMPRESSION/DISCECTOMY FUSION , cervical 3-4, cervical 4-5, cervical 5-6 with intrumentational and allograft;  Surgeon: Phylliss Bob, MD;  Location: [facility];  Service: Orthopedics;  Laterality: N/A;  . BONE BIOPSY Right 05/17/2018   Procedure: BONE BIOPSY X2;  Surgeon: Evelina Bucy, DPM;  Location: [facility];  Service: Podiatry;  Laterality: Right;  right second toe  . COLONOSCOPY    . FOOT SURGERY     right, ~ 2002  . POLYPECTOMY      There were no vitals filed for this visit.   Subjective Assessment - 08/18/20 1556    Subjective Patient reports soreness along his sacrum attributed to his fall last week, but otherwise is feeling good.    Currently in Pain? Yes    Pain Score 5     Pain Location Sacrum    Pain Orientation Posterior    Pain Descriptors / Indicators Sore    Pain Type Acute pain     Pain Onset In the past 7 days                             [facility] Adult PT Treatment/Exercise - 08/18/20 0001      Self-Care   Other Self-Care Comments  see patient education      Lumbar Exercises: Public affairs consultant Limitations prone 90 sec each    Other Lumbar Stretch Exercise butterfly stretch 1 min      Lumbar Exercises: Sidelying   Clam Limitations 2 x 10      Lumbar Exercises: Prone   Other Prone Lumbar Exercises hip extension 2 x 10      Knee/Hip Exercises: Aerobic   Stationary Bike level 5, 5 minutes      Knee/Hip Exercises: Seated   Sit to Sand 2 sets;10 reps      Manual Therapy   Manual therapy comments Hip PROM all planes, passive stretching bilaterall quads and glutes.                  PT Education - 08/18/20 1612    Education Details education on use of pillows to recreate abductor wedge to promote passive prolonged stretch  at home. updated HEP.    Person(s) Educated Patient    Methods Explanation;Demonstration;Handout    Comprehension Verbalized understanding;Returned demonstration            PT Short Term Goals - 08/15/20 1252      PT SHORT TERM GOAL #1   Title Pt will be independent with HEP as it has been established in the short term    Baseline --    Time 3    Period Weeks    Status Achieved    Target Date 08/05/20      PT SHORT TERM GOAL #2   Title pt will demo toe off and knee flexion in gait    Baseline 08/15/20: Requires intermittent cues to allow for heel strike and knee flexion though has ROM to do so.    Time 3    Period Weeks    Status On-going    Target Date 08/05/20             PT Long Term Goals - 08/15/20 1315      PT LONG TERM GOAL #1   Title Pt will demo at least 10 deg of hip abd    Baseline 0 at eval    Time 10    Period Weeks    Status On-going      PT LONG TERM GOAL #2   Title pt will be able to return to walking program & strengthening program for exercise, safely    Baseline  stopped due to not knowing if it was ok and squats made him very sore.    Time 10    Period Weeks    Status Deferred      PT LONG TERM GOAL #3   Title gross LE strength to 5/5    Baseline see flowsheet    Time 10    Period Weeks    Status Deferred      PT LONG TERM GOAL #4   Title pt will demo ability to perform both static balance holds and dynamic walking with head turns with control of balance    Baseline unable at eval and feels his balance is poor    Time 10    Period Weeks    Status Deferred                 Plan - 08/18/20 1614    Clinical Impression Statement Patient tolerated session well today without increased reports of soreness. Patient has improved ability maintaining knee alignment during squat ascent, though continues to have difficulty controlling valgus collapse during squat descent on the LLE. Patient has difficulty completing isolated hip extension, with significant engagement of trunk musculature when he attempts prone hip extension on the LLE. With continued practice and verbal/tactile cueing patient able to better isolate hip extensors, though due to limited hip extension ROM can only complete partial range.    Personal Factors and Comorbidities Time since onset of injury/illness/exacerbation;Comorbidity 1    Comorbidities past hip fx    Examination-Activity Limitations Squat;Stairs;Lift;Bend;Stand;Locomotion Level;Transfers;Sit;Sleep    Examination-Participation Restrictions Other;Community Activity   exercise   Stability/Clinical Decision Making Stable/Uncomplicated    Rehab Potential Good    PT Frequency 2x / week    PT Duration Other (comment)   10 weeks   PT Treatment/Interventions ADLs/Self Care Home Management;Aquatic Therapy;Cryotherapy;Ultrasound;Traction;Moist Heat;Iontophoresis 4mg /ml Dexamethasone;Electrical Stimulation;Gait training;Stair training;Functional mobility training;Neuromuscular re-education;Balance training;Therapeutic  exercise;Therapeutic activities;Patient/family education;Manual techniques;Passive range of motion;Dry needling;Taping;Spinal Manipulations;Joint Manipulations    PT Next Visit Plan gait  training with mirror, progress hip strengthening, manual therapy to improve Lt hip ROM    PT Home Exercise Plan [email or web address]    Consulted and Agree with Plan of Care Patient           Patient will benefit from skilled therapeutic intervention in order to improve the following deficits and impairments:  Abnormal gait,Decreased balance,Decreased endurance,Difficulty walking,Hypomobility,Increased muscle spasms,Impaired sensation,Improper body mechanics,Decreased range of motion,Decreased activity tolerance,Decreased strength,Impaired flexibility,Postural dysfunction,Pain  Visit Diagnosis: Chronic left-sided low back pain without sciatica  Stiffness of left hip, not elsewhere classified  Difficulty in walking, not elsewhere classified  Muscle weakness (generalized)     Problem List Patient Active Problem List   Diagnosis Date Noted  . Severe sepsis ([place]) 08/07/2019  . Lobar pneumonia ([place]) 08/07/2019  . Diarrhea 08/07/2019  . Anemia 08/07/2019  . Cervical myelopathy ([place]) 08/15/2018  . Foot laceration, right, initial encounter   . Radiculopathy 03/08/2018  . Tubular adenoma 05/16/2017  . Depressed mood 05/16/2017  . Substance abuse ([place]) 05/16/2017  . Hallux valgus 08/03/2016  . Disc disorder   . Lumbar facet arthropathy 08/06/2014  . Lumbar radiculopathy 08/06/2014  . ED (erectile dysfunction) 01/07/2014  . Unspecified vitamin D deficiency 01/07/2014  . Left shoulder pain 08/10/2013  . Chronic low back pain 08/10/2013  . Essential hypertension 08/10/2013  . Tobacco abuse 08/10/2013   Gwendolyn Grant, PT, DPT, ATC 08/18/20 4:41 PM  [facility] [address] [place], [place], [address] Phone: [phone]   Fax:  [phone]  Name: Calvin [name] MRN: [id number] Date of Birth: 1960/12/10

## 2020-08-20 ENCOUNTER — Ambulatory Visit: Payer: Medicare Other

## 2020-08-20 ENCOUNTER — Other Ambulatory Visit: Payer: Self-pay

## 2020-08-20 DIAGNOSIS — M6281 Muscle weakness (generalized): Secondary | ICD-10-CM

## 2020-08-20 DIAGNOSIS — M25652 Stiffness of left hip, not elsewhere classified: Secondary | ICD-10-CM

## 2020-08-20 DIAGNOSIS — M545 Low back pain, unspecified: Secondary | ICD-10-CM | POA: Diagnosis not present

## 2020-08-20 DIAGNOSIS — G8929 Other chronic pain: Secondary | ICD-10-CM

## 2020-08-20 DIAGNOSIS — R262 Difficulty in walking, not elsewhere classified: Secondary | ICD-10-CM

## 2020-08-20 NOTE — Therapy (Signed)
[name], [place], [address] Phone: [phone]   Fax:  [phone]  Physical Therapy Treatment  Patient Details  Name: Calvin [name] MRN: [id number] Date of Birth: [birth year] Referring Provider (PT): Phylliss Bob, MD   Encounter Date: 08/20/2020   PT End of Session - 08/20/20 1650    Visit Number 8    Number of Visits 21    Date for PT Re-Evaluation 09/27/20    Authorization Type [facility] MCR/MCD    FOTO visit 6, PN visit 10    PT Start Time 1632    PT Stop Time 1716    PT Time Calculation (min) 44 min    Activity Tolerance Patient tolerated treatment well    Behavior During Therapy [facility] for tasks assessed/performed           Past Medical History:  Diagnosis Date  . Anxiety   . Arthritis    back, shoulders   . Constipation   . Depression   . Disc disorder   . Hypertension   . Lumbar herniated disc   . Muscle spasm     Past Surgical History:  Procedure Laterality Date  . ANTERIOR CERVICAL DECOMP/DISCECTOMY FUSION N/A 03/08/2018   Procedure: ANTERIOR CERVICAL DECOMPRESSION/DISCECTOMY FUSION , cervical 3-4, cervical 4-5, cervical 5-6 with intrumentational and allograft;  Surgeon: Phylliss Bob, MD;  Location: [facility];  Service: Orthopedics;  Laterality: N/A;  . BONE BIOPSY Right 05/17/2018   Procedure: BONE BIOPSY X2;  Surgeon: Evelina Bucy, DPM;  Location: [facility] [place];  Service: Podiatry;  Laterality: Right;  right second toe  . COLONOSCOPY    . FOOT SURGERY     right, ~ 2002  . POLYPECTOMY      There were no vitals filed for this visit.   Subjective Assessment - 08/20/20 1640    Subjective patient reports some muscle soreness in his hip, though otherwise is feeling good. he was able to walk around his house without his cane for the first time in 5 years and felt pretty good.    Currently in Pain? Yes    Pain Score 5     Pain Location Hip    Pain Orientation Posterior     Pain Descriptors / Indicators Sore    Pain Type Acute pain    Pain Onset In the past 7 days                             [facility] Adult PT Treatment/Exercise - 08/20/20 0001      Self-Care   Self-Care Posture    Posture mirror for visual feedback focusing on correcting standing postural abnormalities      Lumbar Exercises: Standing   Other Standing Lumbar Exercises Rt lateral shift correction 1 x 10      Lumbar Exercises: Prone   Other Prone Lumbar Exercises prone pressup 1 x 10      Lumbar Exercises: Quadruped   Other Quadruped Lumbar Exercises quadruped rocking 1 x 10      Knee/Hip Exercises: Stretches   Sports administrator Limitations prone 90 sec each      Knee/Hip Exercises: Aerobic   Stationary Bike level 5, 5 minutes      Knee/Hip Exercises: Standing   Gait Training with SPC focusing on heel strike and toe off and mirror for visual cues. forward step over 1 x 5 LLE    Other Standing Knee Exercises SLS  2 trials each      Manual Therapy   Manual therapy comments Hip PROM all planes, passive stretching bilaterall quads and glutes.                    PT Short Term Goals - 08/15/20 1252      PT SHORT TERM GOAL #1   Title Pt will be independent with HEP as it has been established in the short term    Baseline --    Time 3    Period Weeks    Status Achieved    Target Date 08/05/20      PT SHORT TERM GOAL #2   Title pt will demo toe off and knee flexion in gait    Baseline 08/15/20: Requires intermittent cues to allow for heel strike and knee flexion though has ROM to do so.    Time 3    Period Weeks    Status On-going    Target Date 08/05/20             PT Long Term Goals - 08/15/20 1315      PT LONG TERM GOAL #1   Title Pt will demo at least 10 deg of hip abd    Baseline 0 at eval    Time 10    Period Weeks    Status On-going      PT LONG TERM GOAL #2   Title pt will be able to return to walking program & strengthening program for  exercise, safely    Baseline stopped due to not knowing if it was ok and squats made him very sore.    Time 10    Period Weeks    Status Deferred      PT LONG TERM GOAL #3   Title gross LE strength to 5/5    Baseline see flowsheet    Time 10    Period Weeks    Status Deferred      PT LONG TERM GOAL #4   Title pt will demo ability to perform both static balance holds and dynamic walking with head turns with control of balance    Baseline unable at eval and feels his balance is poor    Time 10    Period Weeks    Status Deferred                 Plan - 08/20/20 1654    Clinical Impression Statement Session focused on improving static standing posture as he has Rt lateral shift and excessive trunk flexion that have been present since start of care. With mirror for visual feedback and tactile cues patient able to decrease Rt lateral shift and demonstrate more erect posture in standing with good carryover at end of session. Patient able to demonstrate heel strike during gait training, though continues to require heavy cues for proper toe off and knee flexion on the LLE.    Personal Factors and Comorbidities Time since onset of injury/illness/exacerbation;Comorbidity 1    Comorbidities past hip fx    Examination-Activity Limitations Squat;Stairs;Lift;Bend;Stand;Locomotion Level;Transfers;Sit;Sleep    Examination-Participation Restrictions Other;Community Activity   exercise   Stability/Clinical Decision Making Stable/Uncomplicated    Rehab Potential Good    PT Frequency 2x / week    PT Duration Other (comment)   10 weeks   PT Treatment/Interventions ADLs/Self Care Home Management;Aquatic Therapy;Cryotherapy;Ultrasound;Traction;Moist Heat;Iontophoresis 4mg /ml Dexamethasone;Electrical Stimulation;Gait training;Stair training;Functional mobility training;Neuromuscular re-education;Balance training;Therapeutic exercise;Therapeutic activities;Patient/family education;Manual  techniques;Passive range of motion;Dry needling;Taping;Spinal Manipulations;Joint  Manipulations    PT Next Visit Plan gait training with mirror, progress hip strengthening, manual therapy to improve Lt hip ROM    PT Home Exercise Plan [id number]    Consulted and Agree with Plan of Care Patient           Patient will benefit from skilled therapeutic intervention in order to improve the following deficits and impairments:  Abnormal gait,Decreased balance,Decreased endurance,Difficulty walking,Hypomobility,Increased muscle spasms,Impaired sensation,Improper body mechanics,Decreased range of motion,Decreased activity tolerance,Decreased strength,Impaired flexibility,Postural dysfunction,Pain  Visit Diagnosis: Chronic left-sided low back pain without sciatica  Stiffness of left hip, not elsewhere classified  Difficulty in walking, not elsewhere classified  Muscle weakness (generalized)     Problem List Patient Active Problem List   Diagnosis Date Noted  . Severe sepsis ([place]) 08/07/2019  . Lobar pneumonia ([place]) 08/07/2019  . Diarrhea 08/07/2019  . Anemia 08/07/2019  . Cervical myelopathy ([place]) 08/15/2018  . Foot laceration, right, initial encounter   . Radiculopathy 03/08/2018  . Tubular adenoma 05/16/2017  . Depressed mood 05/16/2017  . Substance abuse ([place]) 05/16/2017  . Hallux valgus 08/03/2016  . Disc disorder   . Lumbar facet arthropathy 08/06/2014  . Lumbar radiculopathy 08/06/2014  . ED (erectile dysfunction) 01/07/2014  . Unspecified vitamin D deficiency 01/07/2014  . Left shoulder pain 08/10/2013  . Chronic low back pain 08/10/2013  . Essential hypertension 08/10/2013  . Tobacco abuse 08/10/2013   Gwendolyn Grant, PT, DPT, ATC 08/20/20 5:30 PM  [facility] [address] [place], [place], [address] Phone: [phone]   Fax:  [phone]  Name: Calvin [name] MRN: [id number] Date of Birth: 1961-04-08

## 2020-08-25 ENCOUNTER — Ambulatory Visit: Payer: Medicare Other

## 2020-08-27 ENCOUNTER — Other Ambulatory Visit: Payer: Self-pay

## 2020-08-27 ENCOUNTER — Ambulatory Visit: Payer: Medicare Other | Attending: Orthopedic Surgery

## 2020-08-27 DIAGNOSIS — M25652 Stiffness of left hip, not elsewhere classified: Secondary | ICD-10-CM | POA: Insufficient documentation

## 2020-08-27 DIAGNOSIS — M545 Low back pain, unspecified: Secondary | ICD-10-CM | POA: Diagnosis not present

## 2020-08-27 DIAGNOSIS — R262 Difficulty in walking, not elsewhere classified: Secondary | ICD-10-CM | POA: Insufficient documentation

## 2020-08-27 DIAGNOSIS — G8929 Other chronic pain: Secondary | ICD-10-CM | POA: Insufficient documentation

## 2020-08-27 DIAGNOSIS — M6281 Muscle weakness (generalized): Secondary | ICD-10-CM | POA: Insufficient documentation

## 2020-08-27 NOTE — Therapy (Signed)
[place], [place], [address] Phone: [phone]   Fax:  [phone]  Physical Therapy Treatment  Patient Details  Name: Calvin [name] MRN: [id number] Date of Birth: 09/17/1960 Referring Provider (PT): Phylliss Bob, MD   Encounter Date: 08/27/2020   PT End of Session - 08/27/20 6962    Visit Number 9    Number of Visits 21    Date for PT Re-Evaluation 09/27/20    Authorization Type UHC MCR/MCD    FOTO visit 6, PN visit 10    PT Start Time 1627    PT Stop Time 1712    PT Time Calculation (min) 45 min    Activity Tolerance Patient tolerated treatment well    Behavior During Therapy [facility] for tasks assessed/performed           Past Medical History:  Diagnosis Date  . Anxiety   . Arthritis    back, shoulders   . Constipation   . Depression   . Disc disorder   . Hypertension   . Lumbar herniated disc   . Muscle spasm     Past Surgical History:  Procedure Laterality Date  . ANTERIOR CERVICAL DECOMP/DISCECTOMY FUSION N/A 03/08/2018   Procedure: ANTERIOR CERVICAL DECOMPRESSION/DISCECTOMY FUSION , cervical 3-4, cervical 4-5, cervical 5-6 with intrumentational and allograft;  Surgeon: Phylliss Bob, MD;  Location: [place];  Service: Orthopedics;  Laterality: N/A;  . BONE BIOPSY Right 05/17/2018   Procedure: BONE BIOPSY X2;  Surgeon: Evelina Bucy, DPM;  Location: [place];  Service: Podiatry;  Laterality: Right;  right second toe  . COLONOSCOPY    . FOOT SURGERY     right, ~ 2002  . POLYPECTOMY      There were no vitals filed for this visit.   Subjective Assessment - 08/27/20 1630    Subjective Patient reports mostly stiffness in his hip currently. He reports compliance with HEP. Patient reports soreness the following day after last session.    Currently in Pain? No/denies                             [facility] Adult PT Treatment/Exercise - 08/27/20 0001       Knee/Hip Exercises: Aerobic   Stationary Bike level 5, 5 minutes      Knee/Hip Exercises: Standing   Lateral Step Up Limitations lateral step overs hurdles 2 x 10    Forward Step Up Limitations forward and backward step overs small hurdle 2 x 10    Other Standing Knee Exercises HS curl 2 x 10      Knee/Hip Exercises: Seated   Sit to Sand 2 sets;10 reps   with overhead ball reach     Knee/Hip Exercises: Supine   Bridges Limitations 2 x 15      Knee/Hip Exercises: Sidelying   Hip ABduction Limitations 2 x 10 partial range      Manual Therapy   Manual therapy comments Hip PROM all planes, passive stretching bilaterall quads and glutes.A/P hip mobilizations grade II-IV                    PT Short Term Goals - 08/15/20 1252      PT SHORT TERM GOAL #1   Title Pt will be independent with HEP as it has been established in the short term    Baseline --    Time 3    Period  Weeks    Status Achieved    Target Date 08/05/20      PT SHORT TERM GOAL #2   Title pt will demo toe off and knee flexion in gait    Baseline 08/15/20: Requires intermittent cues to allow for heel strike and knee flexion though has ROM to do so.    Time 3    Period Weeks    Status On-going    Target Date 08/05/20             PT Long Term Goals - 08/15/20 1315      PT LONG TERM GOAL #1   Title Pt will demo at least 10 deg of hip abd    Baseline 0 at eval    Time 10    Period Weeks    Status On-going      PT LONG TERM GOAL #2   Title pt will be able to return to walking program & strengthening program for exercise, safely    Baseline stopped due to not knowing if it was ok and squats made him very sore.    Time 10    Period Weeks    Status Deferred      PT LONG TERM GOAL #3   Title gross LE strength to 5/5    Baseline see flowsheet    Time 10    Period Weeks    Status Deferred      PT LONG TERM GOAL #4   Title pt will demo ability to perform both static balance holds and dynamic  walking with head turns with control of balance    Baseline unable at eval and feels his balance is poor    Time 10    Period Weeks    Status Deferred                 Plan - 08/27/20 1631    Clinical Impression Statement Patient tolerated session well today without reports of hip pain. Patient challenged in completing knee flexion exercises while maintaining hip in neutral position as he has tendency to engage hip flexors when he attempts active knee flexion on the LLE. With cueing and continued practice patient able to maintain hip in neutral during standing hamstring curls. No circumduction noted during lateral overs today, which is a significant improvement from previous sessions. Occasional circumduction during forward step overs on the LLE, though patient able to self correct.    Personal Factors and Comorbidities Time since onset of injury/illness/exacerbation;Comorbidity 1    Comorbidities past hip fx    Examination-Activity Limitations Squat;Stairs;Lift;Bend;Stand;Locomotion Level;Transfers;Sit;Sleep    Examination-Participation Restrictions Other;Community Activity   exercise   Stability/Clinical Decision Making Stable/Uncomplicated    Rehab Potential Good    PT Frequency 2x / week    PT Duration Other (comment)   10 weeks   PT Treatment/Interventions ADLs/Self Care Home Management;Aquatic Therapy;Cryotherapy;Ultrasound;Traction;Moist Heat;Iontophoresis 4mg /ml Dexamethasone;Electrical Stimulation;Gait training;Stair training;Functional mobility training;Neuromuscular re-education;Balance training;Therapeutic exercise;Therapeutic activities;Patient/family education;Manual techniques;Passive range of motion;Dry needling;Taping;Spinal Manipulations;Joint Manipulations    PT Next Visit Plan progress note, gait training with mirror, progress hip strengthening, manual therapy to improve Lt hip ROM    PT Home Exercise Plan [id number]    Consulted and Agree with Plan of Care Patient            Patient will benefit from skilled therapeutic intervention in order to improve the following deficits and impairments:  Abnormal gait,Decreased balance,Decreased endurance,Difficulty walking,Hypomobility,Increased muscle spasms,Impaired sensation,Improper body mechanics,Decreased range of motion,Decreased activity tolerance,Decreased strength,Impaired  flexibility,Postural dysfunction,Pain  Visit Diagnosis: Chronic left-sided low back pain without sciatica  Stiffness of left hip, not elsewhere classified  Difficulty in walking, not elsewhere classified  Muscle weakness (generalized)     Problem List Patient Active Problem List   Diagnosis Date Noted  . Severe sepsis ([place]) 08/07/2019  . Lobar pneumonia ([place]) 08/07/2019  . Diarrhea 08/07/2019  . Anemia 08/07/2019  . Cervical myelopathy ([place]) 08/15/2018  . Foot laceration, right, initial encounter   . Radiculopathy 03/08/2018  . Tubular adenoma 05/16/2017  . Depressed mood 05/16/2017  . Substance abuse ([place]) 05/16/2017  . Hallux valgus 08/03/2016  . Disc disorder   . Lumbar facet arthropathy 08/06/2014  . Lumbar radiculopathy 08/06/2014  . ED (erectile dysfunction) 01/07/2014  . Unspecified vitamin D deficiency 01/07/2014  . Left shoulder pain 08/10/2013  . Chronic low back pain 08/10/2013  . Essential hypertension 08/10/2013  . Tobacco abuse 08/10/2013   Gwendolyn Grant, PT, DPT, ATC 08/27/20 5:15 PM  [facility] [address] [place], [place], [address] Phone: [phone]   Fax:  [phone]  Name: Nik Gorrell MRN: [id number] Date of Birth: 04/18/1961

## 2020-09-01 ENCOUNTER — Ambulatory Visit: Payer: Medicare Other

## 2020-09-03 ENCOUNTER — Ambulatory Visit: Payer: Medicare Other

## 2020-09-03 ENCOUNTER — Other Ambulatory Visit: Payer: Self-pay

## 2020-09-03 DIAGNOSIS — G8929 Other chronic pain: Secondary | ICD-10-CM

## 2020-09-03 DIAGNOSIS — M545 Low back pain, unspecified: Secondary | ICD-10-CM | POA: Diagnosis not present

## 2020-09-03 DIAGNOSIS — M6281 Muscle weakness (generalized): Secondary | ICD-10-CM

## 2020-09-03 DIAGNOSIS — R262 Difficulty in walking, not elsewhere classified: Secondary | ICD-10-CM

## 2020-09-03 DIAGNOSIS — M25652 Stiffness of left hip, not elsewhere classified: Secondary | ICD-10-CM

## 2020-09-03 NOTE — Therapy (Signed)
[name], [place], [address] Phone: [phone]   Fax:  [phone]  Physical Therapy Treatment Progress Note  Reporting Period 07/15/20 to 09/03/20  See note below for Objective Data and Assessment of Progress/Goals.       Patient Details  Name: Calvin [name] MRN: [id number] Date of Birth: 08/21/1960 Referring Provider (PT): Phylliss Bob, MD   Encounter Date: 09/03/2020   PT End of Session - 09/03/20 1650    Visit Number 10    Number of Visits 21    Date for PT Re-Evaluation 09/27/20    Authorization Type UHC MCR/MCD    Progress Note Due on Visit 20    PT Start Time 1633    PT Stop Time 1715    PT Time Calculation (min) 42 min    Activity Tolerance Patient tolerated treatment well    Behavior During Therapy [name] for tasks assessed/performed           Past Medical History:  Diagnosis Date  . Anxiety   . Arthritis    back, shoulders   . Constipation   . Depression   . Disc disorder   . Hypertension   . Lumbar herniated disc   . Muscle spasm     Past Surgical History:  Procedure Laterality Date  . ANTERIOR CERVICAL DECOMP/DISCECTOMY FUSION N/A 03/08/2018   Procedure: ANTERIOR CERVICAL DECOMPRESSION/DISCECTOMY FUSION , cervical 3-4, cervical 4-5, cervical 5-6 with intrumentational and allograft;  Surgeon: Phylliss Bob, MD;  Location: [name];  Service: Orthopedics;  Laterality: N/A;  . BONE BIOPSY Right 05/17/2018   Procedure: BONE BIOPSY X2;  Surgeon: Evelina Bucy, DPM;  Location: [name];  Service: Podiatry;  Laterality: Right;  right second toe  . COLONOSCOPY    . FOOT SURGERY     right, ~ 2002  . POLYPECTOMY      There were no vitals filed for this visit.   Subjective Assessment - 09/03/20 1639    Subjective Patient reports he is doing ok today without any pain. He report soreness along the side of the Rt hip from doing his HEP, though otherwise is feeling  good.    Currently in Pain? No/denies              [name] PT Assessment - 09/03/20 0001      AROM   Left Hip Flexion 105    Left Hip ABduction 10    Left Knee Flexion 113      Strength   Right Hip Flexion 5/5    Right Hip Extension 4+/5    Right Hip ABduction 4+/5    Left Hip Flexion 5/5    Left Hip Extension 4-/5    Left Hip ABduction 4-/5                         [facility] Adult PT Treatment/Exercise - 09/03/20 0001      Self-Care   Other Self-Care Comments  see patient education      Knee/Hip Exercises: Aerobic   Stationary Bike level 5, 5 minutes      Knee/Hip Exercises: Seated   Sit to Sand 2 sets;10 reps      Knee/Hip Exercises: Prone   Hip Extension 2 sets;10 reps      Manual Therapy   Manual therapy comments Hip PROM all planes  PT Education - 09/03/20 1709    Education Details education on overall functional progress, FOTO score.    Person(s) Educated Patient    Methods Explanation    Comprehension Verbalized understanding            PT Short Term Goals - 09/03/20 1640      PT SHORT TERM GOAL #1   Title Pt will be independent with HEP as it has been established in the short term    Time 3    Period Weeks    Status Achieved    Target Date 08/05/20      PT SHORT TERM GOAL #2   Title pt will demo toe off and knee flexion in gait    Baseline toe off present, though lacks knee flexion (likely due to limited hip extension)    Time 3    Period Weeks    Status Partially Met    Target Date 08/05/20             PT Long Term Goals - 09/03/20 1641      PT LONG TERM GOAL #1   Title Pt will demo at least 10 deg of hip abd    Baseline 10    Time 10    Period Weeks    Status Achieved      PT LONG TERM GOAL #2   Title pt will be able to return to walking program & strengthening program for exercise, safely    [facility]    Time 10    Period Weeks    Status  On-going      PT LONG TERM GOAL #3   Title gross LE strength to 5/5    Baseline see flowsheet    Time 10    Period Weeks    Status Partially Met      PT LONG TERM GOAL #4   Title pt will demo ability to perform both static balance holds and dynamic walking with head turns with control of balance    Baseline no LOB with static balance with head turns, patient reports feeling unsteady during gait with head turns.    Time 10    Period Weeks    Status On-going                 Plan - 09/03/20 1653    Clinical Impression Statement Patient is progressing well towards established short term and long term functional goals reporting an overall reduction in Lt hip/back pain since start of care. His hip ROM and strength have much improved with moderate weakness remaining in Lt hip and lingering restrictions into hip extension and abduction. His gait mechanics are improving with ability to complete heel strike and toe off bilaterally, though still lacks knee flexion during stance/swing phase on the LLE, which could be partly due to lack of patient's hip extension ROM. His  postural awareness is improving as he is able to assume and maintain neutral hip positioning in sitting  He will benefit from continued care to further progress his strength and improve his overall gait mechanics and balance in order to return to optimal function and improve safety with daily activity.    Personal Factors and Comorbidities Time since onset of injury/illness/exacerbation;Comorbidity 1    Comorbidities past hip fx    Examination-Activity Limitations Squat;Stairs;Lift;Bend;Stand;Locomotion Level;Transfers;Sit;Sleep    Examination-Participation Restrictions Other;Community Activity   exercise   Stability/Clinical Decision Making Stable/Uncomplicated  Rehab Potential Good    PT Frequency 2x / week    PT Duration Other (comment)   10 weeks   PT Treatment/Interventions ADLs/Self Care Home Management;Aquatic  Therapy;Cryotherapy;Ultrasound;Traction;Moist Heat;Iontophoresis 37m/ml Dexamethasone;Electrical Stimulation;Gait training;Stair training;Functional mobility training;Neuromuscular re-education;Balance training;Therapeutic exercise;Therapeutic activities;Patient/family education;Manual techniques;Passive range of motion;Dry needling;Taping;Spinal Manipulations;Joint Manipulations    PT Next Visit Plan progress note, gait training with mirror, progress hip strengthening, manual therapy to improve Lt hip ROM    PT Home Exercise Plan [id number]    Consulted and Agree with Plan of Care Patient           Patient will benefit from skilled therapeutic intervention in order to improve the following deficits and impairments:  Abnormal gait,Decreased balance,Decreased endurance,Difficulty walking,Hypomobility,Increased muscle spasms,Impaired sensation,Improper body mechanics,Decreased range of motion,Decreased activity tolerance,Decreased strength,Impaired flexibility,Postural dysfunction,Pain  Visit Diagnosis: Chronic left-sided low back pain without sciatica  Stiffness of left hip, not elsewhere classified  Difficulty in walking, not elsewhere classified  Muscle weakness (generalized)     Problem List Patient Active Problem List   Diagnosis Date Noted  . [place] 08/07/2019  . [place] 08/07/2019  . Diarrhea 08/07/2019  . Anemia 08/07/2019  . [place]ical myelopathy ([place] 08/15/2018  . Foot laceration, right, initial encounter   . Radiculopathy 03/08/2018  . Tubular adenoma 05/16/2017  . Depressed mood 05/16/2017  . [place]stance [place] 05/16/2017  . Hallux valgus 08/03/2016  . Disc disorder   . Lumbar facet arthropathy 08/06/2014  . Lumbar radiculopathy 08/06/2014  . ED (erectile dysfunction) 01/07/2014  . Unspecified vitamin D deficiency 01/07/2014  . Left shoulder pain 08/10/2013  . Chronic low back pain 08/10/2013  . Essential hypertension 08/10/2013  .  Tobacco abuse 08/10/2013   SGwendolyn Grant PT, DPT, ATC 09/03/20 6:27 PM  [facility] [address]Phone: [phone]  Fax:  [phone] Name: Calvin GagoMRN: [id number] of Birth: 101/07/1961

## 2020-09-08 ENCOUNTER — Ambulatory Visit: Payer: Medicare Other

## 2020-09-08 ENCOUNTER — Other Ambulatory Visit: Payer: Self-pay

## 2020-09-08 DIAGNOSIS — M6281 Muscle weakness (generalized): Secondary | ICD-10-CM

## 2020-09-08 DIAGNOSIS — G8929 Other chronic pain: Secondary | ICD-10-CM | POA: Diagnosis not present

## 2020-09-08 DIAGNOSIS — M545 Low back pain, unspecified: Secondary | ICD-10-CM | POA: Diagnosis not present

## 2020-09-08 DIAGNOSIS — R262 Difficulty in walking, not elsewhere classified: Secondary | ICD-10-CM | POA: Diagnosis not present

## 2020-09-08 DIAGNOSIS — M25652 Stiffness of left hip, not elsewhere classified: Secondary | ICD-10-CM

## 2020-09-08 NOTE — Therapy (Signed)
[place], [place], [address] Phone: [phone]   Fax:  [phone]  Physical Therapy Treatment  Patient Details  Name: Calvin [name] MRN: [id number] Date of Birth: 1961/07/10 Referring Provider (PT): Phylliss Bob, MD   Encounter Date: 09/08/2020   PT End of Session - 09/08/20 1611    Visit Number 11    Number of Visits 21    Date for PT Re-Evaluation 09/27/20    Authorization Type UHC MCR/MCD    Progress Note Due on Visit 20    PT Start Time 1548    PT Stop Time 1630    PT Time Calculation (min) 42 min    Activity Tolerance Patient tolerated treatment well    Behavior During Therapy [facility] for tasks assessed/performed           Past Medical History:  Diagnosis Date   Anxiety    Arthritis    back, shoulders    Constipation    Depression    Disc disorder    Hypertension    Lumbar herniated disc    Muscle spasm     Past Surgical History:  Procedure Laterality Date   ANTERIOR CERVICAL DECOMP/DISCECTOMY FUSION N/A 03/08/2018   Procedure: ANTERIOR CERVICAL DECOMPRESSION/DISCECTOMY FUSION , cervical 3-4, cervical 4-5, cervical 5-6 with intrumentational and allograft;  Surgeon: Phylliss Bob, MD;  Location: [place];  Service: Orthopedics;  Laterality: N/A;   BONE BIOPSY Right 05/17/2018   Procedure: BONE BIOPSY X2;  Surgeon: Evelina Bucy, DPM;  Location: [place];  Service: Podiatry;  Laterality: Right;  right second toe   COLONOSCOPY     FOOT SURGERY     right, ~ 2002   POLYPECTOMY      There were no vitals filed for this visit.   Subjective Assessment - 09/08/20 1612    Subjective Patient reports some pain and stiffness along the Lt hip.    Currently in Pain? Yes    Pain Score 4     Pain Location Hip    Pain Orientation Left    Pain Descriptors / Indicators Dull    Pain Type Chronic pain    Pain Onset More than a month ago    Pain Frequency Intermittent                              [facility] Adult PT Treatment/Exercise - 09/08/20 0001      Lumbar Exercises: Stretches   Sports administrator Limitations prone 90 sec each      Knee/Hip Exercises: Aerobic   Stationary Bike level 5, 5 minutes      Knee/Hip Exercises: Standing   Lateral Step Up Limitations lateral step overs 2 x 10 small cone    Forward Step Up Limitations forward and backward step overs 2 x 10 with LLE small cone    Other Standing Knee Exercises 3 trials each of tandem and SLS      Manual Therapy   Manual therapy comments Hip PROM all planes, hip joint mobilization inferior, A/P grade II-III                    PT Short Term Goals - 09/03/20 1640      PT SHORT TERM GOAL #1   Title Pt will be independent with HEP as it has been established in the short term    Time 3    Period Weeks  Status Achieved    Target Date 08/05/20      PT SHORT TERM GOAL #2   Title pt will demo toe off and knee flexion in gait    Baseline toe off present, though lacks knee flexion (likely due to limited hip extension)    Time 3    Period Weeks    Status Partially Met    Target Date 08/05/20             PT Long Term Goals - 09/03/20 1641      PT LONG TERM GOAL #1   Title Pt will demo at least 10 deg of hip abd    Baseline 10    Time 10    Period Weeks    Status Achieved      PT LONG TERM GOAL #2   Title pt will be able to return to walking program & strengthening program for exercise, safely    Baseline [facility]    Time 10    Period Weeks    Status On-going      PT LONG TERM GOAL #3   Title gross LE strength to 5/5    Baseline see flowsheet    Time 10    Period Weeks    Status Partially Met      PT LONG TERM GOAL #4   Title pt will demo ability to perform both static balance holds and dynamic walking with head turns with control of balance    Baseline no LOB with static balance with head turns, patient reports  feeling unsteady during gait with head turns.    Time 10    Period Weeks    Status On-going                 Plan - 09/08/20 1623    Clinical Impression Statement Patient tolerated session well today without increased reports of hip pain. Patient requires cues for proper performance of forward/backward step overs as he has tendency to compensate with trunk flexion when performing backward step on the left, likely due to limited hip extension ROM, though is able to correct trunk position once cued. Patient has tendency to maintain Rt knee in flexed position when standing, likely due to long standing habit that is a result of LLD. With cueing throughout standing activity patient able to maintain Rt knee in neutral position. Patient demonstrates fair postural stability with single leg and narrow base static balance activity with ability to maintain balance for short duration before requiring UE support to keep from loosing balance.    Personal Factors and Comorbidities Time since onset of injury/illness/exacerbation;Comorbidity 1    Comorbidities past hip fx    Examination-Activity Limitations Squat;Stairs;Lift;Bend;Stand;Locomotion Level;Transfers;Sit;Sleep    Examination-Participation Restrictions Other;Community Activity   exercise   Stability/Clinical Decision Making Stable/Uncomplicated    Rehab Potential Good    PT Frequency 2x / week    PT Duration Other (comment)   10 weeks   PT Treatment/Interventions ADLs/Self Care Home Management;Aquatic Therapy;Cryotherapy;Ultrasound;Traction;Moist Heat;Iontophoresis 73m/ml Dexamethasone;Electrical Stimulation;Gait training;Stair training;Functional mobility training;Neuromuscular re-education;Balance training;Therapeutic exercise;Therapeutic activities;Patient/family education;Manual techniques;Passive range of motion;Dry needling;Taping;Spinal Manipulations;Joint Manipulations    PT Next Visit Plan , progress hip strengthening, manual therapy to  improve Lt hip ROM    PT Home Exercise Plan [id number]    Consulted and Agree with Plan of Care Patient           Patient will benefit from skilled therapeutic intervention in order  to improve the following deficits and impairments:  Abnormal gait,Decreased balance,Decreased endurance,Difficulty walking,Hypomobility,Increased muscle spasms,Impaired sensation,Improper body mechanics,Decreased range of motion,Decreased activity tolerance,Decreased strength,Impaired flexibility,Postural dysfunction,Pain  Visit Diagnosis: Chronic left-sided low back pain without sciatica  Stiffness of left hip, not elsewhere classified  Difficulty in walking, not elsewhere classified  Muscle weakness (generalized)     Problem List Patient Active Problem List   Diagnosis Date Noted   Severe sepsis ([place]) 08/07/2019   Lobar pneumonia ([place]) 08/07/2019   Diarrhea 08/07/2019   Anemia 08/07/2019   Cervical myelopathy ([place]) 08/15/2018   Foot laceration, right, initial encounter    Radiculopathy 03/08/2018   Tubular adenoma 05/16/2017   Depressed mood 05/16/2017   Substance abuse ([place]) 05/16/2017   Hallux valgus 08/03/2016   Disc disorder    Lumbar facet arthropathy 08/06/2014   Lumbar radiculopathy 08/06/2014   ED (erectile dysfunction) 01/07/2014   Unspecified vitamin D deficiency 01/07/2014   Left shoulder pain 08/10/2013   Chronic low back pain 08/10/2013   Essential hypertension 08/10/2013   Tobacco abuse 08/10/2013   Gwendolyn Grant, PT, DPT, ATC 09/08/20 6:08 PM  [facility] [address] [place], [place], [address] Phone: [phone]   Fax:  [phone]  Name: [place] [name] MRN: [id number] Date of Birth: [birth year]

## 2020-09-10 ENCOUNTER — Other Ambulatory Visit: Payer: Self-pay

## 2020-09-10 ENCOUNTER — Ambulatory Visit: Payer: Medicare Other

## 2020-09-10 ENCOUNTER — Telehealth: Payer: Self-pay | Admitting: Internal Medicine

## 2020-09-10 ENCOUNTER — Other Ambulatory Visit: Payer: Self-pay | Admitting: Internal Medicine

## 2020-09-10 DIAGNOSIS — M545 Low back pain, unspecified: Secondary | ICD-10-CM

## 2020-09-10 DIAGNOSIS — G8929 Other chronic pain: Secondary | ICD-10-CM | POA: Diagnosis not present

## 2020-09-10 DIAGNOSIS — M25652 Stiffness of left hip, not elsewhere classified: Secondary | ICD-10-CM | POA: Diagnosis not present

## 2020-09-10 DIAGNOSIS — M6281 Muscle weakness (generalized): Secondary | ICD-10-CM

## 2020-09-10 DIAGNOSIS — R262 Difficulty in walking, not elsewhere classified: Secondary | ICD-10-CM | POA: Diagnosis not present

## 2020-09-10 DIAGNOSIS — M5416 Radiculopathy, lumbar region: Secondary | ICD-10-CM

## 2020-09-10 DIAGNOSIS — R252 Cramp and spasm: Secondary | ICD-10-CM

## 2020-09-10 MED FILL — SERTRALINE HCL 50 MG TABLET: 50 | 30 days supply | Qty: 30 | Fill #2

## 2020-09-10 NOTE — Therapy (Signed)
[place], [place], [address] Phone: [phone]   Fax:  [phone]  Physical Therapy Treatment  Patient Details  Name: Calvin [name] MRN: [id number] Date of Birth: 24-Sep-1960 Referring Provider (PT): Phylliss Bob, MD   Encounter Date: 09/10/2020   PT End of Session - 09/10/20 1549    Visit Number 12    Number of Visits 21    Date for PT Re-Evaluation 09/27/20    Authorization Type UHC MCR/MCD    Progress Note Due on Visit 20    PT Start Time 1545    PT Stop Time 1626    PT Time Calculation (min) 41 min    Activity Tolerance Patient tolerated treatment well    Behavior During Therapy [facility] for tasks assessed/performed           Past Medical History:  Diagnosis Date  . Anxiety   . Arthritis    back, shoulders   . Constipation   . Depression   . Disc disorder   . Hypertension   . Lumbar herniated disc   . Muscle spasm     Past Surgical History:  Procedure Laterality Date  . ANTERIOR CERVICAL DECOMP/DISCECTOMY FUSION N/A 03/08/2018   Procedure: ANTERIOR CERVICAL DECOMPRESSION/DISCECTOMY FUSION , cervical 3-4, cervical 4-5, cervical 5-6 with intrumentational and allograft;  Surgeon: Phylliss Bob, MD;  Location: [place];  Service: Orthopedics;  Laterality: N/A;  . BONE BIOPSY Right 05/17/2018   Procedure: BONE BIOPSY X2;  Surgeon: Evelina Bucy, DPM;  Location: [place];  Service: Podiatry;  Laterality: Right;  right second toe  . COLONOSCOPY    . FOOT SURGERY     right, ~ 2002  . POLYPECTOMY      There were no vitals filed for this visit.   Subjective Assessment - 09/10/20 1548    Subjective Patient reports he is doing ok today "just some stiffness."    Currently in Pain? No/denies    Pain Onset More than a month ago                             [facility] Adult PT Treatment/Exercise - 09/10/20 0001      Self-Care   Other Self-Care Comments  see patient  education      Knee/Hip Exercises: Aerobic   Stationary Bike level 5, 5 minutes      Knee/Hip Exercises: Machines for Strengthening   Total Gym Leg Press 2 x 10; 40 lbs    Hip Cybex extension 2 x 15 12.5 lbs LLE      Knee/Hip Exercises: Standing   Other Standing Knee Exercises 2 trials of tandem balance 10-30 sec      Knee/Hip Exercises: Sidelying   Hip ABduction Limitations 2 x 15 partial range LLE      Manual Therapy   Manual therapy comments Hip PROM all planes, hip joint mobilization inferior, A/P grade II-III                  PT Education - 09/10/20 1627    Education Details review of HEP    Person(s) Educated Patient    Methods Explanation;Handout    Comprehension Verbalized understanding            PT Short Term Goals - 09/03/20 1640      PT SHORT TERM GOAL #1   Title Pt will be independent with HEP as it has been established  in the short term    Time 3    Period Weeks    Status Achieved    Target Date 08/05/20      PT SHORT TERM GOAL #2   Title pt will demo toe off and knee flexion in gait    Baseline toe off present, though lacks knee flexion (likely due to limited hip extension)    Time 3    Period Weeks    Status Partially Met    Target Date 08/05/20             PT Long Term Goals - 09/03/20 1641      PT LONG TERM GOAL #1   Title Pt will demo at least 10 deg of hip abd    Baseline 10    Time 10    Period Weeks    Status Achieved      PT LONG TERM GOAL #2   Title pt will be able to return to walking program & strengthening program for exercise, safely    [facility]    Time 10    Period Weeks    Status On-going      PT LONG TERM GOAL #3   Title gross LE strength to 5/5    Baseline see flowsheet    Time 10    Period Weeks    Status Partially Met      PT LONG TERM GOAL #4   Title pt will demo ability to perform both static balance holds and dynamic walking with head turns with  control of balance    Baseline no LOB with static balance with head turns, patient reports feeling unsteady during gait with head turns.    Time 10    Period Weeks    Status On-going                 Plan - 09/10/20 1550    Clinical Impression Statement Patient tolerated session well today without reports of pain. He demonstrates improved alignment with squats on leg press with ability to maintain knee in neutral alignment throughout ascent/descent. Able to complete resisted hip extension on cybex machine with patient initially demonstrating excessive trunk flexion, though with continued practice patient able to complete short range of resisted hip extension without trunk compensation.    Personal Factors and Comorbidities Time since onset of injury/illness/exacerbation;Comorbidity 1    Comorbidities past hip fx    Examination-Activity Limitations Squat;Stairs;Lift;Bend;Stand;Locomotion Level;Transfers;Sit;Sleep    Examination-Participation Restrictions Other;Community Activity   exercise   Stability/Clinical Decision Making Stable/Uncomplicated    Rehab Potential Good    PT Frequency 2x / week    PT Duration Other (comment)   10 weeks   PT Treatment/Interventions ADLs/Self Care Home Management;Aquatic Therapy;Cryotherapy;Ultrasound;Traction;Moist Heat;Iontophoresis 31m/ml Dexamethasone;Electrical Stimulation;Gait training;Stair training;Functional mobility training;Neuromuscular re-education;Balance training;Therapeutic exercise;Therapeutic activities;Patient/family education;Manual techniques;Passive range of motion;Dry needling;Taping;Spinal Manipulations;Joint Manipulations    PT Next Visit Plan , progress hip strengthening, manual therapy to improve Lt hip ROM    PT Home Exercise Plan [id number]    Consulted and Agree with Plan of Care Patient           Patient will benefit from skilled therapeutic intervention in order to improve the following deficits and impairments:  Abnormal  gait,Decreased balance,Decreased endurance,Difficulty walking,Hypomobility,Increased muscle spasms,Impaired sensation,Improper body mechanics,Decreased range of motion,Decreased activity tolerance,Decreased strength,Impaired flexibility,Postural dysfunction,Pain  Visit Diagnosis: Chronic left-sided low back pain without sciatica  Stiffness of left hip, not elsewhere classified  Difficulty in walking, not elsewhere classified  Muscle weakness (generalized)     Problem List Patient Active Problem List   Diagnosis Date Noted  . Severe sepsis ([place]) 08/07/2019  . Lobar pneumonia ([place]) 08/07/2019  . Diarrhea 08/07/2019  . Anemia 08/07/2019  . Cervical myelopathy ([place]) 08/15/2018  . Foot laceration, right, initial encounter   . Radiculopathy 03/08/2018  . Tubular adenoma 05/16/2017  . Depressed mood 05/16/2017  . Substance abuse ([place]) 05/16/2017  . Hallux valgus 08/03/2016  . Disc disorder   . Lumbar facet arthropathy 08/06/2014  . Lumbar radiculopathy 08/06/2014  . ED (erectile dysfunction) 01/07/2014  . Unspecified vitamin D deficiency 01/07/2014  . Left shoulder pain 08/10/2013  . Chronic low back pain 08/10/2013  . Essential hypertension 08/10/2013  . Tobacco abuse 08/10/2013   Gwendolyn Grant, PT, DPT, ATC 09/10/20 4:31 PM [facility] [address] [place], [place], [address] Phone: [phone]   Fax:  [phone]  Name: Connar Keating MRN: [id number] Date of Birth: June 29, 1961

## 2020-09-10 NOTE — Telephone Encounter (Signed)
Copied from [place] [phone] [id number]. Topic: Quick Communication - Rx Refill/Question >> Sep 10, 2020  2:40 PM Leward Quan A wrote: Medication: cyclobenzaprine (FLEXERIL) 5 MG tablet   Has the patient contacted their pharmacy? Yes.   (Agent: If no, request that the patient contact the pharmacy for the refill.) (Agent: If yes, when and what did the pharmacy advise?)  Preferred Pharmacy (with phone number or street name): [place], [place] [address]  Phone:  [phone] Fax:  [phone]     Agent: Please be advised that RX refills may take up to 3 business days. We ask that you follow-up with your pharmacy.

## 2020-09-10 NOTE — Telephone Encounter (Signed)
Requested medication (s) are due for refill today: yes  Requested medication (s) are on the active medication list:  yes   Last refill: 05/23/2020  Future visit scheduled: no  Notes to clinic:  this refill cannot be delegated    Requested Prescriptions  Pending Prescriptions Disp Refills   cyclobenzaprine (FLEXERIL) 5 MG tablet 30 tablet 1    Sig: Take 1 tablet (5 mg total) by mouth 2 (two) times daily as needed for muscle spasms.      There is no refill protocol information for this order

## 2020-09-15 ENCOUNTER — Ambulatory Visit: Payer: Medicare Other

## 2020-09-15 ENCOUNTER — Telehealth: Payer: Self-pay

## 2020-09-15 ENCOUNTER — Other Ambulatory Visit: Payer: Self-pay | Admitting: Pharmacy Technician

## 2020-09-15 ENCOUNTER — Other Ambulatory Visit: Payer: Self-pay | Admitting: Internal Medicine

## 2020-09-15 DIAGNOSIS — M5416 Radiculopathy, lumbar region: Secondary | ICD-10-CM

## 2020-09-15 DIAGNOSIS — R252 Cramp and spasm: Secondary | ICD-10-CM

## 2020-09-15 NOTE — Telephone Encounter (Signed)
Requested medication (s) are due for refill today:   Provider to review  Requested medication (s) are on the active medication list:   Yes  Future visit scheduled:   No   Last ordered: 05/23/2020 #30, 1 refill  Clinic note:  Not sure why this keeps being resubmitted by pharmacy.   Returning it since it is a non delegated refill   Requested Prescriptions  Pending Prescriptions Disp Refills   cyclobenzaprine (FLEXERIL) 5 MG tablet [Pharmacy Med Name: CYCLOBENZAPRINE 5 MG TABLET 5 Tablet] 30 tablet 1    Sig: TAKE 1 TABLET (5 MG TOTAL) BY MOUTH 2 (TWO) TIMES DAILY AS NEEDED FOR MUSCLE SPASMS.      Not Delegated - Analgesics:  Muscle Relaxants Failed - 09/15/2020 11:28 AM      Failed - This refill cannot be delegated      Passed - Valid encounter within last 6 months    Recent Outpatient Visits           5 months ago Essential hypertension   [place], [place]   1 year ago Leukocytosis, unspecified type   [place], [place]   1 year ago Closed fracture of left hip, initial encounter [facility])   [facility] Ladell Pier, MD   2 years ago Essential hypertension   [place] Ladell Pier, MD   2 years ago Pre-op examination   [place] Ladell Pier, MD

## 2020-09-15 NOTE — Telephone Encounter (Signed)
Attempted to notifying patient of no-show appointment. Unable to leave voicemail as his inbox is full.

## 2020-09-16 ENCOUNTER — Telehealth: Payer: Self-pay | Admitting: Internal Medicine

## 2020-09-16 ENCOUNTER — Other Ambulatory Visit: Payer: Self-pay | Admitting: Internal Medicine

## 2020-09-16 DIAGNOSIS — M5416 Radiculopathy, lumbar region: Secondary | ICD-10-CM

## 2020-09-16 DIAGNOSIS — R252 Cramp and spasm: Secondary | ICD-10-CM

## 2020-09-16 MED ORDER — CYCLOBENZAPRINE HCL 5 MG PO TABS: 5.0000 mg | ORAL_TABLET | Freq: Two times a day (BID) | ORAL | 1 refills | Status: DC | PRN

## 2020-09-16 NOTE — Telephone Encounter (Signed)
Pt has scheduled the first available with Dr Wynetta Emery, which is 11/06/20. However, pt states he is dealing with a hip surgery recovery and needs the  cyclobenzaprine (FLEXERIL) 5 MG tablet  B/c he is having physical therapy and really needs.  Hoping Dr Wynetta Emery will refill for him.  [place], [place] [address]

## 2020-09-16 NOTE — Telephone Encounter (Signed)
Called pt left message RX sent to pharmacy

## 2020-09-17 ENCOUNTER — Other Ambulatory Visit: Payer: Self-pay

## 2020-09-17 ENCOUNTER — Ambulatory Visit: Payer: Medicare Other

## 2020-09-17 DIAGNOSIS — R262 Difficulty in walking, not elsewhere classified: Secondary | ICD-10-CM

## 2020-09-17 DIAGNOSIS — M25652 Stiffness of left hip, not elsewhere classified: Secondary | ICD-10-CM

## 2020-09-17 DIAGNOSIS — G8929 Other chronic pain: Secondary | ICD-10-CM | POA: Diagnosis not present

## 2020-09-17 DIAGNOSIS — M6281 Muscle weakness (generalized): Secondary | ICD-10-CM | POA: Diagnosis not present

## 2020-09-17 DIAGNOSIS — M545 Low back pain, unspecified: Secondary | ICD-10-CM | POA: Diagnosis not present

## 2020-09-17 MED FILL — CYCLOBENZAPRINE 5 MG TABLET: 5 | 15 days supply | Qty: 30 | Fill #0

## 2020-09-17 NOTE — Therapy (Addendum)
[place], [place], [address] Phone: [phone]   Fax:  [phone]  Physical Therapy Treatment/Discharge  Patient Details  Name: Calvin [name] MRN: [id number] Date of Birth: Dec 11, 1960 Referring Provider (PT): Phylliss Bob, MD   Encounter Date: 09/17/2020   PT End of Session - 09/17/20 1551    Visit Number 13    Number of Visits 21    Date for PT Re-Evaluation 09/27/20    Authorization Type UHC MCR/MCD    Progress Note Due on Visit 20    PT Start Time 1547    PT Stop Time 1629    PT Time Calculation (min) 42 min    Activity Tolerance Patient tolerated treatment well    Behavior During Therapy [facility] for tasks assessed/performed           Past Medical History:  Diagnosis Date  . Anxiety   . Arthritis    back, shoulders   . Constipation   . Depression   . Disc disorder   . Hypertension   . Lumbar herniated disc   . Muscle spasm     Past Surgical History:  Procedure Laterality Date  . ANTERIOR CERVICAL DECOMP/DISCECTOMY FUSION N/A 03/08/2018   Procedure: ANTERIOR CERVICAL DECOMPRESSION/DISCECTOMY FUSION , cervical 3-4, cervical 4-5, cervical 5-6 with intrumentational and allograft;  Surgeon: Phylliss Bob, MD;  Location: [place];  Service: Orthopedics;  Laterality: N/A;  . BONE BIOPSY Right 05/17/2018   Procedure: BONE BIOPSY X2;  Surgeon: Evelina Bucy, DPM;  Location: [place];  Service: Podiatry;  Laterality: Right;  right second toe  . COLONOSCOPY    . FOOT SURGERY     right, ~ 2002  . POLYPECTOMY      There were no vitals filed for this visit.   Subjective Assessment - 09/17/20 1550    Subjective "Feeling pretty good" Patient started attending the [facility] yesterday and rode the bike.    Currently in Pain? No/denies    Pain Onset More than a month ago                             [facility] Adult PT Treatment/Exercise - 09/17/20 0001      Knee/Hip  Exercises: Aerobic   Stationary Bike level 5, 5 minutes      Knee/Hip Exercises: Machines for Strengthening   Total Gym Leg Press 2 x 10; 40 lbs    Hip Cybex extension 2 x 15 12.5 lbs LLE; abduction 2  x10 12.5lbs LLE      Knee/Hip Exercises: Standing   Forward Step Up 2 sets;10 reps    Forward Step Up Limitations 8 inch; single UE supprt    Other Standing Knee Exercises step taps on 8 inch 1 x 20    Other Standing Knee Exercises forward and backward step overs hurdle 2 x 10 bilateral                  PT Education - 09/17/20 1619    Education Details Education on trial/learning about gym equipment at [employer] prior to next session.    Person(s) Educated Patient    Methods Explanation    Comprehension Verbalized understanding            PT Short Term Goals - 09/03/20 1640      PT SHORT TERM GOAL #1   Title Pt will be independent with HEP as it has been established  in the short term    Time 3    Period Weeks    Status Achieved    Target Date 08/05/20      PT SHORT TERM GOAL #2   Title pt will demo toe off and knee flexion in gait    Baseline toe off present, though lacks knee flexion (likely due to limited hip extension)    Time 3    Period Weeks    Status Partially Met    Target Date 08/05/20             PT Long Term Goals - 09/03/20 1641      PT LONG TERM GOAL #1   Title Pt will demo at least 10 deg of hip abd    Baseline 10    Time 10    Period Weeks    Status Achieved      PT LONG TERM GOAL #2   Title pt will be able to return to walking program & strengthening program for exercise, safely    Baseline [facility]    Time 10    Period Weeks    Status On-going      PT LONG TERM GOAL #3   Title gross LE strength to 5/5    Baseline see flowsheet    Time 10    Period Weeks    Status Partially Met      PT LONG TERM GOAL #4   Title pt will demo ability to perform both static balance holds and dynamic walking with  head turns with control of balance    Baseline no LOB with static balance with head turns, patient reports feeling unsteady during gait with head turns.    Time 10    Period Weeks    Status On-going                 Plan - 09/17/20 1552    Clinical Impression Statement Patient demonstrating improved hip mobility with step overs from increased height requiring minimal cues to decrease circumduction swing on LLE during forward step over and to maintain upright trunk during backward step on the LLE. Patient challenged with step ups with RLE leading as he has tendency to hip hike on the Lt to reach the step with moderate ability to correct with verbal and visual cues. Overall good tolerance to today's session without reports of pain. Patient was encouraged to visit the [employer] again before next session to trial/learn about available equipment in order to discuss at next session if necessary.    Personal Factors and Comorbidities Time since onset of injury/illness/exacerbation;Comorbidity 1    Comorbidities past hip fx    Examination-Activity Limitations Squat;Stairs;Lift;Bend;Stand;Locomotion Level;Transfers;Sit;Sleep    Examination-Participation Restrictions Other;Community Activity   exercise   Stability/Clinical Decision Making Stable/Uncomplicated    Rehab Potential Good    PT Frequency 2x / week    PT Duration Other (comment)   10 weeks   PT Treatment/Interventions ADLs/Self Care Home Management;Aquatic Therapy;Cryotherapy;Ultrasound;Traction;Moist Heat;Iontophoresis 81m/ml Dexamethasone;Electrical Stimulation;Gait training;Stair training;Functional mobility training;Neuromuscular re-education;Balance training;Therapeutic exercise;Therapeutic activities;Patient/family education;Manual techniques;Passive range of motion;Dry needling;Taping;Spinal Manipulations;Joint Manipulations    PT Next Visit Plan , progress hip strengthening, manual therapy to improve Lt hip ROM    PT Home Exercise Plan  [id number]    Consulted and Agree with Plan of Care Patient           Patient will benefit from skilled therapeutic intervention in order to improve the  following deficits and impairments:  Abnormal gait,Decreased balance,Decreased endurance,Difficulty walking,Hypomobility,Increased muscle spasms,Impaired sensation,Improper body mechanics,Decreased range of motion,Decreased activity tolerance,Decreased strength,Impaired flexibility,Postural dysfunction,Pain  Visit Diagnosis: Chronic left-sided low back pain without sciatica  Stiffness of left hip, not elsewhere classified  Difficulty in walking, not elsewhere classified  Muscle weakness (generalized)     Problem List Patient Active Problem List   Diagnosis Date Noted  . Severe sepsis ([place]) 08/07/2019  . Lobar pneumonia ([place]) 08/07/2019  . Diarrhea 08/07/2019  . Anemia 08/07/2019  . Cervical myelopathy ([place]) 08/15/2018  . Foot laceration, right, initial encounter   . Radiculopathy 03/08/2018  . Tubular adenoma 05/16/2017  . Depressed mood 05/16/2017  . Substance abuse ([place]) 05/16/2017  . Hallux valgus 08/03/2016  . Disc disorder   . Lumbar facet arthropathy 08/06/2014  . Lumbar radiculopathy 08/06/2014  . ED (erectile dysfunction) 01/07/2014  . Unspecified vitamin D deficiency 01/07/2014  . Left shoulder pain 08/10/2013  . Chronic low back pain 08/10/2013  . Essential hypertension 08/10/2013  . Tobacco abuse 08/10/2013   Gwendolyn Grant, PT, DPT, ATC 09/17/20 4:31 PM PHYSICAL THERAPY DISCHARGE SUMMARY  Visits from Start of Care: 13  Current functional level related to goals / functional outcomes: See functional goals above.     Remaining deficits: See functional goals above.    Education / Equipment: See education above.   Plan: Patient agrees to discharge.  Patient goals were partially met. Patient is being discharged due to not returning since the last visit.  ?????         Gwendolyn Grant, PT, DPT,  ATC 10/15/20 4:55 PM  [facility] [address] [place], [place], [address] Phone: [phone]   Fax:  [phone]  Name: Caswell Alvillar MRN: [id number] Date of Birth: 19-Nov-1960

## 2020-09-22 ENCOUNTER — Ambulatory Visit: Payer: Medicare Other

## 2020-09-24 ENCOUNTER — Ambulatory Visit: Payer: Medicare Other

## 2020-09-24 ENCOUNTER — Telehealth: Payer: Self-pay

## 2020-09-24 NOTE — Telephone Encounter (Signed)
PT called regarding patient's no show this afternoon, which would have been a re-evaluation. Due to having one previous phone call (regarding 2nd no show), patient was reminded of attendance policy and informed that he may schedule 1 future appointment. He expresses interest in returning for another visit and states he will schedule an appointment.  Haydee [name], PT, DPT 09/24/20 4:05 PM

## 2020-09-30 ENCOUNTER — Ambulatory Visit: Payer: Medicare Other

## 2020-10-01 ENCOUNTER — Telehealth: Payer: Self-pay | Admitting: Physical Therapy

## 2020-10-01 ENCOUNTER — Ambulatory Visit: Payer: Medicare Other | Attending: Orthopedic Surgery | Admitting: Physical Therapy

## 2020-10-01 NOTE — Telephone Encounter (Signed)
Attempted to call patient regarding no show for therapy appointment. No answer and unable to leave voicemail.

## 2020-10-08 ENCOUNTER — Other Ambulatory Visit: Payer: Self-pay | Admitting: Internal Medicine

## 2020-10-08 DIAGNOSIS — F330 Major depressive disorder, recurrent, mild: Secondary | ICD-10-CM

## 2020-10-08 NOTE — Telephone Encounter (Signed)
   Notes to clinic: Patient has appt on 11/06/2020  Review for refill until appt  Protocol failed for 6 month follow up   Requested Prescriptions  Pending Prescriptions Disp Refills   sertraline (ZOLOFT) 50 MG tablet [Pharmacy Med Name: SERTRALINE HCL 50 MG TABLET 50 Tablet] 30 tablet 2    Sig: TAKE 1 TABLET BY MOUTH DAILY.      Psychiatry:  Antidepressants - SSRI Failed - 10/08/2020 12:58 PM      Failed - Valid encounter within last 6 months    Recent Outpatient Visits           6 months ago Essential hypertension   [place], MD   1 year ago Leukocytosis, unspecified type   [place], [place]   1 year ago Closed fracture of left hip, initial encounter [facility])   [place], MD   2 years ago Essential hypertension   [place], MD   2 years ago Pre-op examination   [place], MD       Future Appointments             In 4 weeks Ladell Pier, MD [place]

## 2020-10-15 ENCOUNTER — Ambulatory Visit: Payer: Medicare Other

## 2020-11-06 ENCOUNTER — Other Ambulatory Visit: Payer: Self-pay

## 2020-11-06 ENCOUNTER — Ambulatory Visit: Payer: Medicare Other | Attending: Internal Medicine | Admitting: Internal Medicine

## 2020-11-06 ENCOUNTER — Encounter: Payer: Self-pay | Admitting: Internal Medicine

## 2020-11-06 VITALS — BP 159/97 | HR 91 | Resp 19 | Ht 73.0 in | Wt 154.0 lb

## 2020-11-06 DIAGNOSIS — F321 Major depressive disorder, single episode, moderate: Secondary | ICD-10-CM

## 2020-11-06 DIAGNOSIS — I10 Essential (primary) hypertension: Secondary | ICD-10-CM | POA: Diagnosis not present

## 2020-11-06 DIAGNOSIS — F17200 Nicotine dependence, unspecified, uncomplicated: Secondary | ICD-10-CM | POA: Diagnosis not present

## 2020-11-06 DIAGNOSIS — R252 Cramp and spasm: Secondary | ICD-10-CM | POA: Diagnosis not present

## 2020-11-06 DIAGNOSIS — F17210 Nicotine dependence, cigarettes, uncomplicated: Secondary | ICD-10-CM

## 2020-11-06 MED ORDER — SERTRALINE HCL 50 MG PO TABS
ORAL_TABLET | ORAL | 5 refills | Status: DC
  Filled 2020-11-06: qty 30, 30d supply, fill #0
  Filled 2020-12-01: qty 30, 30d supply, fill #1
  Filled 2021-02-11: qty 30, 30d supply, fill #2

## 2020-11-06 MED ORDER — NICOTINE 7 MG/24HR TD PT24
7.0000 mg | MEDICATED_PATCH | Freq: Every day | TRANSDERMAL | 2 refills | Status: DC
  Filled 2020-11-06: qty 28, 28d supply, fill #0

## 2020-11-06 MED ORDER — CYCLOBENZAPRINE HCL 5 MG PO TABS
5.0000 mg | ORAL_TABLET | Freq: Two times a day (BID) | ORAL | 3 refills | Status: DC | PRN
  Filled 2020-11-06: qty 30, 15d supply, fill #0
  Filled 2020-12-01: qty 30, 15d supply, fill #1
  Filled 2021-02-11: qty 30, 15d supply, fill #2
  Filled 2021-03-09: qty 30, 15d supply, fill #3

## 2020-11-06 MED ORDER — HYDROCHLOROTHIAZIDE 12.5 MG PO CAPS
12.5000 mg | ORAL_CAPSULE | Freq: Every day | ORAL | 1 refills | Status: DC
  Filled 2020-11-06: qty 90, 90d supply, fill #0

## 2020-11-06 NOTE — Progress Notes (Signed)
Patient ID: Calvin [name], male    DOB: 02-13-1961  MRN: [id number]  CC: Hypertension   Subjective: Calvin [name] is a 60 y.o. male who presents for chronic ds management His concerns today include:  Pt with hx of HTN, chronic LBP, ED, tobacco, cocaine use, depression, HL   Patient tells me he is on gabapentin through his spine specialist.  Last saw him about a year ago.  He did several weeks of physical therapy earlier this year to help with strengthening in his legs and improve safety with gait.  He found it very helpful.  He plans to start going to the gym.  Still gets cramps in the legs especially the left leg.  He feels that the left leg has always been weaker than the right even before the surgery on his neck.  He has not had any recent falls.  He ambulates with a cane.    HYPERTENSION Currently taking: see medication list.  Out of HCTZ x 1 mth Med Adherence: [x]  Yes    []  No Medication side effects: []  Yes    []  No Adherence with salt restriction: [x]  Yes    []  No Home Monitoring?: [x]  Yes    []  No Monitoring Frequency: 2-3 x/wk Home BP results range: 130s/80-90 SOB? []  Yes    [x]  No Chest Pain?: []  Yes    [x]  No Leg swelling?: []  Yes    [x]  No Headaches?: []  Yes    [x]  No Dizziness? []  Yes    [x]  No Comments:   Tob: still at 2-3 cigarettes a day.  Had stopped the patches.  Plans to quit by 01/26/2021  Depression:  Out Zoloft for 2-3 wks. he finds it very helpful.  Pruritus to his depression is not being able to do as much as he used to in the past and having to depend on others as a result of that.  He denies any suicidal ideation..  Patient Active Problem List   Diagnosis Date Noted  . Severe sepsis ([place]) 08/07/2019  . Lobar pneumonia ([place]) 08/07/2019  . Diarrhea 08/07/2019  . Anemia 08/07/2019  . Foot laceration, right, initial encounter   . Radiculopathy 03/08/2018  . Tubular adenoma 05/16/2017  . Depressed mood 05/16/2017  . Substance abuse ([place]) 05/16/2017  . Hallux  valgus 08/03/2016  . Disc disorder   . Lumbar facet arthropathy 08/06/2014  . Lumbar radiculopathy 08/06/2014  . ED (erectile dysfunction) 01/07/2014  . Unspecified vitamin D deficiency 01/07/2014  . Left shoulder pain 08/10/2013  . Chronic low back pain 08/10/2013  . Essential hypertension 08/10/2013  . Tobacco abuse 08/10/2013     Current Outpatient Medications on File Prior to Visit  Medication Sig Dispense Refill  . gabapentin (NEURONTIN) 300 MG capsule Take 1 capsule by mouth 3 (three) times daily.     No current facility-administered medications on file prior to visit.    Allergies  Allergen Reactions  . Norvasc [Amlodipine]     Swelling in feet    Social History   Socioeconomic History  . Marital status: Single    Spouse name: Not on file  . Number of children: Not on file  . Years of education: Not on file  . Highest education level: Not on file  Occupational History  . Not on file  Tobacco Use  . Smoking status: Current Every Day Smoker    Packs/day: 0.10    Years: 20.00    Pack years: 2.00    Types: Cigarettes  .  Smokeless tobacco: Never Used  . Tobacco comment: 5 cigs a day   Vaping Use  . Vaping Use: Never used  Substance and Sexual Activity  . Alcohol use: No  . Drug use: No  . Sexual activity: Not Currently  Other Topics Concern  . Not on file  Social History Narrative  . Not on file   Social Determinants of Health   Financial Resource Strain: Not on file  Food Insecurity: Not on file  Transportation Needs: Not on file  Physical Activity: Not on file  Stress: Not on file  Social Connections: Not on file  Intimate Partner Violence: Not on file    Family History  Problem Relation Age of Onset  . Diabetes Mother   . Hypertension Mother   . Heart disease Mother   . Hyperlipidemia Mother   . Diabetes Brother   . Diabetes Daughter   . Hypertension Sister   . Diabetes Sister   . Stomach cancer Maternal Grandfather   . Colon cancer Neg  Hx   . Esophageal cancer Neg Hx   . Rectal cancer Neg Hx   . Colon polyps Neg Hx     Past Surgical History:  Procedure Laterality Date  . ANTERIOR CERVICAL DECOMP/DISCECTOMY FUSION N/A 03/08/2018   Procedure: ANTERIOR CERVICAL DECOMPRESSION/DISCECTOMY FUSION , cervical 3-4, cervical 4-5, cervical 5-6 with intrumentational and allograft;  Surgeon: Phylliss Bob, MD;  Location: [facility];  Service: Orthopedics;  Laterality: N/A;  . BONE BIOPSY Right 05/17/2018   Procedure: BONE BIOPSY X2;  Surgeon: Evelina Bucy, DPM;  Location: [facility];  Service: Podiatry;  Laterality: Right;  right second toe  . COLONOSCOPY    . FOOT SURGERY     right, ~ 2002  . POLYPECTOMY      ROS: Review of Systems Negative except as stated above  PHYSICAL EXAM: BP (!) 159/97   Pulse 91   Resp 19   Ht 6\' 1"  (1.854 m)   Wt 154 lb (69.9 kg)   SpO2 95%   BMI 20.32 kg/m   Wt Readings from Last 3 Encounters:  11/06/20 154 lb (69.9 kg)  03/27/20 150 lb (68 kg)  09/05/19 127 lb (57.6 kg)    Physical Exam  General appearance - alert, well appearing, and in no distress Mental status - normal mood, behavior, speech, dress, motor activity, and thought processes Chest - clear to auscultation, no wheezes, rales or rhonchi, symmetric air entry Heart - normal rate, regular rhythm, normal S1, S2, no murmurs, rubs, clicks or gallops Musculoskeletal -patient with a spastic scissoring gait.  He ambulates with a cane.  He is able to transfer onto the exam table independently. Extremities - peripheral pulses normal, no pedal edema, no clubbing or cyanosis Depression screen [facility] 2/9 11/06/2020 03/27/2020 09/05/2019  Decreased Interest 2 1 0  Down, Depressed, Hopeless 2 1 0  PHQ - 2 Score 4 2 0  Altered sleeping 3 3 0  Tired, decreased energy 3 3 0  Change in appetite 3 2 0  Feeling bad or failure about yourself  1 1 0  Trouble concentrating 2 2 0  Moving slowly or fidgety/restless 1 0 0  Suicidal  thoughts 0 0 0  PHQ-9 Score 17 13 0  Some recent data might be hidden    CMP Latest Ref Rng & Units 03/27/2020 08/12/2019 08/11/2019  Glucose 65 - 99 mg/dL 76 114(H) 96  BUN 6 - 24 mg/dL 22 16 11   Creatinine 0.76 -  1.27 mg/dL 1.36(H) 0.80 0.80  Sodium 134 - 144 mmol/L 138 135 136  Potassium 3.5 - 5.2 mmol/L 4.6 4.1 3.7  Chloride 96 - 106 mmol/L 101 103 105  CO2 20 - 29 mmol/L 27 23 24   Calcium 8.7 - 10.2 mg/dL 9.2 7.8(L) 7.6(L)  Total Protein 6.0 - 8.5 g/dL 7.7 - 5.6(L)  Total Bilirubin 0.0 - 1.2 mg/dL <0.2 - 0.6  Alkaline Phos 48 - 121 IU/L 108 - 115  AST 0 - 40 IU/L 28 - 22  ALT 0 - 44 IU/L 20 - 16   Lipid Panel     Component Value Date/Time   CHOL 176 03/27/2020 1617   TRIG 44 03/27/2020 1617   HDL 62 03/27/2020 1617   CHOLHDL 2.8 03/27/2020 1617   CHOLHDL 4.7 12/01/2015 1437   VLDL 18 12/01/2015 1437   LDLCALC 105 (H) 03/27/2020 1617    CBC    Component Value Date/Time   WBC 6.9 03/27/2020 1617   WBC 16.8 (H) 08/12/2019 0524   RBC 4.52 03/27/2020 1617   RBC 3.28 (L) 08/12/2019 0524   HGB 12.8 (L) 03/27/2020 1617   HCT 37.9 03/27/2020 1617   PLT 177 03/27/2020 1617   MCV 84 03/27/2020 1617   MCH 28.3 03/27/2020 1617   MCH 26.2 08/12/2019 0524   MCHC 33.8 03/27/2020 1617   MCHC 31.4 08/12/2019 0524   RDW 13.6 03/27/2020 1617   LYMPHSABS 2.2 09/05/2019 1423   MONOABS 1.4 (H) 08/12/2019 0524   EOSABS 0.2 09/05/2019 1423   BASOSABS 0.1 09/05/2019 1423    ASSESSMENT AND PLAN: 1. Essential hypertension Not at goal.  Patient has been out of hydrochlorothiazide for weeks.  Refill given as a 90-day supply to help with compliance.  DASH diet encouraged. - hydrochlorothiazide (MICROZIDE) 12.5 MG capsule; Take 1 capsule (12.5 mg total) by mouth daily.  Dispense: 90 capsule; Refill: 1  2. Moderate major depression ([place]) Refill given on Zoloft.  Patient feels that he does well with his on the dictation.  Denies any suicidal ideation.  Wants referral to behavioral  health - sertraline (ZOLOFT) 50 MG tablet; TAKE 1 TABLET BY MOUTH DAILY.  Dispense: 30 tablet; Refill: 5  3. Tobacco dependence Advised to quit.  Discussed health risks associated with smoking.  He is actively working on trying to quit and has set a quit date 4 July.  He is requesting refill patches finds helpful.  Follow-up to see whether he is exceeded in quitting smoking.  3 minutes spent on counseling.. - nicotine (NICODERM CQ - DOSED IN MG/24 HR) 7 mg/24hr patch; Place 1 patch (7 mg total) onto the skin daily.  Dispense: 28 patch; Refill: 2  4. Cramp of both lower extremities - cyclobenzaprine (FLEXERIL) 5 MG tablet; Take 1 tablet (5 mg total) by mouth 2 (two) times daily as needed for muscle spasms.  Dispense: 30 tablet; Refill: 3   Patient was given the opportunity to ask questions.  Patient verbalized understanding of the plan and was able to repeat key elements of the plan.   No orders of the defined types were placed in this encounter.    Requested Prescriptions   Signed Prescriptions Disp Refills  . nicotine (NICODERM CQ - DOSED IN MG/24 HR) 7 mg/24hr patch 28 patch 2    Sig: Place 1 patch (7 mg total) onto the skin daily.  . cyclobenzaprine (FLEXERIL) 5 MG tablet 30 tablet 3    Sig: Take 1 tablet (5 mg total) by mouth  2 (two) times daily as needed for muscle spasms.  . hydrochlorothiazide (MICROZIDE) 12.5 MG capsule 90 capsule 1    Sig: Take 1 capsule (12.5 mg total) by mouth daily.  . sertraline (ZOLOFT) 50 MG tablet 30 tablet 5    Sig: TAKE 1 TABLET BY MOUTH DAILY.    Return in about 4 months (around 03/08/2021) for Give appt with Lurena Joiner in 3 wks for [employer].  Karle Plumber, MD, FACP

## 2020-11-27 ENCOUNTER — Ambulatory Visit: Payer: Medicare Other | Admitting: Pharmacist

## 2020-11-27 NOTE — Progress Notes (Unsigned)
Pt is being seen for htn F/U  Last reading was 159/97  Goal is to be <130/80 mmHg  Currently on HCTZ 12.5 mg QD

## 2020-12-01 ENCOUNTER — Other Ambulatory Visit: Payer: Self-pay

## 2020-12-10 ENCOUNTER — Other Ambulatory Visit: Payer: Self-pay

## 2021-02-11 ENCOUNTER — Other Ambulatory Visit: Payer: Self-pay

## 2021-03-09 ENCOUNTER — Other Ambulatory Visit: Payer: Self-pay

## 2021-03-09 ENCOUNTER — Ambulatory Visit: Payer: Medicare Other | Attending: Internal Medicine | Admitting: Internal Medicine

## 2021-03-09 DIAGNOSIS — R252 Cramp and spasm: Secondary | ICD-10-CM

## 2021-03-09 DIAGNOSIS — I10 Essential (primary) hypertension: Secondary | ICD-10-CM | POA: Diagnosis not present

## 2021-03-09 DIAGNOSIS — Z87891 Personal history of nicotine dependence: Secondary | ICD-10-CM | POA: Diagnosis not present

## 2021-03-09 DIAGNOSIS — F321 Major depressive disorder, single episode, moderate: Secondary | ICD-10-CM

## 2021-03-09 DIAGNOSIS — Z23 Encounter for immunization: Secondary | ICD-10-CM

## 2021-03-09 MED ORDER — HYDROCHLOROTHIAZIDE 12.5 MG PO CAPS
12.5000 mg | ORAL_CAPSULE | Freq: Every day | ORAL | 1 refills | Status: DC
  Filled 2021-03-09: qty 90, 90d supply, fill #0

## 2021-03-09 MED ORDER — SERTRALINE HCL 100 MG PO TABS
ORAL_TABLET | ORAL | 4 refills | Status: DC
  Filled 2021-03-09: qty 30, 30d supply, fill #0
  Filled 2021-04-17: qty 30, 30d supply, fill #1
  Filled 2021-05-15: qty 30, 30d supply, fill #2
  Filled 2021-06-17: qty 30, 30d supply, fill #3
  Filled 2021-08-05: qty 30, 30d supply, fill #0

## 2021-03-09 NOTE — Progress Notes (Signed)
Patient ID: Calvin [name], male   DOB: 1961-01-25, 60 y.o.   MRN: [id number] Virtual Visit via Telephone Note  I connected with Calvin [name] on 03/09/2021 at 10:12 a.m by telephone and verified that I am speaking with the correct person using two identifiers  Location: Patient: home Provider: office  Participants: Myself Patient   I discussed the limitations, risks, security and privacy concerns of performing an evaluation and management service by telephone and the availability of in person appointments. I also discussed with the patient that there may be a patient responsible charge related to this service. The patient expressed understanding and agreed to proceed.   History of Present Illness: Pt with hx of HTN, chronic LBP, ED, tobacco, cocaine use, depression, HL.  Last evaluated 10/2020.  Today's visit is for chronic disease management.   HTN: restarted on HCTZ on last visit. Reports compliance with it. Has home BP device. Checks BP every other day.  Gives range 130s/mid 80s-90.  Last reading was 136/mid 80s before med -limits salt in foods No CP/SOB/LE edema  Tob dep:  Had set a quit date for 01/26/2021.  Reports he quit 3 wks ago.  Gets patches through [facility] for free.  Depression:  taking the Zoloft. Reports it has helped a lot but request increase dose.  Denies SI/HI  Request refill on Flexeril for cramps in the legs.  No falls. Goes to [facility] 3-4 x a mth to ride stationary bike and does some wgh lifting of 30-40 lbs  HM:  Reports having had Moderna booster shot 3-4 mths ago.  Due for Prevnar  20 vaccine. Outpatient Encounter Medications as of 03/09/2021  Medication Sig   cyclobenzaprine (FLEXERIL) 5 MG tablet Take 1 tablet (5 mg total) by mouth 2 (two) times daily as needed for muscle spasms.   gabapentin (NEURONTIN) 300 MG capsule Take 1 capsule by mouth 3 (three) times daily.   hydrochlorothiazide (MICROZIDE) 12.5 MG capsule Take 1 capsule (12.5 mg total) by mouth daily.   nicotine  (NICODERM CQ - DOSED IN MG/24 HR) 7 mg/24hr patch Place 1 patch (7 mg total) onto the skin daily.   sertraline (ZOLOFT) 50 MG tablet TAKE 1 TABLET BY MOUTH DAILY.   [DISCONTINUED] cyclobenzaprine (FLEXERIL) 5 MG tablet TAKE 1 TABLET (5 MG TOTAL) BY MOUTH 2 (TWO) TIMES DAILY AS NEEDED FOR MUSCLE SPASMS.   [DISCONTINUED] cyclobenzaprine (FLEXERIL) 5 MG tablet TAKE 1 TABLET (5 MG TOTAL) BY MOUTH 2 (TWO) TIMES DAILY AS NEEDED FOR MUSCLE SPASMS.   [DISCONTINUED] cyclobenzaprine (FLEXERIL) 5 MG tablet TAKE 1 TABLET (5 MG TOTAL) BY MOUTH 2 (TWO) TIMES DAILY AS NEEDED FOR MUSCLE SPASMS.   [DISCONTINUED] hydrochlorothiazide (MICROZIDE) 12.5 MG capsule TAKE 1 CAPSULE BY MOUTH (12.'5MG'$  TOTAL) BY MOUTH DAILY.   [DISCONTINUED] sertraline (ZOLOFT) 50 MG tablet TAKE 1 TABLET BY MOUTH DAILY.   [DISCONTINUED] sertraline (ZOLOFT) 50 MG tablet TAKE 1/2 TAB BY MOUTH DAILY FOR 2 WEEKS,THEN 1 TABLET BY MOUTH ONCE A DAY   [DISCONTINUED] sertraline (ZOLOFT) 50 MG tablet TAKE 1 TABLET BY MOUTH DAILY.   No facility-administered encounter medications on file as of 03/09/2021.    Observations/Objective: No direct observation done as this was a telephone encounter.  Assessment and Plan: 1. Essential hypertension Advised patient that goal for blood pressure is 130/80 or lower. We will have him see the clinical pharmacist in about 2 weeks.  Advised to bring his blood pressure readings with him and his home blood pressure device.  Take his medication before  coming. - CBC; Future - Comprehensive metabolic panel; Future - Lipid panel; Future - hydrochlorothiazide (MICROZIDE) 12.5 MG capsule; Take 1 capsule (12.5 mg total) by mouth daily.  Dispense: 90 capsule; Refill: 1  2. Moderate major depression ([place]) Patient reports improvement.  However he feels he would benefit from a higher dose of the Zoloft.  We have increased the Zoloft from 50 mg daily to 100 mg daily. - sertraline (ZOLOFT) 100 MG tablet; TAKE 1 TABLET BY MOUTH  DAILY.  Dispense: 30 tablet; Refill: 4  3. Former smoker Commended him on quitting smoking.  Encouraged him to remain tobacco free.  4. Cramp of both lower extremities He will continue Flexeril as needed.  Looks like he still has refills on his current prescription.  5. Need for vaccination against Streptococcus pneumoniae When he comes to see the clinical pharmacist he should be given the Prevnar 20 vaccine.   Follow Up Instructions: 4 months.   I discussed the assessment and treatment plan with the patient. The patient was provided an opportunity to ask questions and all were answered. The patient agreed with the plan and demonstrated an understanding of the instructions.   The patient was advised to call back or seek an in-person evaluation if the symptoms worsen or if the condition fails to improve as anticipated.  I  Spent 12 minutes on this telephone encounter  Karle Plumber, MD

## 2021-03-21 ENCOUNTER — Telehealth: Payer: Self-pay

## 2021-03-21 NOTE — Telephone Encounter (Signed)
Called pt to schedule annual wellness visit, no answer and mailbox full.

## 2021-04-01 ENCOUNTER — Telehealth: Payer: Self-pay

## 2021-04-01 NOTE — Telephone Encounter (Signed)
Pt has been scheduled and reminder has been mailed.  Pt was left a VM to call office and schedule appointment for PCV 20 vaccine

## 2021-04-01 NOTE — Telephone Encounter (Signed)
-----   Message from Ladell Pier, MD sent at 03/09/2021 10:27 AM EDT ----- F/u with me in 4 mths Give appt with Lurena Joiner in 2 wks for BP check and to get Prevnar 20 vaccine

## 2021-04-04 ENCOUNTER — Ambulatory Visit (HOSPITAL_BASED_OUTPATIENT_CLINIC_OR_DEPARTMENT_OTHER): Payer: Medicare Other

## 2021-04-04 ENCOUNTER — Telehealth: Payer: Self-pay

## 2021-04-04 DIAGNOSIS — Z0000 Encounter for general adult medical examination without abnormal findings: Secondary | ICD-10-CM

## 2021-04-04 NOTE — Patient Instructions (Signed)
Health Maintenance, Male Adopting a healthy lifestyle and getting preventive care are important in promoting health and wellness. Ask your health care provider about: The right schedule for you to have regular tests and exams. Things you can do on your own to prevent diseases and keep yourself healthy. What should I know about diet, weight, and exercise? Eat a healthy diet  Eat a diet that includes plenty of vegetables, fruits, low-fat dairy products, and lean protein. Do not eat a lot of foods that are high in solid fats, added sugars, or sodium. Maintain a healthy weight Body mass index (BMI) is a measurement that can be used to identify possible weight problems. It estimates body fat based on height and weight. Your health care provider can help determine your BMI and help you achieve or maintain a healthy weight. Get regular exercise Get regular exercise. This is one of the most important things you can do for your health. Most adults should: Exercise for at least 150 minutes each week. The exercise should increase your heart rate and make you sweat (moderate-intensity exercise). Do strengthening exercises at least twice a week. This is in addition to the moderate-intensity exercise. Spend less time sitting. Even light physical activity can be beneficial. Watch cholesterol and blood lipids Have your blood tested for lipids and cholesterol at 60 years of age, then have this test every 5 years. You may need to have your cholesterol levels checked more often if: Your lipid or cholesterol levels are high. You are older than 60 years of age. You are at high risk for heart disease. What should I know about cancer screening? Many types of cancers can be detected early and may often be prevented. Depending on your health history and family history, you may need to have cancer screening at various ages. This may include screening for: Colorectal cancer. Prostate cancer. Skin cancer. Lung  cancer. What should I know about heart disease, diabetes, and high blood pressure? Blood pressure and heart disease High blood pressure causes heart disease and increases the risk of stroke. This is more likely to develop in people who have high blood pressure readings, are of African descent, or are overweight. Talk with your health care provider about your target blood pressure readings. Have your blood pressure checked: Every 3-5 years if you are 18-39 years of age. Every year if you are 40 years old or older. If you are between the ages of 65 and 75 and are a current or former smoker, ask your health care provider if you should have a one-time screening for abdominal aortic aneurysm (AAA). Diabetes Have regular diabetes screenings. This checks your fasting blood sugar level. Have the screening done: Once every three years after age 45 if you are at a normal weight and have a low risk for diabetes. More often and at a younger age if you are overweight or have a high risk for diabetes. What should I know about preventing infection? Hepatitis B If you have a higher risk for hepatitis B, you should be screened for this virus. Talk with your health care provider to find out if you are at risk for hepatitis B infection. Hepatitis C Blood testing is recommended for: Everyone born from 1945 through 1965. Anyone with known risk factors for hepatitis C. Sexually transmitted infections (STIs) You should be screened each year for STIs, including gonorrhea and chlamydia, if: You are sexually active and are younger than 60 years of age. You are older than 60 years   of age and your health care provider tells you that you are at risk for this type of infection. Your sexual activity has changed since you were last screened, and you are at increased risk for chlamydia or gonorrhea. Ask your health care provider if you are at risk. Ask your health care provider about whether you are at high risk for HIV.  Your health care provider may recommend a prescription medicine to help prevent HIV infection. If you choose to take medicine to prevent HIV, you should first get tested for HIV. You should then be tested every 3 months for as long as you are taking the medicine. Follow these instructions at home: Lifestyle Do not use any products that contain nicotine or tobacco, such as cigarettes, e-cigarettes, and chewing tobacco. If you need help quitting, ask your health care provider. Do not use street drugs. Do not share needles. Ask your health care provider for help if you need support or information about quitting drugs. Alcohol use Do not drink alcohol if your health care provider tells you not to drink. If you drink alcohol: Limit how much you have to 0-2 drinks a day. Be aware of how much alcohol is in your drink. In the [place] one drink equals one 12 oz bottle of beer (355 mL), one 5 oz glass of wine (148 mL), or one 1 oz glass of hard liquor (44 mL). General instructions Schedule regular health, dental, and eye exams. Stay current with your vaccines. Tell your health care provider if: You often feel depressed. You have ever been abused or do not feel safe at home. Summary Adopting a healthy lifestyle and getting preventive care are important in promoting health and wellness. Follow your health care provider's instructions about healthy diet, exercising, and getting tested or screened for diseases. Follow your health care provider's instructions on monitoring your cholesterol and blood pressure. This information is not intended to replace advice given to you by your health care provider. Make sure you discuss any questions you have with your health care provider. Document Revised: 09/19/2020 Document Reviewed: 07/05/2018 Elsevier Patient Education  2022 Elsevier Inc.  

## 2021-04-04 NOTE — Telephone Encounter (Signed)
Called pt for his 11:00 annual wellness visit, No answer called twice. Will try to call again.

## 2021-04-04 NOTE — Progress Notes (Signed)
Unable to reach patient.

## 2021-04-08 ENCOUNTER — Telehealth: Payer: Self-pay | Admitting: Internal Medicine

## 2021-04-08 NOTE — Telephone Encounter (Signed)
Copied from [place] [phone] [phone]. Topic: General - Other >> Apr 07, 2021  3:37 PM Leward Quan A wrote: Reason for CRM: Tiffany with [facility] called in to inform  Dr Wynetta Emery that patient will be receiving treatment of  Sublocade injection with them and need to know if this treatment can be done here by his provider. Please call with an answer to Tiffany at Ph# [phone] ext# [phone]

## 2021-04-08 NOTE — Telephone Encounter (Signed)
No we do not

## 2021-04-09 NOTE — Telephone Encounter (Signed)
Left message on voicemail for Tiffany with the treatment center to return call.   Will forward to [place] to see about resources.

## 2021-04-10 NOTE — Telephone Encounter (Signed)
Per Tiffany with [facility], Calvin [name] is looking to be discharged on 04/16/2021.   [name] or [name]- she will need from [name]-  The name of the facility that can do the injections so they will know how many days to write the bridge script for.   The treatment facility is working on getting him established with a provider that will be able to continue to prescribe the medication.

## 2021-04-10 NOTE — Telephone Encounter (Signed)
Unsure about discharge.   Left message on voicemail and extension given for Tiffany with the treatment center.

## 2021-04-13 NOTE — Telephone Encounter (Signed)
Reached out to [place] at the [facility]. She was unavailable, left a voicemail on her extension.  With Dr. Wynetta Emery being unable to prescribe this medication, the treatment facility would need to coordinate with a local facility able to provide Sublocade injection.   Here are a few locations affiliated with Sublocade:   [facility], [place]  [address], [place], [place] [address] Phone: [phone]

## 2021-04-17 ENCOUNTER — Other Ambulatory Visit: Payer: Self-pay | Admitting: Internal Medicine

## 2021-04-17 ENCOUNTER — Other Ambulatory Visit: Payer: Self-pay

## 2021-04-17 DIAGNOSIS — R252 Cramp and spasm: Secondary | ICD-10-CM

## 2021-04-17 MED ORDER — CYCLOBENZAPRINE HCL 5 MG PO TABS
5.0000 mg | ORAL_TABLET | Freq: Two times a day (BID) | ORAL | 1 refills | Status: DC | PRN
  Filled 2021-04-17: qty 30, 15d supply, fill #0
  Filled 2021-05-15: qty 30, 15d supply, fill #1

## 2021-04-17 NOTE — Telephone Encounter (Signed)
Requested medication (s) are due for refill today - yes  Requested medication (s) are on the active medication list -yes  Future visit scheduled -yes  Last refill: 11/06/20 #30 3RF  Notes to clinic: Request RF: non delegated Rx  Requested Prescriptions  Pending Prescriptions Disp Refills   cyclobenzaprine (FLEXERIL) 5 MG tablet 30 tablet 3    Sig: Take 1 tablet (5 mg total) by mouth 2 (two) times daily as needed for muscle spasms.     Not Delegated - Analgesics:  Muscle Relaxants Failed - 04/17/2021 10:16 AM      Failed - This refill cannot be delegated      Passed - Valid encounter within last 6 months    Recent Outpatient Visits           1 month ago Essential hypertension   [place], Deborah B, MD   5 months ago Essential hypertension   [place], Deborah B, MD   1 year ago Essential hypertension   [place], Deborah B, MD   1 year ago Leukocytosis, unspecified type   [place], [place], [place]   2 years ago Closed fracture of left hip, initial encounter [facility])   [place], MD       Future Appointments             In 3 weeks Ladell Pier, MD [place]   In 3 months Ladell Pier, MD [place]               Requested Prescriptions  Pending Prescriptions Disp Refills   cyclobenzaprine (FLEXERIL) 5 MG tablet 30 tablet 3    Sig: Take 1 tablet (5 mg total) by mouth 2 (two) times daily as needed for muscle spasms.     Not Delegated - Analgesics:  Muscle Relaxants Failed - 04/17/2021 10:16 AM      Failed - This refill cannot be delegated      Passed - Valid encounter within last 6 months    Recent Outpatient Visits           1 month ago Essential hypertension   [place], MD   5 months ago Essential hypertension   [place], Deborah B, MD   1 year ago Essential hypertension   [place], MD   1 year ago Leukocytosis, unspecified type   [place], [place]   2 years ago Closed fracture of left hip, initial encounter [facility])   [place], MD       Future Appointments             In 3 weeks Ladell Pier, MD [place]   In 3 months Wynetta Emery, Dalbert Batman, MD [place]

## 2021-04-20 ENCOUNTER — Other Ambulatory Visit: Payer: Self-pay

## 2021-04-21 ENCOUNTER — Telehealth: Payer: Self-pay | Admitting: Internal Medicine

## 2021-04-21 NOTE — Telephone Encounter (Signed)
Copied from [place] [id number]. Topic: General - Other >> Apr 14, 2021  3:54 PM Yvette Rack wrote: Reason for CRM: Paige with [facility] asked if there is a provider who does Sublocade or Subutex. Cb# [phone] Ext [phone]

## 2021-04-21 NOTE — Telephone Encounter (Signed)
Per [place] last note in the office enounter "the treatment facility would need to coordinate with a local facility able to provide Sublocade injection."

## 2021-04-21 NOTE — Telephone Encounter (Signed)
Returned Arby Barrette from [facility] call and lvm  Per Brayton Layman last note in telephone encounter these are the treatment center pt can go to   Here are a few locations affiliated with Sublocade:    [employer] [place]  [place] [address] [place], [place] [address] Phone: [phone]

## 2021-04-21 NOTE — Telephone Encounter (Signed)
Thank you [name].

## 2021-04-27 DIAGNOSIS — Z79891 Long term (current) use of opiate analgesic: Secondary | ICD-10-CM | POA: Diagnosis not present

## 2021-05-06 DIAGNOSIS — Z79891 Long term (current) use of opiate analgesic: Secondary | ICD-10-CM | POA: Diagnosis not present

## 2021-05-08 ENCOUNTER — Inpatient Hospital Stay: Payer: Medicare Other | Admitting: Internal Medicine

## 2021-05-15 ENCOUNTER — Other Ambulatory Visit: Payer: Self-pay

## 2021-05-20 DIAGNOSIS — Z79891 Long term (current) use of opiate analgesic: Secondary | ICD-10-CM | POA: Diagnosis not present

## 2021-06-17 ENCOUNTER — Other Ambulatory Visit: Payer: Self-pay

## 2021-06-17 ENCOUNTER — Other Ambulatory Visit: Payer: Self-pay | Admitting: Internal Medicine

## 2021-06-17 DIAGNOSIS — R252 Cramp and spasm: Secondary | ICD-10-CM

## 2021-06-17 MED ORDER — CYCLOBENZAPRINE HCL 5 MG PO TABS
5.0000 mg | ORAL_TABLET | Freq: Two times a day (BID) | ORAL | 1 refills | Status: DC | PRN
  Filled 2021-06-17 – 2021-08-05 (×2): qty 30, 15d supply, fill #0

## 2021-06-17 NOTE — Telephone Encounter (Signed)
Requested medication (s) are due for refill today - yes  Requested medication (s) are on the active medication list -yes  Future visit scheduled -yes  Last refill: 04/17/21 #30 1RF  Notes to clinic: Request RF: non delegated Rx  Requested Prescriptions  Pending Prescriptions Disp Refills   cyclobenzaprine (FLEXERIL) 5 MG tablet 30 tablet 1    Sig: Take 1 tablet (5 mg total) by mouth 2 (two) times daily as needed for muscle spasms.     Not Delegated - Analgesics:  Muscle Relaxants Failed - 06/17/2021  9:09 AM      Failed - This refill cannot be delegated      Passed - Valid encounter within last 6 months    Recent Outpatient Visits           3 months ago Essential hypertension   [place], MD   7 months ago Essential hypertension   [place], Deborah B, MD   1 year ago Essential hypertension   [place], Deborah B, MD   1 year ago Leukocytosis, unspecified type   [place], [place], [place]   2 years ago Closed fracture of left hip, initial encounter [facility])   [place] Ladell Pier, MD       Future Appointments             In 1 month Ladell Pier, MD [place]               Requested Prescriptions  Pending Prescriptions Disp Refills   cyclobenzaprine (FLEXERIL) 5 MG tablet 30 tablet 1    Sig: Take 1 tablet (5 mg total) by mouth 2 (two) times daily as needed for muscle spasms.     Not Delegated - Analgesics:  Muscle Relaxants Failed - 06/17/2021  9:09 AM      Failed - This refill cannot be delegated      Passed - Valid encounter within last 6 months    Recent Outpatient Visits           3 months ago Essential hypertension   [place], MD   7 months ago Essential  hypertension   [place], Deborah B, MD   1 year ago Essential hypertension   [place], MD   1 year ago Leukocytosis, unspecified type   [place], [place]   2 years ago Closed fracture of left hip, initial encounter [facility])   [place], MD       Future Appointments             In 1 month Wynetta Emery Dalbert Batman, MD [place]

## 2021-06-22 ENCOUNTER — Other Ambulatory Visit: Payer: Self-pay

## 2021-06-26 ENCOUNTER — Telehealth: Payer: Self-pay | Admitting: Internal Medicine

## 2021-06-26 NOTE — Telephone Encounter (Signed)
Pt stated he usually uses a clinic in [place], Dr Raymon Mutton orthopedics Michela Pitcher will call back on Monday with more informations.

## 2021-06-26 NOTE — Telephone Encounter (Signed)
Tamika from transportation dept called in sya needs letter from Dr Wynetta Emery to show them why he needs the appt with ortho.

## 2021-07-01 ENCOUNTER — Telehealth: Payer: Self-pay | Admitting: Internal Medicine

## 2021-07-01 DIAGNOSIS — Z79891 Long term (current) use of opiate analgesic: Secondary | ICD-10-CM | POA: Diagnosis not present

## 2021-07-01 NOTE — Telephone Encounter (Signed)
Copied from [place] [id number]. Topic: Referral - Request for Referral >> Jun 30, 2021  8:43 AM Tessa Lerner A wrote: Has patient seen PCP for this complaint? Yes.   *If NO, is insurance requiring patient see PCP for this issue before PCP can refer them? Referral for which specialty: Orthopedic Surgeon  Preferred provider/office: Dr. Ricki Rodriguez / [facility]  Reason for referral: Patient is in need of hip/leg surgery

## 2021-07-01 NOTE — Telephone Encounter (Signed)
Will forward to provider  

## 2021-07-01 NOTE — Telephone Encounter (Signed)
Reached out to pt per Opal Sidles. Pt states he rescheduled his appt with [facility] for Jan 23 and he has transportation already set up

## 2021-08-03 ENCOUNTER — Encounter: Payer: Self-pay | Admitting: Internal Medicine

## 2021-08-03 ENCOUNTER — Other Ambulatory Visit: Payer: Self-pay

## 2021-08-03 ENCOUNTER — Ambulatory Visit: Payer: Commercial Managed Care - HMO | Attending: Internal Medicine | Admitting: Internal Medicine

## 2021-08-03 VITALS — BP 169/100 | HR 70 | Resp 16 | Wt 167.6 lb

## 2021-08-03 DIAGNOSIS — I10 Essential (primary) hypertension: Secondary | ICD-10-CM | POA: Diagnosis not present

## 2021-08-03 DIAGNOSIS — Z23 Encounter for immunization: Secondary | ICD-10-CM

## 2021-08-03 DIAGNOSIS — F17200 Nicotine dependence, unspecified, uncomplicated: Secondary | ICD-10-CM | POA: Diagnosis not present

## 2021-08-03 DIAGNOSIS — Z125 Encounter for screening for malignant neoplasm of prostate: Secondary | ICD-10-CM

## 2021-08-03 DIAGNOSIS — F1191 Opioid use, unspecified, in remission: Secondary | ICD-10-CM | POA: Insufficient documentation

## 2021-08-03 DIAGNOSIS — G4700 Insomnia, unspecified: Secondary | ICD-10-CM

## 2021-08-03 DIAGNOSIS — F331 Major depressive disorder, recurrent, moderate: Secondary | ICD-10-CM | POA: Diagnosis not present

## 2021-08-03 HISTORY — DX: Nicotine dependence, unspecified, uncomplicated: F17.200

## 2021-08-03 MED ORDER — VALSARTAN 40 MG PO TABS
40.0000 mg | ORAL_TABLET | Freq: Every day | ORAL | 5 refills | Status: DC
  Filled 2021-08-03: qty 30, 30d supply, fill #0

## 2021-08-03 MED ORDER — MELATONIN 3 MG PO TABS
3.0000 mg | ORAL_TABLET | Freq: Every day | ORAL | 5 refills | Status: DC
  Filled 2021-08-03: qty 30, 30d supply, fill #0

## 2021-08-03 MED ORDER — ZOSTER VAC RECOMB ADJUVANTED 50 MCG/0.5ML IM SUSR
0.5000 mL | Freq: Once | INTRAMUSCULAR | 0 refills | Status: DC
  Filled 2021-08-03: qty 1, 1d supply, fill #0

## 2021-08-03 MED ORDER — ZOSTER VAC RECOMB ADJUVANTED 50 MCG/0.5ML IM SUSR: 0.5000 mL | Freq: Once | INTRAMUSCULAR | 0 refills | Status: DC

## 2021-08-03 MED ORDER — ZOSTER VAC RECOMB ADJUVANTED 50 MCG/0.5ML IM SUSR: 0.5000 mL | Freq: Once | INTRAMUSCULAR | 1 refills | Status: AC

## 2021-08-03 NOTE — Patient Instructions (Addendum)
Call 1-800-Quit Now to get the nicotine patches and gum.  Your blood pressure is not at goal.  We have added another blood pressure medication called Diovan 40 mg daily.  Continue the hydrochlorothiazide.  Check your blood pressure at least twice a week and write down the readings.  Bring those readings in with you when you come to see the clinical pharmacist in 2 weeks.

## 2021-08-03 NOTE — Progress Notes (Signed)
Patient ID: Calvin [name], male    DOB: May 11, 1961  MRN: [id number]  CC: Chronic disease management.   Subjective: Calvin [name] is a 61 y.o. male who presents for chronic disease management. His concerns today include:  Pt with hx of HTN, chronic LBP, ED, tobacco, cocaine use, depression, HL.   Will be seeing ortho at [facility] 08/17/2021 for the LT hip. Had fx hip 2 yrs ago.  Healed with LT leg being shorter than RT and causing chronic pain.  Tob dep: had reported quitting on last visit.  However, since then he started smoking again about 2 x awk.  Wants to quit.  Request the 1-800# to call to get the patches and gum for free  HYPERTENSION Currently taking: see medication list - HCTZ and took already for today Med Adherence: [x]  Yes    []  No Medication side effects: []  Yes    [x]  No Adherence with salt restriction: [x]  Yes    []  No Home Monitoring?: [x]  Yes    []  No Monitoring Frequency: 3x/wk Home BP results range: 130s/mid 80s SOB? []  Yes    [x]  No Chest Pain?: []  Yes    [x]  No Leg swelling?: []  Yes    [x]  No Headaches?: []  Yes    [x]  No Dizziness? []  Yes    [x]  No Comments:   Dep:  out of Zoloft x 1 mth Reports he just feel bored a lot.  Has a friend who goes with him to gym 3x/wk C/o problems sleeping x 6 mths.  "Hard for me to shut my mind down sometimes."  Gets in bed b/w 9-10:30 p.m. Sleeps with TV on.  Drinks green tea 2x/day.  Last cup at 6 p.m -once in bed, takes 3 hrs to fall asleep. Used OTC Melatonin which helped some.   On Sublocade inj once a mth for opioid addiction (Percocets and heroin).  He was started on it through [facility] where he went in the fall to clean himself out.  Clean x 4 mths.  Also clean off cocaine. Now Sublocade through  a treatment center in [place].   Patient Active Problem List   Diagnosis Date Noted   Severe sepsis ([place]) 08/07/2019   Lobar pneumonia ([place]) 08/07/2019   Diarrhea 08/07/2019   Anemia 08/07/2019   Foot laceration,  right, initial encounter    Radiculopathy 03/08/2018   Tubular adenoma 05/16/2017   Depressed mood 05/16/2017   Substance abuse ([place]) 05/16/2017   Hallux valgus 08/03/2016   Disc disorder    Lumbar facet arthropathy 08/06/2014   Lumbar radiculopathy 08/06/2014   ED (erectile dysfunction) 01/07/2014   Unspecified vitamin D deficiency 01/07/2014   Left shoulder pain 08/10/2013   Chronic low back pain 08/10/2013   Essential hypertension 08/10/2013   Tobacco abuse 08/10/2013     Current Outpatient Medications on File Prior to Visit  Medication Sig Dispense Refill   cyclobenzaprine (FLEXERIL) 5 MG tablet Take 1 tablet (5 mg total) by mouth 2 (two) times daily as needed for muscle spasms. 30 tablet 1   gabapentin (NEURONTIN) 300 MG capsule Take 1 capsule by mouth 3 (three) times daily.     hydrochlorothiazide (MICROZIDE) 12.5 MG capsule Take 1 capsule (12.5 mg total) by mouth daily. 90 capsule 1   nicotine (NICODERM CQ - DOSED IN MG/24 HR) 7 mg/24hr patch Place 1 patch (7 mg total) onto the skin daily. 28 patch 2   sertraline (ZOLOFT) 100 MG tablet TAKE 1 TABLET BY MOUTH  DAILY. 30 tablet 4   SUBLOCADE 300 MG/1.5ML SOSY Inject into the skin.     No current facility-administered medications on file prior to visit.    Allergies  Allergen Reactions   Norvasc [Amlodipine]     Swelling in feet    Social History   Socioeconomic History   Marital status: Single    Spouse name: Not on file   Number of children: Not on file   Years of education: Not on file   Highest education level: Not on file  Occupational History   Not on file  Tobacco Use   Smoking status: Every Day    Packs/day: 0.10    Years: 20.00    Pack years: 2.00    Types: Cigarettes   Smokeless tobacco: Never   Tobacco comments:    5 cigs a day   Vaping Use   Vaping Use: Never used  Substance and Sexual Activity   Alcohol use: No   Drug use: No   Sexual activity: Not Currently  Other Topics Concern   Not on  file  Social History Narrative   Not on file   Social Determinants of Health   Financial Resource Strain: Not on file  Food Insecurity: Not on file  Transportation Needs: Not on file  Physical Activity: Not on file  Stress: Not on file  Social Connections: Not on file  Intimate Partner Violence: Not on file    Family History  Problem Relation Age of Onset   Diabetes Mother    Hypertension Mother    Heart disease Mother    Hyperlipidemia Mother    Diabetes Brother    Diabetes Daughter    Hypertension Sister    Diabetes Sister    Stomach cancer Maternal Grandfather    Colon cancer Neg Hx    Esophageal cancer Neg Hx    Rectal cancer Neg Hx    Colon polyps Neg Hx     Past Surgical History:  Procedure Laterality Date   ANTERIOR CERVICAL DECOMP/DISCECTOMY FUSION N/A 03/08/2018   Procedure: ANTERIOR CERVICAL DECOMPRESSION/DISCECTOMY FUSION , cervical 3-4, cervical 4-5, cervical 5-6 with intrumentational and allograft;  Surgeon: Phylliss Bob, MD;  Location: [facility];  Service: Orthopedics;  Laterality: N/A;   BONE BIOPSY Right 05/17/2018   Procedure: BONE BIOPSY X2;  Surgeon: Evelina Bucy, DPM;  Location: [facility];  Service: Podiatry;  Laterality: Right;  right second toe   COLONOSCOPY     FOOT SURGERY     right, ~ 2002   POLYPECTOMY      ROS: Review of Systems Negative except as stated above  PHYSICAL EXAM: BP (!) 169/100    Pulse 70    Resp 16    Wt 167 lb 9.6 oz (76 kg)    SpO2 99%    BMI 22.11 kg/m   Wt Readings from Last 3 Encounters:  08/03/21 167 lb 9.6 oz (76 kg)  11/06/20 154 lb (69.9 kg)  03/27/20 150 lb (68 kg)    Physical Exam BP 177/106  General appearance - alert, well appearing, older African-American male and in no distress Mental status - normal mood, behavior, speech, dress, motor activity, and thought processes Neck - supple, no significant adenopathy Chest - clear to auscultation, no wheezes, rales or rhonchi, symmetric  air entry Heart - normal rate, regular rhythm, normal S1, S2, no murmurs, rubs, clicks or gallops Extremities - peripheral pulses normal, no pedal edema, no clubbing or cyanosis  Depression screen PHQ  2/9 08/03/2021 11/06/2020 03/27/2020  Decreased Interest 2 2 1   Down, Depressed, Hopeless 2 2 1   PHQ - 2 Score 4 4 2   Altered sleeping 0 3 3  Tired, decreased energy 3 3 3   Change in appetite 3 3 2   Feeling bad or failure about yourself  1 1 1   Trouble concentrating 2 2 2   Moving slowly or fidgety/restless 0 1 0  Suicidal thoughts 0 0 0  PHQ-9 Score 13 17 13   Some recent data might be hidden   GAD 7 : Generalized Anxiety Score 08/03/2021 11/06/2020 03/27/2020 09/05/2019  Nervous, Anxious, on Edge 3 2 2 1   Control/stop worrying 3 2 1  0  Worry too much - different things 3 2 2  0  Trouble relaxing 3 3 3 1   Restless 1 2 2 1   Easily annoyed or irritable 2 2 1  0  Afraid - awful might happen 2 1 2  0  Total GAD 7 Score 17 14 13 3   Anxiety Difficulty - - - -     CMP Latest Ref Rng & Units 03/27/2020 08/12/2019 08/11/2019  Glucose 65 - 99 mg/dL 76 114(H) 96  BUN 6 - 24 mg/dL 22 16 11   Creatinine 0.76 - 1.27 mg/dL 1.36(H) 0.80 0.80  Sodium 134 - 144 mmol/L 138 135 136  Potassium 3.5 - 5.2 mmol/L 4.6 4.1 3.7  Chloride 96 - 106 mmol/L 101 103 105  CO2 20 - 29 mmol/L 27 23 24   Calcium 8.7 - 10.2 mg/dL 9.2 7.8(L) 7.6(L)  Total Protein 6.0 - 8.5 g/dL 7.7 - 5.6(L)  Total Bilirubin 0.0 - 1.2 mg/dL <0.2 - 0.6  Alkaline Phos 48 - 121 IU/L 108 - 115  AST 0 - 40 IU/L 28 - 22  ALT 0 - 44 IU/L 20 - 16   Lipid Panel     Component Value Date/Time   CHOL 176 03/27/2020 1617   TRIG 44 03/27/2020 1617   HDL 62 03/27/2020 1617   CHOLHDL 2.8 03/27/2020 1617   CHOLHDL 4.7 12/01/2015 1437   VLDL 18 12/01/2015 1437   LDLCALC 105 (H) 03/27/2020 1617    CBC    Component Value Date/Time   WBC 6.9 03/27/2020 1617   WBC 16.8 (H) 08/12/2019 0524   RBC 4.52 03/27/2020 1617   RBC 3.28 (L) 08/12/2019 0524   HGB  12.8 (L) 03/27/2020 1617   HCT 37.9 03/27/2020 1617   PLT 177 03/27/2020 1617   MCV 84 03/27/2020 1617   MCH 28.3 03/27/2020 1617   MCH 26.2 08/12/2019 0524   MCHC 33.8 03/27/2020 1617   MCHC 31.4 08/12/2019 0524   RDW 13.6 03/27/2020 1617   LYMPHSABS 2.2 09/05/2019 1423   MONOABS 1.4 (H) 08/12/2019 0524   EOSABS 0.2 09/05/2019 1423   BASOSABS 0.1 09/05/2019 1423    ASSESSMENT AND PLAN: 1. Essential hypertension Uncontrolled.  Continue HCTZ.  Add Diovan.  Check chemistry today.  He did not tolerate Norvasc in the past due to lower extremity edema.  Follow-up with clinical pharmacist in 1 to 2 weeks for repeat blood pressure check. - CBC - Comprehensive metabolic panel - Lipid panel - valsartan (DIOVAN) 40 MG tablet; Take 1 tablet (40 mg total) by mouth daily.  Dispense: 30 tablet; Refill: 5  2. Tobacco dependence Pt is current smoker. Patient advised to quit smoking. Discussed health risks associated with smoking including lung and other types of cancers, chronic lung diseases and CV risks.. Pt ready/not ready to give trail of quitting.  Discussed methods to help quit including quitting cold [place], use of NRT, Chantix and Bupropion.  Pt wanting to try: Patient wanting to try the nicotine patches and gum.  He plans to call 1 [phone] quit now to request these. _3_ Minutes spent on counseling. F/U: Reassess progress on subsequent visit   3. Insomnia, unspecified type Good sleep hygiene discussed and encouraged. Patient advised not to drink any caffeinated beverages or excessive alcohol use within several hours of bedtime.  Advised to get in bed around about the same time every night.  Once in bed, turn off all lights and sounds.  If unable to fall asleep within 30 to 45 minutes of getting in bed, patient should get up and try to do something until he feels sleepy again.  At that time try getting back in bed. -Since he has found melatonin to be helpful, we agreed to have him continue  with melatonin. - melatonin 3 MG TABS tablet; Take 1 tablet (3 mg total) by mouth at bedtime.  Dispense: 30 tablet; Refill: 5  4. Major depressive disorder, recurrent episode, moderate (HCC) Refill sent from [facility].  5. Opioid use disorder in remission Currently in a treatment program on Sublocade.  Commended him on being free of heroin and prescription street drugs.  6. Prostate cancer screening He is agreeable for prostate cancer screening with PSA. - PSA  7. Need for vaccination against Streptococcus pneumoniae - PNEUMOCOCCAL CONJUGATE VACCINE 15-VALENT  8. Need for shingles vaccine Patient agreeable to receiving the Shingrix vaccine.  Prescription given to him so that he can take it to his outside pharmacy to be filled and given the shot.    Patient was given the opportunity to ask questions.  Patient verbalized understanding of the plan and was able to repeat key elements of the plan.   Orders Placed This Encounter  Procedures   PNEUMOCOCCAL CONJUGATE VACCINE 15-VALENT   CBC   Comprehensive metabolic panel   Lipid panel   PSA     Requested Prescriptions   Signed Prescriptions Disp Refills   melatonin 3 MG TABS tablet 30 tablet 5    Sig: Take 1 tablet (3 mg total) by mouth at bedtime.   valsartan (DIOVAN) 40 MG tablet 30 tablet 5    Sig: Take 1 tablet (40 mg total) by mouth daily.   Zoster Vaccine Adjuvanted [facility]) injection 0.5 mL 1    Sig: Inject 0.5 mLs into the muscle once for 1 dose.    Return in about 4 months (around 12/01/2021) for Appt with [facility] in 2 wks for BP check.  Karle Plumber, MD, FACP

## 2021-08-04 LAB — CBC
Hematocrit: 37.4 % — ABNORMAL LOW (ref 37.5–51.0)
Hemoglobin: 12.3 g/dL — ABNORMAL LOW (ref 13.0–17.7)
MCH: 27.7 pg (ref 26.6–33.0)
MCHC: 32.9 g/dL (ref 31.5–35.7)
MCV: 84 fL (ref 79–97)
Platelets: 227 10*3/uL (ref 150–450)
RBC: 4.44 x10E6/uL (ref 4.14–5.80)
RDW: 14.9 % (ref 11.6–15.4)
WBC: 7.3 10*3/uL (ref 3.4–10.8)

## 2021-08-04 LAB — COMPREHENSIVE METABOLIC PANEL
ALT: 15 IU/L (ref 0–44)
AST: 22 IU/L (ref 0–40)
Albumin/Globulin Ratio: 1.5 (ref 1.2–2.2)
Albumin: 4.4 g/dL (ref 3.8–4.9)
Alkaline Phosphatase: 109 IU/L (ref 44–121)
BUN/Creatinine Ratio: 13 (ref 10–24)
BUN: 19 mg/dL (ref 8–27)
Bilirubin Total: <0.2 mg/dL (ref 0.0–1.2)
CO2: 21 mmol/L (ref 20–29)
Calcium: 9 mg/dL (ref 8.6–10.2)
Chloride: 102 mmol/L (ref 96–106)
Creatinine, Ser: 1.43 mg/dL — ABNORMAL HIGH (ref 0.76–1.27)
Globulin, Total: 3 g/dL (ref 1.5–4.5)
Glucose: 87 mg/dL (ref 70–99)
Potassium: 4.6 mmol/L (ref 3.5–5.2)
Sodium: 136 mmol/L (ref 134–144)
Total Protein: 7.4 g/dL (ref 6.0–8.5)
eGFR: 56 mL/min/{1.73_m2} — ABNORMAL LOW (ref >59)

## 2021-08-04 LAB — LIPID PANEL
Chol/HDL Ratio: 3.5 ratio (ref 0.0–5.0)
Cholesterol, Total: 195 mg/dL (ref 100–199)
HDL: 56 mg/dL (ref >39)
LDL Chol Calc (NIH): 129 mg/dL — ABNORMAL HIGH (ref 0–99)
Triglycerides: 56 mg/dL (ref 0–149)
VLDL Cholesterol Cal: 10 mg/dL (ref 5–40)

## 2021-08-04 LAB — PSA: Prostate Specific Ag, Serum: 0.8 ng/mL (ref 0.0–4.0)

## 2021-08-05 ENCOUNTER — Other Ambulatory Visit: Payer: Self-pay | Admitting: Internal Medicine

## 2021-08-05 ENCOUNTER — Telehealth: Payer: Self-pay | Admitting: Internal Medicine

## 2021-08-05 ENCOUNTER — Other Ambulatory Visit: Payer: Self-pay

## 2021-08-05 DIAGNOSIS — N1831 Chronic kidney disease, stage 3a: Secondary | ICD-10-CM

## 2021-08-05 DIAGNOSIS — E7800 Pure hypercholesterolemia, unspecified: Secondary | ICD-10-CM

## 2021-08-05 DIAGNOSIS — E785 Hyperlipidemia, unspecified: Secondary | ICD-10-CM | POA: Insufficient documentation

## 2021-08-05 DIAGNOSIS — N183 Chronic kidney disease, stage 3 unspecified: Secondary | ICD-10-CM | POA: Insufficient documentation

## 2021-08-05 MED ORDER — ATORVASTATIN CALCIUM 10 MG PO TABS
10.0000 mg | ORAL_TABLET | Freq: Every day | ORAL | 3 refills | Status: DC
  Filled 2021-08-05: qty 30, 30d supply, fill #0

## 2021-08-05 NOTE — Telephone Encounter (Signed)
Phone call placed to patient today to go over lab results.  I left a voicemail message informing him that I was calling to go over lab results and requesting that he call us back.  He can speak with my medical assistant to get his lab results.

## 2021-08-05 NOTE — Progress Notes (Signed)
Let patient know that he has a mild but stable anemia. Kidney function is not 100% but stable compared to when it was last checked in September 2021.  Avoid taking over-the-counter pain medications like ibuprofen, Aleve, Advil, Motrin as these can make kidney function worse. Liver function test normal. LDL cholesterol is elevated at 129 with goal being less than 100.  High cholesterol can increase risk for heart attack and strokes.  I recommend starting low-dose of a medication called atorvastatin to help lower the cholesterol.  Prescription sent to his pharmacy. PSA which is a screening test for prostate cancer is normal.  The rest of this is for my information. The 10-year ASCVD risk score (Arnett DK, et al., 2019) is: 33.4%   Values used to calculate the score:     Age: 61 years     Sex: Male     Is Non-Hispanic African American: Yes     Diabetic: No     Tobacco smoker: Yes     Systolic Blood Pressure: 244 mmHg     Is BP treated: Yes     HDL Cholesterol: 56 mg/dL     Total Cholesterol: 195 mg/dL

## 2021-08-06 ENCOUNTER — Telehealth: Payer: Self-pay | Admitting: *Deleted

## 2021-08-06 NOTE — Telephone Encounter (Signed)
Left message on voicemail to return call.

## 2021-08-06 NOTE — Telephone Encounter (Signed)
Copied from [place] [id number]. Topic: General - Other >> Aug 05, 2021 10:34 AM Tessa Lerner A wrote: Reason for CRM: Patient has returned missed phone call from their PCP about their lab results Please contact further when available

## 2021-08-15 ENCOUNTER — Encounter (HOSPITAL_COMMUNITY): Payer: Self-pay | Admitting: Emergency Medicine

## 2021-08-15 ENCOUNTER — Emergency Department (HOSPITAL_COMMUNITY)
Admission: EM | Admit: 2021-08-15 | Discharge: 2021-08-16 | Disposition: A | Discharge status: Home / Self Care | Payer: Medicare Other | Attending: Emergency Medicine | Admitting: Emergency Medicine

## 2021-08-15 ENCOUNTER — Other Ambulatory Visit: Payer: Self-pay

## 2021-08-15 DIAGNOSIS — R21 Rash and other nonspecific skin eruption: Secondary | ICD-10-CM | POA: Insufficient documentation

## 2021-08-15 NOTE — ED Triage Notes (Signed)
Pt c/o rash/skin irritation to both cheeks. No known allergies, states he has been using eczema soap and antibiotic ointment with no change.

## 2021-08-16 DIAGNOSIS — R21 Rash and other nonspecific skin eruption: Secondary | ICD-10-CM | POA: Diagnosis not present

## 2021-08-16 MED ORDER — HYDROCORTISONE 1 % EX CREA
TOPICAL_CREAM | Freq: Once | CUTANEOUS | Status: AC
  Administered 2021-08-16: 1 via TOPICAL
  Filled 2021-08-16: qty 28

## 2021-08-16 NOTE — ED Provider Notes (Signed)
[facility] EMERGENCY DEPARTMENT Provider Note   CSN: [id number] Arrival date & time: 08/15/21  1957     History  Chief Complaint  Patient presents with   Rash    Calvin [name] is a 61 y.o. male.  61 year old male who presents to the emergency department today with 2 to 3 weeks of rash to bilateral cheeks and left temple.  Patient states is itchy and dry.  Never had this before.  Has tried some antibacterial ointment and has not helped.  Tried some fungal ointment which did not help.  Has not really been getting worse dusting a little bit no fevers.  No recent illnesses.  No sick contacts.  No travels.   Rash     Home Medications Prior to Admission medications   Medication Sig Start Date End Date Taking? Authorizing Provider  atorvastatin (LIPITOR) 10 MG tablet Take 1 tablet (10 mg total) by mouth daily. 08/05/21   Ladell Pier, MD  cyclobenzaprine (FLEXERIL) 5 MG tablet Take 1 tablet (5 mg total) by mouth 2 (two) times daily as needed for muscle spasms. 06/17/21   Ladell Pier, MD  gabapentin (NEURONTIN) 300 MG capsule Take 1 capsule by mouth 3 (three) times daily. 09/28/20   [provider]  hydrochlorothiazide (MICROZIDE) 12.5 MG capsule Take 1 capsule (12.5 mg total) by mouth daily. 03/09/21   Ladell Pier, MD  melatonin 3 MG TABS tablet Take 1 tablet (3 mg total) by mouth at bedtime. 08/03/21   Ladell Pier, MD  nicotine (NICODERM CQ - DOSED IN MG/24 HR) 7 mg/24hr patch Place 1 patch (7 mg total) onto the skin daily. 11/06/20   Ladell Pier, MD  sertraline (ZOLOFT) 100 MG tablet TAKE 1 TABLET BY MOUTH DAILY. 03/09/21   Ladell Pier, MD  SUBLOCADE 300 MG/1.5ML SOSY Inject into the skin. 07/15/21   [provider]  valsartan (DIOVAN) 40 MG tablet Take 1 tablet (40 mg total) by mouth daily. 08/03/21   Ladell Pier, MD      Allergies    Norvasc [amlodipine]    Review of Systems   Review of Systems  Skin:   Positive for rash.   Physical Exam Updated Vital Signs BP 118/84    Pulse 61    Temp 98.9 F (37.2 C) (Oral)    Resp 16    SpO2 98%  Physical Exam Vitals and nursing note reviewed.  Constitutional:      Appearance: He is well-developed.  HENT:     Head: Normocephalic and atraumatic.     Mouth/Throat:     Mouth: Mucous membranes are moist.     Pharynx: Oropharynx is clear.  Eyes:     Pupils: Pupils are equal, round, and reactive to light.  Cardiovascular:     Rate and Rhythm: Normal rate.  Pulmonary:     Effort: Pulmonary effort is normal. No respiratory distress.  Abdominal:     General: There is no distension.  Musculoskeletal:        General: Normal range of motion.     Cervical back: Normal range of motion.  Skin:    General: Skin is warm and dry.     Findings: Rash (Hyperpigmented dry areas to bilateral cheeks without tenderness, induration or warmth.  No drainage) present.  Neurological:     General: No focal deficit present.     Mental Status: He is alert.    ED Results / Procedures / Treatments  Labs (all labs ordered are listed, but only abnormal results are displayed) Labs Reviewed - No data to display  EKG None  Radiology No results found.  Procedures Procedures    Medications Ordered in ED Medications  hydrocortisone cream 1 % (has no administration in time range)    ED Course/ Medical Decision Making/ A&P                           Medical Decision Making  Appears possibly eczematous.  We will try a course of steroid cream if it is working advised to follow-up with a dermatologist near his home  Final Clinical Impression(s) / ED Diagnoses Final diagnoses:  Rash    Rx / DC Orders ED Discharge Orders     None         Alijah Akram, Corene Cornea, MD 08/16/21 [id number]

## 2021-08-24 DIAGNOSIS — L818 Other specified disorders of pigmentation: Secondary | ICD-10-CM | POA: Diagnosis not present

## 2021-08-28 ENCOUNTER — Telehealth: Payer: Self-pay

## 2021-08-28 NOTE — Telephone Encounter (Signed)
Pt given lab results per notes of Dr. Wynetta Emery on 08/28/21. Pt verbalized understanding.

## 2021-09-01 ENCOUNTER — Encounter: Payer: Commercial Managed Care - HMO | Admitting: Internal Medicine

## 2021-09-05 ENCOUNTER — Encounter: Payer: Self-pay | Admitting: Internal Medicine

## 2021-09-08 ENCOUNTER — Emergency Department (HOSPITAL_COMMUNITY)
Admission: EM | Admit: 2021-09-08 | Discharge: 2021-09-09 | Disposition: A | Discharge status: Home / Self Care | Payer: Medicare Other | Attending: Emergency Medicine | Admitting: Emergency Medicine

## 2021-09-08 ENCOUNTER — Other Ambulatory Visit: Payer: Self-pay

## 2021-09-08 ENCOUNTER — Encounter (HOSPITAL_COMMUNITY): Payer: Self-pay | Admitting: Emergency Medicine

## 2021-09-08 ENCOUNTER — Emergency Department (HOSPITAL_COMMUNITY): Payer: Medicare Other

## 2021-09-08 DIAGNOSIS — Z79899 Other long term (current) drug therapy: Secondary | ICD-10-CM | POA: Diagnosis not present

## 2021-09-08 DIAGNOSIS — T50901A Poisoning by unspecified drugs, medicaments and biological substances, accidental (unintentional), initial encounter: Secondary | ICD-10-CM | POA: Diagnosis not present

## 2021-09-08 DIAGNOSIS — I10 Essential (primary) hypertension: Secondary | ICD-10-CM | POA: Diagnosis not present

## 2021-09-08 DIAGNOSIS — R4182 Altered mental status, unspecified: Secondary | ICD-10-CM | POA: Insufficient documentation

## 2021-09-08 DIAGNOSIS — R531 Weakness: Secondary | ICD-10-CM | POA: Diagnosis not present

## 2021-09-08 DIAGNOSIS — S0990XA Unspecified injury of head, initial encounter: Secondary | ICD-10-CM | POA: Diagnosis present

## 2021-09-08 DIAGNOSIS — X58XXXA Exposure to other specified factors, initial encounter: Secondary | ICD-10-CM | POA: Insufficient documentation

## 2021-09-08 DIAGNOSIS — S0001XA Abrasion of scalp, initial encounter: Secondary | ICD-10-CM | POA: Insufficient documentation

## 2021-09-08 LAB — COMPREHENSIVE METABOLIC PANEL
ALT: 24 U/L (ref 0–44)
AST: 32 U/L (ref 15–41)
Albumin: 4.2 g/dL (ref 3.5–5.0)
Alkaline Phosphatase: 95 U/L (ref 38–126)
Anion gap: 11 (ref 5–15)
BUN: 20 mg/dL (ref 6–20)
CO2: 22 mmol/L (ref 22–32)
Calcium: 8.9 mg/dL (ref 8.9–10.3)
Chloride: 104 mmol/L (ref 98–111)
Creatinine, Ser: 1.3 mg/dL — ABNORMAL HIGH (ref 0.61–1.24)
GFR, Estimated: >60 mL/min (ref >60)
Glucose, Bld: 78 mg/dL (ref 70–99)
Potassium: 4.4 mmol/L (ref 3.5–5.1)
Sodium: 137 mmol/L (ref 135–145)
Total Bilirubin: 0.9 mg/dL (ref 0.3–1.2)
Total Protein: 7.8 g/dL (ref 6.5–8.1)

## 2021-09-08 LAB — URINALYSIS, ROUTINE W REFLEX MICROSCOPIC
Bilirubin Urine: NEGATIVE
Glucose, UA: NEGATIVE mg/dL
Hgb urine dipstick: NEGATIVE
Ketones, ur: 5 mg/dL — AB
Leukocytes,Ua: NEGATIVE
Nitrite: NEGATIVE
Protein, ur: NEGATIVE mg/dL
Specific Gravity, Urine: 1.012 (ref 1.005–1.030)
pH: 5 (ref 5.0–8.0)

## 2021-09-08 LAB — CBC WITH DIFFERENTIAL/PLATELET
Abs Immature Granulocytes: 0.02 10*3/uL (ref 0.00–0.07)
Basophils Absolute: 0.1 10*3/uL (ref 0.0–0.1)
Basophils Relative: 1 %
Eosinophils Absolute: 0.2 10*3/uL (ref 0.0–0.5)
Eosinophils Relative: 2 %
HCT: 39.1 % (ref 39.0–52.0)
Hemoglobin: 13.1 g/dL (ref 13.0–17.0)
Immature Granulocytes: 0 %
Lymphocytes Relative: 12 %
Lymphs Abs: 1 10*3/uL (ref 0.7–4.0)
MCH: 28.7 pg (ref 26.0–34.0)
MCHC: 33.5 g/dL (ref 30.0–36.0)
MCV: 85.6 fL (ref 80.0–100.0)
Monocytes Absolute: 1 10*3/uL (ref 0.1–1.0)
Monocytes Relative: 12 %
Neutro Abs: 6 10*3/uL (ref 1.7–7.7)
Neutrophils Relative %: 73 %
Platelets: 145 10*3/uL — ABNORMAL LOW (ref 150–400)
RBC: 4.57 MIL/uL (ref 4.22–5.81)
RDW: 15.3 % (ref 11.5–15.5)
WBC: 8.3 10*3/uL (ref 4.0–10.5)
nRBC: 0 % (ref 0.0–0.2)

## 2021-09-08 LAB — RAPID URINE DRUG SCREEN, HOSP PERFORMED
Amphetamines: NOT DETECTED
Barbiturates: NOT DETECTED
Benzodiazepines: NOT DETECTED
Cocaine: POSITIVE — AB
Opiates: NOT DETECTED
Tetrahydrocannabinol: POSITIVE — AB

## 2021-09-08 LAB — CBG MONITORING, ED
Glucose-Capillary: 69 mg/dL — ABNORMAL LOW (ref 70–99)
Glucose-Capillary: 78 mg/dL (ref 70–99)

## 2021-09-08 LAB — ETHANOL: Alcohol, Ethyl (B): <10 mg/dL (ref <10)

## 2021-09-08 LAB — TROPONIN I (HIGH SENSITIVITY)
Troponin I (High Sensitivity): 7 ng/L (ref <18)
Troponin I (High Sensitivity): 8 ng/L (ref <18)

## 2021-09-08 MED ORDER — ONDANSETRON HCL 4 MG/2ML IJ SOLN: 4.0000 mg | Freq: Once | INTRAMUSCULAR | Status: DC

## 2021-09-08 MED ORDER — ONDANSETRON 4 MG PO TBDP
4.0000 mg | ORAL_TABLET | Freq: Once | ORAL | Status: AC
  Administered 2021-09-08: 4 mg via ORAL
  Filled 2021-09-08: qty 1

## 2021-09-08 NOTE — ED Provider Triage Note (Signed)
Emergency Medicine Provider Triage Evaluation Note  Calvin [name] , a 61 y.o. male  was evaluated in triage.  Pt complains of possible altered mental status.  Patient [facility] of the [facility] unconscious, he he just states he fell walking out of a car.  He is here visiting a family member, his sister had a heart concern.  He denies any his head, illicit drug use, alcohol use, any pain anywhere.  He is oriented x3..  Review of Systems  Positive: Also consciousness, possible altered mental status Negative: ABOVE  Physical Exam  BP 107/74    Pulse 82    Temp 98.3 F (36.8 C)    Resp 16    SpO2 95%  Gen:   Awake, no distress   Resp:  Normal effort  MSK:   Moves extremities without difficulty  Other:  Patient follows commands and answers questions.  He is oriented x3.  There is no tenderness on palpation anywhere, when not being addressed he stares off into space.  Medical Decision Making  Medically screening exam initiated at 7:07 PM.  Appropriate orders placed.  Calvin [name] was informed that the remainder of the evaluation will be completed by another provider, this initial triage assessment does not replace that evaluation, and the importance of remaining in the ED until their evaluation is complete.  Broad work-up, patient is an unreliable historian.   Sherrill Raring, PA-C 09/08/21 1909

## 2021-09-08 NOTE — ED Triage Notes (Signed)
Patient was brought over by another nurse that found [facility]. Patient was confused and mumbling to staff. When RN walking into triage patient states his friend dropped him off to visit his sister. Patient states he must have passed out but does not remember anything. Patient able to state his name, location and year. Denies any alcohol or drug use. Patient twitching around during triage.

## 2021-09-08 NOTE — ED Provider Notes (Signed)
[facility] EMERGENCY DEPARTMENT Provider Note   CSN: [id number] Arrival date & time: 09/08/21  1854     History  Chief Complaint  Patient presents with   Altered Mental Status    Rickardo Brinegar is a 61 y.o. male.   Altered Mental Status Patient brought in for mental status change.  Reportedly confused outside [facility].  Reportedly had been unresponsive.  Patient states he was dropped off.  Thinks he may have taken too much of his medicine.  He is on injection Suboxone.  States he is due for new injection soon.  States he also took some the extra Suboxone oral that he had.  Denies injury but really does not know what happened.  Answers questions but still not back at baseline.   Past Medical History:  Diagnosis Date   Anxiety    Arthritis    back, shoulders    Constipation    Depression    Disc disorder    Hypertension    Lumbar herniated disc    Muscle spasm    Past Surgical History:  Procedure Laterality Date   ANTERIOR CERVICAL DECOMP/DISCECTOMY FUSION N/A 03/08/2018   Procedure: ANTERIOR CERVICAL DECOMPRESSION/DISCECTOMY FUSION , cervical 3-4, cervical 4-5, cervical 5-6 with intrumentational and allograft;  Surgeon: Phylliss Bob, MD;  Location: [facility];  Service: Orthopedics;  Laterality: N/A;   BONE BIOPSY Right 05/17/2018   Procedure: BONE BIOPSY X2;  Surgeon: Evelina Bucy, DPM;  Location: [facility];  Service: Podiatry;  Laterality: Right;  right second toe   COLONOSCOPY     FOOT SURGERY     right, ~ 2002   POLYPECTOMY       Home Medications Prior to Admission medications   Medication Sig Start Date End Date Taking? Authorizing Provider  atorvastatin (LIPITOR) 10 MG tablet Take 1 tablet (10 mg total) by mouth daily. 08/05/21   Ladell Pier, MD  cyclobenzaprine (FLEXERIL) 5 MG tablet Take 1 tablet (5 mg total) by mouth 2 (two) times daily as needed for muscle spasms. 06/17/21   Ladell Pier, MD  gabapentin  (NEURONTIN) 300 MG capsule Take 1 capsule by mouth 3 (three) times daily. 09/28/20   [provider]  hydrochlorothiazide (MICROZIDE) 12.5 MG capsule Take 1 capsule (12.5 mg total) by mouth daily. 03/09/21   Ladell Pier, MD  melatonin 3 MG TABS tablet Take 1 tablet (3 mg total) by mouth at bedtime. 08/03/21   Ladell Pier, MD  nicotine (NICODERM CQ - DOSED IN MG/24 HR) 7 mg/24hr patch Place 1 patch (7 mg total) onto the skin daily. 11/06/20   Ladell Pier, MD  sertraline (ZOLOFT) 100 MG tablet TAKE 1 TABLET BY MOUTH DAILY. 03/09/21   Ladell Pier, MD  SUBLOCADE 300 MG/1.5ML SOSY Inject into the skin. 07/15/21   [provider]  valsartan (DIOVAN) 40 MG tablet Take 1 tablet (40 mg total) by mouth daily. 08/03/21   Ladell Pier, MD      Allergies    Norvasc [amlodipine]    Review of Systems   Review of Systems  Unable to perform ROS: Mental status change   Physical Exam Updated Vital Signs BP 128/72    Pulse 84    Temp 98.3 F (36.8 C)    Resp 14    SpO2 95%  Physical Exam Vitals and nursing note reviewed.  HENT:     Head:     Comments: Linear abrasion right occipital area  Eyes:     Pupils: Pupils are equal, round, and reactive to light.  Cardiovascular:     Rate and Rhythm: Regular rhythm.  Pulmonary:     Effort: Pulmonary effort is normal.  Abdominal:     Tenderness: There is no abdominal tenderness.  Musculoskeletal:        General: No tenderness.  Skin:    General: Skin is warm.  Neurological:     Mental Status: He is alert.     Comments: Slow to answer but otherwise awake and appropriate.    ED Results / Procedures / Treatments   Labs (all labs ordered are listed, but only abnormal results are displayed) Labs Reviewed  RAPID URINE DRUG SCREEN, HOSP PERFORMED - Abnormal; Notable for the following components:      Result Value   Cocaine POSITIVE (*)    Tetrahydrocannabinol POSITIVE (*)    All other components within normal  limits  URINALYSIS, ROUTINE W REFLEX MICROSCOPIC - Abnormal; Notable for the following components:   Ketones, ur 5 (*)    All other components within normal limits  CBC WITH DIFFERENTIAL/PLATELET - Abnormal; Notable for the following components:   Platelets 145 (*)    All other components within normal limits  COMPREHENSIVE METABOLIC PANEL - Abnormal; Notable for the following components:   Creatinine, Ser 1.30 (*)    All other components within normal limits  CBG MONITORING, ED - Abnormal; Notable for the following components:   Glucose-Capillary 69 (*)    All other components within normal limits  ETHANOL  CBG MONITORING, ED  TROPONIN I (HIGH SENSITIVITY)  TROPONIN I (HIGH SENSITIVITY)    EKG EKG Interpretation  Date/Time:  Tuesday September 08 2021 19:07:24 EST Ventricular Rate:  80 PR Interval:  174 QRS Duration: 92 QT Interval:  364 QTC Calculation: 419 R Axis:   -35 Text Interpretation: Normal sinus rhythm Left axis deviation Abnormal ECG When compared with ECG of 06-Aug-2019 18:49, PREVIOUS ECG IS PRESENT Confirmed by Davonna Belling [phone]) on 09/08/2021 11:15:14 PM  Radiology DG Chest 2 View  Result Date: 09/08/2021 CLINICAL DATA:  Weakness EXAM: CHEST - 2 VIEW COMPARISON:  08/09/2019, CT 08/07/2019 FINDINGS: Decreased size of previously noted cavitary lesion in the right upper lobe since prior radiograph. Probable pleural and parenchymal scarring at the right apex with volume loss and elevation of right diaphragm. Normal cardiac size. No pneumothorax IMPRESSION: Overall decreased size of previously noted cavitary mass/lesion in the right upper lobe with small residual cystic lucency and mild distortion suggestive of pleural and parenchymal scarring Electronically Signed   By: Donavan Foil M.D.   On: 09/08/2021 20:31   CT Head Wo Contrast  Result Date: 09/08/2021 CLINICAL DATA:  Mental status change EXAM: CT HEAD WITHOUT CONTRAST TECHNIQUE: Contiguous axial images were  obtained from the base of the skull through the vertex without intravenous contrast. RADIATION DOSE REDUCTION: This exam was performed according to the departmental dose-optimization program which includes automated exposure control, adjustment of the mA and/or kV according to patient size and/or use of iterative reconstruction technique. COMPARISON:  CT brain 10/07/2015 FINDINGS: Brain: No acute territorial infarction, hemorrhage or intracranial mass. The ventricles are nonenlarged. Vascular: No hyperdense vessels.  No unexpected calcification Skull: Normal. Negative for fracture or focal lesion. Sinuses/Orbits: Remote fracture medial wall right orbit. Other: None IMPRESSION: Negative non contrasted CT appearance of the brain Electronically Signed   By: Donavan Foil M.D.   On: 09/08/2021 20:18    Procedures Procedures  Medications Ordered in ED Medications  ondansetron (ZOFRAN-ODT) disintegrating tablet 4 mg (4 mg Oral Given 09/08/21 2351)    ED Course/ Medical Decision Making/ A&P                           Medical Decision Making Amount and/or Complexity of Data Reviewed External Data Reviewed: notes. Labs: ordered. Decision-making details documented in ED Course. Radiology: independent interpretation performed. Decision-making details documented in ED Course. ECG/medicine tests: independent interpretation performed. Decision-making details documented in ED Course.  Risk Prescription drug management. Decision regarding hospitalization.  Patient brought in with mental status change.  Initial differential diagnosis is long. Patient states he was feeling off.  Was dumped off in a car to visit someone.  Reportedly found unresponsive.  States he has a Suboxone chronic injection and took some extra Suboxone orally.  Denies other drug use but urine drug screen did show cocaine.  States he does not know what that would show up.  No chest pain.  No trouble breathing.  Feeling much better but now  is vomiting.  Given some Zofran.  Head CT independently interpreted and reassuring.  Lab work reassuring.  Marijuana which showed up and drug screen.  If feeling better should be able to discharge home.  Doubt cardiac cause of the mental status change.  Doubt seizure.  Care turned over to Dr. Ralene Bathe.         Final Clinical Impression(s) / ED Diagnoses Final diagnoses:  Accidental overdose, initial encounter  Altered mental status, unspecified altered mental status type    Rx / DC Orders ED Discharge Orders     None         Davonna Belling, MD 09/09/21 0009

## 2021-09-09 NOTE — ED Provider Notes (Signed)
Patient care assumed pending p.o. challenge.  Patient here for evaluation of mental status change, found confused.  He is on Suboxone and to take extra medication.  He did have an episode of emesis, plan for p.o. challenge and if he passes may DC home.  Patient able to tolerate orals without difficulty.  States he feels fatigued but no additional complaints.  Plan to discharge home, lives with brother.  Return precautions discussed.   [name], MD 09/09/21 [phone]

## 2021-09-09 NOTE — ED Notes (Signed)
PO challenge passed 

## 2021-09-09 NOTE — ED Notes (Signed)
RN provided the phone numbers for multiple taxi companies for pt to arrange a cab ride home. Pt plans to use lobby phone.

## 2021-09-09 NOTE — ED Notes (Signed)
E-signature pad unavailable at time of pt discharge. This RN discussed discharge materials with pt and answered all pt questions. Pt stated understanding of discharge material. ? ?

## 2021-09-09 NOTE — ED Notes (Signed)
Pt ambulated self around room with his cane without requiring assistance. Stated to this RN that he does not feel dizzy, weak, or SOB while standing or ambulating self. States that he feels alright to leave emergency department now.

## 2021-09-14 DIAGNOSIS — M1612 Unilateral primary osteoarthritis, left hip: Secondary | ICD-10-CM | POA: Diagnosis not present

## 2021-09-14 DIAGNOSIS — M25552 Pain in left hip: Secondary | ICD-10-CM | POA: Diagnosis not present

## 2021-09-16 ENCOUNTER — Telehealth: Payer: Self-pay | Admitting: Internal Medicine

## 2021-09-16 DIAGNOSIS — R768 Other specified abnormal immunological findings in serum: Secondary | ICD-10-CM

## 2021-09-16 NOTE — Telephone Encounter (Signed)
Positive hepatitis C antibody Phone call placed to patient today.  I left a message on his voicemail informing him that I received the hepatitis C screening test that was done recently by his insurance on home visit 09/05/2021.  Informed him that the screening test was positive.  Request that he come to the lab to have additional testing done to see if this is a true positive.  I also need to know whether he was diagnosed with hepatitis C in the past and if so, was he treated.

## 2021-09-16 NOTE — Telephone Encounter (Signed)
Patient called back and states he will wait until his physical appointment scheduled for 09/24/2021 to have the below mentioned labs done.

## 2021-09-17 ENCOUNTER — Encounter: Payer: Self-pay | Admitting: Internal Medicine

## 2021-09-17 DIAGNOSIS — B182 Chronic viral hepatitis C: Secondary | ICD-10-CM | POA: Insufficient documentation

## 2021-09-17 NOTE — Telephone Encounter (Signed)
Returned pt call. Pt states he has been dx with Hep C in the past and has been treated and cured. Pt states he was treated in [facility].

## 2021-09-19 ENCOUNTER — Encounter (HOSPITAL_COMMUNITY): Payer: Self-pay

## 2021-09-19 ENCOUNTER — Emergency Department (HOSPITAL_COMMUNITY)
Admission: EM | Admit: 2021-09-19 | Discharge: 2021-09-19 | Disposition: A | Discharge status: Home / Self Care | Payer: Medicare Other | Attending: Emergency Medicine | Admitting: Emergency Medicine

## 2021-09-19 ENCOUNTER — Emergency Department (HOSPITAL_COMMUNITY): Payer: Medicare Other

## 2021-09-19 DIAGNOSIS — E871 Hypo-osmolality and hyponatremia: Secondary | ICD-10-CM | POA: Insufficient documentation

## 2021-09-19 DIAGNOSIS — R112 Nausea with vomiting, unspecified: Secondary | ICD-10-CM | POA: Diagnosis not present

## 2021-09-19 DIAGNOSIS — R197 Diarrhea, unspecified: Secondary | ICD-10-CM | POA: Diagnosis not present

## 2021-09-19 DIAGNOSIS — R1084 Generalized abdominal pain: Secondary | ICD-10-CM

## 2021-09-19 DIAGNOSIS — Z20822 Contact with and (suspected) exposure to covid-19: Secondary | ICD-10-CM | POA: Diagnosis not present

## 2021-09-19 DIAGNOSIS — Z79899 Other long term (current) drug therapy: Secondary | ICD-10-CM | POA: Diagnosis not present

## 2021-09-19 DIAGNOSIS — R1013 Epigastric pain: Secondary | ICD-10-CM | POA: Insufficient documentation

## 2021-09-19 DIAGNOSIS — I10 Essential (primary) hypertension: Secondary | ICD-10-CM | POA: Diagnosis not present

## 2021-09-19 DIAGNOSIS — R1110 Vomiting, unspecified: Secondary | ICD-10-CM | POA: Diagnosis not present

## 2021-09-19 DIAGNOSIS — R109 Unspecified abdominal pain: Secondary | ICD-10-CM | POA: Diagnosis not present

## 2021-09-19 LAB — CBC
HCT: 38.4 % — ABNORMAL LOW (ref 39.0–52.0)
Hemoglobin: 12.9 g/dL — ABNORMAL LOW (ref 13.0–17.0)
MCH: 28.5 pg (ref 26.0–34.0)
MCHC: 33.6 g/dL (ref 30.0–36.0)
MCV: 85 fL (ref 80.0–100.0)
Platelets: 186 10*3/uL (ref 150–400)
RBC: 4.52 MIL/uL (ref 4.22–5.81)
RDW: 14.8 % (ref 11.5–15.5)
WBC: 6.5 10*3/uL (ref 4.0–10.5)
nRBC: 0 % (ref 0.0–0.2)

## 2021-09-19 LAB — COMPREHENSIVE METABOLIC PANEL
ALT: 17 U/L (ref 0–44)
AST: 26 U/L (ref 15–41)
Albumin: 3.7 g/dL (ref 3.5–5.0)
Alkaline Phosphatase: 78 U/L (ref 38–126)
Anion gap: 10 (ref 5–15)
BUN: 22 mg/dL — ABNORMAL HIGH (ref 6–20)
CO2: 23 mmol/L (ref 22–32)
Calcium: 8.5 mg/dL — ABNORMAL LOW (ref 8.9–10.3)
Chloride: 101 mmol/L (ref 98–111)
Creatinine, Ser: 1.59 mg/dL — ABNORMAL HIGH (ref 0.61–1.24)
GFR, Estimated: 49 mL/min — ABNORMAL LOW (ref >60)
Glucose, Bld: 95 mg/dL (ref 70–99)
Potassium: 4.6 mmol/L (ref 3.5–5.1)
Sodium: 134 mmol/L — ABNORMAL LOW (ref 135–145)
Total Bilirubin: 0.3 mg/dL (ref 0.3–1.2)
Total Protein: 7.4 g/dL (ref 6.5–8.1)

## 2021-09-19 LAB — URINALYSIS, ROUTINE W REFLEX MICROSCOPIC
Bilirubin Urine: NEGATIVE
Glucose, UA: NEGATIVE mg/dL
Hgb urine dipstick: NEGATIVE
Ketones, ur: NEGATIVE mg/dL
Leukocytes,Ua: NEGATIVE
Nitrite: NEGATIVE
Protein, ur: NEGATIVE mg/dL
Specific Gravity, Urine: 1.014 (ref 1.005–1.030)
pH: 5 (ref 5.0–8.0)

## 2021-09-19 LAB — LIPASE, BLOOD: Lipase: 29 U/L (ref 11–51)

## 2021-09-19 LAB — RESP PANEL BY RT-PCR (FLU A&B, COVID) ARPGX2
Influenza A by PCR: NEGATIVE
Influenza B by PCR: NEGATIVE
SARS Coronavirus 2 by RT PCR: NEGATIVE

## 2021-09-19 LAB — OSMOLALITY: Osmolality: 298 mOsm/kg — ABNORMAL HIGH (ref 275–295)

## 2021-09-19 LAB — ETHANOL: Alcohol, Ethyl (B): <10 mg/dL (ref <10)

## 2021-09-19 MED ORDER — ONDANSETRON 4 MG PO TBDP
4.0000 mg | ORAL_TABLET | Freq: Three times a day (TID) | ORAL | 0 refills | Status: DC | PRN
  Filled 2021-09-19: qty 20, 7d supply, fill #0

## 2021-09-19 MED ORDER — ONDANSETRON HCL 4 MG/2ML IJ SOLN: 4.0000 mg | Freq: Once | INTRAMUSCULAR | Status: DC

## 2021-09-19 MED ORDER — MORPHINE SULFATE (PF) 4 MG/ML IV SOLN
4.0000 mg | Freq: Once | INTRAVENOUS | Status: AC
  Administered 2021-09-19: 4 mg via INTRAVENOUS
  Filled 2021-09-19: qty 1

## 2021-09-19 MED ORDER — ONDANSETRON 4 MG PO TBDP
4.0000 mg | ORAL_TABLET | Freq: Once | ORAL | Status: AC | PRN
  Administered 2021-09-19: 4 mg via ORAL
  Filled 2021-09-19: qty 1

## 2021-09-19 MED ORDER — SODIUM CHLORIDE 0.9 % IV BOLUS
1000.0000 mL | Freq: Once | INTRAVENOUS | Status: AC
  Administered 2021-09-19: 1000 mL via INTRAVENOUS

## 2021-09-19 MED ORDER — IOHEXOL 300 MG/ML  SOLN
100.0000 mL | Freq: Once | INTRAMUSCULAR | Status: AC | PRN
Start: 2021-09-19 — End: 2021-09-19
  Administered 2021-09-19: 100 mL via INTRAVENOUS

## 2021-09-19 NOTE — Discharge Instructions (Addendum)
Your lab work and CT scan was reassuring at this time.  There is no indication that you have been poisoned with antifreeze.  Please follow-up with your primary care physician for further evaluation.  Pick up prescription to take as needed for nausea.  Drink plenty of fluids to stay hydrated.  Return to the ED for any new/worsening symptoms.

## 2021-09-19 NOTE — ED Provider Notes (Signed)
[facility] EMERGENCY DEPARTMENT Provider Note   CSN: [id number] Arrival date & time: 09/19/21  1518     History  Chief Complaint  Patient presents with   Abdominal Pain   Nausea   Emesis    Calvin [name] is a 61 y.o. male with PMHx HTN who presents to the ED today with complaint of gradual onset, constant, dull/achy, epigastric abdominal pain that began 3 days ago.  Patient also complains of nausea, nonbloody nonbilious emesis, looser stools, chills.  He denies any recent sick contacts.  He denies any suspicious food intake and states that he cooks all of his own meals.  He states that he takes 2 BC powders daily for hip pain.  He denies taking any additional NSAIDs.  Denies any heavy EtOH use.  Denies any recent antibiotics.  Denies fevers, chest pain, shortness of breath, melena, urinary symptoms, testicular pain.  No previous abdominal surgeries.  The history is provided by the patient and medical records.      Home Medications Prior to Admission medications   Medication Sig Start Date End Date Taking? Authorizing Provider  ondansetron (ZOFRAN-ODT) 4 MG disintegrating tablet Take 1 tablet (4 mg total) by mouth every 8 (eight) hours as needed for nausea or vomiting. 09/19/21  Yes Alroy Bailiff, Amadeus Oyama, PA-C  atorvastatin (LIPITOR) 10 MG tablet Take 1 tablet (10 mg total) by mouth daily. 08/05/21   Ladell Pier, MD  cyclobenzaprine (FLEXERIL) 5 MG tablet Take 1 tablet (5 mg total) by mouth 2 (two) times daily as needed for muscle spasms. 06/17/21   Ladell Pier, MD  gabapentin (NEURONTIN) 300 MG capsule Take 1 capsule by mouth 3 (three) times daily. 09/28/20   [provider]  hydrochlorothiazide (MICROZIDE) 12.5 MG capsule Take 1 capsule (12.5 mg total) by mouth daily. 03/09/21   Ladell Pier, MD  melatonin 3 MG TABS tablet Take 1 tablet (3 mg total) by mouth at bedtime. 08/03/21   Ladell Pier, MD  nicotine (NICODERM CQ - DOSED IN MG/24 HR) 7  mg/24hr patch Place 1 patch (7 mg total) onto the skin daily. 11/06/20   Ladell Pier, MD  sertraline (ZOLOFT) 100 MG tablet TAKE 1 TABLET BY MOUTH DAILY. 03/09/21   Ladell Pier, MD  SUBLOCADE 300 MG/1.5ML SOSY Inject into the skin. 07/15/21   [provider]  valsartan (DIOVAN) 40 MG tablet Take 1 tablet (40 mg total) by mouth daily. 08/03/21   Ladell Pier, MD      Allergies    Norvasc [amlodipine]    Review of Systems   Review of Systems  Constitutional:  Positive for chills. Negative for fever.  Respiratory:  Negative for cough and shortness of breath.   Cardiovascular:  Negative for chest pain.  Gastrointestinal:  Positive for abdominal pain, diarrhea, nausea and vomiting. Negative for constipation.  Genitourinary:  Negative for dysuria, flank pain, testicular pain and urgency.  All other systems reviewed and are negative.  Physical Exam Updated Vital Signs BP 99/67    Pulse 66    Temp 98.7 F (37.1 C) (Oral)    Resp 18    SpO2 96%  Physical Exam Vitals and nursing note reviewed.  Constitutional:      Appearance: He is not ill-appearing or diaphoretic.  HENT:     Head: Normocephalic and atraumatic.  Eyes:     Conjunctiva/sclera: Conjunctivae normal.  Cardiovascular:     Rate and Rhythm: Normal rate and regular rhythm.  Heart sounds: Normal heart sounds.  Pulmonary:     Effort: Pulmonary effort is normal.     Breath sounds: Normal breath sounds. No wheezing, rhonchi or rales.  Abdominal:     General: Bowel sounds are normal.     Palpations: Abdomen is soft.     Tenderness: There is abdominal tenderness in the epigastric area and periumbilical area. There is no guarding. Negative signs include McBurney's sign.  Musculoskeletal:     Cervical back: Neck supple.  Skin:    General: Skin is warm and dry.  Neurological:     Mental Status: He is alert.    ED Results / Procedures / Treatments   Labs (all labs ordered are listed, but only  abnormal results are displayed) Labs Reviewed  COMPREHENSIVE METABOLIC PANEL - Abnormal; Notable for the following components:      Result Value   Sodium 134 (*)    BUN 22 (*)    Creatinine, Ser 1.59 (*)    Calcium 8.5 (*)    GFR, Estimated 49 (*)    All other components within normal limits  CBC - Abnormal; Notable for the following components:   Hemoglobin 12.9 (*)    HCT 38.4 (*)    All other components within normal limits  OSMOLALITY - Abnormal; Notable for the following components:   Osmolality 298 (*)    All other components within normal limits  RESP PANEL BY RT-PCR (FLU A&B, COVID) ARPGX2  LIPASE, BLOOD  URINALYSIS, ROUTINE W REFLEX MICROSCOPIC  ETHANOL    EKG None  Radiology CT Abdomen Pelvis W Contrast  Result Date: 09/19/2021 CLINICAL DATA:  Abdominal pain for several days. Nausea and vomiting. EXAM: CT ABDOMEN AND PELVIS WITH CONTRAST TECHNIQUE: Multidetector CT imaging of the abdomen and pelvis was performed using the standard protocol following bolus administration of intravenous contrast. RADIATION DOSE REDUCTION: This exam was performed according to the departmental dose-optimization program which includes automated exposure control, adjustment of the mA and/or kV according to patient size and/or use of iterative reconstruction technique. CONTRAST:  123mL OMNIPAQUE IOHEXOL 300 MG/ML  SOLN COMPARISON:  None. FINDINGS: Lower Chest: No acute findings. Hepatobiliary: No hepatic masses identified. Gallbladder is unremarkable. No evidence of biliary ductal dilatation. Pancreas:  No mass or inflammatory changes. Spleen: Within normal limits in size and appearance. Adrenals/Urinary Tract: No masses identified. Small cyst noted in lower pole of right kidney. No evidence of ureteral calculi or hydronephrosis. Stomach/Bowel: No evidence of obstruction, inflammatory process or abnormal fluid collections. Normal appendix visualized. Vascular/Lymphatic: No pathologically enlarged  lymph nodes. No acute vascular findings. Aortic atherosclerotic calcification noted. Reproductive:  No mass or other significant abnormality. Other:  None. Musculoskeletal:  No suspicious bone lesions identified. IMPRESSION: No acute findings or other significant abnormality. Aortic Atherosclerosis (ICD10-I70.0). Electronically Signed   By: Marlaine Hind M.D.   On: 09/19/2021 17:52    Procedures Procedures    Medications Ordered in ED Medications  ondansetron (ZOFRAN-ODT) disintegrating tablet 4 mg (4 mg Oral Given 09/19/21 1532)  sodium chloride 0.9 % bolus 1,000 mL (0 mLs Intravenous Stopped 09/19/21 1919)  morphine (PF) 4 MG/ML injection 4 mg (4 mg Intravenous Given 09/19/21 1647)  iohexol (OMNIPAQUE) 300 MG/ML solution 100 mL (100 mLs Intravenous Contrast Given 09/19/21 1730)    ED Course/ Medical Decision Making/ A&P                           Medical Decision Making 61 year old male  who presents to the ED today with complaint of epigastric pain, nausea, vomiting, looser stools, chills x3 days.  On arrival to the ED patient is afebrile, nontachycardic and nontachypneic and appears to be in no acute distress.  He is noted to have mild epigastric abdominal tenderness palpation as well as periumbilical tenderness palpation without rebound or guarding.  No right lower quadrant abdominal tenderness palpation.  Labs were collected prior to being seen which are currently in process including CBC, CMP, lipase, UA.  She denies history of similar issues in the past.  Given location of pain most suspicious for viral gastroenteritis however differential diagnosis includes gallbladder etiology, pancreatitis, peptic ulcer disease, GERD, as well as possibility for appendicitis given periumbilical tenderness palpation on exam.  We will plan for reevaluation after fluids, antiemetics, pain medication as well as pending lab work at this time.  Given age may consider CT abdomen and pelvis for further evaluation if any  abnormality on labs including leukocytosis.  May consider right upper quadrant ultrasound if LFTs elevated.   6:09 PM Nursing staff received call from poison control asking about updates regarding patient.  She and myself were unaware that poison control was even contacted.  They report that patient called himself concern for possible antifreeze poisoning from his neighbors.  Had lengthy discussion with patient, he states that his dog passed away 2 days ago and was having vomiting.  He states because he is having similar symptoms he is concerned that he has been poisoned and that his dog was also poisoned.  He states he is unsure if his neighbors were putting antifreeze in his yard. Pt denies eating anything out of his yard or specifically drinking anything green. He then reports he and his neighbors "share everything" and that he knows it is very easy to drink antifreeze without realizing. Will contact poison control at this time. Pt otherwise does not show any paranoid thought process and no hx of psychiatric illness. He states he "just wants to rule everything out."   Patient will work-up for potential poisoning negative at this time.  Poison control has signed off.  She has been able to tolerate p.o. here without difficulty.  I have very low suspicion for acute poisoning today.  His CT scan is negative and labs are otherwise reassuring.  Suspect viral gastroenteritis.  Patient is stable for discharge home at this time.   Amount and/or Complexity of Data Reviewed Labs: ordered.    Details: CBC without leukocytosis. Hgb stable compared to previous at 12.9 CMP with creatinine 1.59; only slightly increased form previous with BUN 22. Receiving fluids at this time. Sodium 134 and calcium 8.5. No other electrolyte abnormalities.  Lipase WNL at 29 U/A without signs of infection  Etoh negative Serum osmolality slightly elevated at 298 Radiology: ordered.    Details: CT A/P without acute  findings. Discussion of management or test interpretation with external provider(s): Discussed case with poison control who would like additional of serum osmolality and EtOH. They had initially recommended a repeat BMP however with symptoms lasting for 3 days I question if this is necessary? Waiting on call back at this time.   Received call from poison control who spoke with their Toxicologist; no need for repeat BMP at this time.   Rediscussed case with poison control after EtOH and serum osmolality has returned.  Osmolality slightly elevated however given his symptoms started approximately 2 to 3 days ago and patient without anion gap acidosis they are nonsuspicious for  antifreeze poisoning at this time.  He has been cleared from a poison control standpoint.  Risk Prescription drug management.           Final Clinical Impression(s) / ED Diagnoses Final diagnoses:  Generalized abdominal pain  Nausea vomiting and diarrhea    Rx / DC Orders ED Discharge Orders          Ordered    ondansetron (ZOFRAN-ODT) 4 MG disintegrating tablet  Every 8 hours PRN        09/19/21 2240             Discharge Instructions      Your lab work and CT scan was reassuring at this time.  There is no indication that you have been poisoned with antifreeze.  Please follow-up with your primary care physician for further evaluation.  Pick up prescription to take as needed for nausea.  Drink plenty of fluids to stay hydrated.  Return to the ED for any new/worsening symptoms.       Eustaquio Maize, PA-C 09/19/21 2242    Luna Fuse, MD 09/20/21 1949

## 2021-09-19 NOTE — ED Triage Notes (Signed)
Pt c/o epigastric abd pain x "a couple days," endorses associated nausea/emesis x2, bile.  Denies diarrhea, SHOB, CP, sick contact

## 2021-09-21 ENCOUNTER — Telehealth: Payer: Self-pay

## 2021-09-21 ENCOUNTER — Other Ambulatory Visit: Payer: Self-pay

## 2021-09-21 DIAGNOSIS — Z79891 Long term (current) use of opiate analgesic: Secondary | ICD-10-CM | POA: Diagnosis not present

## 2021-09-21 NOTE — Telephone Encounter (Signed)
[place] [facility] calling to verify PCP received pt.'s Hep C screening. Instructed PCP is aware of results.

## 2021-09-23 ENCOUNTER — Other Ambulatory Visit: Payer: Self-pay | Admitting: Internal Medicine

## 2021-09-23 ENCOUNTER — Other Ambulatory Visit: Payer: Self-pay

## 2021-09-23 DIAGNOSIS — R252 Cramp and spasm: Secondary | ICD-10-CM

## 2021-09-23 MED ORDER — CYCLOBENZAPRINE HCL 5 MG PO TABS
5.0000 mg | ORAL_TABLET | Freq: Two times a day (BID) | ORAL | 0 refills | Status: DC | PRN
  Filled 2021-09-23: qty 30, 15d supply, fill #0

## 2021-09-23 NOTE — Telephone Encounter (Signed)
Requested medications are due for refill today.  yes ? ?Requested medications are on the active medications list.  yes ? ?Last refill. 06/17/2021 #30 1 refill ? ?Future visit scheduled.   yes ? ?Notes to clinic.  Medication not delegated. ? ? ? ?Requested Prescriptions  ?Pending Prescriptions Disp Refills  ? cyclobenzaprine (FLEXERIL) 5 MG tablet 30 tablet 1  ?  Sig: Take 1 tablet (5 mg total) by mouth 2 (two) times daily as needed for muscle spasms.  ?  ? Not Delegated - Analgesics:  Muscle Relaxants Failed - 09/23/2021  4:05 PM  ?  ?  Failed - This refill cannot be delegated  ?  ?  Passed - Valid encounter within last 6 months  ?  Recent Outpatient Visits   ? ?      ? 1 month ago Essential hypertension  ? [place] Ladell Pier, MD  ? 6 months ago Essential hypertension  ? [place] Ladell Pier, MD  ? 10 months ago Essential hypertension  ? [place] Ladell Pier, MD  ? 1 year ago Essential hypertension  ? [place] Karle Plumber B, MD  ? 2 years ago Leukocytosis, unspecified type  ? [place], [place], [place]  ? ?  ?  ?Future Appointments   ? ?        ? Tomorrow Ladell Pier, MD [place]  ? ?  ? ?  ?  ?  ?  ?

## 2021-09-24 ENCOUNTER — Ambulatory Visit: Payer: Medicare Other | Attending: Internal Medicine | Admitting: Internal Medicine

## 2021-09-24 ENCOUNTER — Other Ambulatory Visit: Payer: Self-pay

## 2021-09-24 ENCOUNTER — Encounter: Payer: Self-pay | Admitting: Internal Medicine

## 2021-09-24 ENCOUNTER — Other Ambulatory Visit (HOSPITAL_COMMUNITY): Payer: Self-pay

## 2021-09-24 VITALS — BP 126/77 | HR 62 | Resp 16 | Ht 73.0 in | Wt 171.0 lb

## 2021-09-24 DIAGNOSIS — I10 Essential (primary) hypertension: Secondary | ICD-10-CM

## 2021-09-24 DIAGNOSIS — Z8619 Personal history of other infectious and parasitic diseases: Secondary | ICD-10-CM

## 2021-09-24 DIAGNOSIS — F17210 Nicotine dependence, cigarettes, uncomplicated: Secondary | ICD-10-CM

## 2021-09-24 DIAGNOSIS — F17200 Nicotine dependence, unspecified, uncomplicated: Secondary | ICD-10-CM

## 2021-09-24 DIAGNOSIS — R269 Unspecified abnormalities of gait and mobility: Secondary | ICD-10-CM | POA: Diagnosis not present

## 2021-09-24 DIAGNOSIS — N289 Disorder of kidney and ureter, unspecified: Secondary | ICD-10-CM | POA: Diagnosis not present

## 2021-09-24 DIAGNOSIS — Z0000 Encounter for general adult medical examination without abnormal findings: Secondary | ICD-10-CM | POA: Diagnosis not present

## 2021-09-24 DIAGNOSIS — Z7189 Other specified counseling: Secondary | ICD-10-CM | POA: Diagnosis not present

## 2021-09-24 DIAGNOSIS — N528 Other male erectile dysfunction: Secondary | ICD-10-CM

## 2021-09-24 DIAGNOSIS — F320 Major depressive disorder, single episode, mild: Secondary | ICD-10-CM

## 2021-09-24 MED ORDER — SILDENAFIL CITRATE 50 MG PO TABS
ORAL_TABLET | ORAL | 3 refills | Status: DC
  Filled 2021-09-24: qty 10, 20d supply, fill #0
  Filled 2021-09-24: qty 10, fill #0

## 2021-09-24 NOTE — Progress Notes (Signed)
Subjective:   Calvin [name] is a 61 y.o. male who presents for an Initial Medicare Annual Wellness Visit. Pt with hx of HTN, chronic LBP, ED, tobacco, cocaine use, depression, HL, hep C treated at [facility] per pt and cured.  .   Review of Systems    CV: On last visit with me, Diovan was added to HCTZ.  Patient reports that he is taking the medications consistently.  Patient with recent ER visit for abdominal pain and diarrhea.  Found to have increased creatinine of 1.59 with GFR of 49.  GFR a week prior to that was greater than 60 GU: Requesting prescription for Viagra.  States he had been on it in the past.  He is struggled with erectile dysfunction for several years.       Objective:    Today's Vitals   09/24/21 1019  BP: 126/77  Pulse: 62  Resp: 16  SpO2: 98%  Weight: 171 lb (77.6 kg)  Height: 6\' 1"  (1.854 m)   Body mass index is 22.56 kg/m.  Advanced Directives 09/24/2021 07/15/2020 08/06/2019 12/23/2018 05/17/2018 03/09/2018 03/08/2018  Does Patient Have a Medical Advance Directive? No No No Yes No No No  Type of Advance Directive - - - [place]  Would patient like information on creating a medical advance directive? No - Patient declined No - Patient declined No - Patient declined No - Patient declined No - Patient declined No - Patient declined No - Patient declined  Patient initially declined information about advanced directive with my CMA.  However on my meeting with him, pt changed his mind about this and requested information about advanced directive.  I went over with him what is an advanced directive, living will and healthcare power of attorney.  Given our information packet and encouraged him to seriously consider executing a living will and or healthcare power of attorney.  Current Medications (verified) Outpatient Encounter Medications as of 09/24/2021  Medication Sig   atorvastatin (LIPITOR) 10 MG tablet Take 1 tablet (10 mg total) by mouth daily.    cyclobenzaprine (FLEXERIL) 5 MG tablet Take 1 tablet (5 mg total) by mouth 2 (two) times daily as needed for muscle spasms.   gabapentin (NEURONTIN) 300 MG capsule Take 1 capsule by mouth 3 (three) times daily. (Patient not taking: Reported on 09/24/2021)   hydrochlorothiazide (MICROZIDE) 12.5 MG capsule Take 1 capsule (12.5 mg total) by mouth daily.   melatonin 3 MG TABS tablet Take 1 tablet (3 mg total) by mouth at bedtime.   nicotine (NICODERM CQ - DOSED IN MG/24 HR) 7 mg/24hr patch Place 1 patch (7 mg total) onto the skin daily.   ondansetron (ZOFRAN-ODT) 4 MG disintegrating tablet Take 1 tablet (4 mg total) by mouth every 8 (eight) hours as needed for nausea or vomiting. (Patient not taking: Reported on 09/24/2021)   sertraline (ZOLOFT) 100 MG tablet TAKE 1 TABLET BY MOUTH DAILY. (Patient not taking: Reported on 09/24/2021)   SUBLOCADE 300 MG/1.5ML SOSY Inject into the skin.   valsartan (DIOVAN) 40 MG tablet Take 1 tablet (40 mg total) by mouth daily. (Patient not taking: Reported on 09/24/2021)   No facility-administered encounter medications on file as of 09/24/2021.    Allergies (verified) Norvasc [amlodipine]   History: Past Medical History:  Diagnosis Date   Anxiety    Arthritis    back, shoulders    Constipation    Depression    Disc disorder    Hypertension  Lumbar herniated disc    Muscle spasm    Past Surgical History:  Procedure Laterality Date   ANTERIOR CERVICAL DECOMP/DISCECTOMY FUSION N/A 03/08/2018   Procedure: ANTERIOR CERVICAL DECOMPRESSION/DISCECTOMY FUSION , cervical 3-4, cervical 4-5, cervical 5-6 with intrumentational and allograft;  Surgeon: Phylliss Bob, MD;  Location: [facility];  Service: Orthopedics;  Laterality: N/A;   BONE BIOPSY Right 05/17/2018   Procedure: BONE BIOPSY X2;  Surgeon: Evelina Bucy, DPM;  Location: [facility];  Service: Podiatry;  Laterality: Right;  right second toe   COLONOSCOPY     FOOT SURGERY     right, ~ 2002    POLYPECTOMY     Family History  Problem Relation Age of Onset   Diabetes Mother    Hypertension Mother    Heart disease Mother    Hyperlipidemia Mother    Diabetes Brother    Diabetes Daughter    Hypertension Sister    Diabetes Sister    Stomach cancer Maternal Grandfather    Colon cancer Neg Hx    Esophageal cancer Neg Hx    Rectal cancer Neg Hx    Colon polyps Neg Hx    Social History   Socioeconomic History   Marital status: Single    Spouse name: Not on file   Number of children: Not on file   Years of education: Not on file   Highest education level: Not on file  Occupational History   Not on file  Tobacco Use   Smoking status: Every Day    Packs/day: 0.10    Years: 20.00    Pack years: 2.00    Types: Cigarettes   Smokeless tobacco: Never   Tobacco comments:    5 cigs a day   Vaping Use   Vaping Use: Never used  Substance and Sexual Activity   Alcohol use: No   Drug use: No   Sexual activity: Not Currently  Other Topics Concern   Not on file  Social History Narrative   Not on file   Social Determinants of Health   Financial Resource Strain: Not on file  Food Insecurity: Not on file  Transportation Needs: Not on file  Physical Activity: Not on file  Stress: Not on file  Social Connections: Not on file    Tobacco Counseling Has not smoke in last few days.  Did call [employer] Now and received the nicotine patches and gum.  Using patches and gum. Does not drink ETOH beverages On Sublocade inj once a mth for opioid addiction (Percocets and heroin).  Clinical Intake:  Pain : No/denies pain  Diabetes: No     Diabetic?  Not diabetic.  Activities of Daily Living In your present state of health, do you have any difficulty performing the following activities: 09/24/2021  Hearing? N  Vision? Y  Difficulty concentrating or making decisions? N  Walking or climbing stairs? Y  Dressing or bathing? N  Doing errands, shopping? N  Preparing Food and  eating ? N  Using the Toilet? N  In the past six months, have you accidently leaked urine? N  Do you have problems with loss of bowel control? N  Managing your Medications? N  Managing your Finances? N  Housekeeping or managing your Housekeeping? N  Some recent data might be hidden  Has appt for eye exam coming up this month Uses cane only when he has to walk a good distance.  Like from a parking lot into a building.  Has cane  with him today Saw Dr. Caprice Renshaw, ortho at [facility] since last visit.  Plan is for hip replacement and extension of LT leg on 10/22/21  Patient Care Team: Ladell Pier, MD as PCP - General (Internal Medicine) Clementeen Hoof, MD-[facility]  Indicate any recent Medical Services you may have received from other than [facility] providers in the past year (date may be approximate). Patient has been seeing [facility].  Recently seen last month.    Assessment:   This is a routine wellness examination for Samuell.  Hearing/Vision screen Vision screen not done as pt has appt with eye doctor later this mth Whisper test normal  Dietary issues and exercise activities discussed: -Would like to eat more.  He eats a lot healthier.  Not much fried foods   Goals Addressed   None   Depression Screen PHQ 2/9 Scores 09/24/2021 08/03/2021 11/06/2020 03/27/2020 09/05/2019 08/15/2018 05/25/2018  PHQ - 2 Score 4 4 4 2  0 4 2  PHQ- 9 Score 12 13 17 13  0 13 9   -he stopped the Zoloft does not feel he needs it any more even though PHQ9 still positive.  Fall Risk Fall Risk  09/24/2021 08/03/2021 11/06/2020 09/05/2019 05/25/2018  Falls in the past year? 0 0 0 1 Yes  Number falls in past yr: 0 0 0 0 1  Injury with Fall? 0 0 0 1 Yes  Risk for fall due to : No Fall Risks No Fall Risks - - -    FALL RISK PREVENTION PERTAINING TO THE HOME:  Any stairs in or around the home? Yes  If so, are there any without handrails? No  Home free of loose throw rugs in walkways, pet beds, electrical cords,  etc? Yes  Adequate lighting in your home to reduce risk of falls? Yes   ASSISTIVE DEVICES UTILIZED TO PREVENT FALLS:  Life alert? No  but like one Use of a cane, walker or w/c? Yes  Grab bars in the bathroom? Yes  Shower chair or bench in shower? Yes  Elevated toilet seat or a handicapped toilet? No  does not want one  TIMED UP AND GO:  Was the test performed? Yes .  Length of time to ambulate 10 feet: 14 sec.   Gait unsteady with use of assistive device, provider informed and education provided.   Cognitive Function: MMSE - Mini Mental State Exam 09/24/2021  Orientation to time 5  Orientation to Place 5  Registration 3  Attention/ Calculation 5  Recall 3  Language- name 2 objects 2  Language- repeat 1  Language- follow 3 step command 3  Language- read & follow direction 1  Write a sentence 1  Copy design 1  Total score 30        Immunizations Immunization History  Administered Date(s) Administered   Influenza,inj,Quad PF,6+ Mos 06/04/2014, 05/28/2015, 05/13/2016, 05/15/2018, 03/27/2020, 04/28/2021   Janssen (J&J) SARS-COV-2 Vaccination 12/28/2019   PFIZER(Purple Top)SARS-COV-2 Vaccination 09/01/2020   Pfizer Covid-19 Vaccine Bivalent Booster 22yrs & up 04/18/2021   Pneumococcal Conjugate (Pcv15) 08/03/2021   Pneumococcal Polysaccharide-23 06/04/2014   Tdap 06/26/2015   Zoster Recombinat (Shingrix) 08/15/2021    TDAP status: Up to date  Flu Vaccine status: Up to date  Pneumococcal vaccine status: Up to date  Covid-19 vaccine status: Completed vaccines  Qualifies for Shingles Vaccine? Yes   Zostavax completed No   Shingrix Completed?: No.    Education has been provided regarding the importance of this vaccine. Patient has been advised to  call insurance company to determine out of pocket expense if they have not yet received this vaccine. Advised may also receive vaccine at local pharmacy or Health Dept. Verbalized acceptance and understanding.  Screening  Tests Health Maintenance  Topic Date Due   Zoster Vaccines- Shingrix (2 of 2) 10/10/2021   TETANUS/TDAP  06/25/2025   COLONOSCOPY (Pts 45-84yrs Insurance coverage will need to be confirmed)  02/26/2027   INFLUENZA VACCINE  Completed   COVID-19 Vaccine  Completed   Hepatitis C Screening  Completed   HIV Screening  Completed   HPV VACCINES  Aged Out    Health Maintenance  There are no preventive care reminders to display for this patient.  Colorectal cancer screening: Type of screening: Colonoscopy. Completed 8/201819. Repeat every 10 years  Lung Cancer Screening: (Low Dose CT Chest recommended if Age 4-80 years, 30 pack-year currently smoking OR have quit w/in 15years.) does not qualify.  Smoked for 20 yrs but never 1 pk a day Lung Cancer Screening Referral: NA  Additional Screening:  Hepatitis C Screening: does qualify; Completed 08/2021.  Pt with hx of hep C that was treated at [facility] per his report.  Agrees to have RNA level checked today to confirm SVR.  Vision Screening: Recommended annual ophthalmology exams for early detection of glaucoma and other disorders of the eye. Is the patient up to date with their annual eye exam?  No  Who is the provider or what is the name of the office in which the patient attends annual eye exams? Has exam scheduled for this mth If pt is not established with a provider, would they like to be referred to a provider to establish care? No .   Dental Screening: Recommended annual dental exams for proper oral hygiene  Community Resource Referral / Chronic Care Management: CRR required this visit?  No   CCM required this visit?  No      Plan:    1. Encounter for Medicare annual wellness exam   2. Advance directive discussed with patient Patient given packet to take home to review.  Advised that if he executes a living will or healthcare power of attorney, he should bring a copy for [name] to put in the EMR.  3. Essential hypertension At goal.   Continue low-dose HCTZ and Diovan  4. Renal insufficiency Seems as though patient was dehydrated when he was in the ER 5 days ago.  He was given fluids.  We will recheck BMP today.  Encourage patient to drink several glasses of water daily. - Basic Metabolic Panel  5. Other male erectile dysfunction I think it is reasonable to put him on Viagra. Pt advised of possible side effects of this medication including prolong erection, flushing, headaches, stuff nose and sudden vision and hearing changes.  Pt told to be seen in ER if he has erection lasting longer than 3-4 hrs, or if he has sudden vision changes or hearing loss. - sildenafil (VIAGRA) 50 MG tablet; 1 tab PO 1/2 hr prior to intercourse PRN.  Max 1 tab/24 hr  Dispense: 10 tablet; Refill: 3  6. Gait disturbance This is chronic.  However patient feels he would still benefit from some physical therapy for gait safety - Ambulatory referral to Physical Therapy  7. Tobacco dependence Commended him on trying to quit and for being tobacco free for 2 days so far.  Encouraged him to continue using nicotine replacement therapy.  8. Hepatitis C virus infection cured after antiviral drug therapy -  HCV RNA quant  9. Mild major depression [facility]) Patient feels this is no longer an issue for him.  He has stopped the Zoloft.  I have removed it from his med list.  I have personally reviewed and noted the following in the patients chart:   Medical and social history Use of alcohol, tobacco or illicit drugs  Current medications and supplements including opioid prescriptions. Patient is not currently taking opioid prescriptions. Functional ability and status Nutritional status Physical activity Advanced directives List of other physicians Hospitalizations, surgeries, and ER visits in previous 12 months Vitals Screenings to include cognitive, depression, and falls Referrals and appointments  In addition, I have reviewed and discussed with  patient certain preventive protocols, quality metrics, and best practice recommendations. A written personalized care plan for preventive services as well as general preventive health recommendations were provided to patient.     Karle Plumber, MD   09/24/2021

## 2021-09-24 NOTE — Patient Instructions (Signed)
Please keep your eye appointment. ?Continue to work on trying to quit smoking. ?If you decide to execute an advanced directive, please bring [name] a copy to put in your records. ?I will submit the referral for the physical therapy for you. ? ?

## 2021-09-25 ENCOUNTER — Other Ambulatory Visit: Payer: Self-pay

## 2021-09-25 ENCOUNTER — Other Ambulatory Visit: Payer: Self-pay | Admitting: Internal Medicine

## 2021-09-25 DIAGNOSIS — I10 Essential (primary) hypertension: Secondary | ICD-10-CM

## 2021-09-25 LAB — HCV RNA QUANT

## 2021-09-25 MED ORDER — VALSARTAN 40 MG PO TABS
40.0000 mg | ORAL_TABLET | Freq: Every day | ORAL | 0 refills | Status: DC
  Filled 2021-09-25 – 2021-11-10 (×2): qty 90, 90d supply, fill #0

## 2021-09-25 MED ORDER — ATORVASTATIN CALCIUM 10 MG PO TABS
10.0000 mg | ORAL_TABLET | Freq: Every day | ORAL | 0 refills | Status: DC
  Filled 2021-09-25 – 2021-11-10 (×2): qty 90, 90d supply, fill #0

## 2021-09-25 NOTE — Telephone Encounter (Signed)
Requested Prescriptions  ?Pending Prescriptions Disp Refills  ?? valsartan (DIOVAN) 40 MG tablet 90 tablet 0  ?  Sig: Take 1 tablet (40 mg total) by mouth daily.  ?  ? Cardiovascular:  Angiotensin Receptor Blockers Failed - 09/25/2021  4:47 PM  ?  ?  Failed - Cr in normal range and within 180 days  ?  Creat  ?Date Value Ref Range Status  ?12/01/2015 1.01 0.70 - 1.33 mg/dL Final  ? ?Creatinine, Ser  ?Date Value Ref Range Status  ?09/24/2021 1.34 (H) 0.76 - 1.27 mg/dL Final  ?   ?  ?  Passed - K in normal range and within 180 days  ?  Potassium  ?Date Value Ref Range Status  ?09/24/2021 4.6 3.5 - 5.2 mmol/L Final  ?   ?  ?  Passed - Patient is not pregnant  ?  ?  Passed - Last BP in normal range  ?  BP Readings from Last 1 Encounters:  ?09/24/21 126/77  ?   ?  ?  Passed - Valid encounter within last 6 months  ?  Recent Outpatient Visits   ?      ? Yesterday Encounter for Medicare annual wellness exam  ? [place] Ladell Pier, MD  ? 1 month ago Essential hypertension  ? [place] Ladell Pier, MD  ? 6 months ago Essential hypertension  ? [place] Ladell Pier, MD  ? 10 months ago Essential hypertension  ? [place] Ladell Pier, MD  ? 1 year ago Essential hypertension  ? [place] Ladell Pier, MD  ?  ?  ? ?  ?  ?  ?? atorvastatin (LIPITOR) 10 MG tablet 90 tablet 0  ?  Sig: Take 1 tablet (10 mg total) by mouth daily.  ?  ? Cardiovascular:  Antilipid - Statins Failed - 09/25/2021  4:47 PM  ?  ?  Failed - Lipid Panel in normal range within the last 12 months  ?  Cholesterol, Total  ?Date Value Ref Range Status  ?08/03/2021 195 100 - 199 mg/dL Final  ? ?LDL Chol Calc (NIH)  ?Date Value Ref Range Status  ?08/03/2021 129 (H) 0 - 99 mg/dL Final  ? ?HDL  ?Date Value Ref Range Status  ?08/03/2021 56 >39 mg/dL Final  ? ?Triglycerides  ?Date  Value Ref Range Status  ?08/03/2021 56 0 - 149 mg/dL Final  ? ?  ?  ?  Passed - Patient is not pregnant  ?  ?  Passed - Valid encounter within last 12 months  ?  Recent Outpatient Visits   ?      ? Yesterday Encounter for Medicare annual wellness exam  ? [place] Ladell Pier, MD  ? 1 month ago Essential hypertension  ? [place] Ladell Pier, MD  ? 6 months ago Essential hypertension  ? [place] Ladell Pier, MD  ? 10 months ago Essential hypertension  ? [place] Ladell Pier, MD  ? 1 year ago Essential hypertension  ? [place] Ladell Pier, MD  ?  ?  ? ?  ?  ?  ? ? ?

## 2021-09-26 LAB — BASIC METABOLIC PANEL
BUN/Creatinine Ratio: 10 (ref 10–24)
BUN: 14 mg/dL (ref 8–27)
CO2: 26 mmol/L (ref 20–29)
Calcium: 9 mg/dL (ref 8.6–10.2)
Chloride: 105 mmol/L (ref 96–106)
Creatinine, Ser: 1.34 mg/dL — ABNORMAL HIGH (ref 0.76–1.27)
Glucose: 88 mg/dL (ref 70–99)
Potassium: 4.6 mmol/L (ref 3.5–5.2)
Sodium: 143 mmol/L (ref 134–144)
eGFR: 61 mL/min/{1.73_m2} (ref >59)

## 2021-09-26 LAB — HCV RNA QUANT: Hepatitis C Quantitation: NOT DETECTED IU/mL

## 2021-09-27 NOTE — Progress Notes (Signed)
Let patient know that blood test confirms cure from hepatitis C.  Kidney function has improved.

## 2021-09-28 ENCOUNTER — Other Ambulatory Visit: Payer: Self-pay

## 2021-09-29 ENCOUNTER — Ambulatory Visit: Payer: Medicare Other | Attending: Internal Medicine

## 2021-09-29 ENCOUNTER — Other Ambulatory Visit: Payer: Self-pay

## 2021-09-29 DIAGNOSIS — M6281 Muscle weakness (generalized): Secondary | ICD-10-CM | POA: Diagnosis not present

## 2021-09-29 DIAGNOSIS — R262 Difficulty in walking, not elsewhere classified: Secondary | ICD-10-CM | POA: Insufficient documentation

## 2021-09-29 DIAGNOSIS — M25552 Pain in left hip: Secondary | ICD-10-CM | POA: Insufficient documentation

## 2021-09-29 DIAGNOSIS — M545 Low back pain, unspecified: Secondary | ICD-10-CM | POA: Insufficient documentation

## 2021-09-29 DIAGNOSIS — R269 Unspecified abnormalities of gait and mobility: Secondary | ICD-10-CM | POA: Insufficient documentation

## 2021-09-29 DIAGNOSIS — M25652 Stiffness of left hip, not elsewhere classified: Secondary | ICD-10-CM | POA: Diagnosis not present

## 2021-09-29 DIAGNOSIS — G8929 Other chronic pain: Secondary | ICD-10-CM | POA: Insufficient documentation

## 2021-09-29 NOTE — Therapy (Signed)
?OUTPATIENT PHYSICAL THERAPY LOWER EXTREMITY EVALUATION ? ? ?Patient Name: Calvin [name] ?MRN: [id number] ?DOB:11-07-1960, 61 y.o., male ?Today's Date: 09/30/2021 ? ? PT End of Session - 09/30/21 0555   ? ? Visit Number 1   ? Number of Visits 7   ? Date for PT Re-Evaluation 10/30/21   ? Authorization Type [employer] MEDICARE   ? PT Start Time 8099   ? PT Stop Time 8338   ? PT Time Calculation (min) 45 min   ? Activity Tolerance Patient tolerated treatment well   ? Behavior During Therapy [facility] for tasks assessed/performed   ? ?  ?  ? ?  ? ? ?Past Medical History:  ?Diagnosis Date  ? Anxiety   ? Arthritis   ? back, shoulders   ? Constipation   ? Depression   ? Disc disorder   ? Hypertension   ? Lumbar herniated disc   ? Muscle spasm   ? ?Past Surgical History:  ?Procedure Laterality Date  ? ANTERIOR CERVICAL DECOMP/DISCECTOMY FUSION N/A 03/08/2018  ? Procedure: ANTERIOR CERVICAL DECOMPRESSION/DISCECTOMY FUSION , cervical 3-4, cervical 4-5, cervical 5-6 with intrumentational and allograft;  Surgeon: Phylliss Bob, MD;  Location: [place];  Service: Orthopedics;  Laterality: N/A;  ? BONE BIOPSY Right 05/17/2018  ? Procedure: BONE BIOPSY X2;  Surgeon: Evelina Bucy, DPM;  Location[facility];  Service: Podiatry;  Laterality: Right;  right second toe  ? COLONOSCOPY    ? FOOT SURGERY    ? right, ~ 2002  ? POLYPECTOMY    ? ?Patient Active Problem List  ? Diagnosis Date Noted  ? Hepatitis C virus infection cured after antiviral drug therapy 09/24/2021  ? Chronic hepatitis C ([place]) 09/17/2021  ? Hyperlipidemia 08/05/2021  ? CKD (chronic kidney disease) stage 3, GFR 30-59 ml/min (HCC) 08/05/2021  ? Insomnia 08/03/2021  ? Tobacco dependence 08/03/2021  ? Opioid use disorder in remission 08/03/2021  ? Severe sepsis ([place]) 08/07/2019  ? Lobar pneumonia ([place]) 08/07/2019  ? Diarrhea 08/07/2019  ? Anemia 08/07/2019  ? Foot laceration, right, initial encounter   ? Radiculopathy 03/08/2018  ? Tubular adenoma 05/16/2017  ? Depressed  mood 05/16/2017  ? Substance abuse ([place]) 05/16/2017  ? Hallux valgus 08/03/2016  ? Disc disorder   ? Lumbar facet arthropathy 08/06/2014  ? Lumbar radiculopathy 08/06/2014  ? ED (erectile dysfunction) 01/07/2014  ? Unspecified vitamin D deficiency 01/07/2014  ? Left shoulder pain 08/10/2013  ? Chronic low back pain 08/10/2013  ? Essential hypertension 08/10/2013  ? Tobacco abuse 08/10/2013  ? ? ?PCP: Ladell Pier, MD ? ?REFERRING PROVIDER: Ladell Pier, MD ? ?REFERRING DIAG: Gait disturbance ? ?THERAPY DIAG:  ?Difficulty in walking, not elsewhere classified ? ?Pain in left hip ? ?Muscle weakness (generalized) ? ?ONSET DATE: 5 years ? ?SUBJECTIVE:  ? ?SUBJECTIVE STATEMENT: ?Pt reports having issues with balance which has been progressively wrose and he has L hip pain for which he is to have a THR on 10/22/21. Pt reports his L LE is shorter than the R. Pt notes being told his R hip will need a THR at some point. ? ?PERTINENT HISTORY: ?Arthritis, lumbar herniated disc, HTN, depression, anxiety ? ?PAIN:  ?Are you having pain? Yes ?NPRS scale: 5/10 wrose 8/10 ?Pain location: hip ?Pain orientation: Right  ?PAIN TYPE: aching ?Pain description: constant  ?Aggravating factors: how he sleeps, prolonged standing and walking ?Relieving factors: Rest, BC powders ? ?PRECAUTIONS: None ? ?WEIGHT BEARING RESTRICTIONS No ? ?FALLS:  ?Has patient  fallen in last 6 months? No ? ?LIVING ENVIRONMENT: ?Lives with: lives with their family ?Pt reports no issues with accessing or mobility within home ? ?OCCUPATION: Disability, would like to work some ? ?PLOF: Independent ? ?PATIENT GOALS To loosen  ? ? ?OBJECTIVE:  ? ?DIAGNOSTIC FINDINGS: No recent imaging available ? ?PATIENT SURVEYS:  ?NA due to short timetime of PT prior to upcoming L THR ? ?COGNITION: ? Overall cognitive status: Within functional limits for tasks assessed   ?  ?SENSATION: ? Light touch: Appears intact R LE numbness, LT but grossly intact  ? ?POSTURE:  ?Due to R  flexed knee, appearance of R lateral shift of shoulders on pelvis; forward flexed trunk, valgus both knee  ? ?PALPATION: ?TTP to the L lateral hip ? ?LE AROM/PROM: ? ?A/PROM Right ?09/30/2021 Left ?09/30/2021  ?Hip flexion 85 70  ?Hip extension    ?Hip abduction 11 22  ?Hip adduction    ?Hip internal rotation 15 18  ?Hip external rotation    ?Knee flexion    ?Knee extension    ?Ankle dorsiflexion 3 4  ?Ankle plantarflexion    ?Ankle inversion    ?Ankle eversion    ? (Blank rows = not tested) ? ?LE MMT: ? ?MMT Right ?09/30/2021 Left ?09/30/2021  ?Hip flexion 4 4-  ?Hip extension 4 4-  ?Hip abduction 4 4-  ?Hip adduction 4 4-  ?Hip internal rotation 4 4-  ?Hip external rotation 4 4-  ?Knee flexion 5 5  ?Knee extension 5 5  ?Ankle dorsiflexion 4 4  ?Ankle plantarflexion 3 3  ?Ankle inversion    ?Ankle eversion    ? (Blank rows = not tested) ? ?LOWER EXTREMITY SPECIAL TESTS:  ?Hip special tests: Saralyn Pilar (FABER) test: positive  ? ?FUNCTIONAL TESTS:  ?5 times sit to stand: 17.3 ? ?GAIT: ?Distance walked: 150 in clinic ?Assistive device utilized: Single point cane ?Level of assistance: Complete Independence ?Comments:narrow BOS, knee valgus with knees touching, decreased step length bilat  ? ?Quick Balance: ?SLS- 3 sec bilat ?Narrow BOS- 1 min ?EC feet apart- 10 sec ? ?TODAY'S TREATMENT: ?Not provided due to length of eval ? ?PATIENT EDUCATION:  ?Education details: Eval findings, POC  ?Person educated: Patient ?Education method: Explanation ?Education comprehension: verbalized understanding ? ? ?HOME EXERCISE PROGRAM: ?Not provided today due to length of eval ? ?ASSESSMENT: ? ?CLINICAL IMPRESSION: ?Patient is a 61 y.o. M who was seen today for physical therapy evaluation and treatment for gait disturbance. Pt has decreased hip ROM and hip strength due to arthritis, L>R; and decreased bilat ankle DF ROM and strength; and hip pain, L>R, which are impacting the pt's gait quality, balance, and tolerance ? ? ?OBJECTIVE IMPAIRMENTS  Abnormal gait, decreased activity tolerance, decreased balance, difficulty walking, decreased ROM, decreased strength, impaired flexibility, postural dysfunction, and pain.  ? ?ACTIVITY LIMITATIONS cleaning, community activity, meal prep, laundry, yard work, and shopping.  ? ?PERSONAL FACTORS Arthritis, lumbar herniated disc are also affecting patient's functional outcome.  ? ? ?REHAB POTENTIAL: Good ? ?CLINICAL DECISION MAKING: Evolving/moderate complexity ? ?EVALUATION COMPLEXITY: Moderate ? ? ?GOALS: ? ?SHORT TERM GOALS = LTGs ? ?LONG TERM GOALS: ? ?Ind in a HEP to maintain achieved LOF ?Baseline:  ?Target date: 10/30/21 ?Goal status: INITIAL ? ?2.  Decrease 5xSTS to 14 sec of less for improved function ?Baseline: 17.3 ?Target date: 10/30/21 ?Goal status: INITIAL ? ?3.  Increase ankle DF to 10d for  improved gait ?Baseline: see flow sheets ?Target date: 10/30/21 ?Goal status: INITIAL ? ?  4.  Asess Berg and set goal ?Baseline: TBA ?Target date: 10/30/21 ?Goal status: INITIAL ? ?5. Increase L hip strength to 4/5 prior to THR ? Baseline: 4-/5 ? Target Date: 10/30/21 ? Goal Status: INITIAL ? ?PLAN: ?PT FREQUENCY: 2x/week ? ?PT DURATION: 3 weeks ? ?PLANNED INTERVENTIONS: Therapeutic exercises, Therapeutic activity, Neuromuscular re-education, Balance training, Gait training, Patient/Family education, Joint mobilization, Stair training, Cryotherapy, Moist heat, and Manual therapy ? ?PLAN FOR NEXT SESSION: Complete BERG; initiate HEP ? ?Gar Ponto MS, PT ?09/30/21 1:31 PM ? ? ?

## 2021-10-02 ENCOUNTER — Other Ambulatory Visit: Payer: Self-pay

## 2021-10-06 ENCOUNTER — Ambulatory Visit: Payer: Medicare Other

## 2021-10-06 NOTE — Therapy (Incomplete)
?OUTPATIENT PHYSICAL THERAPY TREATMENT NOTE ? ? ?Patient Name: Calvin [name] ?MRN: [id number] ?DOB:22-Oct-1960, 61 y.o., male ?Today's Date: 10/06/2021 ? ?PCP: Ladell Pier, MD ?REFERRING PROVIDER: Ladell Pier, MD ? ? ? ?Past Medical History:  ?Diagnosis Date  ? Anxiety   ? Arthritis   ? back, shoulders   ? Constipation   ? Depression   ? Disc disorder   ? Hypertension   ? Lumbar herniated disc   ? Muscle spasm   ? ?Past Surgical History:  ?Procedure Laterality Date  ? ANTERIOR CERVICAL DECOMP/DISCECTOMY FUSION N/A 03/08/2018  ? Procedure: ANTERIOR CERVICAL DECOMPRESSION/DISCECTOMY FUSION , cervical 3-4, cervical 4-5, cervical 5-6 with intrumentational and allograft;  Surgeon: Phylliss Bob, MD;  Location: [place];  Service: Orthopedics;  Laterality: N/A;  ? BONE BIOPSY Right 05/17/2018  ? Procedure: BONE BIOPSY X2;  Surgeon: Evelina Bucy, DPM;  Location: [facility];  Service: Podiatry;  Laterality: Right;  right second toe  ? COLONOSCOPY    ? FOOT SURGERY    ? right, ~ 2002  ? POLYPECTOMY    ? ?Patient Active Problem List  ? Diagnosis Date Noted  ? Hepatitis C virus infection cured after antiviral drug therapy 09/24/2021  ? Chronic hepatitis C ([place]) 09/17/2021  ? Hyperlipidemia 08/05/2021  ? CKD (chronic kidney disease) stage 3, GFR 30-59 ml/min (HCC) 08/05/2021  ? Insomnia 08/03/2021  ? Tobacco dependence 08/03/2021  ? Opioid use disorder in remission 08/03/2021  ? Severe sepsis ([place]) 08/07/2019  ? Lobar pneumonia ([place]) 08/07/2019  ? Diarrhea 08/07/2019  ? Anemia 08/07/2019  ? Foot laceration, right, initial encounter   ? Radiculopathy 03/08/2018  ? Tubular adenoma 05/16/2017  ? Depressed mood 05/16/2017  ? Substance abuse ([place]) 05/16/2017  ? Hallux valgus 08/03/2016  ? Disc disorder   ? Lumbar facet arthropathy 08/06/2014  ? Lumbar radiculopathy 08/06/2014  ? ED (erectile dysfunction) 01/07/2014  ? Unspecified vitamin D deficiency 01/07/2014  ? Left shoulder pain 08/10/2013  ? Chronic low  back pain 08/10/2013  ? Essential hypertension 08/10/2013  ? Tobacco abuse 08/10/2013  ? ? ?REFERRING DIAG: *** ? ?THERAPY DIAG:  ?No diagnosis found. ? ?PERTINENT HISTORY: *** ? ?PRECAUTIONS: *** ? ?SUBJECTIVE:  ?  ?SUBJECTIVE STATEMENT: ?Pt reports having issues with balance which has been progressively wrose and he has L hip pain for which he is to have a THR on 10/22/21. Pt reports his L LE is shorter than the R. Pt notes being told his R hip will need a THR at some point. ?  ?PERTINENT HISTORY: ?Arthritis, lumbar herniated disc, HTN, depression, anxiety ?  ?PAIN:  ?Are you having pain? Yes ?NPRS scale: 5/10 wrose 8/10 ?Pain location: hip ?Pain orientation: Right  ?PAIN TYPE: aching ?Pain description: constant  ?Aggravating factors: how he sleeps, prolonged standing and walking ?Relieving factors: Rest, BC powders ?  ?PRECAUTIONS: None ?  ?WEIGHT BEARING RESTRICTIONS No ?  ?FALLS:  ?Has patient fallen in last 6 months? No ?  ?LIVING ENVIRONMENT: ?Lives with: lives with their family ?Pt reports no issues with accessing or mobility within home ?  ?OCCUPATION: Disability, would like to work some ?  ?PLOF: Independent ?  ?PATIENT GOALS To loosen  ?  ?  ?OBJECTIVE:  ?  ?DIAGNOSTIC FINDINGS: No recent imaging available ?  ?PATIENT SURVEYS:  ?NA due to short timetime of PT prior to upcoming L THR ?  ?COGNITION: ?         Overall cognitive status: Within functional limits for  tasks assessed              ?          ?SENSATION: ?         Light touch: Appears intact R LE numbness, LT but grossly intact  ?  ?POSTURE:  ?Due to R flexed knee, appearance of R lateral shift of shoulders on pelvis; forward flexed trunk, valgus both knee  ?  ?PALPATION: ?TTP to the L lateral hip ?  ?LE AROM/PROM: ?  ?A/PROM Right ?09/30/2021 Left ?09/30/2021  ?Hip flexion 85 70  ?Hip extension      ?Hip abduction 11 22  ?Hip adduction      ?Hip internal rotation 15 18  ?Hip external rotation      ?Knee flexion      ?Knee extension      ?Ankle  dorsiflexion 3 4  ?Ankle plantarflexion      ?Ankle inversion      ?Ankle eversion      ? (Blank rows = not tested) ?  ?LE MMT: ?  ?MMT Right ?09/30/2021 Left ?09/30/2021  ?Hip flexion 4 4-  ?Hip extension 4 4-  ?Hip abduction 4 4-  ?Hip adduction 4 4-  ?Hip internal rotation 4 4-  ?Hip external rotation 4 4-  ?Knee flexion 5 5  ?Knee extension 5 5  ?Ankle dorsiflexion 4 4  ?Ankle plantarflexion 3 3  ?Ankle inversion      ?Ankle eversion      ? (Blank rows = not tested) ?  ?LOWER EXTREMITY SPECIAL TESTS:  ?Hip special tests: Saralyn Pilar (FABER) test: positive  ?  ?FUNCTIONAL TESTS:  ?5 times sit to stand: 17.3 ?  ?GAIT: ?Distance walked: 150 in clinic ?Assistive device utilized: Single point cane ?Level of assistance: Complete Independence ?Comments:narrow BOS, knee valgus with knees touching, decreased step length bilat  ?  ?Quick Balance: ?SLS- 3 sec bilat ?Narrow BOS- 1 min ?EC feet apart- 10 sec ?  ?TODAY'S TREATMENT: ?Not provided due to length of eval ?  ?PATIENT EDUCATION:  ?Education details: Eval findings, POC  ?Person educated: Patient ?Education method: Explanation ?Education comprehension: verbalized understanding ?  ?  ?HOME EXERCISE PROGRAM: ?Not provided today due to length of eval ?  ?ASSESSMENT: ?  ?CLINICAL IMPRESSION: ?Patient is a 61 y.o. M who was seen today for physical therapy evaluation and treatment for gait disturbance. Pt has decreased hip ROM and hip strength due to arthritis, L>R; and decreased bilat ankle DF ROM and strength; and hip pain, L>R, which are impacting the pt's gait quality, balance, and tolerance ?  ?  ?OBJECTIVE IMPAIRMENTS Abnormal gait, decreased activity tolerance, decreased balance, difficulty walking, decreased ROM, decreased strength, impaired flexibility, postural dysfunction, and pain.  ?  ?ACTIVITY LIMITATIONS cleaning, community activity, meal prep, laundry, yard work, and shopping.  ?  ?PERSONAL FACTORS Arthritis, lumbar herniated disc are also affecting patient's  functional outcome.  ?  ?  ?REHAB POTENTIAL: Good ?  ?CLINICAL DECISION MAKING: Evolving/moderate complexity ?  ?EVALUATION COMPLEXITY: Moderate ?  ?  ?GOALS: ?  ?SHORT TERM GOALS = LTGs ?  ?LONG TERM GOALS: ?  ?Ind in a HEP to maintain achieved LOF ?Baseline:  ?Target date: 10/30/21 ?Goal status: INITIAL ?  ?2.  Decrease 5xSTS to 14 sec of less for improved function ?Baseline: 17.3 ?Target date: 10/30/21 ?Goal status: INITIAL ?  ?3.  Increase ankle DF to 10d for  improved gait ?Baseline: see flow sheets ?Target date: 10/30/21 ?Goal status: INITIAL ?  ?  4.  Asess Berg and set goal ?Baseline: TBA ?Target date: 10/30/21 ?Goal status: INITIAL ?  ?5. Increase L hip strength to 4/5 prior to THR ?          Baseline: 4-/5 ?          Target Date: 10/30/21 ?          Goal Status: INITIAL ?  ?PLAN: ?PT FREQUENCY: 2x/week ?  ?PT DURATION: 3 weeks ?  ?PLANNED INTERVENTIONS: Therapeutic exercises, Therapeutic activity, Neuromuscular re-education, Balance training, Gait training, Patient/Family education, Joint mobilization, Stair training, Cryotherapy, Moist heat, and Manual therapy ?  ?PLAN FOR NEXT SESSION: Complete BERG; initiate HEP ? ? ? ?Gar Ponto, PT ?10/06/2021, 6:13 AM ? ?   ? ?

## 2021-10-07 ENCOUNTER — Telehealth: Payer: Self-pay

## 2021-10-07 NOTE — Telephone Encounter (Signed)
Left VM for 10/06/21 no show appt. Advised re: upcoming appt on 10/07/21 and the attendance policy. ?

## 2021-10-08 ENCOUNTER — Ambulatory Visit: Payer: Medicare Other

## 2021-10-08 ENCOUNTER — Other Ambulatory Visit: Payer: Self-pay

## 2021-10-08 DIAGNOSIS — R262 Difficulty in walking, not elsewhere classified: Secondary | ICD-10-CM | POA: Diagnosis not present

## 2021-10-08 DIAGNOSIS — M25652 Stiffness of left hip, not elsewhere classified: Secondary | ICD-10-CM | POA: Diagnosis not present

## 2021-10-08 DIAGNOSIS — M6281 Muscle weakness (generalized): Secondary | ICD-10-CM | POA: Diagnosis not present

## 2021-10-08 DIAGNOSIS — M25552 Pain in left hip: Secondary | ICD-10-CM | POA: Diagnosis not present

## 2021-10-08 DIAGNOSIS — M545 Low back pain, unspecified: Secondary | ICD-10-CM | POA: Diagnosis not present

## 2021-10-08 DIAGNOSIS — G8929 Other chronic pain: Secondary | ICD-10-CM | POA: Diagnosis not present

## 2021-10-08 DIAGNOSIS — R269 Unspecified abnormalities of gait and mobility: Secondary | ICD-10-CM | POA: Diagnosis not present

## 2021-10-08 NOTE — Therapy (Signed)
?OUTPATIENT PHYSICAL THERAPY TREATMENT NOTE ? ? ?Patient Name: Calvin [name] ?MRN: [id number] ?DOB:1961/05/13, 61 y.o., male ?Today's Date: 10/08/2021 ? ?PCP: Ladell Pier, MD ?REFERRING PROVIDER: Ladell Pier, MD ? ? PT End of Session - 10/08/21 1425   ? ? Visit Number 2   ? Number of Visits 7   ? Date for PT Re-Evaluation 10/30/21   ? Authorization Type [employer] MEDICARE   ? PT Start Time 1421   ? PT Stop Time 1503   ? PT Time Calculation (min) 42 min   ? Activity Tolerance Patient tolerated treatment well   ? Behavior During Therapy [facility] for tasks assessed/performed   ? ?  ?  ? ?  ? ? ?Past Medical History:  ?Diagnosis Date  ? Anxiety   ? Arthritis   ? back, shoulders   ? Constipation   ? Depression   ? Disc disorder   ? Hypertension   ? Lumbar herniated disc   ? Muscle spasm   ? ?Past Surgical History:  ?Procedure Laterality Date  ? ANTERIOR CERVICAL DECOMP/DISCECTOMY FUSION N/A 03/08/2018  ? Procedure: ANTERIOR CERVICAL DECOMPRESSION/DISCECTOMY FUSION , cervical 3-4, cervical 4-5, cervical 5-6 with intrumentational and allograft;  Surgeon: Phylliss Bob, MD;  Location: [place];  Service: Orthopedics;  Laterality: N/A;  ? BONE BIOPSY Right 05/17/2018  ? Procedure: BONE BIOPSY X2;  Surgeon: Evelina Bucy, DPM;  Location: [facility];  Service: Podiatry;  Laterality: Right;  right second toe  ? COLONOSCOPY    ? FOOT SURGERY    ? right, ~ 2002  ? POLYPECTOMY    ? ?Patient Active Problem List  ? Diagnosis Date Noted  ? Hepatitis C virus infection cured after antiviral drug therapy 09/24/2021  ? Chronic hepatitis C ([place]) 09/17/2021  ? Hyperlipidemia 08/05/2021  ? CKD (chronic kidney disease) stage 3, GFR 30-59 ml/min (HCC) 08/05/2021  ? Insomnia 08/03/2021  ? Tobacco dependence 08/03/2021  ? Opioid use disorder in remission 08/03/2021  ? Severe sepsis ([place]) 08/07/2019  ? Lobar pneumonia ([place]) 08/07/2019  ? Diarrhea 08/07/2019  ? Anemia 08/07/2019  ? Foot laceration, right, initial encounter   ?  Radiculopathy 03/08/2018  ? Tubular adenoma 05/16/2017  ? Depressed mood 05/16/2017  ? Substance abuse ([place]) 05/16/2017  ? Hallux valgus 08/03/2016  ? Disc disorder   ? Lumbar facet arthropathy 08/06/2014  ? Lumbar radiculopathy 08/06/2014  ? ED (erectile dysfunction) 01/07/2014  ? Unspecified vitamin D deficiency 01/07/2014  ? Left shoulder pain 08/10/2013  ? Chronic low back pain 08/10/2013  ? Essential hypertension 08/10/2013  ? Tobacco abuse 08/10/2013  ? ? ?REFERRING DIAG: Gait disturbance ? ?THERAPY DIAG:  ?Pain in left hip ? ?Difficulty in walking, not elsewhere classified ? ?Muscle weakness (generalized) ? ?SUBJECTIVE:  ?  ?SUBJECTIVE STATEMENT: ?Pt reports he is doing well today ?  ?PAIN:  ?Are you having pain? Yes ?NPRS scale: 5/10 wrose 8/10 ?Pain location: hip ?Pain orientation: left ?PAIN TYPE: aching ?Pain description: constant  ?Aggravating factors: how he sleeps, prolonged standing and walking ?Relieving factors: Rest, BC powders ? ?PERTINENT HISTORY: ?Arthritis, lumbar herniated disc, HTN, depression, anxiety ?  ?PRECAUTIONS: None ?  ?PATIENT GOALS To loosen up the L leg ?  ?  ?OBJECTIVE:  ?  ?DIAGNOSTIC FINDINGS: No recent imaging available ?  ?PATIENT SURVEYS:  ?NA due to short timetime of PT prior to upcoming L THR ?  ?COGNITION: ?         Overall cognitive status: Within functional limits  for tasks assessed              ?          ?SENSATION: ?         Light touch: Appears intact R LE numbness, LT but grossly intact  ?  ?POSTURE:  ?Due to R flexed knee, appearance of R lateral shift of shoulders on pelvis; forward flexed trunk, valgus both knee  ?  ?PALPATION: ?TTP to the L lateral hip ?  ?LE AROM/PROM: ?  ?A/PROM Right ?09/30/2021 Left ?09/30/2021  ?Hip flexion 85 70  ?Hip extension      ?Hip abduction 11 22  ?Hip adduction      ?Hip internal rotation 15 18  ?Hip external rotation      ?Knee flexion      ?Knee extension      ?Ankle dorsiflexion 3 4  ?Ankle plantarflexion      ?Ankle inversion       ?Ankle eversion      ? (Blank rows = not tested) ?  ?LE MMT: ?  ?MMT Right ?09/30/2021 Left ?09/30/2021  ?Hip flexion 4 4-  ?Hip extension 4 4-  ?Hip abduction 4 4-  ?Hip adduction 4 4-  ?Hip internal rotation 4 4-  ?Hip external rotation 4 4-  ?Knee flexion 5 5  ?Knee extension 5 5  ?Ankle dorsiflexion 4 4  ?Ankle plantarflexion 3 3  ?Ankle inversion      ?Ankle eversion      ? (Blank rows = not tested) ?  ?LOWER EXTREMITY SPECIAL TESTS:  ?Hip special tests: Saralyn Pilar (FABER) test: positive  ?  ?FUNCTIONAL TESTS:  ?5 times sit to stand: 17.3 ?  ?GAIT: ?Distance walked: 150 in clinic ?Assistive device utilized: Single point cane ?Level of assistance: Complete Independence ?Comments:narrow BOS, knee valgus with knees touching, decreased step length bilat  ?  ?Quick Balance: ?SLS- 3 sec bilat ?Narrow BOS- 1 min ?EC feet apart- 10 sec ? ?BERG BALANCE 10/08/21 ? ?Sitting to Standing: Numbers; 0-4: 4 ? 4. Stands without using hands and stabilize independently ? 3. Stands independently using hands ? 2. Stands using hands after multiple trials ? 1. Min A to stand ? 0. Mod-Max A to stand ?Standing unsupported: Numbers; 0-4: 4 ? 4. Stands safely for 2 minutes ? 3. Stands 2 minutes with supervision ? 2. Stands 30 seconds unsupported ? 1. Needs several tries to stand unsupported for 30 seconds ? 0. Unable to stand unsupported for 30 seconds ?Sitting unsupported: Numbers; 0-4: 4 ? 4. Sits for 2 minutes independently ? 3. Sits for 2 minutes with supervision ? 2. Able to sit 30 seconds ? 1. Able to sit 10 seconds ? 0. Unable to sit for 10 seconds ?Standing to Sitting: Numbers; 0-4: 4 ?4. Sits safely with minimal use of hands ?3. Controls descent with hands ?2. Uses back of legs against chair to control descent ?1. Sits independently, but uncontrolled descent ?0. Needs assistance ?Transfers: Numbers; 0-4: 3 ? 4. Transfers safely with minor use of hands ? 3. Transfers safely definite use of hands ? 2. Transfers with verbal  cueing/supervision ? 1. Needs 1 person assist ? 0. Needs 2 person assist  ?Standing with eyes closed: Numbers; 0-4: 4 ? 4. Stands safely for 10 seconds ? 3. Stands 10 seconds with supervision  ? 2. Able to stand for 3 seconds ? 1. Unable to keep eyes closed for 3 seconds, but is safe ? 0. Needs assist to keep from falling ?  Standing with feet together: Numbers; 0-4: 4 ?4. Stands for 1 minute safely ?3. Stands for 1 minute with supervision ?2. Unable to hold for 30 seconds ? 1. Needs help to attain position but can hold for 15 seconds ? 0. Needs help to attain position and unable to hold for 15 seconds ?Reaching forward with outstretched arm: Numbers; 0-4: 4 ? 4. Reaches forward 10 inches ? 3. Reaches forward 5 inches ? 2. Reaches forward 2 inches ? 1. Reaches forward with supervision ? 0. Loses balance/requires assistace ?Retrieving object from the floor: Numbers; 0-4: 4 ?4. Able to pick up easily and safely ?3. Able to pick up with supervision ?2. Unable to pick up, but reaches within 1-2 inches independently ?1. Unable to pick up and needs supervision ?0. Unable/needs assistance to keep from falling  ?Turning to look behind: Numbers; 0-4: 3 ? 4. Looks behind from both sides and weight shifts well ? 3. Looks behind one side only, other side less weight shift ? 2. Turns sideways only, maintains balance ? 1. Needs supervision when turning ? 0. Needs assistance  ?Turning 360 degrees: Numbers; 0-4: 3 ? 4. Able to turn in </=4 seconds ? 3. Able to turn on one side in </= 4 seconds  ? 2. Able to turn slowly, but safely ? 1. Needs supervision or verbal cueing ? 0. Needs assistance ?Place alternate foot on stool: Numbers; 0-4: 3 ?4. Completes 8 steps in 20 seconds ?3. Completes 8 steps in >20 seconds ?2. 4 steps without assistance/supervision ?1. Completes >2 steps with minimal assist ?0. Unable, needs assist to keep from falling ?Standing with one foot in front: Numbers; 0-4: 3 ? 4. Independent tandem for 30 seconds ? 3.  Independent foot ahead for 30 seconds ? 2. Independent small step for 30 seconds ? 1. Needs help to step, but can hold for 15 seconds ? 0. Loses balance while standing/stepping ?Standing on one foot: Numbers; 0-4: 3 ?4. H

## 2021-10-09 ENCOUNTER — Other Ambulatory Visit: Payer: Self-pay

## 2021-10-09 ENCOUNTER — Telehealth: Payer: Self-pay

## 2021-10-09 ENCOUNTER — Other Ambulatory Visit: Payer: Self-pay | Admitting: Internal Medicine

## 2021-10-09 DIAGNOSIS — R252 Cramp and spasm: Secondary | ICD-10-CM

## 2021-10-09 NOTE — Telephone Encounter (Signed)
Pt given lab results per notes of Dr. Wynetta Emery on 10/09/21. Pt verbalized understanding. ? ?

## 2021-10-09 NOTE — Telephone Encounter (Signed)
Requested medication (s) are due for refill today: yes ? ?Requested medication (s) are on the active medication list: yes ? ?Last refill:  09/23/21 #30 with 0 RD ? ?Future visit scheduled: no, seen 09/24/21, asked to return in 4 months, July. ? ?Notes to clinic:  This medication can not be delegated, please assess.  ? ? ? ?  ? ?Requested Prescriptions  ?Pending Prescriptions Disp Refills  ? cyclobenzaprine (FLEXERIL) 5 MG tablet 30 tablet 0  ?  Sig: Take 1 tablet (5 mg total) by mouth 2 (two) times daily as needed for muscle spasms.  ?  ? Not Delegated - Analgesics:  Muscle Relaxants Failed - 10/09/2021  4:15 PM  ?  ?  Failed - This refill cannot be delegated  ?  ?  Passed - Valid encounter within last 6 months  ?  Recent Outpatient Visits   ? ?      ? 2 weeks ago Encounter for [employer] annual wellness exam  ? [place] Ladell Pier, MD  ? 2 months ago Essential hypertension  ? [place] Ladell Pier, MD  ? 7 months ago Essential hypertension  ? [place] Ladell Pier, MD  ? 11 months ago Essential hypertension  ? [place] Ladell Pier, MD  ? 1 year ago Essential hypertension  ? [place] Ladell Pier, MD  ? ?  ?  ? ?  ?  ?  ? ? ?

## 2021-10-12 ENCOUNTER — Other Ambulatory Visit: Payer: Self-pay

## 2021-10-12 MED ORDER — CYCLOBENZAPRINE HCL 5 MG PO TABS
5.0000 mg | ORAL_TABLET | Freq: Two times a day (BID) | ORAL | 0 refills | Status: DC | PRN
  Filled 2021-10-12: qty 30, 15d supply, fill #0

## 2021-10-13 ENCOUNTER — Other Ambulatory Visit: Payer: Self-pay

## 2021-10-13 ENCOUNTER — Ambulatory Visit: Payer: Medicare Other

## 2021-10-13 DIAGNOSIS — M6281 Muscle weakness (generalized): Secondary | ICD-10-CM

## 2021-10-13 DIAGNOSIS — G8929 Other chronic pain: Secondary | ICD-10-CM

## 2021-10-13 DIAGNOSIS — R262 Difficulty in walking, not elsewhere classified: Secondary | ICD-10-CM | POA: Diagnosis not present

## 2021-10-13 DIAGNOSIS — M25552 Pain in left hip: Secondary | ICD-10-CM | POA: Diagnosis not present

## 2021-10-13 DIAGNOSIS — M545 Low back pain, unspecified: Secondary | ICD-10-CM | POA: Diagnosis not present

## 2021-10-13 DIAGNOSIS — R269 Unspecified abnormalities of gait and mobility: Secondary | ICD-10-CM | POA: Diagnosis not present

## 2021-10-13 DIAGNOSIS — M25652 Stiffness of left hip, not elsewhere classified: Secondary | ICD-10-CM

## 2021-10-13 NOTE — Therapy (Signed)
?OUTPATIENT PHYSICAL THERAPY TREATMENT NOTE ? ? ?Patient Name: Calvin [name] ?MRN: [id number] ?DOB:Jan 23, 1961, 61 y.o., male ?Today's Date: 10/13/2021 ? ?PCP: Ladell Pier, MD ?REFERRING PROVIDER: Ladell Pier, MD ? ? PT End of Session - 10/13/21 1418   ? ? Visit Number 3   ? Number of Visits 7   ? Date for PT Re-Evaluation 10/30/21   ? Authorization Type [employer] MEDICARE   ? PT Start Time 2683   ? PT Stop Time 1500   ? PT Time Calculation (min) 43 min   ? Activity Tolerance Patient tolerated treatment well   ? Behavior During Therapy [facility] for tasks assessed/performed   ? ?  ?  ? ?  ? ? ? ?Past Medical History:  ?Diagnosis Date  ? Anxiety   ? Arthritis   ? back, shoulders   ? Constipation   ? Depression   ? Disc disorder   ? Hypertension   ? Lumbar herniated disc   ? Muscle spasm   ? ?Past Surgical History:  ?Procedure Laterality Date  ? ANTERIOR CERVICAL DECOMP/DISCECTOMY FUSION N/A 03/08/2018  ? Procedure: ANTERIOR CERVICAL DECOMPRESSION/DISCECTOMY FUSION , cervical 3-4, cervical 4-5, cervical 5-6 with intrumentational and allograft;  Surgeon: Phylliss Bob, MD;  Location: [place];  Service: Orthopedics;  Laterality: N/A;  ? BONE BIOPSY Right 05/17/2018  ? Procedure: BONE BIOPSY X2;  Surgeon: Evelina Bucy, DPM;  Location: [facility];  Service: Podiatry;  Laterality: Right;  right second toe  ? COLONOSCOPY    ? FOOT SURGERY    ? right, ~ 2002  ? POLYPECTOMY    ? ?Patient Active Problem List  ? Diagnosis Date Noted  ? Hepatitis C virus infection cured after antiviral drug therapy 09/24/2021  ? Chronic hepatitis C ([place]) 09/17/2021  ? Hyperlipidemia 08/05/2021  ? CKD (chronic kidney disease) stage 3, GFR 30-59 ml/min (HCC) 08/05/2021  ? Insomnia 08/03/2021  ? Tobacco dependence 08/03/2021  ? Opioid use disorder in remission 08/03/2021  ? Severe sepsis ([place]) 08/07/2019  ? Lobar pneumonia ([place]) 08/07/2019  ? Diarrhea 08/07/2019  ? Anemia 08/07/2019  ? Foot laceration, right, initial encounter   ?  Radiculopathy 03/08/2018  ? Tubular adenoma 05/16/2017  ? Depressed mood 05/16/2017  ? Substance abuse ([place]) 05/16/2017  ? Hallux valgus 08/03/2016  ? Disc disorder   ? Lumbar facet arthropathy 08/06/2014  ? Lumbar radiculopathy 08/06/2014  ? ED (erectile dysfunction) 01/07/2014  ? Unspecified vitamin D deficiency 01/07/2014  ? Left shoulder pain 08/10/2013  ? Chronic low back pain 08/10/2013  ? Essential hypertension 08/10/2013  ? Tobacco abuse 08/10/2013  ? ? ?REFERRING DIAG: Gait disturbance ? ?THERAPY DIAG:  ?Pain in left hip ? ?Difficulty in walking, not elsewhere classified ? ?Muscle weakness (generalized) ? ?Chronic left-sided low back pain without sciatica ? ?Stiffness of left hip, not elsewhere classified ? ?SUBJECTIVE:  ?  ?SUBJECTIVE STATEMENT: ?Pt reports he has been completing his HEP. ?  ?PAIN:  ?Are you having pain? Yes ?NPRS scale: 4/10 wrose 8/10 ?Pain location: hip ?Pain orientation: left ?PAIN TYPE: aching ?Pain description: constant  ?Aggravating factors: how he sleeps, prolonged standing and walking ?Relieving factors: Rest, BC powders ? ?PERTINENT HISTORY: ?Arthritis, lumbar herniated disc, HTN, depression, anxiety ?  ?PRECAUTIONS: None ?  ?PATIENT GOALS To loosen up the L leg ?  ?  ?OBJECTIVE:  ?  ?DIAGNOSTIC FINDINGS: No recent imaging available ?  ?PATIENT SURVEYS:  ?NA due to short timetime of PT prior to upcoming L THR ?  ?  COGNITION: ?         Overall cognitive status: Within functional limits for tasks assessed              ?          ?SENSATION: ?         Light touch: Appears intact R LE numbness, LT but grossly intact  ?  ?POSTURE:  ?Due to R flexed knee, appearance of R lateral shift of shoulders on pelvis; forward flexed trunk, valgus both knee  ?  ?PALPATION: ?TTP to the L lateral hip ?  ?LE AROM/PROM: ?  ?A/PROM Right ?09/30/2021 Left ?09/30/2021  ?Hip flexion 85 70  ?Hip extension      ?Hip abduction 11 22  ?Hip adduction      ?Hip internal rotation 15 18  ?Hip external rotation       ?Knee flexion      ?Knee extension      ?Ankle dorsiflexion 3 4  ?Ankle plantarflexion      ?Ankle inversion      ?Ankle eversion      ? (Blank rows = not tested) ?  ?LE MMT: ?  ?MMT Right ?09/30/2021 Left ?09/30/2021  ?Hip flexion 4 4-  ?Hip extension 4 4-  ?Hip abduction 4 4-  ?Hip adduction 4 4-  ?Hip internal rotation 4 4-  ?Hip external rotation 4 4-  ?Knee flexion 5 5  ?Knee extension 5 5  ?Ankle dorsiflexion 4 4  ?Ankle plantarflexion 3 3  ?Ankle inversion      ?Ankle eversion      ? (Blank rows = not tested) ?  ?LOWER EXTREMITY SPECIAL TESTS:  ?Hip special tests: Saralyn Pilar (FABER) test: positive  ?  ?FUNCTIONAL TESTS:  ?5 times sit to stand: 17.3 ?  ?GAIT: ?Distance walked: 150 in clinic ?Assistive device utilized: Single point cane ?Level of assistance: Complete Independence ?Comments:narrow BOS, knee valgus with knees touching, decreased step length bilat  ?  ?Quick Balance: ?SLS- 3 sec bilat ?Narrow BOS- 1 min ?EC feet apart- 10 sec ? ?BERG BALANCE 10/08/21 ? ?Sitting to Standing: Numbers; 0-4: 4 ? 4. Stands without using hands and stabilize independently ? 3. Stands independently using hands ? 2. Stands using hands after multiple trials ? 1. Min A to stand ? 0. Mod-Max A to stand ?Standing unsupported: Numbers; 0-4: 4 ? 4. Stands safely for 2 minutes ? 3. Stands 2 minutes with supervision ? 2. Stands 30 seconds unsupported ? 1. Needs several tries to stand unsupported for 30 seconds ? 0. Unable to stand unsupported for 30 seconds ?Sitting unsupported: Numbers; 0-4: 4 ? 4. Sits for 2 minutes independently ? 3. Sits for 2 minutes with supervision ? 2. Able to sit 30 seconds ? 1. Able to sit 10 seconds ? 0. Unable to sit for 10 seconds ?Standing to Sitting: Numbers; 0-4: 4 ?4. Sits safely with minimal use of hands ?3. Controls descent with hands ?2. Uses back of legs against chair to control descent ?1. Sits independently, but uncontrolled descent ?0. Needs assistance ?Transfers: Numbers; 0-4: 3 ? 4. Transfers  safely with minor use of hands ? 3. Transfers safely definite use of hands ? 2. Transfers with verbal cueing/supervision ? 1. Needs 1 person assist ? 0. Needs 2 person assist  ?Standing with eyes closed: Numbers; 0-4: 4 ? 4. Stands safely for 10 seconds ? 3. Stands 10 seconds with supervision  ? 2. Able to stand for 3 seconds ? 1. Unable to keep  eyes closed for 3 seconds, but is safe ? 0. Needs assist to keep from falling ?Standing with feet together: Numbers; 0-4: 4 ?4. Stands for 1 minute safely ?3. Stands for 1 minute with supervision ?2. Unable to hold for 30 seconds ? 1. Needs help to attain position but can hold for 15 seconds ? 0. Needs help to attain position and unable to hold for 15 seconds ?Reaching forward with outstretched arm: Numbers; 0-4: 4 ? 4. Reaches forward 10 inches ? 3. Reaches forward 5 inches ? 2. Reaches forward 2 inches ? 1. Reaches forward with supervision ? 0. Loses balance/requires assistace ?Retrieving object from the floor: Numbers; 0-4: 4 ?4. Able to pick up easily and safely ?3. Able to pick up with supervision ?2. Unable to pick up, but reaches within 1-2 inches independently ?1. Unable to pick up and needs supervision ?0. Unable/needs assistance to keep from falling  ?Turning to look behind: Numbers; 0-4: 3 ? 4. Looks behind from both sides and weight shifts well ? 3. Looks behind one side only, other side less weight shift ? 2. Turns sideways only, maintains balance ? 1. Needs supervision when turning ? 0. Needs assistance  ?Turning 360 degrees: Numbers; 0-4: 3 ? 4. Able to turn in </=4 seconds ? 3. Able to turn on one side in </= 4 seconds  ? 2. Able to turn slowly, but safely ? 1. Needs supervision or verbal cueing ? 0. Needs assistance ?Place alternate foot on stool: Numbers; 0-4: 3 ?4. Completes 8 steps in 20 seconds ?3. Completes 8 steps in >20 seconds ?2. 4 steps without assistance/supervision ?1. Completes >2 steps with minimal assist ?0. Unable, needs assist to keep from  falling ?Standing with one foot in front: Numbers; 0-4: 3 ? 4. Independent tandem for 30 seconds ? 3. Independent foot ahead for 30 seconds ? 2. Independent small step for 30 seconds ? 1. Needs help to step,

## 2021-10-15 ENCOUNTER — Other Ambulatory Visit: Payer: Self-pay

## 2021-10-15 ENCOUNTER — Ambulatory Visit: Payer: Medicare Other

## 2021-10-15 DIAGNOSIS — R262 Difficulty in walking, not elsewhere classified: Secondary | ICD-10-CM

## 2021-10-15 DIAGNOSIS — M545 Low back pain, unspecified: Secondary | ICD-10-CM | POA: Diagnosis not present

## 2021-10-15 DIAGNOSIS — M25552 Pain in left hip: Secondary | ICD-10-CM

## 2021-10-15 DIAGNOSIS — M25652 Stiffness of left hip, not elsewhere classified: Secondary | ICD-10-CM | POA: Diagnosis not present

## 2021-10-15 DIAGNOSIS — M6281 Muscle weakness (generalized): Secondary | ICD-10-CM | POA: Diagnosis not present

## 2021-10-15 DIAGNOSIS — G8929 Other chronic pain: Secondary | ICD-10-CM | POA: Diagnosis not present

## 2021-10-15 DIAGNOSIS — R269 Unspecified abnormalities of gait and mobility: Secondary | ICD-10-CM | POA: Diagnosis not present

## 2021-10-15 NOTE — Therapy (Signed)
?OUTPATIENT PHYSICAL THERAPY TREATMENT NOTE ? ? ?Patient Name: Calvin [name] ?MRN: [id number] ?DOB:June 12, 1961, 61 y.o., male ?Today's Date: 10/15/2021 ? ?PCP: Ladell Pier, MD ?REFERRING PROVIDER: Ladell Pier, MD ? ? PT End of Session - 10/15/21 1427   ? ? Visit Number 4   ? Number of Visits 7   ? Date for PT Re-Evaluation 10/30/21   ? Authorization Type [employer] MEDICARE   ? PT Start Time 1660   ? PT Stop Time 1505   ? PT Time Calculation (min) 43 min   ? Activity Tolerance Patient tolerated treatment well   ? Behavior During Therapy [facility] for tasks assessed/performed   ? ?  ?  ? ?  ? ? ? ? ?Past Medical History:  ?Diagnosis Date  ? Anxiety   ? Arthritis   ? back, shoulders   ? Constipation   ? Depression   ? Disc disorder   ? Hypertension   ? Lumbar herniated disc   ? Muscle spasm   ? ?Past Surgical History:  ?Procedure Laterality Date  ? ANTERIOR CERVICAL DECOMP/DISCECTOMY FUSION N/A 03/08/2018  ? Procedure: ANTERIOR CERVICAL DECOMPRESSION/DISCECTOMY FUSION , cervical 3-4, cervical 4-5, cervical 5-6 with intrumentational and allograft;  Surgeon: Phylliss Bob, MD;  Location: [place];  Service: Orthopedics;  Laterality: N/A;  ? BONE BIOPSY Right 05/17/2018  ? Procedure: BONE BIOPSY X2;  Surgeon: Evelina Bucy, DPM;  Location: [facility];  Service: Podiatry;  Laterality: Right;  right second toe  ? COLONOSCOPY    ? FOOT SURGERY    ? right, ~ 2002  ? POLYPECTOMY    ? ?Patient Active Problem List  ? Diagnosis Date Noted  ? Hepatitis C virus infection cured after antiviral drug therapy 09/24/2021  ? Chronic hepatitis C ([place]) 09/17/2021  ? Hyperlipidemia 08/05/2021  ? CKD (chronic kidney disease) stage 3, GFR 30-59 ml/min (HCC) 08/05/2021  ? Insomnia 08/03/2021  ? Tobacco dependence 08/03/2021  ? Opioid use disorder in remission 08/03/2021  ? Severe sepsis ([place]) 08/07/2019  ? Lobar pneumonia ([place]) 08/07/2019  ? Diarrhea 08/07/2019  ? Anemia 08/07/2019  ? Foot laceration, right, initial encounter   ?  Radiculopathy 03/08/2018  ? Tubular adenoma 05/16/2017  ? Depressed mood 05/16/2017  ? Substance abuse ([place]) 05/16/2017  ? Hallux valgus 08/03/2016  ? Disc disorder   ? Lumbar facet arthropathy 08/06/2014  ? Lumbar radiculopathy 08/06/2014  ? ED (erectile dysfunction) 01/07/2014  ? Unspecified vitamin D deficiency 01/07/2014  ? Left shoulder pain 08/10/2013  ? Chronic low back pain 08/10/2013  ? Essential hypertension 08/10/2013  ? Tobacco abuse 08/10/2013  ? ? ?REFERRING DIAG: Gait disturbance ? ?THERAPY DIAG:  ?Pain in left hip ? ?Difficulty in walking, not elsewhere classified ? ?Muscle weakness (generalized) ? ?SUBJECTIVE:  ?  ?SUBJECTIVE STATEMENT: ?Pt reports some leg soreness with his HEP.  ?  ?PAIN:  ?Are you having pain? Yes ?NPRS scale: 4/10 wrose 8/10 ?Pain location: hip ?Pain orientation: left ?PAIN TYPE: aching ?Pain description: constant  ?Aggravating factors: how he sleeps, prolonged standing and walking ?Relieving factors: Rest, BC powders ? ?PERTINENT HISTORY: ?Arthritis, lumbar herniated disc, HTN, depression, anxiety ?  ?PRECAUTIONS: None ?  ?PATIENT GOALS To loosen up the L leg ?  ?  ?OBJECTIVE:  ?  ?DIAGNOSTIC FINDINGS: No recent imaging available ?  ?PATIENT SURVEYS:  ?NA due to short timetime of PT prior to upcoming L THR ?  ?COGNITION: ?         Overall cognitive  status: Within functional limits for tasks assessed              ?          ?SENSATION: ?         Light touch: Appears intact R LE numbness, LT but grossly intact  ?  ?POSTURE:  ?Due to R flexed knee, appearance of R lateral shift of shoulders on pelvis; forward flexed trunk, valgus both knee  ?  ?PALPATION: ?TTP to the L lateral hip ?  ?LE AROM/PROM: ?  ?A/PROM Right ?09/30/2021 Left ?09/30/2021  ?Hip flexion 85 70  ?Hip extension      ?Hip abduction 11 22  ?Hip adduction      ?Hip internal rotation 15 18  ?Hip external rotation      ?Knee flexion      ?Knee extension      ?Ankle dorsiflexion 3 4  ?Ankle plantarflexion      ?Ankle  inversion      ?Ankle eversion      ? (Blank rows = not tested) ?  ?LE MMT: ?  ?MMT Right ?09/30/2021 Left ?09/30/2021  ?Hip flexion 4 4-  ?Hip extension 4 4-  ?Hip abduction 4 4-  ?Hip adduction 4 4-  ?Hip internal rotation 4 4-  ?Hip external rotation 4 4-  ?Knee flexion 5 5  ?Knee extension 5 5  ?Ankle dorsiflexion 4 4  ?Ankle plantarflexion 3 3  ?Ankle inversion      ?Ankle eversion      ? (Blank rows = not tested) ?  ?LOWER EXTREMITY SPECIAL TESTS:  ?Hip special tests: Saralyn Pilar (FABER) test: positive  ?  ?FUNCTIONAL TESTS:  ?5 times sit to stand: 17.3 ?  ?GAIT: ?Distance walked: 150 in clinic ?Assistive device utilized: Single point cane ?Level of assistance: Complete Independence ?Comments:narrow BOS, knee valgus with knees touching, decreased step length bilat  ?  ?Quick Balance: ?SLS- 3 sec bilat ?Narrow BOS- 1 min ?EC feet apart- 10 sec ? ?BERG BALANCE 10/08/21 ? ?Sitting to Standing: Numbers; 0-4: 4 ? 4. Stands without using hands and stabilize independently ? 3. Stands independently using hands ? 2. Stands using hands after multiple trials ? 1. Min A to stand ? 0. Mod-Max A to stand ?Standing unsupported: Numbers; 0-4: 4 ? 4. Stands safely for 2 minutes ? 3. Stands 2 minutes with supervision ? 2. Stands 30 seconds unsupported ? 1. Needs several tries to stand unsupported for 30 seconds ? 0. Unable to stand unsupported for 30 seconds ?Sitting unsupported: Numbers; 0-4: 4 ? 4. Sits for 2 minutes independently ? 3. Sits for 2 minutes with supervision ? 2. Able to sit 30 seconds ? 1. Able to sit 10 seconds ? 0. Unable to sit for 10 seconds ?Standing to Sitting: Numbers; 0-4: 4 ?4. Sits safely with minimal use of hands ?3. Controls descent with hands ?2. Uses back of legs against chair to control descent ?1. Sits independently, but uncontrolled descent ?0. Needs assistance ?Transfers: Numbers; 0-4: 3 ? 4. Transfers safely with minor use of hands ? 3. Transfers safely definite use of hands ? 2. Transfers with verbal  cueing/supervision ? 1. Needs 1 person assist ? 0. Needs 2 person assist  ?Standing with eyes closed: Numbers; 0-4: 4 ? 4. Stands safely for 10 seconds ? 3. Stands 10 seconds with supervision  ? 2. Able to stand for 3 seconds ? 1. Unable to keep eyes closed for 3 seconds, but is safe ? 0. Needs assist  to keep from falling ?Standing with feet together: Numbers; 0-4: 4 ?4. Stands for 1 minute safely ?3. Stands for 1 minute with supervision ?2. Unable to hold for 30 seconds ? 1. Needs help to attain position but can hold for 15 seconds ? 0. Needs help to attain position and unable to hold for 15 seconds ?Reaching forward with outstretched arm: Numbers; 0-4: 4 ? 4. Reaches forward 10 inches ? 3. Reaches forward 5 inches ? 2. Reaches forward 2 inches ? 1. Reaches forward with supervision ? 0. Loses balance/requires assistace ?Retrieving object from the floor: Numbers; 0-4: 4 ?4. Able to pick up easily and safely ?3. Able to pick up with supervision ?2. Unable to pick up, but reaches within 1-2 inches independently ?1. Unable to pick up and needs supervision ?0. Unable/needs assistance to keep from falling  ?Turning to look behind: Numbers; 0-4: 3 ? 4. Looks behind from both sides and weight shifts well ? 3. Looks behind one side only, other side less weight shift ? 2. Turns sideways only, maintains balance ? 1. Needs supervision when turning ? 0. Needs assistance  ?Turning 360 degrees: Numbers; 0-4: 3 ? 4. Able to turn in </=4 seconds ? 3. Able to turn on one side in </= 4 seconds  ? 2. Able to turn slowly, but safely ? 1. Needs supervision or verbal cueing ? 0. Needs assistance ?Place alternate foot on stool: Numbers; 0-4: 3 ?4. Completes 8 steps in 20 seconds ?3. Completes 8 steps in >20 seconds ?2. 4 steps without assistance/supervision ?1. Completes >2 steps with minimal assist ?0. Unable, needs assist to keep from falling ?Standing with one foot in front: Numbers; 0-4: 3 ? 4. Independent tandem for 30 seconds ? 3.  Independent foot ahead for 30 seconds ? 2. Independent small step for 30 seconds ? 1. Needs help to step, but can hold for 15 seconds ? 0. Loses balance while standing/stepping ?Standing on one foot: Numbers

## 2021-10-19 ENCOUNTER — Ambulatory Visit: Payer: Medicare Other | Admitting: Physical Therapy

## 2021-10-19 ENCOUNTER — Other Ambulatory Visit: Payer: Self-pay

## 2021-10-19 ENCOUNTER — Encounter: Payer: Self-pay | Admitting: Physical Therapy

## 2021-10-19 DIAGNOSIS — G8929 Other chronic pain: Secondary | ICD-10-CM | POA: Diagnosis not present

## 2021-10-19 DIAGNOSIS — M25652 Stiffness of left hip, not elsewhere classified: Secondary | ICD-10-CM | POA: Diagnosis not present

## 2021-10-19 DIAGNOSIS — R262 Difficulty in walking, not elsewhere classified: Secondary | ICD-10-CM

## 2021-10-19 DIAGNOSIS — R269 Unspecified abnormalities of gait and mobility: Secondary | ICD-10-CM | POA: Diagnosis not present

## 2021-10-19 DIAGNOSIS — M25552 Pain in left hip: Secondary | ICD-10-CM | POA: Diagnosis not present

## 2021-10-19 DIAGNOSIS — M545 Low back pain, unspecified: Secondary | ICD-10-CM | POA: Diagnosis not present

## 2021-10-19 DIAGNOSIS — M6281 Muscle weakness (generalized): Secondary | ICD-10-CM | POA: Diagnosis not present

## 2021-10-19 DIAGNOSIS — Z8679 Personal history of other diseases of the circulatory system: Secondary | ICD-10-CM | POA: Insufficient documentation

## 2021-10-19 NOTE — Therapy (Addendum)
OUTPATIENT PHYSICAL THERAPY TREATMENT NOTE/Discharge   Patient Name: Calvin [name] MRN: [id number] DOB:May 26, 1961, 61 y.o., male Today's Date: 10/19/2021  PCP: Ladell Pier, MD REFERRING PROVIDER: Ladell Pier, MD   PT End of Session - 10/19/21 1349     Visit Number 5    Number of Visits 7    Date for PT Re-Evaluation 10/30/21    Authorization Type [employer] MEDICARE    PT Start Time 8676    PT Stop Time 1415    PT Time Calculation (min) 30 min               Past Medical History:  Diagnosis Date   Anxiety    Arthritis    back, shoulders    Constipation    Depression    Disc disorder    Hypertension    Lumbar herniated disc    Muscle spasm    Past Surgical History:  Procedure Laterality Date   ANTERIOR CERVICAL DECOMP/DISCECTOMY FUSION N/A 03/08/2018   Procedure: ANTERIOR CERVICAL DECOMPRESSION/DISCECTOMY FUSION , cervical 3-4, cervical 4-5, cervical 5-6 with intrumentational and allograft;  Surgeon: Phylliss Bob, MD;  Location: [facility];  Service: Orthopedics;  Laterality: N/A;   BONE BIOPSY Right 05/17/2018   Procedure: BONE BIOPSY X2;  Surgeon: Evelina Bucy, DPM;  Location: [facility];  Service: Podiatry;  Laterality: Right;  right second toe   COLONOSCOPY     FOOT SURGERY     right, ~ 2002   POLYPECTOMY     Patient Active Problem List   Diagnosis Date Noted   Hepatitis C virus infection cured after antiviral drug therapy 09/24/2021   Chronic hepatitis C ([place]) 09/17/2021   Hyperlipidemia 08/05/2021   CKD (chronic kidney disease) stage 3, GFR 30-59 ml/min (HCC) 08/05/2021   Insomnia 08/03/2021   Tobacco dependence 08/03/2021   Opioid use disorder in remission 08/03/2021   Severe sepsis ([place]) 08/07/2019   Lobar pneumonia ([place]) 08/07/2019   Diarrhea 08/07/2019   Anemia 08/07/2019   Foot laceration, right, initial encounter    Radiculopathy 03/08/2018   Tubular adenoma 05/16/2017   Depressed mood 05/16/2017   Substance abuse ([place])  05/16/2017   Hallux valgus 08/03/2016   Disc disorder    Lumbar facet arthropathy 08/06/2014   Lumbar radiculopathy 08/06/2014   ED (erectile dysfunction) 01/07/2014   Unspecified vitamin D deficiency 01/07/2014   Left shoulder pain 08/10/2013   Chronic low back pain 08/10/2013   Essential hypertension 08/10/2013   Tobacco abuse 08/10/2013    REFERRING DIAG: Gait disturbance  THERAPY DIAG:  Pain in left hip  Difficulty in walking, not elsewhere classified  Muscle weakness (generalized)  SUBJECTIVE:    SUBJECTIVE STATEMENT: Pt reports he was walking yesterday with ankle weights on to strengthen his legs. He does not have any pain today.   PAIN:  Are you having pain? Yes NPRS scale: 0/10 wrose 8/10 Pain location: hip Pain orientation: left PAIN TYPE: aching Pain description: constant  Aggravating factors: how he sleeps, prolonged standing and walking Relieving factors: Rest, BC powders  PERTINENT HISTORY: Arthritis, lumbar herniated disc, HTN, depression, anxiety   PRECAUTIONS: None   PATIENT GOALS To loosen up the L leg     OBJECTIVE:    DIAGNOSTIC FINDINGS: No recent imaging available   PATIENT SURVEYS:  NA due to short timetime of PT prior to upcoming L THR   COGNITION:          Overall cognitive status: Within functional limits for tasks assessed  SENSATION:          Light touch: Appears intact R LE numbness, LT but grossly intact    POSTURE:  Due to R flexed knee, appearance of R lateral shift of shoulders on pelvis; forward flexed trunk, valgus both knee    PALPATION: TTP to the L lateral hip   LE AROM/PROM:   A/PROM Right 09/30/2021 Left 09/30/2021 Right 10/19/21 Left  10/19/21  Hip flexion 85 70    Hip extension        Hip abduction 11 22    Hip adduction        Hip internal rotation 15 18    Hip external rotation        Knee flexion        Knee extension        Ankle dorsiflexion '3 4 5 5  ' Ankle plantarflexion         Ankle inversion        Ankle eversion         (Blank rows = not tested)   LE MMT:   MMT Right 09/30/2021 Left 09/30/2021 Right 10/19/21 Left 10/19/21  Hip flexion 4 4- 4+/5 4+/5  Hip extension 4 4-    Hip abduction 4 4-  4/5  Hip adduction 4 4-  4/5  Hip internal rotation 4 4-  4/5  Hip external rotation 4 4-  4/5  Knee flexion 5 5    Knee extension 5 5    Ankle dorsiflexion 4 4    Ankle plantarflexion 3 3    Ankle inversion        Ankle eversion         (Blank rows = not tested)   LOWER EXTREMITY SPECIAL TESTS:  Hip special tests: Saralyn Pilar (FABER) test: positive    FUNCTIONAL TESTS:  5 times sit to stand: 17.3 10/19/21: 10.7 sec   GAIT: Distance walked: 150 in clinic Assistive device utilized: Single point cane Level of assistance: Complete Independence Comments:narrow BOS, knee valgus with knees touching, decreased step length bilat    Quick Balance: SLS- 3 sec bilat Narrow BOS- 1 min EC feet apart- 10 sec  BERG BALANCE 10/08/21  Sitting to Standing: Numbers; 0-4: 4  4. Stands without using hands and stabilize independently  3. Stands independently using hands  2. Stands using hands after multiple trials  1. Min A to stand  0. Mod-Max A to stand Standing unsupported: Numbers; 0-4: 4  4. Stands safely for 2 minutes  3. Stands 2 minutes with supervision  2. Stands 30 seconds unsupported  1. Needs several tries to stand unsupported for 30 seconds  0. Unable to stand unsupported for 30 seconds Sitting unsupported: Numbers; 0-4: 4  4. Sits for 2 minutes independently  3. Sits for 2 minutes with supervision  2. Able to sit 30 seconds  1. Able to sit 10 seconds  0. Unable to sit for 10 seconds Standing to Sitting: Numbers; 0-4: 4 4. Sits safely with minimal use of hands 3. Controls descent with hands 2. Uses back of legs against chair to control descent 1. Sits independently, but uncontrolled descent 0. Needs assistance Transfers: Numbers; 0-4: 3  4. Transfers  safely with minor use of hands  3. Transfers safely definite use of hands  2. Transfers with verbal cueing/supervision  1. Needs 1 person assist  0. Needs 2 person assist  Standing with eyes closed: Numbers; 0-4: 4  4. Stands safely for 10 seconds  3. Stands  10 seconds with supervision   2. Able to stand for 3 seconds  1. Unable to keep eyes closed for 3 seconds, but is safe  0. Needs assist to keep from falling Standing with feet together: Numbers; 0-4: 4 4. Stands for 1 minute safely 3. Stands for 1 minute with supervision 2. Unable to hold for 30 seconds  1. Needs help to attain position but can hold for 15 seconds  0. Needs help to attain position and unable to hold for 15 seconds Reaching forward with outstretched arm: Numbers; 0-4: 4  4. Reaches forward 10 inches  3. Reaches forward 5 inches  2. Reaches forward 2 inches  1. Reaches forward with supervision  0. Loses balance/requires assistace Retrieving object from the floor: Numbers; 0-4: 4 4. Able to pick up easily and safely 3. Able to pick up with supervision 2. Unable to pick up, but reaches within 1-2 inches independently 1. Unable to pick up and needs supervision 0. Unable/needs assistance to keep from falling  Turning to look behind: Numbers; 0-4: 3  4. Looks behind from both sides and weight shifts well  3. Looks behind one side only, other side less weight shift  2. Turns sideways only, maintains balance  1. Needs supervision when turning  0. Needs assistance  Turning 360 degrees: Numbers; 0-4: 3  4. Able to turn in </=4 seconds  3. Able to turn on one side in </= 4 seconds   2. Able to turn slowly, but safely  1. Needs supervision or verbal cueing  0. Needs assistance Place alternate foot on stool: Numbers; 0-4: 3 4. Completes 8 steps in 20 seconds 3. Completes 8 steps in >20 seconds 2. 4 steps without assistance/supervision 1. Completes >2 steps with minimal assist 0. Unable, needs assist to keep from  falling Standing with one foot in front: Numbers; 0-4: 3  4. Independent tandem for 30 seconds  3. Independent foot ahead for 30 seconds  2. Independent small step for 30 seconds  1. Needs help to step, but can hold for 15 seconds  0. Loses balance while standing/stepping Standing on one foot: Numbers; 0-4: 3 4. Holds >10 seconds 3. Holds 5-10 seconds 2. Holds >/=3 seconds  1. Holds <3 seconds 0. Unable   Total Score: 50/56    TODAY'S TREATMENT: [facility] Adult PT Treatment:                                                DATE: 10/19/21 Therapeutic Exercise: Nu Step L5 5 mins Seated hamstring 2x20" each SKTC 2x20" each SLR x 15 each Bridge x 10 Supine clams 2x15 BluTB Piriformis 2x20" each Figure 4 stretch 2 c 20 Sec Supine add sets x15 Standing DF/PF 2x15 STS x 5 , 14 sec  [facility] Adult PT Treatment:                                                DATE: 10/15/21 Therapeutic Exercise: Nu Step L5 5 mins Gastroc stretch 2x20" each Seated hamstring 2x20" each SKTC 2x20" each Piriformis 2x20" each Figure 4 2x20 each LAQ 2x10 4# Bridging 2x10 8# SLR 2x15 4# Supine clams 2x15 BluTB Supine add sets x15 Standing DF/PF 2x15 updated a  HEP  [facility] Adult PT Treatment:                                                DATE: 10/13/21 Therapeutic Exercise: Nu Step L5 5 mins Gastroc stretch 3x20" each Seated hamstring 3x20" each SKTC 3x20" each Piriformis 2x20" each Figure 4 2x20 each LAQ 2x10 4# Bridging 2x10 SLR 2x10 Supine clams 2x15 BluTB Supine add sets 2x10 updated HEP updated a HEP    PATIENT EDUCATION:  Education details: Eval findings, POC  Person educated: Patient Education method: Explanation Education comprehension: verbalized understanding     HOME EXERCISE PROGRAM: Access Code: [email or web address] URL: [email or web address] Date: 10/13/2021 Prepared by: Gar Ponto  Exercises Seated Long Arc Quad - 1 x daily - 7 x weekly - 3 sets - 10 reps - 3 hold Supine  Bridge - 1 x daily - 7 x weekly - 3 sets - 10 reps - 3 hold Supine Straight Leg Raises - 1 x daily - 7 x weekly - 3 sets - 10 reps - 3 hold Hooklying Clamshell with Resistance - 1 x daily - 7 x weekly - 3 sets - 10 reps - 3 hold Supine Hip Adduction Isometric with Ball - 1 x daily - 7 x weekly - 3 sets - 10 reps - 3 hold Gastroc Stretch on Wall - 1 x daily - 7 x weekly - 1 sets - 3 reps - 20 hold Seated Hamstring Stretch - 1 x daily - 7 x weekly - 1 sets - 3 reps - 20 hold Hooklying Single Knee to Chest - 1 x daily - 7 x weekly - 1 sets - 3 reps - 20 hold Supine Piriformis Stretch with Foot on Ground - 1 x daily - 7 x weekly - 1 sets - 3 reps - 20 hold Supine Figure 4 Piriformis Stretch - 1 x daily - 7 x weekly - 1 sets - 3 reps - 20 hold   ASSESSMENT:   CLINICAL IMPRESSION: \Pt arrives 30 minutes late today and thought his appt was a later time. He was seen for a shorter session to check goals and DC to HEP. His has met his MMT goal. His DF has improved however limited to 5/10 bilateral. His 5 x STS has improved to goal. He has met most LTGS and is agreeable to discharge. He will have THA in 3 days. He reports feeling stronger and ready for his THA surgery.  Pt participated in PT to address hip ROM, LE flexibility and hip/LE strengthening. Pt is Ind with an initial HEP. Pt tolerated PT today without adverse effects    OBJECTIVE IMPAIRMENTS Abnormal gait, decreased activity tolerance, decreased balance, difficulty walking, decreased ROM, decreased strength, impaired flexibility, postural dysfunction, and pain.     GOALS:   SHORT TERM GOALS = LTGs   LONG TERM GOALS:   Ind in a HEP to maintain achieved LOF. 10/15/21 Baseline:  Target date: 10/30/21 Goal status: MET   2.  Decrease 5xSTS to 14 sec of less for improved function Baseline: 17.3 Target date: 10/30/21 Goal status: MET 10/19/21    3.  Increase ankle DF to 10d for  improved gait Baseline: 3,4  Target date: 10/30/21 Goal status:  NOT MET: 5d bilateral 10/19/21   4.  Asess Berg and set goal. 3/16//23= Assessed berg and balance was found appropriate considering the  issues the pt has with his R hip. Baseline: 50/56 Target date: 10/30/21 Goal status: MET   5. Increase L hip strength to 4/5 prior to [facility]           Baseline: 4-/5           Target Date: 10/30/21           Goal Status: MET 4/5, 10/19/21   PLAN: PT FREQUENCY: 2x/week   PT DURATION: 3 weeks   PLANNED INTERVENTIONS: Therapeutic exercises, Therapeutic activity, Neuromuscular re-education, Balance training, Gait training, Patient/Family education, Joint mobilization, Stair training, Cryotherapy, Moist heat, and Manual therapy   PLAN FOR NEXT SESSION: N/A , discharge to HEP today.     Hessie Diener, PTA 10/19/21 2:18 PM Phone: [phone] Fax: [phone]   PHYSICAL THERAPY DISCHARGE SUMMARY  Visits from Start of Care: 5  Current functional level related to goals / functional outcomes: See impression and LTGs   Remaining deficits: See impression and LTGs   Education / Equipment: HEP   Patient agrees to discharge. Patient goals were met. Patient is being discharged due to being pleased with the current functional level.  Allen Ralls MS, PT 01/06/22 9:03 AM

## 2021-10-21 ENCOUNTER — Ambulatory Visit: Payer: Medicare Other

## 2021-10-21 DIAGNOSIS — Z20822 Contact with and (suspected) exposure to covid-19: Secondary | ICD-10-CM | POA: Diagnosis not present

## 2021-10-21 DIAGNOSIS — Z01818 Encounter for other preprocedural examination: Secondary | ICD-10-CM | POA: Diagnosis not present

## 2021-10-21 DIAGNOSIS — M1612 Unilateral primary osteoarthritis, left hip: Secondary | ICD-10-CM | POA: Diagnosis not present

## 2021-10-21 DIAGNOSIS — N183 Chronic kidney disease, stage 3 unspecified: Secondary | ICD-10-CM | POA: Diagnosis not present

## 2021-10-28 ENCOUNTER — Other Ambulatory Visit: Payer: Self-pay

## 2021-10-28 ENCOUNTER — Telehealth: Payer: Self-pay | Admitting: Internal Medicine

## 2021-10-28 DIAGNOSIS — N528 Other male erectile dysfunction: Secondary | ICD-10-CM

## 2021-10-28 MED ORDER — SILDENAFIL CITRATE 100 MG PO TABS
ORAL_TABLET | ORAL | 4 refills | Status: DC
  Filled 2021-10-28 – 2021-11-10 (×2): qty 10, 30d supply, fill #0
  Filled 2022-01-07: qty 10, 30d supply, fill #1
  Filled 2022-05-03: qty 10, 30d supply, fill #2
  Filled 2022-08-23: qty 10, 30d supply, fill #3

## 2021-10-28 NOTE — Telephone Encounter (Signed)
Will forward to provider  

## 2021-10-28 NOTE — Telephone Encounter (Signed)
Copied from [place] [id number]. Topic: General - Other ?>> Oct 28, 2021  2:39 PM Yvette Rack wrote: ?Reason for CRM: Pt request Rx for sildenafil (VIAGRA) 50 MG tablet be increased to 100 MG. ?

## 2021-11-03 ENCOUNTER — Other Ambulatory Visit: Payer: Self-pay

## 2021-11-10 ENCOUNTER — Other Ambulatory Visit: Payer: Self-pay

## 2021-11-11 ENCOUNTER — Other Ambulatory Visit: Payer: Self-pay

## 2021-11-13 ENCOUNTER — Other Ambulatory Visit: Payer: Self-pay

## 2021-11-25 ENCOUNTER — Other Ambulatory Visit: Payer: Self-pay | Admitting: Internal Medicine

## 2021-11-25 ENCOUNTER — Other Ambulatory Visit: Payer: Self-pay

## 2021-11-25 DIAGNOSIS — I10 Essential (primary) hypertension: Secondary | ICD-10-CM

## 2021-11-25 MED ORDER — VALSARTAN 40 MG PO TABS
40.0000 mg | ORAL_TABLET | Freq: Every day | ORAL | 0 refills | Status: DC
  Filled 2021-11-25 – 2022-02-15 (×2): qty 90, 90d supply, fill #0

## 2021-12-07 DIAGNOSIS — M5440 Lumbago with sciatica, unspecified side: Secondary | ICD-10-CM | POA: Diagnosis not present

## 2021-12-07 DIAGNOSIS — M76892 Other specified enthesopathies of left lower limb, excluding foot: Secondary | ICD-10-CM | POA: Diagnosis not present

## 2021-12-07 DIAGNOSIS — M21061 Valgus deformity, not elsewhere classified, right knee: Secondary | ICD-10-CM | POA: Diagnosis not present

## 2021-12-07 DIAGNOSIS — M2170 Unequal limb length (acquired), unspecified site: Secondary | ICD-10-CM | POA: Diagnosis not present

## 2021-12-07 DIAGNOSIS — G8929 Other chronic pain: Secondary | ICD-10-CM | POA: Diagnosis not present

## 2021-12-07 DIAGNOSIS — M25561 Pain in right knee: Secondary | ICD-10-CM | POA: Diagnosis not present

## 2021-12-07 DIAGNOSIS — Z720 Tobacco use: Secondary | ICD-10-CM | POA: Diagnosis not present

## 2021-12-07 DIAGNOSIS — M2041 Other hammer toe(s) (acquired), right foot: Secondary | ICD-10-CM | POA: Diagnosis not present

## 2021-12-07 DIAGNOSIS — M21062 Valgus deformity, not elsewhere classified, left knee: Secondary | ICD-10-CM | POA: Diagnosis not present

## 2021-12-07 DIAGNOSIS — M25562 Pain in left knee: Secondary | ICD-10-CM | POA: Diagnosis not present

## 2021-12-07 DIAGNOSIS — M76891 Other specified enthesopathies of right lower limb, excluding foot: Secondary | ICD-10-CM | POA: Diagnosis not present

## 2021-12-07 DIAGNOSIS — M1612 Unilateral primary osteoarthritis, left hip: Secondary | ICD-10-CM | POA: Diagnosis not present

## 2021-12-09 DIAGNOSIS — L818 Other specified disorders of pigmentation: Secondary | ICD-10-CM | POA: Diagnosis not present

## 2021-12-16 DIAGNOSIS — G8929 Other chronic pain: Secondary | ICD-10-CM | POA: Diagnosis not present

## 2021-12-16 DIAGNOSIS — M2042 Other hammer toe(s) (acquired), left foot: Secondary | ICD-10-CM | POA: Diagnosis not present

## 2021-12-16 DIAGNOSIS — M2011 Hallux valgus (acquired), right foot: Secondary | ICD-10-CM | POA: Diagnosis not present

## 2021-12-16 DIAGNOSIS — M2012 Hallux valgus (acquired), left foot: Secondary | ICD-10-CM | POA: Diagnosis not present

## 2021-12-16 DIAGNOSIS — M79674 Pain in right toe(s): Secondary | ICD-10-CM | POA: Diagnosis not present

## 2021-12-16 DIAGNOSIS — M898X9 Other specified disorders of bone, unspecified site: Secondary | ICD-10-CM | POA: Diagnosis not present

## 2021-12-16 DIAGNOSIS — L600 Ingrowing nail: Secondary | ICD-10-CM | POA: Diagnosis not present

## 2021-12-16 DIAGNOSIS — M2041 Other hammer toe(s) (acquired), right foot: Secondary | ICD-10-CM | POA: Diagnosis not present

## 2021-12-31 ENCOUNTER — Other Ambulatory Visit: Payer: Self-pay

## 2021-12-31 ENCOUNTER — Other Ambulatory Visit: Payer: Self-pay | Admitting: Internal Medicine

## 2021-12-31 DIAGNOSIS — R252 Cramp and spasm: Secondary | ICD-10-CM

## 2021-12-31 MED ORDER — CYCLOBENZAPRINE HCL 5 MG PO TABS
5.0000 mg | ORAL_TABLET | Freq: Two times a day (BID) | ORAL | 0 refills | Status: DC | PRN
  Filled 2021-12-31 – 2022-01-07 (×2): qty 30, 15d supply, fill #0

## 2022-01-06 ENCOUNTER — Other Ambulatory Visit: Payer: Self-pay

## 2022-01-07 ENCOUNTER — Other Ambulatory Visit: Payer: Self-pay

## 2022-01-11 DIAGNOSIS — L818 Other specified disorders of pigmentation: Secondary | ICD-10-CM | POA: Diagnosis not present

## 2022-02-05 NOTE — Therapy (Signed)
OUTPATIENT PHYSICAL THERAPY LOWER EXTREMITY EVALUATION   Patient Name: Calvin [name] MRN: [id number] DOB:09-24-1960, 61 y.o., male Today's Date: 02/09/2022   PT End of Session - 02/08/22 1458     Visit Number 1    Number of Visits 7    Date for PT Re-Evaluation 03/27/22    Authorization Type MCR/MCD    PT Start Time 1500    PT Stop Time 1543    PT Time Calculation (min) 43 min    Activity Tolerance Patient tolerated treatment well    Behavior During Therapy [facility] for tasks assessed/performed             Past Medical History:  Diagnosis Date   Anxiety    Arthritis    back, shoulders    Constipation    Depression    Disc disorder    Hypertension    Lumbar herniated disc    Muscle spasm    Past Surgical History:  Procedure Laterality Date   ANTERIOR CERVICAL DECOMP/DISCECTOMY FUSION N/A 03/08/2018   Procedure: ANTERIOR CERVICAL DECOMPRESSION/DISCECTOMY FUSION , cervical 3-4, cervical 4-5, cervical 5-6 with intrumentational and allograft;  Surgeon: Phylliss Bob, MD;  Location: [facility];  Service: Orthopedics;  Laterality: N/A;   BONE BIOPSY Right 05/17/2018   Procedure: BONE BIOPSY X2;  Surgeon: Evelina Bucy, DPM;  Location: [facility];  Service: Podiatry;  Laterality: Right;  right second toe   COLONOSCOPY     FOOT SURGERY     right, ~ 2002   POLYPECTOMY     Patient Active Problem List   Diagnosis Date Noted   Hepatitis C virus infection cured after antiviral drug therapy 09/24/2021   Chronic hepatitis C ([place]) 09/17/2021   Hyperlipidemia 08/05/2021   CKD (chronic kidney disease) stage 3, GFR 30-59 ml/min (HCC) 08/05/2021   Insomnia 08/03/2021   Tobacco dependence 08/03/2021   Opioid use disorder in remission 08/03/2021   Severe sepsis ([place]) 08/07/2019   Lobar pneumonia ([place]) 08/07/2019   Diarrhea 08/07/2019   Anemia 08/07/2019   Foot laceration, right, initial encounter    Radiculopathy 03/08/2018   Tubular adenoma 05/16/2017   Depressed mood  05/16/2017   Substance abuse ([place]) 05/16/2017   Hallux valgus 08/03/2016   Disc disorder    Lumbar facet arthropathy 08/06/2014   Lumbar radiculopathy 08/06/2014   ED (erectile dysfunction) 01/07/2014   Unspecified vitamin D deficiency 01/07/2014   Left shoulder pain 08/10/2013   Chronic low back pain 08/10/2013   Essential hypertension 08/10/2013   Tobacco abuse 08/10/2013    PCP: Ladell Pier, MD  REFERRING PROVIDER: Maudie Flakes, MD   REFERRING DIAG: M16.12 (ICD-10-CM) - Unilateral primary osteoarthritis, left hip   THERAPY DIAG:  No diagnosis found.  Rationale for Evaluation and Treatment Rehabilitation  ONSET DATE: chronic   SUBJECTIVE:   SUBJECTIVE STATEMENT: Patient reports history of chronic Lt hip pain and balance difficulties since a fall he sustained while recovering from a neck surgery in 2019. He reports he broke his hip during this fall, but did not undergo any operative treatment. He feels that his left leg is shorter and uses a cane for ambulation. He was recently scheduled for a THA, but this did not a occur due to multiple factors, though has future plans to undergo THA. He reports he is having pain in both hips with the right one hurting more than the left one currently.   PERTINENT HISTORY: ACDF 2019   PAIN:  Are you having pain? Yes:  NPRS scale: 6/10 Pain location: bilateral hips (posterolateral) Pain description: dull ache Aggravating factors: laying down Relieving factors: BC powder, exercise  PRECAUTIONS: Fall  WEIGHT BEARING RESTRICTIONS No  FALLS:  Has patient fallen in last 6 months? No  LIVING ENVIRONMENT: Lives with: lives with their family Lives in: House/apartment Stairs: Yes: Internal: 14 steps; on right going up Has following equipment at home: Single point cane  OCCUPATION: unemployed   PLOF: Independent  PATIENT GOALS "I want to go back to work."   OBJECTIVE:   DIAGNOSTIC FINDINGS: X-rays: LEFT HIP: No  acute fracture or dislocation. Mild left greater than right hip joint space narrowing. Marginal osteophytosis of the femoral head and superolateral acetabulum. Enthesopathic changes about the greater trochanter. The soft tissues are unremarkable.  LEFT KNEE: No acute fracture or dislocation. The joint spaces are preserved. The soft tissues are unremarkable.  RIGHT KNEE: No acute fracture or dislocation. The joint spaces are preserved. The soft tissues are unremarkable.  BONE LENGTH: Slight left cephalad pelvic tilt. No acute fracture. Asymmetric effective leg length, measuring 88.4 cm on left and 87.4 cm on the right. Left greater than right genu valgum.  Visualized joint spaces are maintained.  PATIENT SURVEYS:  FOTO 54% function to 66% predicted  COGNITION:  Overall cognitive status: Within functional limits for tasks assessed     SENSATION: Not tested  EDEMA:  N/A  MUSCLE LENGTH: Hamstrings: mild tightness bilaterally  Thomas test: positive  POSTURE: Rt knee flexed, trunk forward and rotated  PALPATION: Bilateral gluteals   LOWER EXTREMITY ROM:  Active ROM Right eval Left eval  Hip flexion 85 88  Hip extension  Lacking 2 degrees   Hip abduction  15  Hip adduction    Hip internal rotation    Hip external rotation    Knee flexion    Knee extension    Ankle dorsiflexion    Ankle plantarflexion    Ankle inversion    Ankle eversion     (Blank rows = not tested)  LOWER EXTREMITY MMT:  MMT Right eval Left eval  Hip flexion 4+ 4  Hip extension 4 4-  Hip abduction 4- 3+  Hip adduction 5 5  Hip internal rotation    Hip external rotation    Knee flexion    Knee extension    Ankle dorsiflexion    Ankle plantarflexion    Ankle inversion    Ankle eversion     (Blank rows = not tested)  LOWER EXTREMITY SPECIAL TESTS:  Hip special tests: Saralyn Pilar (FABER) test: positive  Ely's positive bilaterally   Leg length: umbilicus to medial malleoli 101 cm  LLE 104 cm RLE  FUNCTIONAL TESTS:  5 times sit to stand: 11.2 seconds Timed up and go (TUG): 15 seconds  SLS: 3 seconds each   GAIT: Distance walked: 10 ft  Assistive device utilized: Single point cane Level of assistance: Modified independence Comments: maintains Lt knee in extension, limited hip extension on the LLE, Lt hip adducted, forward flexed posture; right lateral trunk lean     TODAY'S TREATMENT: [facility] Adult PT Treatment:                                                DATE: 02/05/22 Therapeutic Exercise: Demonstrated and issue initial HEP.   Therapeutic Activity: Education on assessment findings that will be addressed throughout  duration of POC.     PATIENT EDUCATION:  Education details: see treatment  Person educated: Patient Education method: Explanation, Demonstration, Tactile cues, Verbal cues, and Handouts Education comprehension: verbalized understanding, returned demonstration, verbal cues required, tactile cues required, and needs further education   HOME EXERCISE PROGRAM: Access Code: [email or web address] URL: [email or web address] Date: 02/08/2022 Prepared by: Gwendolyn Grant  Exercises - Seated Long Arc Quad  - 1 x daily - 7 x weekly - 3 sets - 10 reps - 3 hold - Supine Bridge  - 1 x daily - 7 x weekly - 3 sets - 10 reps - 3 hold - Supine Straight Leg Raises  - 1 x daily - 7 x weekly - 3 sets - 10 reps - 3 hold - Hooklying Clamshell with Resistance  - 1 x daily - 7 x weekly - 3 sets - 10 reps - 3 hold - Supine Hip Adduction Isometric with Ball  - 1 x daily - 7 x weekly - 3 sets - 10 reps - 3 hold - Gastroc Stretch on Wall  - 1 x daily - 7 x weekly - 1 sets - 3 reps - 20 hold - Seated Hamstring Stretch  - 1 x daily - 7 x weekly - 1 sets - 3 reps - 20 hold - Hooklying Single Knee to Chest  - 1 x daily - 7 x weekly - 1 sets - 3 reps - 20 hold - Supine Piriformis Stretch with Foot on Ground  - 1 x daily - 7 x weekly - 1 sets - 3 reps - 20 hold - Supine  Figure 4 Piriformis Stretch  - 1 x daily - 7 x weekly - 1 sets - 3 reps - 20 hold - Heel Toe Raises with Counter Support  - 1 x daily - 7 x weekly - 3 sets - 10 reps - 2 hold  ASSESSMENT:  CLINICAL IMPRESSION: Patient is a 61 y.o. male, well known to our clinic, who was seen today for physical therapy evaluation and treatment for chronic left hip pain. Upon assessment he is noted to have postural abnormalities, limited Lt hip ROM, hip weakness ,and a significant leg length discrepancy that are leading to gait and balance impairments.  He will benefit from skilled PT to address the above stated deficits in order to optimize his function.    OBJECTIVE IMPAIRMENTS Abnormal gait, decreased activity tolerance, decreased balance, decreased mobility, difficulty walking, decreased ROM, decreased strength, hypomobility, impaired flexibility, improper body mechanics, postural dysfunction, and pain.   ACTIVITY LIMITATIONS lifting, bending, standing, squatting, stairs, and locomotion level  PARTICIPATION LIMITATIONS: shopping, community activity, and yard work  PERSONAL FACTORS Age, Past/current experiences, Time since onset of injury/illness/exacerbation, and 1 comorbidity: lumbar herniated disc  are also affecting patient's functional outcome.   REHAB POTENTIAL: Fair previous PT for same condition  CLINICAL DECISION MAKING: Stable/uncomplicated  EVALUATION COMPLEXITY: Low   GOALS: Goals reviewed with patient? No  SHORT TERM GOALS: Target date: 03/02/2022  Patient will be independent and compliant with initial HEP.   Baseline: Goal status: INITIAL  2.  Patient will demonstrate knee flexion on the LLE during his gait cycle.  Baseline: maintains knee extension Goal status: INITIAL   LONG TERM GOALS: Target date: 03/23/2022   Patient will maintain SLS for at least 5 seconds to improve his stability with walking on uneven surfaces.  Baseline:  Goal status: INITIAL  2.  Patient will  demonstrate 4/5 Lt hip abductor and extensor strength to improve stability  about the chain with walking.  Baseline: see above  Goal status: INITIAL  3. Patient will be independent with advanced home program to progress/maintain current level of function.  Baseline: initial HEP issued  Goal status: INITIAL  4.  Patient will complete TUG in </=12 seconds to signify a reduction in fall risk.  Baseline: see above Goal status: INITIAL   PLAN: PT FREQUENCY: 1x/week  PT DURATION: 6 weeks  PLANNED INTERVENTIONS: Therapeutic exercises, Therapeutic activity, Neuromuscular re-education, Balance training, Gait training, Patient/Family education, Self Care, Joint mobilization, Stair training, Dry Needling, Cryotherapy, Moist heat, Manual therapy, and Re-evaluation  PLAN FOR NEXT SESSION: hip mobility, hip strengthening, balance, review and update HEP PRN  Gwendolyn Grant, PT, DPT, ATC 02/09/22 8:23 AM

## 2022-02-08 ENCOUNTER — Ambulatory Visit: Payer: Medicare Other | Attending: Orthopedic Surgery

## 2022-02-08 DIAGNOSIS — M25551 Pain in right hip: Secondary | ICD-10-CM

## 2022-02-08 DIAGNOSIS — R262 Difficulty in walking, not elsewhere classified: Secondary | ICD-10-CM | POA: Diagnosis not present

## 2022-02-08 DIAGNOSIS — M25652 Stiffness of left hip, not elsewhere classified: Secondary | ICD-10-CM | POA: Diagnosis not present

## 2022-02-08 DIAGNOSIS — M25552 Pain in left hip: Secondary | ICD-10-CM | POA: Diagnosis not present

## 2022-02-08 DIAGNOSIS — M6281 Muscle weakness (generalized): Secondary | ICD-10-CM | POA: Diagnosis not present

## 2022-02-09 DIAGNOSIS — L308 Other specified dermatitis: Secondary | ICD-10-CM | POA: Diagnosis not present

## 2022-02-15 ENCOUNTER — Other Ambulatory Visit: Payer: Self-pay | Admitting: Internal Medicine

## 2022-02-15 ENCOUNTER — Other Ambulatory Visit: Payer: Self-pay

## 2022-02-15 MED ORDER — ATORVASTATIN CALCIUM 10 MG PO TABS
10.0000 mg | ORAL_TABLET | Freq: Every day | ORAL | 0 refills | Status: DC
Start: 2022-02-15 — End: 2022-11-08
  Filled 2022-02-15: qty 30, 30d supply, fill #0

## 2022-02-16 ENCOUNTER — Other Ambulatory Visit: Payer: Self-pay

## 2022-02-17 ENCOUNTER — Ambulatory Visit: Payer: Medicare Other

## 2022-02-17 DIAGNOSIS — M25652 Stiffness of left hip, not elsewhere classified: Secondary | ICD-10-CM | POA: Diagnosis not present

## 2022-02-17 DIAGNOSIS — M25552 Pain in left hip: Secondary | ICD-10-CM

## 2022-02-17 DIAGNOSIS — R262 Difficulty in walking, not elsewhere classified: Secondary | ICD-10-CM | POA: Diagnosis not present

## 2022-02-17 DIAGNOSIS — M6281 Muscle weakness (generalized): Secondary | ICD-10-CM | POA: Diagnosis not present

## 2022-02-17 DIAGNOSIS — M25551 Pain in right hip: Secondary | ICD-10-CM | POA: Diagnosis not present

## 2022-02-17 NOTE — Therapy (Signed)
OUTPATIENT PHYSICAL THERAPY TREATMENT NOTE   Patient Name: Calvin [name] MRN: [id number] DOB:1960/09/12, 61 y.o., male Today's Date: 02/17/2022  PCP: Ladell Pier, MD REFERRING PROVIDER: Maudie Flakes, MD   END OF SESSION:   PT End of Session - 02/17/22 1503     Visit Number 2    Number of Visits 7    Date for PT Re-Evaluation 03/27/22    Authorization Type MCR/MCD    PT Start Time 1502    PT Stop Time 1545    PT Time Calculation (min) 43 min    Equipment Utilized During Treatment Other (comment)   [place]   Activity Tolerance Patient tolerated treatment well    Behavior During Therapy WFL for tasks assessed/performed             Past Medical History:  Diagnosis Date   Anxiety    Arthritis    back, shoulders    Constipation    Depression    Disc disorder    Hypertension    Lumbar herniated disc    Muscle spasm    Past Surgical History:  Procedure Laterality Date   ANTERIOR CERVICAL DECOMP/DISCECTOMY FUSION N/A 03/08/2018   Procedure: ANTERIOR CERVICAL DECOMPRESSION/DISCECTOMY FUSION , cervical 3-4, cervical 4-5, cervical 5-6 with intrumentational and allograft;  Surgeon: Phylliss Bob, MD;  Location: [place];  Service: Orthopedics;  Laterality: N/A;   BONE BIOPSY Right 05/17/2018   Procedure: BONE BIOPSY X2;  Surgeon: Evelina Bucy, DPM;  Location: [place];  Service: Podiatry;  Laterality: Right;  right second toe   COLONOSCOPY     FOOT SURGERY     right, ~ 2002   POLYPECTOMY     Patient Active Problem List   Diagnosis Date Noted   Hepatitis C virus infection cured after antiviral drug therapy 09/24/2021   Chronic hepatitis C ([place]) 09/17/2021   Hyperlipidemia 08/05/2021   CKD (chronic kidney disease) stage 3, GFR 30-59 ml/min (HCC) 08/05/2021   Insomnia 08/03/2021   Tobacco dependence 08/03/2021   Opioid use disorder in remission 08/03/2021   Severe sepsis ([place]) 08/07/2019   Lobar pneumonia ([place]) 08/07/2019   Diarrhea 08/07/2019    Anemia 08/07/2019   Foot laceration, right, initial encounter    Radiculopathy 03/08/2018   Tubular adenoma 05/16/2017   Depressed mood 05/16/2017   Substance abuse ([place]) 05/16/2017   Hallux valgus 08/03/2016   Disc disorder    Lumbar facet arthropathy 08/06/2014   Lumbar radiculopathy 08/06/2014   ED (erectile dysfunction) 01/07/2014   Unspecified vitamin D deficiency 01/07/2014   Left shoulder pain 08/10/2013   Chronic low back pain 08/10/2013   Essential hypertension 08/10/2013   Tobacco abuse 08/10/2013    REFERRING DIAG: M16.12 (ICD-10-CM) - Unilateral primary osteoarthritis, left hip   THERAPY DIAG:  Pain in left hip  Difficulty in walking, not elsewhere classified  Muscle weakness (generalized)  Stiffness of left hip, not elsewhere classified  Rationale for Evaluation and Treatment Rehabilitation  SUBJECTIVE:    SUBJECTIVE STATEMENT: Pt reports his bilat hip pain is about the same. Pt reports he does some of his HEP everyday.   PAIN:  Are you having pain? Yes: NPRS scale: 6/10 Pain location: bilateral hips (posterolateral) Pain description: dull ache Aggravating factors: laying down Relieving factors: BC powder, exercise  PERTINENT HISTORY: ACDF 2019    PRECAUTIONS: Fall   WEIGHT BEARING RESTRICTIONS No   FALLS:  Has patient fallen in last 6 months? No   LIVING ENVIRONMENT: Lives with: lives  with their family Lives in: House/apartment Stairs: Yes: Internal: 14 steps; on right going up Has following equipment at home: Single point cane   OCCUPATION: unemployed    PLOF: Independent   PATIENT GOALS "I want to go back to work."      OBJECTIVE: (objective measures completed at initial evaluation unless otherwise dated)   DIAGNOSTIC FINDINGS: X-rays: LEFT HIP: No acute fracture or dislocation. Mild left greater than right hip joint space narrowing. Marginal osteophytosis of the femoral head and superolateral acetabulum. Enthesopathic  changes about the greater trochanter. The soft tissues are unremarkable.  LEFT KNEE: No acute fracture or dislocation. The joint spaces are preserved. The soft tissues are unremarkable.  RIGHT KNEE: No acute fracture or dislocation. The joint spaces are preserved. The soft tissues are unremarkable.  BONE LENGTH: Slight left cephalad pelvic tilt. No acute fracture. Asymmetric effective leg length, measuring 88.4 cm on left and 87.4 cm on the right. Left greater than right genu valgum.  Visualized joint spaces are maintained.   PATIENT SURVEYS:  FOTO 54% function to 66% predicted   COGNITION:           Overall cognitive status: Within functional limits for tasks assessed                          SENSATION: Not tested   EDEMA:  N/A   MUSCLE LENGTH: Hamstrings: mild tightness bilaterally  Thomas test: positive   POSTURE: Rt knee flexed, trunk forward and rotated   PALPATION: Bilateral gluteals    LOWER EXTREMITY ROM:   Active ROM Right eval Left eval  Hip flexion 85 88  Hip extension   Lacking 2 degrees   Hip abduction   15  Hip adduction      Hip internal rotation      Hip external rotation      Knee flexion      Knee extension      Ankle dorsiflexion      Ankle plantarflexion      Ankle inversion      Ankle eversion       (Blank rows = not tested)   LOWER EXTREMITY MMT:   MMT Right eval Left eval  Hip flexion 4+ 4  Hip extension 4 4-  Hip abduction 4- 3+  Hip adduction 5 5  Hip internal rotation      Hip external rotation      Knee flexion      Knee extension      Ankle dorsiflexion      Ankle plantarflexion      Ankle inversion      Ankle eversion       (Blank rows = not tested)   LOWER EXTREMITY SPECIAL TESTS:  Hip special tests: Saralyn Pilar (FABER) test: positive  Ely's positive bilaterally    Leg length: umbilicus to medial malleoli 101 cm LLE 104 cm RLE   FUNCTIONAL TESTS:  5 times sit to stand: 11.2 seconds Timed up and go (TUG):  15 seconds  SLS: 3 seconds each    GAIT: Distance walked: 10 ft  Assistive device utilized: Single point cane Level of assistance: Modified independence Comments: maintains Lt knee in extension, limited hip extension on the LLE, Lt hip adducted, forward flexed posture; right lateral trunk lean        TODAY'S TREATMENT: [facility] Adult PT Treatment:  DATE: 02/17/22 Therapeutic Exercise: Nustep 6 min L6 LAQ 2x10 5#  Hook lying abd 2x10 BluTB each SLR 2x10 5# each Supine Piriformis Stretch x2 20" Supine Figure 4 Piriformis Stretch x2 20"  STS 2x10 s hand assist  [facility] Adult PT Treatment:                                                DATE: 02/05/22 Therapeutic Exercise: Demonstrated and issue initial HEP.    Therapeutic Activity: Education on assessment findings that will be addressed throughout duration of POC.        PATIENT EDUCATION:  Education details: see treatment  Person educated: Patient Education method: Explanation, Demonstration, Tactile cues, Verbal cues, and Handouts Education comprehension: verbalized understanding, returned demonstration, verbal cues required, tactile cues required, and needs further education     HOME EXERCISE PROGRAM: Access Code: [email or web address] URL: [email or web address] Date: 02/08/2022 Prepared by: Gwendolyn Grant   Exercises - Seated Long Arc Quad  - 1 x daily - 7 x weekly - 3 sets - 10 reps - 3 hold - Supine Bridge  - 1 x daily - 7 x weekly - 3 sets - 10 reps - 3 hold - Supine Straight Leg Raises  - 1 x daily - 7 x weekly - 3 sets - 10 reps - 3 hold - Hooklying Clamshell with Resistance  - 1 x daily - 7 x weekly - 3 sets - 10 reps - 3 hold - Supine Hip Adduction Isometric with Ball  - 1 x daily - 7 x weekly - 3 sets - 10 reps - 3 hold - Gastroc Stretch on Wall  - 1 x daily - 7 x weekly - 1 sets - 3 reps - 20 hold - Seated Hamstring Stretch  - 1 x daily - 7 x weekly - 1 sets - 3 reps -  20 hold - Hooklying Single Knee to Chest  - 1 x daily - 7 x weekly - 1 sets - 3 reps - 20 hold - Supine Piriformis Stretch with Foot on Ground  - 1 x daily - 7 x weekly - 1 sets - 3 reps - 20 hold - Supine Figure 4 Piriformis Stretch  - 1 x daily - 7 x weekly - 1 sets - 3 reps - 20 hold - Heel Toe Raises with Counter Support  - 1 x daily - 7 x weekly - 3 sets - 10 reps - 2 hold   ASSESSMENT:   CLINICAL IMPRESSION: PT was completed for hip/LE strengthening and flexibility therex. Pt returned proper demonstration of therex and reports compliance with his HEP. Pt is using a SPC for balance and L LE support. Pt tolerated PT today without adverse effects. Pt will benefit from skilled PT to address impairments in preparation for upcoming THR.     OBJECTIVE IMPAIRMENTS Abnormal gait, decreased activity tolerance, decreased balance, decreased mobility, difficulty walking, decreased ROM, decreased strength, hypomobility, impaired flexibility, improper body mechanics, postural dysfunction, and pain.    ACTIVITY LIMITATIONS lifting, bending, standing, squatting, stairs, and locomotion level   PARTICIPATION LIMITATIONS: shopping, community activity, and yard work   PERSONAL FACTORS Age, Past/current experiences, Time since onset of injury/illness/exacerbation, and 1 comorbidity: lumbar herniated disc  are also affecting patient's functional outcome.    REHAB POTENTIAL: Fair previous PT for same condition   CLINICAL DECISION MAKING: Stable/uncomplicated  EVALUATION COMPLEXITY: Low     GOALS: Goals reviewed with patient? No   SHORT TERM GOALS: Target date: 03/02/2022  Patient will be independent and compliant with initial HEP.    Baseline: Goal status: INITIAL   2.  Patient will demonstrate knee flexion on the LLE during his gait cycle.  Baseline: maintains knee extension Goal status: INITIAL     LONG TERM GOALS: Target date: 03/23/2022    Patient will maintain SLS for at least 5 seconds  to improve his stability with walking on uneven surfaces.  Baseline:  Goal status: INITIAL   2.  Patient will demonstrate 4/5 Lt hip abductor and extensor strength to improve stability about the chain with walking.  Baseline: see above  Goal status: INITIAL   3. Patient will be independent with advanced home program to progress/maintain current level of function.  Baseline: initial HEP issued  Goal status: INITIAL   4.  Patient will complete TUG in </=12 seconds to signify a reduction in fall risk.  Baseline: see above Goal status: INITIAL     PLAN: PT FREQUENCY: 1x/week   PT DURATION: 6 weeks   PLANNED INTERVENTIONS: Therapeutic exercises, Therapeutic activity, Neuromuscular re-education, Balance training, Gait training, Patient/Family education, Self Care, Joint mobilization, Stair training, Dry Needling, Cryotherapy, Moist heat, Manual therapy, and Re-evaluation   PLAN FOR NEXT SESSION: hip mobility, hip strengthening, balance, review and update HEP PRN   Gar Ponto MS, PT 02/17/22 6:21 PM

## 2022-02-23 DIAGNOSIS — L258 Unspecified contact dermatitis due to other agents: Secondary | ICD-10-CM | POA: Diagnosis not present

## 2022-02-24 ENCOUNTER — Ambulatory Visit: Payer: Medicare Other

## 2022-02-25 NOTE — Therapy (Signed)
OUTPATIENT PHYSICAL THERAPY TREATMENT NOTE   Patient Name: Calvin [name] MRN: [id number] DOB:13-Oct-1960, 61 y.o., male Today's Date: 02/27/2022  PCP: Ladell Pier, MD REFERRING PROVIDER: Maudie Flakes, MD   END OF SESSION:   PT End of Session - 02/26/22 1246     Visit Number 3    Number of Visits 7    Date for PT Re-Evaluation 03/27/22    Authorization Type MCR/MCD    PT Start Time 1239    PT Stop Time 1319    PT Time Calculation (min) 40 min    Equipment Utilized During Treatment Other (comment)   [place]   Activity Tolerance Patient tolerated treatment well    Behavior During Therapy WFL for tasks assessed/performed              Past Medical History:  Diagnosis Date   Anxiety    Arthritis    back, shoulders    Constipation    Depression    Disc disorder    Hypertension    Lumbar herniated disc    Muscle spasm    Past Surgical History:  Procedure Laterality Date   ANTERIOR CERVICAL DECOMP/DISCECTOMY FUSION N/A 03/08/2018   Procedure: ANTERIOR CERVICAL DECOMPRESSION/DISCECTOMY FUSION , cervical 3-4, cervical 4-5, cervical 5-6 with intrumentational and allograft;  Surgeon: Phylliss Bob, MD;  Location: [place];  Service: Orthopedics;  Laterality: N/A;   BONE BIOPSY Right 05/17/2018   Procedure: BONE BIOPSY X2;  Surgeon: Evelina Bucy, DPM;  Location: [place];  Service: Podiatry;  Laterality: Right;  right second toe   COLONOSCOPY     FOOT SURGERY     right, ~ 2002   POLYPECTOMY     Patient Active Problem List   Diagnosis Date Noted   Hepatitis C virus infection cured after antiviral drug therapy 09/24/2021   Chronic hepatitis C ([place]) 09/17/2021   Hyperlipidemia 08/05/2021   CKD (chronic kidney disease) stage 3, GFR 30-59 ml/min (HCC) 08/05/2021   Insomnia 08/03/2021   Tobacco dependence 08/03/2021   Opioid use disorder in remission 08/03/2021   Severe sepsis ([place]) 08/07/2019   Lobar pneumonia ([place]) 08/07/2019   Diarrhea 08/07/2019    Anemia 08/07/2019   Foot laceration, right, initial encounter    Radiculopathy 03/08/2018   Tubular adenoma 05/16/2017   Depressed mood 05/16/2017   Substance abuse ([place]) 05/16/2017   Hallux valgus 08/03/2016   Disc disorder    Lumbar facet arthropathy 08/06/2014   Lumbar radiculopathy 08/06/2014   ED (erectile dysfunction) 01/07/2014   Unspecified vitamin D deficiency 01/07/2014   Left shoulder pain 08/10/2013   Chronic low back pain 08/10/2013   Essential hypertension 08/10/2013   Tobacco abuse 08/10/2013    REFERRING DIAG: M16.12 (ICD-10-CM) - Unilateral primary osteoarthritis, left hip   THERAPY DIAG:  Pain in left hip  Difficulty in walking, not elsewhere classified  Muscle weakness (generalized)  Stiffness of left hip, not elsewhere classified  Pain in right hip  Rationale for Evaluation and Treatment Rehabilitation  SUBJECTIVE:    SUBJECTIVE STATEMENT: Pt reports his bilat hip pain is the same at 6/10 today.    PAIN:  Are you having pain? Yes: NPRS scale: 6/10 Pain location: bilateral hips (posterolateral) Pain description: dull ache Aggravating factors: laying down Relieving factors: BC powder, exercise  PERTINENT HISTORY: ACDF 2019    PRECAUTIONS: Fall   WEIGHT BEARING RESTRICTIONS No   FALLS:  Has patient fallen in last 6 months? No   LIVING ENVIRONMENT: Lives with: lives  with their family Lives in: House/apartment Stairs: Yes: Internal: 14 steps; on right going up Has following equipment at home: Single point cane   OCCUPATION: unemployed    PLOF: Independent   PATIENT GOALS "I want to go back to work."      OBJECTIVE: (objective measures completed at initial evaluation unless otherwise dated)   DIAGNOSTIC FINDINGS: X-rays: LEFT HIP: No acute fracture or dislocation. Mild left greater than right hip joint space narrowing. Marginal osteophytosis of the femoral head and superolateral acetabulum. Enthesopathic changes about the  greater trochanter. The soft tissues are unremarkable.  LEFT KNEE: No acute fracture or dislocation. The joint spaces are preserved. The soft tissues are unremarkable.  RIGHT KNEE: No acute fracture or dislocation. The joint spaces are preserved. The soft tissues are unremarkable.  BONE LENGTH: Slight left cephalad pelvic tilt. No acute fracture. Asymmetric effective leg length, measuring 88.4 cm on left and 87.4 cm on the right. Left greater than right genu valgum.  Visualized joint spaces are maintained.   PATIENT SURVEYS:  FOTO 54% function to 66% predicted   COGNITION:           Overall cognitive status: Within functional limits for tasks assessed                          SENSATION: Not tested   EDEMA:  N/A   MUSCLE LENGTH: Hamstrings: mild tightness bilaterally  Thomas test: positive   POSTURE: Rt knee flexed, trunk forward and rotated   PALPATION: Bilateral gluteals    LOWER EXTREMITY ROM:   Active ROM Right eval Left eval  Hip flexion 85 88  Hip extension   Lacking 2 degrees   Hip abduction   15  Hip adduction      Hip internal rotation      Hip external rotation      Knee flexion      Knee extension      Ankle dorsiflexion      Ankle plantarflexion      Ankle inversion      Ankle eversion       (Blank rows = not tested)   LOWER EXTREMITY MMT:   MMT Right eval Left eval  Hip flexion 4+ 4  Hip extension 4 4-  Hip abduction 4- 3+  Hip adduction 5 5  Hip internal rotation      Hip external rotation      Knee flexion      Knee extension      Ankle dorsiflexion      Ankle plantarflexion      Ankle inversion      Ankle eversion       (Blank rows = not tested)   LOWER EXTREMITY SPECIAL TESTS:  Hip special tests: Saralyn Pilar (FABER) test: positive  Ely's positive bilaterally    Leg length: umbilicus to medial malleoli 101 cm LLE 104 cm RLE   FUNCTIONAL TESTS:  5 times sit to stand: 11.2 seconds Timed up and go (TUG): 15 seconds  SLS: 3  seconds each    GAIT: Distance walked: 10 ft  Assistive device utilized: Single point cane Level of assistance: Modified independence Comments: maintains Lt knee in extension, limited hip extension on the LLE, Lt hip adducted, forward flexed posture; right lateral trunk lean        TODAY'S TREATMENT: [facility] Adult PT Treatment:  DATE: 02/26/22 Therapeutic Exercise: Nustep 6 min L6 STS 2x10 s hand assist LAQ 2x15 6#  SL hip abd 2x15 6# SL hip clams 2x15 RTB SLR 2x15 6# each Supine Piriformis Stretch x2 20" Supine Figure 4 Piriformis Stretch x2 20"   Gait Training: Verbal cueing for L knee flex when advancing. With VC, pt demonstated appropriate knee flexion for pace and step length  [facility] Adult PT Treatment:                                                DATE: 02/17/22 Therapeutic Exercise: Nustep 6 min L6 LAQ 2x10 6#  SL abd 2x10 BluTB each SLR 2x10 5# each Supine Piriformis Stretch x2 20" Supine Figure 4 Piriformis Stretch x2 20"  STS 2x10 s hand assist  [facility] Adult PT Treatment:                                                DATE: 02/05/22 Therapeutic Exercise: Demonstrated and issue initial HEP.    Therapeutic Activity: Education on assessment findings that will be addressed throughout duration of POC.        PATIENT EDUCATION:  Education details: see treatment  Person educated: Patient Education method: Explanation, Demonstration, Tactile cues, Verbal cues, and Handouts Education comprehension: verbalized understanding, returned demonstration, verbal cues required, tactile cues required, and needs further education     HOME EXERCISE PROGRAM: Access Code: [email or web address] URL: [email or web address] Date: 02/08/2022 Prepared by: Gwendolyn Grant   Exercises - Seated Long Arc Quad  - 1 x daily - 7 x weekly - 3 sets - 10 reps - 3 hold - Supine Bridge  - 1 x daily - 7 x weekly - 3 sets - 10 reps - 3 hold - Supine  Straight Leg Raises  - 1 x daily - 7 x weekly - 3 sets - 10 reps - 3 hold - Hooklying Clamshell with Resistance  - 1 x daily - 7 x weekly - 3 sets - 10 reps - 3 hold - Supine Hip Adduction Isometric with Ball  - 1 x daily - 7 x weekly - 3 sets - 10 reps - 3 hold - Gastroc Stretch on Wall  - 1 x daily - 7 x weekly - 1 sets - 3 reps - 20 hold - Seated Hamstring Stretch  - 1 x daily - 7 x weekly - 1 sets - 3 reps - 20 hold - Hooklying Single Knee to Chest  - 1 x daily - 7 x weekly - 1 sets - 3 reps - 20 hold - Supine Piriformis Stretch with Foot on Ground  - 1 x daily - 7 x weekly - 1 sets - 3 reps - 20 hold - Supine Figure 4 Piriformis Stretch  - 1 x daily - 7 x weekly - 1 sets - 3 reps - 20 hold - Heel Toe Raises with Counter Support  - 1 x daily - 7 x weekly - 3 sets - 10 reps - 2 hold   ASSESSMENT:   CLINICAL IMPRESSION: PT was provided for gait training to improve gait pattern. With verbal cueing, pt was able to walk with improved knee flexion when advancing the the L LE. Flexion was appropriate for  pace and step length. Additionally, strengthening was for both legs. Pt tolerated gradual progress in physical demand without adverse effects. Pt will benefit from skilled PT to address impairments in preparation for upcoming THR.   OBJECTIVE IMPAIRMENTS Abnormal gait, decreased activity tolerance, decreased balance, decreased mobility, difficulty walking, decreased ROM, decreased strength, hypomobility, impaired flexibility, improper body mechanics, postural dysfunction, and pain.    ACTIVITY LIMITATIONS lifting, bending, standing, squatting, stairs, and locomotion level   PARTICIPATION LIMITATIONS: shopping, community activity, and yard work   PERSONAL FACTORS Age, Past/current experiences, Time since onset of injury/illness/exacerbation, and 1 comorbidity: lumbar herniated disc  are also affecting patient's functional outcome.    REHAB POTENTIAL: Fair previous PT for same condition    CLINICAL DECISION MAKING: Stable/uncomplicated   EVALUATION COMPLEXITY: Low     GOALS: Goals reviewed with patient? No   SHORT TERM GOALS: Target date: 03/02/2022  Patient will be independent and compliant with initial HEP.    Baseline: Goal status: INITIAL   2.  Patient will demonstrate knee flexion on the LLE during his gait cycle.  Baseline: maintains knee extension Goal status: INITIAL     LONG TERM GOALS: Target date: 03/23/2022    Patient will maintain SLS for at least 5 seconds to improve his stability with walking on uneven surfaces.  Baseline:  Goal status: INITIAL   2.  Patient will demonstrate 4/5 Lt hip abductor and extensor strength to improve stability about the chain with walking.  Baseline: see above  Goal status: INITIAL   3. Patient will be independent with advanced home program to progress/maintain current level of function.  Baseline: initial HEP issued  Goal status: INITIAL   4.  Patient will complete TUG in </=12 seconds to signify a reduction in fall risk.  Baseline: see above Goal status: INITIAL     PLAN: PT FREQUENCY: 1x/week   PT DURATION: 6 weeks   PLANNED INTERVENTIONS: Therapeutic exercises, Therapeutic activity, Neuromuscular re-education, Balance training, Gait training, Patient/Family education, Self Care, Joint mobilization, Stair training, Dry Needling, Cryotherapy, Moist heat, Manual therapy, and Re-evaluation   PLAN FOR NEXT SESSION: hip mobility, hip strengthening, balance, review and update HEP PRN   Kaleeya Hancock MS, PT 02/27/22 9:15 AM

## 2022-02-26 ENCOUNTER — Ambulatory Visit: Payer: Medicare Other | Attending: Orthopedic Surgery

## 2022-02-26 DIAGNOSIS — M25551 Pain in right hip: Secondary | ICD-10-CM | POA: Diagnosis not present

## 2022-02-26 DIAGNOSIS — G8929 Other chronic pain: Secondary | ICD-10-CM | POA: Diagnosis not present

## 2022-02-26 DIAGNOSIS — M25552 Pain in left hip: Secondary | ICD-10-CM | POA: Insufficient documentation

## 2022-02-26 DIAGNOSIS — M25652 Stiffness of left hip, not elsewhere classified: Secondary | ICD-10-CM | POA: Insufficient documentation

## 2022-02-26 DIAGNOSIS — M6281 Muscle weakness (generalized): Secondary | ICD-10-CM | POA: Insufficient documentation

## 2022-02-26 DIAGNOSIS — R262 Difficulty in walking, not elsewhere classified: Secondary | ICD-10-CM | POA: Diagnosis not present

## 2022-02-26 DIAGNOSIS — M545 Low back pain, unspecified: Secondary | ICD-10-CM | POA: Diagnosis not present

## 2022-03-02 NOTE — Therapy (Signed)
OUTPATIENT PHYSICAL THERAPY TREATMENT NOTE   Patient Name: Calvin [name] MRN: [id number] DOB:[birth year], 61 y.o., male Today's Date: 03/03/2022  PCP: Ladell Pier, MD REFERRING PROVIDER: Maudie Flakes, MD   END OF SESSION:   PT End of Session - 03/03/22 1515     Visit Number 4    Number of Visits 7    Date for PT Re-Evaluation 03/27/22    Authorization Type MCR/MCD    PT Start Time 1503    PT Stop Time 1545    PT Time Calculation (min) 42 min    Equipment Utilized During Treatment Other (comment)   [place]   Activity Tolerance Patient tolerated treatment well    Behavior During Therapy WFL for tasks assessed/performed               Past Medical History:  Diagnosis Date   Anxiety    Arthritis    back, shoulders    Constipation    Depression    Disc disorder    Hypertension    Lumbar herniated disc    Muscle spasm    Past Surgical History:  Procedure Laterality Date   ANTERIOR CERVICAL DECOMP/DISCECTOMY FUSION N/A 03/08/2018   Procedure: ANTERIOR CERVICAL DECOMPRESSION/DISCECTOMY FUSION , cervical 3-4, cervical 4-5, cervical 5-6 with intrumentational and allograft;  Surgeon: Phylliss Bob, MD;  Location: [facility];  Service: Orthopedics;  Laterality: N/A;   BONE BIOPSY Right 05/17/2018   Procedure: BONE BIOPSY X2;  Surgeon: Evelina Bucy, DPM;  Location: [facility];  Service: Podiatry;  Laterality: Right;  right second toe   COLONOSCOPY     FOOT SURGERY     right, ~ 2002   POLYPECTOMY     Patient Active Problem List   Diagnosis Date Noted   Hepatitis C virus infection cured after antiviral drug therapy 09/24/2021   Chronic hepatitis C ([place]) 09/17/2021   Hyperlipidemia 08/05/2021   CKD (chronic kidney disease) stage 3, GFR 30-59 ml/min (HCC) 08/05/2021   Insomnia 08/03/2021   Tobacco dependence 08/03/2021   Opioid use disorder in remission 08/03/2021   Severe sepsis ([place]) 08/07/2019   Lobar pneumonia ([place]) 08/07/2019   Diarrhea  08/07/2019   Anemia 08/07/2019   Foot laceration, right, initial encounter    Radiculopathy 03/08/2018   Tubular adenoma 05/16/2017   Depressed mood 05/16/2017   Substance abuse ([place]) 05/16/2017   Hallux valgus 08/03/2016   Disc disorder    Lumbar facet arthropathy 08/06/2014   Lumbar radiculopathy 08/06/2014   ED (erectile dysfunction) 01/07/2014   Unspecified vitamin D deficiency 01/07/2014   Left shoulder pain 08/10/2013   Chronic low back pain 08/10/2013   Essential hypertension 08/10/2013   Tobacco abuse 08/10/2013    REFERRING DIAG: M16.12 (ICD-10-CM) - Unilateral primary osteoarthritis, left hip   THERAPY DIAG:  Pain in left hip  Difficulty in walking, not elsewhere classified  Muscle weakness (generalized)  Stiffness of left hip, not elsewhere classified  Pain in right hip  Rationale for Evaluation and Treatment Rehabilitation  SUBJECTIVE:    SUBJECTIVE STATEMENT: Pt reports he is doing well. No change in his hip pain.   PAIN:  Are you having pain? Yes: NPRS scale: 6/10 Pain location: bilateral hips (posterolateral) Pain description: dull ache Aggravating factors: laying down Relieving factors: BC powder, exercise  PERTINENT HISTORY: ACDF 2019    PRECAUTIONS: Fall   WEIGHT BEARING RESTRICTIONS No   FALLS:  Has patient fallen in last 6 months? No   LIVING ENVIRONMENT: Lives with: lives  with their family Lives in: House/apartment Stairs: Yes: Internal: 14 steps; on right going up Has following equipment at home: Single point cane   OCCUPATION: unemployed    PLOF: Independent   PATIENT GOALS "I want to go back to work."      OBJECTIVE: (objective measures completed at initial evaluation unless otherwise dated)   DIAGNOSTIC FINDINGS: X-rays: LEFT HIP: No acute fracture or dislocation. Mild left greater than right hip joint space narrowing. Marginal osteophytosis of the femoral head and superolateral acetabulum. Enthesopathic changes  about the greater trochanter. The soft tissues are unremarkable.  LEFT KNEE: No acute fracture or dislocation. The joint spaces are preserved. The soft tissues are unremarkable.  RIGHT KNEE: No acute fracture or dislocation. The joint spaces are preserved. The soft tissues are unremarkable.  BONE LENGTH: Slight left cephalad pelvic tilt. No acute fracture. Asymmetric effective leg length, measuring 88.4 cm on left and 87.4 cm on the right. Left greater than right genu valgum.  Visualized joint spaces are maintained.   PATIENT SURVEYS:  FOTO 54% function to 66% predicted   COGNITION:           Overall cognitive status: Within functional limits for tasks assessed                          SENSATION: Not tested   EDEMA:  N/A   MUSCLE LENGTH: Hamstrings: mild tightness bilaterally  Thomas test: positive   POSTURE: Rt knee flexed, trunk forward and rotated   PALPATION: Bilateral gluteals    LOWER EXTREMITY ROM:   Active ROM Right eval Left eval  Hip flexion 85 88  Hip extension   Lacking 2 degrees   Hip abduction   15  Hip adduction      Hip internal rotation      Hip external rotation      Knee flexion      Knee extension      Ankle dorsiflexion      Ankle plantarflexion      Ankle inversion      Ankle eversion       (Blank rows = not tested)   LOWER EXTREMITY MMT:   MMT Right eval Left eval  Hip flexion 4+ 4  Hip extension 4 4-  Hip abduction 4- 3+  Hip adduction 5 5  Hip internal rotation      Hip external rotation      Knee flexion      Knee extension      Ankle dorsiflexion      Ankle plantarflexion      Ankle inversion      Ankle eversion       (Blank rows = not tested)   LOWER EXTREMITY SPECIAL TESTS:  Hip special tests: Saralyn Pilar (FABER) test: positive  Ely's positive bilaterally    Leg length: umbilicus to medial malleoli 101 cm LLE 104 cm RLE   FUNCTIONAL TESTS:  5 times sit to stand: 11.2 seconds Timed up and go (TUG): 15  seconds  SLS: 3 seconds each    GAIT: Distance walked: 10 ft  Assistive device utilized: Single point cane Level of assistance: Modified independence Comments: maintains Lt knee in extension, limited hip extension on the LLE, Lt hip adducted, forward flexed posture; right lateral trunk lean        TODAY'S TREATMENT: [facility] Adult PT Treatment:  DATE: 03/03/22 Therapeutic Exercise: Nustep 6 min L6 Omega knee ext 3x10 45# Omega knee flex 3x10 55# Leg press Cybex 3x10 100# Hip abd Cybex 3x10 22.5#  [facility] Adult PT Treatment:                                                DATE: 02/26/22 Therapeutic Exercise: Nustep 6 min L6 STS 2x10 s hand assist LAQ 2x15 6#  SL hip abd 2x15 6# SL hip clams 2x15 RTB SLR 2x15 6# each Supine Piriformis Stretch x2 20" Supine Figure 4 Piriformis Stretch x2 20"   Gait Training: Verbal cueing for L knee flex when advancing. With VC, pt demonstated appropriate knee flexion for pace and step length  [facility] Adult PT Treatment:                                                DATE: 02/17/22 Therapeutic Exercise: Nustep 6 min L6 LAQ 2x10 6#  SL abd 2x10 BluTB each SLR 2x10 5# each Supine Piriformis Stretch x2 20" Supine Figure 4 Piriformis Stretch x2 20"  STS 2x10 s hand assist   PATIENT EDUCATION:  Education details: see treatment  Person educated: Patient Education method: Explanation, Demonstration, Tactile cues, Verbal cues, and Handouts Education comprehension: verbalized understanding, returned demonstration, verbal cues required, tactile cues required, and needs further education     HOME EXERCISE PROGRAM: Access Code: [email or web address]   ASSESSMENT:   CLINICAL IMPRESSION: Pt completed PT for LE strengthening. Weight machines for isotonic strengthening were utilized today. Pt particapted with good effort and tolerated without adverse effects. Pt will continue to benefit from skilled PT to address impairments in  preparation for upcoming THR.   OBJECTIVE IMPAIRMENTS Abnormal gait, decreased activity tolerance, decreased balance, decreased mobility, difficulty walking, decreased ROM, decreased strength, hypomobility, impaired flexibility, improper body mechanics, postural dysfunction, and pain.    ACTIVITY LIMITATIONS lifting, bending, standing, squatting, stairs, and locomotion level   PARTICIPATION LIMITATIONS: shopping, community activity, and yard work   PERSONAL FACTORS Age, Past/current experiences, Time since onset of injury/illness/exacerbation, and 1 comorbidity: lumbar herniated disc  are also affecting patient's functional outcome.    REHAB POTENTIAL: Fair previous PT for same condition   CLINICAL DECISION MAKING: Stable/uncomplicated   EVALUATION COMPLEXITY: Low     GOALS: Goals reviewed with patient? No   SHORT TERM GOALS: Target date: 03/02/2022  Patient will be independent and compliant with initial HEP.    Baseline: Goal status: MET   2.  Patient will demonstrate knee flexion on the LLE during his gait cycle.  Baseline: maintains knee extension Goal status: MET     LONG TERM GOALS: Target date: 03/23/2022    Patient will maintain SLS for at least 5 seconds to improve his stability with walking on uneven surfaces.  Baseline:  Goal status: INITIAL   2.  Patient will demonstrate 4/5 Lt hip abductor and extensor strength to improve stability about the chain with walking.  Baseline: see above  Goal status: INITIAL   3. Patient will be independent with advanced home program to progress/maintain current level of function.  Baseline: initial HEP issued  Goal status: INITIAL   4.  Patient will complete TUG in </=12 seconds to signify a  reduction in fall risk.  Baseline: see above Goal status: INITIAL     PLAN: PT FREQUENCY: 1x/week   PT DURATION: 6 weeks   PLANNED INTERVENTIONS: Therapeutic exercises, Therapeutic activity, Neuromuscular re-education, Balance training,  Gait training, Patient/Family education, Self Care, Joint mobilization, Stair training, Dry Needling, Cryotherapy, Moist heat, Manual therapy, and Re-evaluation   PLAN FOR NEXT SESSION: hip mobility, hip strengthening, balance, review and update HEP PRN   Gar Ponto MS, PT 03/03/22 3:47 PM

## 2022-03-03 ENCOUNTER — Ambulatory Visit: Payer: Medicare Other

## 2022-03-03 DIAGNOSIS — M25652 Stiffness of left hip, not elsewhere classified: Secondary | ICD-10-CM

## 2022-03-03 DIAGNOSIS — M25552 Pain in left hip: Secondary | ICD-10-CM | POA: Diagnosis not present

## 2022-03-03 DIAGNOSIS — R262 Difficulty in walking, not elsewhere classified: Secondary | ICD-10-CM

## 2022-03-03 DIAGNOSIS — M6281 Muscle weakness (generalized): Secondary | ICD-10-CM | POA: Diagnosis not present

## 2022-03-03 DIAGNOSIS — M25551 Pain in right hip: Secondary | ICD-10-CM | POA: Diagnosis not present

## 2022-03-03 DIAGNOSIS — G8929 Other chronic pain: Secondary | ICD-10-CM | POA: Diagnosis not present

## 2022-03-03 DIAGNOSIS — M545 Low back pain, unspecified: Secondary | ICD-10-CM | POA: Diagnosis not present

## 2022-03-10 ENCOUNTER — Ambulatory Visit: Payer: Medicare Other

## 2022-03-10 DIAGNOSIS — R262 Difficulty in walking, not elsewhere classified: Secondary | ICD-10-CM

## 2022-03-10 DIAGNOSIS — G8929 Other chronic pain: Secondary | ICD-10-CM | POA: Diagnosis not present

## 2022-03-10 DIAGNOSIS — M25652 Stiffness of left hip, not elsewhere classified: Secondary | ICD-10-CM

## 2022-03-10 DIAGNOSIS — M25552 Pain in left hip: Secondary | ICD-10-CM

## 2022-03-10 DIAGNOSIS — M545 Low back pain, unspecified: Secondary | ICD-10-CM | POA: Diagnosis not present

## 2022-03-10 DIAGNOSIS — M6281 Muscle weakness (generalized): Secondary | ICD-10-CM | POA: Diagnosis not present

## 2022-03-10 DIAGNOSIS — M25551 Pain in right hip: Secondary | ICD-10-CM

## 2022-03-10 NOTE — Therapy (Signed)
OUTPATIENT PHYSICAL THERAPY TREATMENT NOTE   Patient Name: Calvin [name] MRN: [id number] DOB:January 06, 1961, 61 y.o., male Today's Date: 03/10/2022  PCP: Ladell Pier, MD REFERRING PROVIDER: Maudie Flakes, MD   END OF SESSION:   PT End of Session - 03/10/22 1517     Visit Number 5    Date for PT Re-Evaluation 03/27/22    Authorization Type MCR/MCD    PT Start Time 1508    PT Stop Time 1548    PT Time Calculation (min) 40 min    Equipment Utilized During Treatment Other (comment)   SPC   Activity Tolerance Patient tolerated treatment well    Behavior During Therapy WFL for tasks assessed/performed                Past Medical History:  Diagnosis Date   Anxiety    Arthritis    back, shoulders    Constipation    Depression    Disc disorder    Hypertension    Lumbar herniated disc    Muscle spasm    Past Surgical History:  Procedure Laterality Date   ANTERIOR CERVICAL DECOMP/DISCECTOMY FUSION N/A 03/08/2018   Procedure: ANTERIOR CERVICAL DECOMPRESSION/DISCECTOMY FUSION , cervical 3-4, cervical 4-5, cervical 5-6 with intrumentational and allograft;  Surgeon: Phylliss Bob, MD;  Location: [facility];  Service: Orthopedics;  Laterality: N/A;   BONE BIOPSY Right 05/17/2018   Procedure: BONE BIOPSY X2;  Surgeon: Evelina Bucy, DPM;  Location: [facility];  Service: Podiatry;  Laterality: Right;  right second toe   COLONOSCOPY     FOOT SURGERY     right, ~ 2002   POLYPECTOMY     Patient Active Problem List   Diagnosis Date Noted   Hepatitis C virus infection cured after antiviral drug therapy 09/24/2021   Chronic hepatitis C ([place]) 09/17/2021   Hyperlipidemia 08/05/2021   CKD (chronic kidney disease) stage 3, GFR 30-59 ml/min (HCC) 08/05/2021   Insomnia 08/03/2021   Tobacco dependence 08/03/2021   Opioid use disorder in remission 08/03/2021   Severe sepsis ([place]) 08/07/2019   Lobar pneumonia ([place]) 08/07/2019   Diarrhea 08/07/2019   Anemia  08/07/2019   Foot laceration, right, initial encounter    Radiculopathy 03/08/2018   Tubular adenoma 05/16/2017   Depressed mood 05/16/2017   Substance abuse ([place]) 05/16/2017   Hallux valgus 08/03/2016   Disc disorder    Lumbar facet arthropathy 08/06/2014   Lumbar radiculopathy 08/06/2014   ED (erectile dysfunction) 01/07/2014   Unspecified vitamin D deficiency 01/07/2014   Left shoulder pain 08/10/2013   Chronic low back pain 08/10/2013   Essential hypertension 08/10/2013   Tobacco abuse 08/10/2013    REFERRING DIAG: M16.12 (ICD-10-CM) - Unilateral primary osteoarthritis, left hip   THERAPY DIAG:  Pain in left hip  Difficulty in walking, not elsewhere classified  Muscle weakness (generalized)  Stiffness of left hip, not elsewhere classified  Pain in right hip  Rationale for Evaluation and Treatment Rehabilitation  SUBJECTIVE:    SUBJECTIVE STATEMENT: Pt offered no concerns   PAIN:  Are you having pain? Yes: NPRS scale: 6/10 Pain location: bilateral hips (posterolateral) Pain description: dull ache Aggravating factors: laying down Relieving factors: BC powder, exercise  PERTINENT HISTORY: ACDF 2019    PRECAUTIONS: Fall   WEIGHT BEARING RESTRICTIONS No   FALLS:  Has patient fallen in last 6 months? No   LIVING ENVIRONMENT: Lives with: lives with their family Lives in: House/apartment Stairs: Yes: Internal: 14 steps; on right going  up Has following equipment at home: Single point cane   OCCUPATION: unemployed    PLOF: Independent   PATIENT GOALS "I want to go back to work."      OBJECTIVE: (objective measures completed at initial evaluation unless otherwise dated)   DIAGNOSTIC FINDINGS: X-rays: LEFT HIP: No acute fracture or dislocation. Mild left greater than right hip joint space narrowing. Marginal osteophytosis of the femoral head and superolateral acetabulum. Enthesopathic changes about the greater trochanter. The soft tissues are  unremarkable.  LEFT KNEE: No acute fracture or dislocation. The joint spaces are preserved. The soft tissues are unremarkable.  RIGHT KNEE: No acute fracture or dislocation. The joint spaces are preserved. The soft tissues are unremarkable.  BONE LENGTH: Slight left cephalad pelvic tilt. No acute fracture. Asymmetric effective leg length, measuring 88.4 cm on left and 87.4 cm on the right. Left greater than right genu valgum.  Visualized joint spaces are maintained.   PATIENT SURVEYS:  FOTO 54% function to 66% predicted   COGNITION:           Overall cognitive status: Within functional limits for tasks assessed                          SENSATION: Not tested   EDEMA:  N/A   MUSCLE LENGTH: Hamstrings: mild tightness bilaterally  Thomas test: positive   POSTURE: Rt knee flexed, trunk forward and rotated   PALPATION: Bilateral gluteals    LOWER EXTREMITY ROM:   Active ROM Right eval Left eval  Hip flexion 85 88  Hip extension   Lacking 2 degrees   Hip abduction   15  Hip adduction      Hip internal rotation      Hip external rotation      Knee flexion      Knee extension      Ankle dorsiflexion      Ankle plantarflexion      Ankle inversion      Ankle eversion       (Blank rows = not tested)   LOWER EXTREMITY MMT:   MMT Right eval Left eval RT 03/10/22 LT 03/10/22  Hip flexion 4+ 4 4+ 4+  Hip extension 4 4- 5 5  Hip abduction 4- 3+ 4+ 4-  Hip adduction 5 5    Hip internal rotation        Hip external rotation        Knee flexion        Knee extension        Ankle dorsiflexion        Ankle plantarflexion        Ankle inversion        Ankle eversion         (Blank rows = not tested)   LOWER EXTREMITY SPECIAL TESTS:  Hip special tests: Saralyn Pilar (FABER) test: positive  Ely's positive bilaterally    Leg length: umbilicus to medial malleoli 101 cm LLE 104 cm RLE   FUNCTIONAL TESTS:  5 times sit to stand: 11.2 seconds Timed up and go (TUG): 15  seconds  SLS: 3 seconds each    GAIT: Distance walked: 10 ft  Assistive device utilized: Single point cane Level of assistance: Modified independence Comments: maintains Lt knee in extension, limited hip extension on the LLE, Lt hip adducted, forward flexed posture; right lateral trunk lean        TODAY'S TREATMENT: [facility] Adult PT Treatment:  DATE: 03/10/22 Therapeutic Exercise: Nustep 8 min L6 Omega knee ext 3x10 45# Omega knee flex 3x10 55# STS 3x10 15# TUG MMT Supine clams GTB 3x10 LAQ 2x15 6#   [facility] Adult PT Treatment:                                                DATE: 03/03/22 Therapeutic Exercise: Nustep 6 min L6 Omega knee ext 3x10 45# Omega knee flex 3x10 55# Leg press Cybex 3x10 100# Hip abd Cybex 3x10 22.5#  [facility] Adult PT Treatment:                                                DATE: 02/26/22 Therapeutic Exercise: Nustep 6 min L6 STS 2x10 s hand assist LAQ 2x15 6#  SL hip abd 2x15 6# SL hip clams 2x15 RTB SLR 2x15 6# each Supine Piriformis Stretch x2 20" Supine Figure 4 Piriformis Stretch x2 20"   Gait Training: Verbal cueing for L knee flex when advancing. With VC, pt demonstated appropriate knee flexion for pace and step length   PATIENT EDUCATION:  Education details: see treatment  Person educated: Patient Education method: Explanation, Demonstration, Tactile cues, Verbal cues, and Handouts Education comprehension: verbalized understanding, returned demonstration, verbal cues required, tactile cues required, and needs further education     HOME EXERCISE PROGRAM: Access Code: [email or web address]   ASSESSMENT:   CLINICAL IMPRESSION: Pt is making appropriate progress with pt meeting goals for improved L hip strength and TUG time. PT was continued for LE strengthening. Pt will continue to benefit from skilled PT to address impairments in preparation for upcoming THR.   OBJECTIVE IMPAIRMENTS Abnormal gait, decreased  activity tolerance, decreased balance, decreased mobility, difficulty walking, decreased ROM, decreased strength, hypomobility, impaired flexibility, improper body mechanics, postural dysfunction, and pain.    ACTIVITY LIMITATIONS lifting, bending, standing, squatting, stairs, and locomotion level   PARTICIPATION LIMITATIONS: shopping, community activity, and yard work   PERSONAL FACTORS Age, Past/current experiences, Time since onset of injury/illness/exacerbation, and 1 comorbidity: lumbar herniated disc  are also affecting patient's functional outcome.    REHAB POTENTIAL: Fair previous PT for same condition   CLINICAL DECISION MAKING: Stable/uncomplicated   EVALUATION COMPLEXITY: Low     GOALS: Goals reviewed with patient? No   SHORT TERM GOALS: Target date: 03/02/2022  Patient will be independent and compliant with initial HEP.    Baseline: Goal status: MET   2.  Patient will demonstrate knee flexion on the LLE during his gait cycle.  Baseline: maintains knee extension Goal status: MET     LONG TERM GOALS: Target date: 03/23/2022    Patient will maintain SLS for at least 5 seconds to improve his stability with walking on uneven surfaces.  Baseline:  Goal status: INITIAL   2.  Patient will demonstrate 4/5 Lt hip abductor and extensor strength to improve stability about the chain with walking.  Baseline: see above  Status: 03/10/22= see above  Goal status: MET   3. Patient will be independent with advanced home program to progress/maintain current level of function.  Baseline: initial HEP issued  Goal status: INITIAL   4.  Patient will complete TUG in </=12 seconds to signify a reduction in fall risk.  Baseline: see above Status: TUG=11.6 Goal status: MET     PLAN: PT FREQUENCY: 1x/week   PT DURATION: 6 weeks   PLANNED INTERVENTIONS: Therapeutic exercises, Therapeutic activity, Neuromuscular re-education, Balance training, Gait training, Patient/Family education,  Self Care, Joint mobilization, Stair training, Dry Needling, Cryotherapy, Moist heat, Manual therapy, and Re-evaluation   PLAN FOR NEXT SESSION: hip mobility, hip strengthening, balance   Krystyne Tewksbury MS, PT 03/10/22 8:46 PM

## 2022-03-16 NOTE — Therapy (Signed)
OUTPATIENT PHYSICAL THERAPY TREATMENT NOTE   Patient Name: Calvin [name] MRN: [id number] DOB:[birth year], 61 y.o., male Today's Date: 03/17/2022  PCP: Ladell Pier, MD REFERRING PROVIDER: Maudie Flakes, MD   END OF SESSION:   PT End of Session - 03/17/22 1449     Visit Number 6    Number of Visits 7    Date for PT Re-Evaluation 03/27/22    Authorization Type MCR/MCD    PT Start Time 1448    PT Stop Time 1528    PT Time Calculation (min) 40 min    Equipment Utilized During Treatment Other (comment)   [place]   Activity Tolerance Patient tolerated treatment well    Behavior During Therapy [facility] for tasks assessed/performed                 Past Medical History:  Diagnosis Date   Anxiety    Arthritis    back, shoulders    Constipation    Depression    Disc disorder    Hypertension    Lumbar herniated disc    Muscle spasm    Past Surgical History:  Procedure Laterality Date   ANTERIOR CERVICAL DECOMP/DISCECTOMY FUSION N/A 03/08/2018   Procedure: ANTERIOR CERVICAL DECOMPRESSION/DISCECTOMY FUSION , cervical 3-4, cervical 4-5, cervical 5-6 with intrumentational and allograft;  Surgeon: Phylliss Bob, MD;  Location: [place];  Service: Orthopedics;  Laterality: N/A;   BONE BIOPSY Right 05/17/2018   Procedure: BONE BIOPSY X2;  Surgeon: Evelina Bucy, DPM;  Location: [place];  Service: Podiatry;  Laterality: Right;  right second toe   COLONOSCOPY     FOOT SURGERY     right, ~ 2002   POLYPECTOMY     Patient Active Problem List   Diagnosis Date Noted   Hepatitis C virus infection cured after antiviral drug therapy 09/24/2021   Chronic hepatitis C ([place]) 09/17/2021   Hyperlipidemia 08/05/2021   CKD (chronic kidney disease) stage 3, GFR 30-59 ml/min (HCC) 08/05/2021   Insomnia 08/03/2021   Tobacco dependence 08/03/2021   Opioid use disorder in remission 08/03/2021   Severe sepsis ([place]) 08/07/2019   Lobar pneumonia ([place]) 08/07/2019   Diarrhea  08/07/2019   Anemia 08/07/2019   Foot laceration, right, initial encounter    Radiculopathy 03/08/2018   Tubular adenoma 05/16/2017   Depressed mood 05/16/2017   Substance abuse ([place]) 05/16/2017   Hallux valgus 08/03/2016   Disc disorder    Lumbar facet arthropathy 08/06/2014   Lumbar radiculopathy 08/06/2014   ED (erectile dysfunction) 01/07/2014   Unspecified vitamin D deficiency 01/07/2014   Left shoulder pain 08/10/2013   Chronic low back pain 08/10/2013   Essential hypertension 08/10/2013   Tobacco abuse 08/10/2013    REFERRING DIAG: M16.12 (ICD-10-CM) - Unilateral primary osteoarthritis, left hip   THERAPY DIAG:  Pain in left hip  Difficulty in walking, not elsewhere classified  Muscle weakness (generalized)  Stiffness of left hip, not elsewhere classified  Pain in right hip  Rationale for Evaluation and Treatment Rehabilitation  SUBJECTIVE:    SUBJECTIVE STATEMENT: Patient reports he is doing ok. He reports compliance with HEP.    PAIN:  Are you having pain? Yes: NPRS scale: 4/10 Pain location: bilateral hips (posterolateral) (Lt > Rt)  Pain description: dull ache Aggravating factors: laying down Relieving factors: BC powder, exercise  PERTINENT HISTORY: ACDF 2019    PRECAUTIONS: Fall   WEIGHT BEARING RESTRICTIONS No   FALLS:  Has patient fallen in last 6 months? No  LIVING ENVIRONMENT: Lives with: lives with their family Lives in: House/apartment Stairs: Yes: Internal: 14 steps; on right going up Has following equipment at home: Single point cane   OCCUPATION: unemployed    PLOF: Independent   PATIENT GOALS "I want to go back to work."      OBJECTIVE: (objective measures completed at initial evaluation unless otherwise dated)   DIAGNOSTIC FINDINGS: X-rays: LEFT HIP: No acute fracture or dislocation. Mild left greater than right hip joint space narrowing. Marginal osteophytosis of the femoral head and superolateral  acetabulum. Enthesopathic changes about the greater trochanter. The soft tissues are unremarkable.  LEFT KNEE: No acute fracture or dislocation. The joint spaces are preserved. The soft tissues are unremarkable.  RIGHT KNEE: No acute fracture or dislocation. The joint spaces are preserved. The soft tissues are unremarkable.  BONE LENGTH: Slight left cephalad pelvic tilt. No acute fracture. Asymmetric effective leg length, measuring 88.4 cm on left and 87.4 cm on the right. Left greater than right genu valgum.  Visualized joint spaces are maintained.   PATIENT SURVEYS:  FOTO 54% function to 66% predicted 03/17/22: 57% function    COGNITION:           Overall cognitive status: Within functional limits for tasks assessed                          SENSATION: Not tested   EDEMA:  N/A   MUSCLE LENGTH: Hamstrings: mild tightness bilaterally  Thomas test: positive   POSTURE: Rt knee flexed, trunk forward and rotated   PALPATION: Bilateral gluteals    LOWER EXTREMITY ROM:   Active ROM Right eval Left eval  Hip flexion 85 88  Hip extension   Lacking 2 degrees   Hip abduction   15  Hip adduction      Hip internal rotation      Hip external rotation      Knee flexion      Knee extension      Ankle dorsiflexion      Ankle plantarflexion      Ankle inversion      Ankle eversion       (Blank rows = not tested)   LOWER EXTREMITY MMT:   MMT Right eval Left eval RT 03/10/22 LT 03/10/22  Hip flexion 4+ 4 4+ 4+  Hip extension 4 4- 5 5  Hip abduction 4- 3+ 4+ 4-  Hip adduction 5 5    Hip internal rotation        Hip external rotation        Knee flexion        Knee extension        Ankle dorsiflexion        Ankle plantarflexion        Ankle inversion        Ankle eversion         (Blank rows = not tested)   LOWER EXTREMITY SPECIAL TESTS:  Hip special tests: Saralyn Pilar (FABER) test: positive  Ely's positive bilaterally    Leg length: umbilicus to medial  malleoli 101 cm LLE 104 cm RLE   FUNCTIONAL TESTS:  5 times sit to stand: 11.2 seconds Timed up and go (TUG): 15 seconds  SLS: 3 seconds each    GAIT: Distance walked: 10 ft  Assistive device utilized: Single point cane Level of assistance: Modified independence Comments: maintains Lt knee in extension, limited hip extension on the LLE, Lt hip adducted,  forward flexed posture; right lateral trunk lean        TODAY'S TREATMENT: [facility] Adult PT Treatment:                                                DATE: 03/17/22 Therapeutic Exercise: NuStep level 6 x 5 minutes  March on airex 1 x 10  Standing hip flexion 2 x 10  Body weight squat with BUE support and green band around thighs 2 x 10  Standing hip abduction 2 x 10  Backward walking at counter 2 sets d/b  Updated HEP NMR: Romberg on airex eyes closed 3 x 30 sec  Semi-tandem on airex 2 x 30 sec each Tandem 5-15 seconds x 2 each   [facility] Adult PT Treatment:                                                DATE: 03/10/22 Therapeutic Exercise: Nustep 8 min L6 Omega knee ext 3x10 45# Omega knee flex 3x10 55# STS 3x10 15# TUG MMT Supine clams GTB 3x10 LAQ 2x15 6#   [facility] Adult PT Treatment:                                                DATE: 03/03/22 Therapeutic Exercise: Nustep 6 min L6 Omega knee ext 3x10 45# Omega knee flex 3x10 55# Leg press Cybex 3x10 100# Hip abd Cybex 3x10 22.5#    PATIENT EDUCATION:  Education details: FOTO, HEP Person educated: Patient Education method: Explanation, demo, cues Education comprehension: verbalized understanding, returned demo, cues      HOME EXERCISE PROGRAM: Access Code: [email or web address]   ASSESSMENT:   CLINICAL IMPRESSION: Patient tolerated session well today focusing on progression standing strengthening and static balance training. Minimal sway noted during romberg and semi-tandem balance, but he has difficulty maintaining balance with tandem and SL activity requiring frequent use of  reaching strategy to maintain balance. He demonstrates improved ability to maintain upright posture during standing activity, though continues to have difficulty controlling for LLE adduction.    OBJECTIVE IMPAIRMENTS Abnormal gait, decreased activity tolerance, decreased balance, decreased mobility, difficulty walking, decreased ROM, decreased strength, hypomobility, impaired flexibility, improper body mechanics, postural dysfunction, and pain.    ACTIVITY LIMITATIONS lifting, bending, standing, squatting, stairs, and locomotion level   PARTICIPATION LIMITATIONS: shopping, community activity, and yard work   PERSONAL FACTORS Age, Past/current experiences, Time since onset of injury/illness/exacerbation, and 1 comorbidity: lumbar herniated disc  are also affecting patient's functional outcome.    REHAB POTENTIAL: Fair previous PT for same condition   CLINICAL DECISION MAKING: Stable/uncomplicated   EVALUATION COMPLEXITY: Low     GOALS: Goals reviewed with patient? No   SHORT TERM GOALS: Target date: 03/02/2022  Patient will be independent and compliant with initial HEP.    Baseline: Goal status: MET   2.  Patient will demonstrate knee flexion on the LLE during his gait cycle.  Baseline: maintains knee extension Goal status: MET     LONG TERM GOALS: Target date: 03/23/2022    Patient will maintain SLS for at least 5 seconds  to improve his stability with walking on uneven surfaces.  Baseline:  Goal status: INITIAL   2.  Patient will demonstrate 4/5 Lt hip abductor and extensor strength to improve stability about the chain with walking.  Baseline: see above  Status: 03/10/22= see above  Goal status: MET   3. Patient will be independent with advanced home program to progress/maintain current level of function.  Baseline: initial HEP issued  Goal status: INITIAL   4.  Patient will complete TUG in </=12 seconds to signify a reduction in fall risk.  Baseline: see above Status:  TUG=11.6 Goal status: MET     PLAN: PT FREQUENCY: 1x/week   PT DURATION: 6 weeks   PLANNED INTERVENTIONS: Therapeutic exercises, Therapeutic activity, Neuromuscular re-education, Balance training, Gait training, Patient/Family education, Self Care, Joint mobilization, Stair training, Dry Needling, Cryotherapy, Moist heat, Manual therapy, and Re-evaluation   PLAN FOR NEXT SESSION: hip mobility, hip strengthening, balance  Gwendolyn Grant, PT, DPT, ATC 03/17/22 3:29 PM

## 2022-03-17 ENCOUNTER — Ambulatory Visit: Payer: Medicare Other

## 2022-03-17 DIAGNOSIS — M25652 Stiffness of left hip, not elsewhere classified: Secondary | ICD-10-CM

## 2022-03-17 DIAGNOSIS — M545 Low back pain, unspecified: Secondary | ICD-10-CM | POA: Diagnosis not present

## 2022-03-17 DIAGNOSIS — M6281 Muscle weakness (generalized): Secondary | ICD-10-CM | POA: Diagnosis not present

## 2022-03-17 DIAGNOSIS — R262 Difficulty in walking, not elsewhere classified: Secondary | ICD-10-CM

## 2022-03-17 DIAGNOSIS — M25551 Pain in right hip: Secondary | ICD-10-CM

## 2022-03-17 DIAGNOSIS — M25552 Pain in left hip: Secondary | ICD-10-CM | POA: Diagnosis not present

## 2022-03-17 DIAGNOSIS — G8929 Other chronic pain: Secondary | ICD-10-CM | POA: Diagnosis not present

## 2022-03-18 ENCOUNTER — Other Ambulatory Visit: Payer: Self-pay | Admitting: Internal Medicine

## 2022-03-18 ENCOUNTER — Other Ambulatory Visit: Payer: Self-pay

## 2022-03-18 DIAGNOSIS — R252 Cramp and spasm: Secondary | ICD-10-CM

## 2022-03-18 MED ORDER — CYCLOBENZAPRINE HCL 5 MG PO TABS
5.0000 mg | ORAL_TABLET | Freq: Two times a day (BID) | ORAL | 0 refills | Status: DC | PRN
  Filled 2022-03-18: qty 30, 15d supply, fill #0

## 2022-03-18 NOTE — Telephone Encounter (Signed)
Requested medication (s) are due for refill today - yes  Requested medication (s) are on the active medication list -yes  Future visit scheduled -no  Last refill: 12/31/21 #30  Notes to clinic: non delegated Rx  Requested Prescriptions  Pending Prescriptions Disp Refills   cyclobenzaprine (FLEXERIL) 5 MG tablet 30 tablet 0    Sig: Take 1 tablet (5 mg total) by mouth 2 (two) times daily as needed for muscle spasms.     Not Delegated - Analgesics:  Muscle Relaxants Failed - 03/18/2022  9:01 AM      Failed - This refill cannot be delegated      Passed - Valid encounter within last 6 months    Recent Outpatient Visits           5 months ago Encounter for Medicare annual wellness exam   [place] Ladell Pier, MD   7 months ago Essential hypertension   [place] Ladell Pier, MD   1 year ago Essential hypertension   [place] Ladell Pier, MD   1 year ago Essential hypertension   [place] Ladell Pier, MD   1 year ago Essential hypertension   [place], MD                 Requested Prescriptions  Pending Prescriptions Disp Refills   cyclobenzaprine (FLEXERIL) 5 MG tablet 30 tablet 0    Sig: Take 1 tablet (5 mg total) by mouth 2 (two) times daily as needed for muscle spasms.     Not Delegated - Analgesics:  Muscle Relaxants Failed - 03/18/2022  9:01 AM      Failed - This refill cannot be delegated      Passed - Valid encounter within last 6 months    Recent Outpatient Visits           5 months ago Encounter for [employer] annual wellness exam   [place] Ladell Pier, MD   7 months ago Essential hypertension   [place] Ladell Pier, MD   1 year ago Essential hypertension   [place] Ladell Pier, MD   1 year ago Essential hypertension   [place] Ladell Pier, MD   1 year ago Essential hypertension   [place] Ladell Pier, MD

## 2022-03-24 ENCOUNTER — Ambulatory Visit: Payer: Medicare Other

## 2022-03-24 DIAGNOSIS — R262 Difficulty in walking, not elsewhere classified: Secondary | ICD-10-CM

## 2022-03-24 DIAGNOSIS — M6281 Muscle weakness (generalized): Secondary | ICD-10-CM | POA: Diagnosis not present

## 2022-03-24 DIAGNOSIS — M25652 Stiffness of left hip, not elsewhere classified: Secondary | ICD-10-CM | POA: Diagnosis not present

## 2022-03-24 DIAGNOSIS — M545 Low back pain, unspecified: Secondary | ICD-10-CM | POA: Diagnosis not present

## 2022-03-24 DIAGNOSIS — M25551 Pain in right hip: Secondary | ICD-10-CM | POA: Diagnosis not present

## 2022-03-24 DIAGNOSIS — G8929 Other chronic pain: Secondary | ICD-10-CM | POA: Diagnosis not present

## 2022-03-24 DIAGNOSIS — M25552 Pain in left hip: Secondary | ICD-10-CM

## 2022-03-24 NOTE — Therapy (Signed)
OUTPATIENT PHYSICAL THERAPY TREATMENT NOTE   Patient Name: Calvin [name] MRN: [id number] DOB:July 21, 1961, 61 y.o., male Today's Date: 03/24/2022  PCP: Ladell Pier, MD REFERRING PROVIDER: Maudie Flakes, MD  PHYSICAL THERAPY DISCHARGE SUMMARY  Visits from Start of Care: 7  Current functional level related to goals / functional outcomes: See goals below   Remaining deficits: Limited bilateral hip ROM, hip weakness, gait abnormalities. (See impression)    Education / Equipment: See education   Patient agrees to discharge. Patient goals were partially met. Patient is being discharged due to maximized rehab potential.   END OF SESSION:   PT End of Session - 03/24/22 1447     Visit Number 7    Number of Visits 7    Date for PT Re-Evaluation 03/27/22    Authorization Type MCR/MCD    PT Start Time 1447    PT Stop Time 1513    PT Time Calculation (min) 26 min    Equipment Utilized During Treatment Other (comment)   SPC   Activity Tolerance Patient tolerated treatment well    Behavior During Therapy WFL for tasks assessed/performed                  Past Medical History:  Diagnosis Date   Anxiety    Arthritis    back, shoulders    Constipation    Depression    Disc disorder    Hypertension    Lumbar herniated disc    Muscle spasm    Past Surgical History:  Procedure Laterality Date   ANTERIOR CERVICAL DECOMP/DISCECTOMY FUSION N/A 03/08/2018   Procedure: ANTERIOR CERVICAL DECOMPRESSION/DISCECTOMY FUSION , cervical 3-4, cervical 4-5, cervical 5-6 with intrumentational and allograft;  Surgeon: Phylliss Bob, MD;  Location: [place];  Service: Orthopedics;  Laterality: N/A;   BONE BIOPSY Right 05/17/2018   Procedure: BONE BIOPSY X2;  Surgeon: Evelina Bucy, DPM;  Location: [place];  Service: Podiatry;  Laterality: Right;  right second toe   COLONOSCOPY     FOOT SURGERY     right, ~ 2002   POLYPECTOMY     Patient Active Problem List    Diagnosis Date Noted   Hepatitis C virus infection cured after antiviral drug therapy 09/24/2021   Chronic hepatitis C ([place]) 09/17/2021   Hyperlipidemia 08/05/2021   CKD (chronic kidney disease) stage 3, GFR 30-59 ml/min (HCC) 08/05/2021   Insomnia 08/03/2021   Tobacco dependence 08/03/2021   Opioid use disorder in remission 08/03/2021   Severe sepsis ([place]) 08/07/2019   Lobar pneumonia ([place]) 08/07/2019   Diarrhea 08/07/2019   Anemia 08/07/2019   Foot laceration, right, initial encounter    Radiculopathy 03/08/2018   Tubular adenoma 05/16/2017   Depressed mood 05/16/2017   Substance abuse ([place]) 05/16/2017   Hallux valgus 08/03/2016   Disc disorder    Lumbar facet arthropathy 08/06/2014   Lumbar radiculopathy 08/06/2014   ED (erectile dysfunction) 01/07/2014   Unspecified vitamin D deficiency 01/07/2014   Left shoulder pain 08/10/2013   Chronic low back pain 08/10/2013   Essential hypertension 08/10/2013   Tobacco abuse 08/10/2013    REFERRING DIAG: M16.12 (ICD-10-CM) - Unilateral primary osteoarthritis, left hip   THERAPY DIAG:  Pain in left hip  Difficulty in walking, not elsewhere classified  Muscle weakness (generalized)  Stiffness of left hip, not elsewhere classified  Pain in right hip  Rationale for Evaluation and Treatment Rehabilitation  SUBJECTIVE:    SUBJECTIVE STATEMENT: Patient reports the pain comes and goes  in his hips. When he sits he feels like "the hip bone is out of place." He reports continued difficulty with prolonged standing and has significant stiffness upon first waking in the morning.    PAIN:  Are you having pain? Yes: NPRS scale: 4/10 Pain location: bilateral hips (posterolateral) (Lt > Rt)  Pain description: dull ache Aggravating factors: laying down Relieving factors: BC powder, exercise  PERTINENT HISTORY: ACDF 2019    PRECAUTIONS: Fall   WEIGHT BEARING RESTRICTIONS No   FALLS:  Has patient fallen in last 6 months? No    LIVING ENVIRONMENT: Lives with: lives with their family Lives in: House/apartment Stairs: Yes: Internal: 14 steps; on right going up Has following equipment at home: Single point cane   OCCUPATION: unemployed    PLOF: Independent   PATIENT GOALS "I want to go back to work."      OBJECTIVE: (objective measures completed at initial evaluation unless otherwise dated)   DIAGNOSTIC FINDINGS: X-rays: LEFT HIP: No acute fracture or dislocation. Mild left greater than right hip joint space narrowing. Marginal osteophytosis of the femoral head and superolateral acetabulum. Enthesopathic changes about the greater trochanter. The soft tissues are unremarkable.  LEFT KNEE: No acute fracture or dislocation. The joint spaces are preserved. The soft tissues are unremarkable.  RIGHT KNEE: No acute fracture or dislocation. The joint spaces are preserved. The soft tissues are unremarkable.  BONE LENGTH: Slight left cephalad pelvic tilt. No acute fracture. Asymmetric effective leg length, measuring 88.4 cm on left and 87.4 cm on the right. Left greater than right genu valgum.  Visualized joint spaces are maintained.   PATIENT SURVEYS:  FOTO 54% function to 66% predicted 03/17/22: 57% function  03/24/22: 46% function    COGNITION:           Overall cognitive status: Within functional limits for tasks assessed                          SENSATION: Not tested   EDEMA:  N/A   MUSCLE LENGTH: Hamstrings: mild tightness bilaterally  Thomas test: positive   POSTURE: Rt knee flexed, trunk forward and rotated   PALPATION: Bilateral gluteals    LOWER EXTREMITY ROM:   Active ROM Right eval Left eval 03/24/22  Hip flexion 85 88 Rt: 85; Lt: 88  Hip extension   Lacking 2 degrees  Lt: 0  Hip abduction   15 Lt: 13  Hip adduction       Hip internal rotation       Hip external rotation       Knee flexion       Knee extension       Ankle dorsiflexion       Ankle plantarflexion        Ankle inversion       Ankle eversion        (Blank rows = not tested)   LOWER EXTREMITY MMT:   MMT Right eval Left eval RT 03/10/22 LT 03/10/22 03/24/22  Hip flexion 4+ 4 4+ 4+ Lt: 4+; Rt: 5   Hip extension 4 4- 5 5 Rt: 4+; Lt: 4/5  Hip abduction 4- 3+ 4+ 4- Rt: 4+; Lt: 4-  Hip adduction 5 5     Hip internal rotation         Hip external rotation         Knee flexion         Knee extension  Ankle dorsiflexion         Ankle plantarflexion         Ankle inversion         Ankle eversion          (Blank rows = not tested)   LOWER EXTREMITY SPECIAL TESTS:  Hip special tests: Saralyn Pilar (FABER) test: positive  Ely's positive bilaterally    Leg length: umbilicus to medial malleoli 101 cm LLE 104 cm RLE   FUNCTIONAL TESTS:  5 times sit to stand: 11.2 seconds; 03/24/22: 11.5 seconds  Timed up and go (TUG): 15 seconds; 03/24/22: 13.3 seconds  SLS: 3 seconds each; 03/24/22 Rt: 6 seconds; Lt 6 seconds    GAIT: Distance walked: 10 ft  Assistive device utilized: Single point cane Level of assistance: Modified independence Comments: maintains Lt knee in extension, limited hip extension on the LLE, Lt hip adducted, forward flexed posture; right lateral trunk lean    03/24/22: Mod I with SPC; maintains Lt knee in extension, limited hip extension on LLE, limited toe-off      TODAY'S TREATMENT: [facility] Adult PT Treatment:                                                DATE: 03/24/22 Therapeutic Exercise: Reviewed and updated HEP discussing frequency and progression  Therapeutic Activity: Re-assessment to determine overall progress educating patient on progress towards goals   [facility] Adult PT Treatment:                                                DATE: 03/17/22 Therapeutic Exercise: NuStep level 6 x 5 minutes  March on airex 1 x 10  Standing hip flexion 2 x 10  Body weight squat with BUE support and green band around thighs 2 x 10  Standing hip abduction 2 x 10  Backward walking at  counter 2 sets d/b  Updated HEP NMR: Romberg on airex eyes closed 3 x 30 sec  Semi-tandem on airex 2 x 30 sec each Tandem 5-15 seconds x 2 each   [facility] Adult PT Treatment:                                                DATE: 03/10/22 Therapeutic Exercise: Nustep 8 min L6 Omega knee ext 3x10 45# Omega knee flex 3x10 55# STS 3x10 15# TUG MMT Supine clams GTB 3x10 LAQ 2x15 6#    PATIENT EDUCATION:  Education details: see treatment; D/C education  Person educated: Patient Education method: Consulting civil engineer, demo, cues Education comprehension: verbalized understanding, returned demo, cues      HOME EXERCISE PROGRAM: Access Code: [email or web address]   ASSESSMENT:   CLINICAL IMPRESSION: Patient has completed 7 PT visits since the start of care demonstrating slight improvements in hip strength and balance. His hip AROM has remained unchanged since the start of care with significant limitations into all planes present. His limited hip range is a large factor in his ongoing abnormal gait mechanics as he does not have the necessary amount of hip flexion and extension to allow for a normal gait pattern at  this time. He has met the majority of his functional goals, but given his ongoing range of motion limitations and weakness after 3 different rounds of PT, it was recommended that he f/u with orthopedics for further assessment. Patient is appropriate for discharge and is in agreement with this plan.    OBJECTIVE IMPAIRMENTS Abnormal gait, decreased activity tolerance, decreased balance, decreased mobility, difficulty walking, decreased ROM, decreased strength, hypomobility, impaired flexibility, improper body mechanics, postural dysfunction, and pain.    ACTIVITY LIMITATIONS lifting, bending, standing, squatting, stairs, and locomotion level   PARTICIPATION LIMITATIONS: shopping, community activity, and yard work   PERSONAL FACTORS Age, Past/current experiences, Time since onset of  injury/illness/exacerbation, and 1 comorbidity: lumbar herniated disc  are also affecting patient's functional outcome.    REHAB POTENTIAL: Fair previous PT for same condition   CLINICAL DECISION MAKING: Stable/uncomplicated   EVALUATION COMPLEXITY: Low     GOALS: Goals reviewed with patient? No   SHORT TERM GOALS: Target date: 03/02/2022  Patient will be independent and compliant with initial HEP.    Baseline: Goal status: MET   2.  Patient will demonstrate knee flexion on the LLE during his gait cycle.  Baseline: maintains knee extension Goal status: not met      LONG TERM GOALS: Target date: 03/23/2022    Patient will maintain SLS for at least 5 seconds to improve his stability with walking on uneven surfaces.  Baseline:  Goal status: met   2.  Patient will demonstrate 4/5 Lt hip abductor and extensor strength to improve stability about the chain with walking.  Baseline: see above   Goal status: partially met    3. Patient will be independent with advanced home program to progress/maintain current level of function.  Baseline: initial HEP issued  Goal status: met   4.  Patient will complete TUG in </=12 seconds to signify a reduction in fall risk.  Baseline: see above Goal status: nearly met     PLAN: PT FREQUENCY: 1x/week   PT DURATION: 6 weeks   PLANNED INTERVENTIONS: Therapeutic exercises, Therapeutic activity, Neuromuscular re-education, Balance training, Gait training, Patient/Family education, Self Care, Joint mobilization, Stair training, Dry Needling, Cryotherapy, Moist heat, Manual therapy, and Re-evaluation   PLAN FOR NEXT SESSION: N/A D/C   Gwendolyn Grant, PT, DPT, ATC 03/24/22 3:20 PM

## 2022-04-12 DIAGNOSIS — M1612 Unilateral primary osteoarthritis, left hip: Secondary | ICD-10-CM | POA: Diagnosis not present

## 2022-04-12 DIAGNOSIS — Z720 Tobacco use: Secondary | ICD-10-CM | POA: Diagnosis not present

## 2022-04-21 ENCOUNTER — Other Ambulatory Visit: Payer: Self-pay | Admitting: Internal Medicine

## 2022-04-21 ENCOUNTER — Other Ambulatory Visit: Payer: Self-pay

## 2022-04-21 DIAGNOSIS — R252 Cramp and spasm: Secondary | ICD-10-CM

## 2022-04-21 NOTE — Telephone Encounter (Signed)
Requested medication (s) are due for refill today:   Provider to review  Requested medication (s) are on the active medication list:   Yes  Future visit scheduled:   No   Last ordered: 03/18/2022 #30, 0 refills  Returned because it's a not delegated refill    Requested Prescriptions  Pending Prescriptions Disp Refills   cyclobenzaprine (FLEXERIL) 5 MG tablet 30 tablet 0    Sig: Take 1 tablet (5 mg total) by mouth 2 (two) times daily as needed for muscle spasms.     Not Delegated - Analgesics:  Muscle Relaxants Failed - 04/21/2022  3:16 PM      Failed - This refill cannot be delegated      Failed - Valid encounter within last 6 months    Recent Outpatient Visits           6 months ago Encounter for Medicare annual wellness exam   [place] Ladell Pier, MD   8 months ago Essential hypertension   [place] Ladell Pier, MD   1 year ago Essential hypertension   [place] Ladell Pier, MD   1 year ago Essential hypertension   [place], Deborah B, MD   2 years ago Essential hypertension   [place], Deborah B, MD

## 2022-04-23 ENCOUNTER — Other Ambulatory Visit: Payer: Self-pay

## 2022-04-26 ENCOUNTER — Other Ambulatory Visit: Payer: Self-pay

## 2022-04-27 DIAGNOSIS — F17200 Nicotine dependence, unspecified, uncomplicated: Secondary | ICD-10-CM | POA: Diagnosis not present

## 2022-04-27 DIAGNOSIS — G8929 Other chronic pain: Secondary | ICD-10-CM | POA: Diagnosis not present

## 2022-04-27 DIAGNOSIS — M545 Low back pain, unspecified: Secondary | ICD-10-CM | POA: Diagnosis not present

## 2022-04-27 DIAGNOSIS — I10 Essential (primary) hypertension: Secondary | ICD-10-CM | POA: Diagnosis not present

## 2022-05-03 ENCOUNTER — Other Ambulatory Visit: Payer: Self-pay

## 2022-05-05 ENCOUNTER — Other Ambulatory Visit: Payer: Self-pay

## 2022-05-05 MED ORDER — INFLUENZA VAC SPLIT QUAD 0.5 ML IM SUSY
PREFILLED_SYRINGE | INTRAMUSCULAR | 0 refills | Status: DC
  Filled 2022-05-05: qty 0.5, 1d supply, fill #0

## 2022-05-06 ENCOUNTER — Other Ambulatory Visit: Payer: Self-pay

## 2022-05-11 DIAGNOSIS — F17210 Nicotine dependence, cigarettes, uncomplicated: Secondary | ICD-10-CM | POA: Diagnosis not present

## 2022-05-26 DIAGNOSIS — I10 Essential (primary) hypertension: Secondary | ICD-10-CM | POA: Diagnosis not present

## 2022-05-26 DIAGNOSIS — F32A Depression, unspecified: Secondary | ICD-10-CM | POA: Diagnosis not present

## 2022-05-26 DIAGNOSIS — G8929 Other chronic pain: Secondary | ICD-10-CM | POA: Diagnosis not present

## 2022-05-26 DIAGNOSIS — M545 Low back pain, unspecified: Secondary | ICD-10-CM | POA: Diagnosis not present

## 2022-05-26 DIAGNOSIS — F17210 Nicotine dependence, cigarettes, uncomplicated: Secondary | ICD-10-CM | POA: Diagnosis not present

## 2022-05-27 DIAGNOSIS — Z5309 Procedure and treatment not carried out because of other contraindication: Secondary | ICD-10-CM | POA: Diagnosis not present

## 2022-05-27 DIAGNOSIS — Z981 Arthrodesis status: Secondary | ICD-10-CM | POA: Diagnosis not present

## 2022-05-27 DIAGNOSIS — M1612 Unilateral primary osteoarthritis, left hip: Secondary | ICD-10-CM | POA: Diagnosis not present

## 2022-05-27 DIAGNOSIS — M25552 Pain in left hip: Secondary | ICD-10-CM | POA: Diagnosis not present

## 2022-05-27 DIAGNOSIS — Z87891 Personal history of nicotine dependence: Secondary | ICD-10-CM | POA: Diagnosis not present

## 2022-05-27 DIAGNOSIS — Z79899 Other long term (current) drug therapy: Secondary | ICD-10-CM | POA: Diagnosis not present

## 2022-05-27 DIAGNOSIS — Z888 Allergy status to other drugs, medicaments and biological substances status: Secondary | ICD-10-CM | POA: Diagnosis not present

## 2022-05-27 DIAGNOSIS — Z791 Long term (current) use of non-steroidal anti-inflammatories (NSAID): Secondary | ICD-10-CM | POA: Diagnosis not present

## 2022-08-20 ENCOUNTER — Telehealth: Payer: Self-pay | Admitting: Emergency Medicine

## 2022-08-20 NOTE — Telephone Encounter (Signed)
Copied from [place] [phone]. Topic: General - Inquiry >> Aug 20, 2022  1:27 PM Penni Bombard wrote: Reason for CRM: Cailin pt's caretaker called asking if the office could give him Sublocade injections.  He is a patient at [facility].  They will do the prescribing and have the medication sent to the office.  CB#  [phone]

## 2022-08-23 ENCOUNTER — Other Ambulatory Visit: Payer: Self-pay

## 2022-08-23 ENCOUNTER — Other Ambulatory Visit (HOSPITAL_COMMUNITY): Payer: Self-pay

## 2022-08-23 NOTE — Telephone Encounter (Signed)
Routing to PCP to advise

## 2022-08-25 NOTE — Telephone Encounter (Signed)
Call placed to CB#  [phone] asked for Cailin d/c planner for [facility]. Message left on VM that Clarification of pervious message needed, before  a discussion to give  Sublocade injection prescription could be sent as requested.

## 2022-09-20 DIAGNOSIS — M1612 Unilateral primary osteoarthritis, left hip: Secondary | ICD-10-CM | POA: Diagnosis not present

## 2022-09-20 DIAGNOSIS — M25552 Pain in left hip: Secondary | ICD-10-CM | POA: Diagnosis not present

## 2022-09-23 ENCOUNTER — Telehealth: Payer: Self-pay | Admitting: Internal Medicine

## 2022-09-23 NOTE — Telephone Encounter (Signed)
Reason for CRM: Called patient to schedule Medicare Annual Wellness Visit (AWV). Unable to reach patient.  Last date of AWV: 10/15/21   If any questions, please contact me at [phone].  Thank you ,  Barkley Boards AWV direct phone # [phone]

## 2022-09-29 DIAGNOSIS — M1612 Unilateral primary osteoarthritis, left hip: Secondary | ICD-10-CM | POA: Diagnosis not present

## 2022-09-29 DIAGNOSIS — M25552 Pain in left hip: Secondary | ICD-10-CM | POA: Diagnosis not present

## 2022-11-02 DIAGNOSIS — R6889 Other general symptoms and signs: Secondary | ICD-10-CM | POA: Diagnosis not present

## 2022-11-04 ENCOUNTER — Other Ambulatory Visit: Payer: Self-pay

## 2022-11-04 ENCOUNTER — Other Ambulatory Visit: Payer: Self-pay | Admitting: Internal Medicine

## 2022-11-04 DIAGNOSIS — N528 Other male erectile dysfunction: Secondary | ICD-10-CM

## 2022-11-05 ENCOUNTER — Other Ambulatory Visit: Payer: Self-pay

## 2022-11-08 ENCOUNTER — Ambulatory Visit: Payer: Medicare HMO | Attending: Internal Medicine | Admitting: Pharmacist

## 2022-11-08 DIAGNOSIS — Z79899 Other long term (current) drug therapy: Secondary | ICD-10-CM

## 2022-11-08 NOTE — Progress Notes (Signed)
11/08/2022 Name: Calvin [name] MRN: [id number] DOB: May 10, 1961  No chief complaint on file.  Calvin [name] is a 62 y.o. year old male who presented for a telephone visit.   They were referred to the pharmacist by a quality report for assistance in managing medication access.   Patient is participating in a Managed Medicaid Plan:  no  Subjective:  Care Team: Primary Care Provider: Marcine Matar, MD ; Next Scheduled Visit: 02/15/2023. Last visit was >1 year ago.   Medication Access/Adherence  Current Pharmacy:  [facility] [address] [place] [address] Phone: [phone] Fax: [phone]   Patient reports affordability concerns with their medications: No  Patient reports access/transportation concerns to their pharmacy: No  Patient reports adherence concerns with their medications:  Yes . Patient tells me that he is no longer taking valsartan, HCTZ, or atorvastatin. It looks like the atorvastatin was started when his cholesterol was checked 08/03/2021. LDL at the time was >100 with a 10=yr ASCVD risk of 33.4%. BP was last checked in this clinic 09/24/2021 and was 126/77 mmHg. He was noted to be on HCTZ and valsartan at that time but is adamant today that his BP at home is okay and he has not taken these in >1 month.    Medication Management:  Current adherence strategy: none  Patient reports Poor adherence to medications. He failed the hypertension and cholesterol medication-related adherence measures for [name] in 2023. He is not currently taking atorvastatin, HCTZ, or valsartan.   Patient reports the following barriers to adherence: none  Objective:  Lab Results  Component Value Date   HGBA1C 5.6 07/06/2016    Lab Results  Component Value Date   CREATININE 1.34 (H) 09/24/2021   BUN 14 09/24/2021   NA 143 09/24/2021   K 4.6 09/24/2021   CL 105 09/24/2021   CO2 26 09/24/2021    Lab Results  Component  Value Date   CHOL 195 08/03/2021   HDL 56 08/03/2021   LDLCALC 129 (H) 08/03/2021   TRIG 56 08/03/2021   CHOLHDL 3.5 08/03/2021    Medications Reviewed Today     Reviewed by Calvin [name], PT (Physical Therapist) on 03/24/22 at 1447  Med List Status: <None>   Medication Order Taking? Sig Documenting Provider Last Dose Status Informant  atorvastatin (LIPITOR) 10 MG tablet [id number]  Take 1 tablet (10 mg total) by mouth daily. Marcine Matar, MD  Active   cyclobenzaprine (FLEXERIL) 5 MG tablet [id number]  Take 1 tablet (5 mg total) by mouth 2 (two) times daily as needed for muscle spasms. Marcine Matar, MD  Active   gabapentin (NEURONTIN) 300 MG capsule [id number] No Take 1 capsule by mouth 3 (three) times daily. [provider] Taking Active   hydrochlorothiazide (MICROZIDE) 12.5 MG capsule [id number] No Take 1 capsule (12.5 mg total) by mouth daily. Marcine Matar, MD Taking Active   melatonin 3 MG TABS tablet [id number] No Take 1 tablet (3 mg total) by mouth at bedtime. Marcine Matar, MD Taking Active   nicotine (NICODERM CQ - DOSED IN MG/24 HR) 7 mg/24hr patch [id number] No Place 1 patch (7 mg total) onto the skin daily.  Patient not taking: Reported on 02/08/2022   Marcine Matar, MD Not Taking Active   sildenafil (VIAGRA) 100 MG tablet [id number] No Take 1 tablet by mouth 1/2 hr prior to intercourse as needed. Max 1 tab/24 hr Marcine Matar,  MD Taking Active   SUBLOCADE 300 MG/1.5ML SOSY [id number] No Inject into the skin.  Patient not taking: Reported on 02/08/2022   [provider] Not Taking Active   valsartan (DIOVAN) 40 MG tablet [id number] No Take 1 tablet (40 mg total) by mouth daily. Marcine Matar, MD Taking Active               Assessment/Plan:   Medication Management: - Currently strategy insufficient to maintain appropriate adherence to prescribed medication regimen.  - I will remove the atorvastatin, valsartan, and HCTZ  for now. However, I strongly recommended he discuss this with his provider on 02/15/2023. With his ASCVD risk  and last LDL, I recommend to resume his statin but he will wait to discuss this with Dr. Laural Benes.  - Suggested use of weekly pill box to organize medications - Created list of medication, indication, and administration time. Provided to patient  Follow Up Plan: in July with PCP.  Butch Penny, PharmD, [place], CPP Clinical Pharmacist [facility] [phone]

## 2022-11-09 ENCOUNTER — Other Ambulatory Visit: Payer: Self-pay | Admitting: Internal Medicine

## 2022-11-09 ENCOUNTER — Other Ambulatory Visit: Payer: Self-pay

## 2022-11-09 DIAGNOSIS — N528 Other male erectile dysfunction: Secondary | ICD-10-CM

## 2022-11-11 DIAGNOSIS — R6889 Other general symptoms and signs: Secondary | ICD-10-CM | POA: Diagnosis not present

## 2022-11-15 DIAGNOSIS — R6889 Other general symptoms and signs: Secondary | ICD-10-CM | POA: Diagnosis not present

## 2022-11-22 DIAGNOSIS — M1652 Unilateral post-traumatic osteoarthritis, left hip: Secondary | ICD-10-CM | POA: Diagnosis not present

## 2022-11-22 DIAGNOSIS — M1612 Unilateral primary osteoarthritis, left hip: Secondary | ICD-10-CM | POA: Diagnosis not present

## 2022-11-22 DIAGNOSIS — R6889 Other general symptoms and signs: Secondary | ICD-10-CM | POA: Diagnosis not present

## 2022-11-22 DIAGNOSIS — M5440 Lumbago with sciatica, unspecified side: Secondary | ICD-10-CM | POA: Diagnosis not present

## 2022-11-22 DIAGNOSIS — M160 Bilateral primary osteoarthritis of hip: Secondary | ICD-10-CM | POA: Diagnosis not present

## 2022-12-15 DIAGNOSIS — R6889 Other general symptoms and signs: Secondary | ICD-10-CM | POA: Diagnosis not present

## 2022-12-15 DIAGNOSIS — Z01818 Encounter for other preprocedural examination: Secondary | ICD-10-CM | POA: Diagnosis not present

## 2022-12-15 DIAGNOSIS — M160 Bilateral primary osteoarthritis of hip: Secondary | ICD-10-CM | POA: Diagnosis not present

## 2022-12-15 DIAGNOSIS — M1612 Unilateral primary osteoarthritis, left hip: Secondary | ICD-10-CM | POA: Diagnosis not present

## 2022-12-30 DIAGNOSIS — G8929 Other chronic pain: Secondary | ICD-10-CM | POA: Diagnosis not present

## 2022-12-30 DIAGNOSIS — M545 Low back pain, unspecified: Secondary | ICD-10-CM | POA: Diagnosis not present

## 2022-12-30 DIAGNOSIS — I10 Essential (primary) hypertension: Secondary | ICD-10-CM | POA: Diagnosis not present

## 2022-12-30 DIAGNOSIS — F1191 Opioid use, unspecified, in remission: Secondary | ICD-10-CM | POA: Diagnosis not present

## 2022-12-30 DIAGNOSIS — R6889 Other general symptoms and signs: Secondary | ICD-10-CM | POA: Diagnosis not present

## 2022-12-30 DIAGNOSIS — F1910 Other psychoactive substance abuse, uncomplicated: Secondary | ICD-10-CM | POA: Diagnosis not present

## 2023-01-04 ENCOUNTER — Ambulatory Visit: Payer: Medicare HMO | Attending: Internal Medicine

## 2023-01-04 VITALS — Ht 73.0 in | Wt 171.0 lb

## 2023-01-04 DIAGNOSIS — Z0000 Encounter for general adult medical examination without abnormal findings: Secondary | ICD-10-CM

## 2023-01-04 NOTE — Patient Instructions (Addendum)
Calvin [name] , Thank you for taking time to come for your Medicare Wellness Visit. I appreciate your ongoing commitment to your health goals. Please review the following plan we discussed and let me know if I can assist you in the future.   These are the goals we discussed:  Goals      Recover from hip surgery and increase mobility        This is a list of the screening recommended for you and due dates:  Health Maintenance  Topic Date Due   COVID-19 Vaccine (4 - 2023-24 season) 03/26/2022   Flu Shot  02/24/2023   Medicare Annual Wellness Visit  01/04/2024   DTaP/Tdap/Td vaccine (2 - Td or Tdap) 06/25/2025   Colon Cancer Screening  02/26/2027   Hepatitis C Screening  Completed   HIV Screening  Completed   Zoster (Shingles) Vaccine  Completed   HPV Vaccine  Aged Out    Advanced directives: Information on Advanced Care Planning can be found at [facility] of [facility] Advance Health Care Directives Advance Health Care Directives ([email or web address]  Please bring a copy of your health care power of attorney and living will to the office to be added to your chart at your convenience.  Conditions/risks identified: Aim for 30 minutes of exercise or brisk walking, 6-8 glasses of water, and 5 servings of fruits and vegetables each day.   Next appointment: Follow up in one year for your annual wellness visit   Preventive Care 40-64 Years, Male Preventive care refers to lifestyle choices and visits with your health care provider that can promote health and wellness. What does preventive care include? A yearly physical exam. This is also called an annual well check. Dental exams once or twice a year. Routine eye exams. Ask your health care provider how often you should have your eyes checked. Personal lifestyle choices, including: Daily care of your teeth and gums. Regular physical activity. Eating a healthy diet. Avoiding tobacco and drug use. Limiting alcohol use. Practicing safe  sex. Taking low-dose aspirin every day starting at age 38. What happens during an annual well check? The services and screenings done by your health care provider during your annual well check will depend on your age, overall health, lifestyle risk factors, and family history of disease. Counseling  Your health care provider may ask you questions about your: Alcohol use. Tobacco use. Drug use. Emotional well-being. Home and relationship well-being. Sexual activity. Eating habits. Work and work Astronomer. Screening  You may have the following tests or measurements: Height, weight, and BMI. Blood pressure. Lipid and cholesterol levels. These may be checked every 5 years, or more frequently if you are over 78 years old. Skin check. Lung cancer screening. You may have this screening every year starting at age 47 if you have a 30-pack-year history of smoking and currently smoke or have quit within the past 15 years. Fecal occult blood test (FOBT) of the stool. You may have this test every year starting at age 54. Flexible sigmoidoscopy or colonoscopy. You may have a sigmoidoscopy every 5 years or a colonoscopy every 10 years starting at age 23. Prostate cancer screening. Recommendations will vary depending on your family history and other risks. Hepatitis C blood test. Hepatitis B blood test. Sexually transmitted disease (STD) testing. Diabetes screening. This is done by checking your blood sugar (glucose) after you have not eaten for a while (fasting). You may have this done every 1-3 years. Discuss your test results, treatment  options, and if necessary, the need for more tests with your health care provider. Vaccines  Your health care provider may recommend certain vaccines, such as: Influenza vaccine. This is recommended every year. Tetanus, diphtheria, and acellular pertussis (Tdap, Td) vaccine. You may need a Td booster every 10 years. Zoster vaccine. You may need this after age  32. Pneumococcal 13-valent conjugate (PCV13) vaccine. You may need this if you have certain conditions and have not been vaccinated. Pneumococcal polysaccharide (PPSV23) vaccine. You may need one or two doses if you smoke cigarettes or if you have certain conditions. Talk to your health care provider about which screenings and vaccines you need and how often you need them. This information is not intended to replace advice given to you by your health care provider. Make sure you discuss any questions you have with your health care provider. Document Released: 08/08/2015 Document Revised: 03/31/2016 Document Reviewed: 05/13/2015 Elsevier Interactive Patient Education  2017 [employer].  Fall Prevention in the Home Falls can cause injuries. They can happen to people of all ages. There are many things you can do to make your home safe and to help prevent falls. What can I do on the outside of my home? Regularly fix the edges of walkways and driveways and fix any cracks. Remove anything that might make you trip as you walk through a door, such as a raised step or threshold. Trim any bushes or trees on the path to your home. Use bright outdoor lighting. Clear any walking paths of anything that might make someone trip, such as rocks or tools. Regularly check to see if handrails are loose or broken. Make sure that both sides of any steps have handrails. Any raised decks and porches should have guardrails on the edges. Have any leaves, snow, or ice cleared regularly. Use sand or salt on walking paths during winter. Clean up any spills in your garage right away. This includes oil or grease spills. What can I do in the bathroom? Use night lights. Install grab bars by the toilet and in the tub and shower. Do not use towel bars as grab bars. Use non-skid mats or decals in the tub or shower. If you need to sit down in the shower, use a plastic, non-slip stool. Keep the floor dry. Clean up any water  that spills on the floor as soon as it happens. Remove soap buildup in the tub or shower regularly. Attach bath mats securely with double-sided non-slip rug tape. Do not have throw rugs and other things on the floor that can make you trip. What can I do in the bedroom? Use night lights. Make sure that you have a light by your bed that is easy to reach. Do not use any sheets or blankets that are too big for your bed. They should not hang down onto the floor. Have a firm chair that has side arms. You can use this for support while you get dressed. Do not have throw rugs and other things on the floor that can make you trip. What can I do in the kitchen? Clean up any spills right away. Avoid walking on wet floors. Keep items that you use a lot in easy-to-reach places. If you need to reach something above you, use a strong step stool that has a grab bar. Keep electrical cords out of the way. Do not use floor polish or wax that makes floors slippery. If you must use wax, use non-skid floor wax. Do not have  throw rugs and other things on the floor that can make you trip. What can I do with my stairs? Do not leave any items on the stairs. Make sure that there are handrails on both sides of the stairs and use them. Fix handrails that are broken or loose. Make sure that handrails are as long as the stairways. Check any carpeting to make sure that it is firmly attached to the stairs. Fix any carpet that is loose or worn. Avoid having throw rugs at the top or bottom of the stairs. If you do have throw rugs, attach them to the floor with carpet tape. Make sure that you have a light switch at the top of the stairs and the bottom of the stairs. If you do not have them, ask someone to add them for you. What else can I do to help prevent falls? Wear shoes that: Do not have high heels. Have rubber bottoms. Are comfortable and fit you well. Are closed at the toe. Do not wear sandals. If you use a  stepladder: Make sure that it is fully opened. Do not climb a closed stepladder. Make sure that both sides of the stepladder are locked into place. Ask someone to hold it for you, if possible. Clearly mark and make sure that you can see: Any grab bars or handrails. First and last steps. Where the edge of each step is. Use tools that help you move around (mobility aids) if they are needed. These include: Canes. Walkers. Scooters. Crutches. Turn on the lights when you go into a dark area. Replace any light bulbs as soon as they burn out. Set up your furniture so you have a clear path. Avoid moving your furniture around. If any of your floors are uneven, fix them. If there are any pets around you, be aware of where they are. Review your medicines with your doctor. Some medicines can make you feel dizzy. This can increase your chance of falling. Ask your doctor what other things that you can do to help prevent falls. This information is not intended to replace advice given to you by your health care provider. Make sure you discuss any questions you have with your health care provider. Document Released: 05/08/2009 Document Revised: 12/18/2015 Document Reviewed: 08/16/2014 Elsevier Interactive Patient Education  2017 [employer].

## 2023-01-04 NOTE — Progress Notes (Signed)
Subjective:   Calvin [name] is a 62 y.o. male who presents for Medicare Annual/Subsequent preventive examination.  I connected with  Rory Frangos on 01/04/23 by a audio enabled telemedicine application and verified that I am speaking with the correct person using two identifiers.  Patient Location: Home  Provider Location: Home Office  I discussed the limitations of evaluation and management by telemedicine. The patient expressed understanding and agreed to proceed.  Review of Systems     Cardiac Risk Factors include: advanced age (>65men, >37 women);smoking/ tobacco exposure;male gender;hypertension     Objective:    Today's Vitals   01/04/23 1527  Weight: 171 lb (77.6 kg)  Height: 6\' 1"  (1.854 m)   Body mass index is 22.56 kg/m.     01/04/2023    3:56 PM 02/08/2022    3:07 PM 09/29/2021    3:10 PM 09/24/2021   10:18 AM 07/15/2020    3:48 PM 08/06/2019    6:47 PM 12/23/2018    5:19 PM  Advanced Directives  Does Patient Have a Medical Advance Directive? No No No No No No Yes  Type of Advance Directive       Healthcare Power of Attorney  Would patient like information on creating a medical advance directive? Yes (MAU/Ambulatory/Procedural Areas - Information given) No - Patient declined Yes (MAU/Ambulatory/Procedural Areas - Information given) No - Patient declined No - Patient declined No - Patient declined No - Patient declined    Current Medications (verified) Outpatient Encounter Medications as of 01/04/2023  Medication Sig   cyclobenzaprine (FLEXERIL) 5 MG tablet Take 1 tablet (5 mg total) by mouth 2 (two) times daily as needed for muscle spasms.   gabapentin (NEURONTIN) 300 MG capsule Take 1 capsule by mouth 3 (three) times daily.   melatonin 3 MG TABS tablet Take 1 tablet (3 mg total) by mouth at bedtime.   nicotine (NICODERM CQ - DOSED IN MG/24 HR) 7 mg/24hr patch Place 1 patch (7 mg total) onto the skin daily.   sildenafil (VIAGRA) 100 MG tablet Take 1 tablet by mouth 1/2  hr prior to intercourse as needed. Max 1 tab/24 hr   SUBLOCADE 300 MG/1.5ML SOSY Inject into the skin.   [DISCONTINUED] influenza vac split quadrivalent PF (FLUARIX) 0.5 ML injection Inject into the muscle.   No facility-administered encounter medications on file as of 01/04/2023.    Allergies (verified) Norvasc [amlodipine]   History: Past Medical History:  Diagnosis Date   Anxiety    Arthritis    back, shoulders    Constipation    Depression    Disc disorder    Hypertension    Lumbar herniated disc    Muscle spasm    Past Surgical History:  Procedure Laterality Date   ANTERIOR CERVICAL DECOMP/DISCECTOMY FUSION N/A 03/08/2018   Procedure: ANTERIOR CERVICAL DECOMPRESSION/DISCECTOMY FUSION , cervical 3-4, cervical 4-5, cervical 5-6 with intrumentational and allograft;  Surgeon: Estill Bamberg, MD;  Location: [facility] OR;  Service: Orthopedics;  Laterality: N/A;   BONE BIOPSY Right 05/17/2018   Procedure: BONE BIOPSY X2;  Surgeon: Park Liter, DPM;  Location: [facility];  Service: Podiatry;  Laterality: Right;  right second toe   COLONOSCOPY     FOOT SURGERY     right, ~ 2002   POLYPECTOMY     Family History  Problem Relation Age of Onset   Diabetes Mother    Hypertension Mother    Heart disease Mother    Hyperlipidemia Mother  Diabetes Brother    Diabetes Daughter    Hypertension Sister    Diabetes Sister    Stomach cancer Maternal Grandfather    Colon cancer Neg Hx    Esophageal cancer Neg Hx    Rectal cancer Neg Hx    Colon polyps Neg Hx    Social History   Socioeconomic History   Marital status: Single    Spouse name: Not on file   Number of children: Not on file   Years of education: Not on file   Highest education level: Not on file  Occupational History   Not on file  Tobacco Use   Smoking status: Every Day    Packs/day: 0.10    Years: 20.00    Additional pack years: 0.00    Total pack years: 2.00    Types: Cigarettes   Smokeless  tobacco: Never   Tobacco comments:    5 cigs a day   Vaping Use   Vaping Use: Never used  Substance and Sexual Activity   Alcohol use: No   Drug use: No   Sexual activity: Not Currently  Other Topics Concern   Not on file  Social History Narrative   Not on file   Social Determinants of Health   Financial Resource Strain: Low Risk  (01/04/2023)   Overall Financial Resource Strain (CARDIA)    Difficulty of Paying Living Expenses: Not hard at all  Food Insecurity: No Food Insecurity (01/04/2023)   Hunger Vital Sign    Worried About Running Out of Food in the Last Year: Never true    Ran Out of Food in the Last Year: Never true  Transportation Needs: No Transportation Needs (01/04/2023)   PRAPARE - Administrator, Civil Service (Medical): No    Lack of Transportation (Non-Medical): No  Physical Activity: Inactive (01/04/2023)   Exercise Vital Sign    Days of Exercise per Week: 0 days    Minutes of Exercise per Session: 0 min  Stress: No Stress Concern Present (01/04/2023)   [employer] of Occupational Health - Occupational Stress Questionnaire    Feeling of Stress : Not at all  Social Connections: Socially Isolated (01/04/2023)   Social Connection and Isolation Panel [NHANES]    Frequency of Communication with Friends and Family: More than three times a week    Frequency of Social Gatherings with Friends and Family: Three times a week    Attends Religious Services: Never    Active Member of Clubs or Organizations: No    Attends Engineer, structural: Never    Marital Status: Never married    Tobacco Counseling Ready to quit: Not Answered Counseling given: Not Answered Tobacco comments: 5 cigs a day    Clinical Intake:  Pre-visit preparation completed: Yes  Pain : No/denies pain  Diabetes: No  How often do you need to have someone help you when you read instructions, pamphlets, or other written materials from your doctor or pharmacy?: 1 -  Never  Diabetic?No   Interpreter Needed?: No  Information entered by :: Kandis Fantasia LPN   Activities of Daily Living    01/04/2023    3:57 PM  In your present state of health, do you have any difficulty performing the following activities:  Hearing? 0  Vision? 0  Difficulty concentrating or making decisions? 0  Walking or climbing stairs? 1  Dressing or bathing? 0  Doing errands, shopping? 0  Preparing Food and eating ? N  Using the  Toilet? N  In the past six months, have you accidently leaked urine? N  Do you have problems with loss of bowel control? N  Managing your Medications? N  Managing your Finances? N  Housekeeping or managing your Housekeeping? N    Patient Care Team: Marcine Matar, MD as PCP - General (Internal Medicine) Janene Madeira, MD as Referring Physician (Orthopedic Surgery)  Indicate any recent Medical Services you may have received from other than [facility] providers in the past year (date may be approximate).     Assessment:   This is a routine wellness examination for Calvin [name].  Hearing/Vision screen Hearing Screening - Comments:: Denies hearing difficulties   Vision Screening - Comments:: No vision problems; will schedule routine eye exam soon    Dietary issues and exercise activities discussed: Current Exercise Habits: The patient does not participate in regular exercise at present   Goals Addressed             This Visit's Progress    Recover from hip surgery and increase mobility         Depression Screen    01/04/2023    3:55 PM 09/24/2021   10:17 AM 08/03/2021    1:46 PM 11/06/2020    2:31 PM 03/27/2020    3:48 PM 09/05/2019    1:51 PM 08/15/2018    3:48 PM  PHQ 2/9 Scores  PHQ - 2 Score 0 4 4 4 2  0 4  PHQ- 9 Score  12 13 17 13  0 13    Fall Risk    01/04/2023    3:29 PM 09/24/2021   10:18 AM 08/03/2021    1:46 PM 11/06/2020    2:31 PM 09/05/2019    1:50 PM  Fall Risk   Falls in the past year? 0 0 0 0 1  Number falls in  past yr: 0 0 0 0 0  Injury with Fall? 0 0 0 0 1  Risk for fall due to : No Fall Risks No Fall Risks No Fall Risks    Follow up Falls prevention discussed;Education provided;Falls evaluation completed        FALL RISK PREVENTION PERTAINING TO THE HOME:  Any stairs in or around the home? Yes  If so, are there any without handrails? No  Home free of loose throw rugs in walkways, pet beds, electrical cords, etc? Yes  Adequate lighting in your home to reduce risk of falls? Yes   ASSISTIVE DEVICES UTILIZED TO PREVENT FALLS:  Life alert? No  Use of a cane, walker or w/c? Yes  Grab bars in the bathroom? Yes  Shower chair or bench in shower? No  Elevated toilet seat or a handicapped toilet? Yes   TIMED UP AND GO:  Was the test performed? No . Telephonic visit   Cognitive Function:    09/24/2021   10:15 AM  MMSE - Mini Mental State Exam  Orientation to time 5  Orientation to Place 5  Registration 3  Attention/ Calculation 5  Recall 3  Language- name 2 objects 2  Language- repeat 1  Language- follow 3 step command 3  Language- read & follow direction 1  Write a sentence 1  Copy design 1  Total score 30        01/04/2023    3:57 PM  6CIT Screen  What Year? 0 points  What month? 0 points  What time? 0 points  Count back from 20 0 points  Months in reverse 0  points  Repeat phrase 0 points  Total Score 0 points    Immunizations Immunization History  Administered Date(s) Administered   Influenza,inj,Quad PF,6+ Mos 06/04/2014, 05/28/2015, 05/13/2016, 05/15/2018, 03/27/2020, 04/28/2021, 05/05/2022   Janssen (J&J) SARS-COV-2 Vaccination 12/28/2019   PFIZER(Purple Top)SARS-COV-2 Vaccination 09/01/2020   Pfizer Covid-19 Vaccine Bivalent Booster 34yrs & up 04/18/2021   Pneumococcal Conjugate (Pcv15) 08/03/2021   Pneumococcal Polysaccharide-23 06/04/2014   Tdap 06/26/2015   Zoster Recombinat (Shingrix) 08/15/2021, 12/08/2021    TDAP status: Up to date  Pneumococcal  vaccine status: Up to date  Covid-19 vaccine status: Information provided on how to obtain vaccines.   Qualifies for Shingles Vaccine? Yes   Zostavax completed No   Shingrix Completed?: No.    Education has been provided regarding the importance of this vaccine. Patient has been advised to call insurance company to determine out of pocket expense if they have not yet received this vaccine. Advised may also receive vaccine at local pharmacy or Health Dept. Verbalized acceptance and understanding.  Screening Tests Health Maintenance  Topic Date Due   COVID-19 Vaccine (4 - 2023-24 season) 03/26/2022   INFLUENZA VACCINE  02/24/2023   Medicare Annual Wellness (AWV)  01/04/2024   DTaP/Tdap/Td (2 - Td or Tdap) 06/25/2025   Colonoscopy  02/26/2027   Hepatitis C Screening  Completed   HIV Screening  Completed   Zoster Vaccines- Shingrix  Completed   HPV VACCINES  Aged Out    Health Maintenance  Health Maintenance Due  Topic Date Due   COVID-19 Vaccine (4 - 2023-24 season) 03/26/2022    Colorectal cancer screening: Type of screening: Colonoscopy. Completed 02/25/17. Repeat every 10 years  Lung Cancer Screening: (Low Dose CT Chest recommended if Age 39-80 years, 30 pack-year currently smoking OR have quit w/in 15years.) does not qualify.   Lung Cancer Screening Referral: n/a  Additional Screening:  Hepatitis C Screening: does qualify; Completed 09/05/21  Vision Screening: Recommended annual ophthalmology exams for early detection of glaucoma and other disorders of the eye. Is the patient up to date with their annual eye exam?  No  Who is the provider or what is the name of the office in which the patient attends annual eye exams? none If pt is not established with a provider, would they like to be referred to a provider to establish care? No .   Dental Screening: Recommended annual dental exams for proper oral hygiene  Community Resource Referral / Chronic Care Management: CRR  required this visit?  No   CCM required this visit?  No      Plan:     I have personally reviewed and noted the following in the patient's chart:   Medical and social history Use of alcohol, tobacco or illicit drugs  Current medications and supplements including opioid prescriptions. Patient is not currently taking opioid prescriptions. Functional ability and status Nutritional status Physical activity Advanced directives List of other physicians Hospitalizations, surgeries, and ER visits in previous 12 months Vitals Screenings to include cognitive, depression, and falls Referrals and appointments  In addition, I have reviewed and discussed with patient certain preventive protocols, quality metrics, and best practice recommendations. A written personalized care plan for preventive services as well as general preventive health recommendations were provided to patient.     Durwin Nora, [place]   11/01/8117   Due to this being a virtual visit, the after visit summary with patients personalized plan was offered to patient via mail or my-chart.  Patient would like to  access on my-chart  Nurse Notes: No concerns

## 2023-01-13 DIAGNOSIS — Z888 Allergy status to other drugs, medicaments and biological substances status: Secondary | ICD-10-CM | POA: Diagnosis not present

## 2023-01-13 DIAGNOSIS — Z87891 Personal history of nicotine dependence: Secondary | ICD-10-CM | POA: Diagnosis not present

## 2023-01-13 DIAGNOSIS — M160 Bilateral primary osteoarthritis of hip: Secondary | ICD-10-CM | POA: Diagnosis not present

## 2023-01-13 DIAGNOSIS — N183 Chronic kidney disease, stage 3 unspecified: Secondary | ICD-10-CM | POA: Diagnosis not present

## 2023-01-13 DIAGNOSIS — M1612 Unilateral primary osteoarthritis, left hip: Secondary | ICD-10-CM | POA: Diagnosis not present

## 2023-01-13 DIAGNOSIS — G8918 Other acute postprocedural pain: Secondary | ICD-10-CM | POA: Diagnosis not present

## 2023-01-13 DIAGNOSIS — F1121 Opioid dependence, in remission: Secondary | ICD-10-CM | POA: Diagnosis not present

## 2023-01-13 DIAGNOSIS — Z96642 Presence of left artificial hip joint: Secondary | ICD-10-CM | POA: Diagnosis not present

## 2023-01-13 DIAGNOSIS — F1910 Other psychoactive substance abuse, uncomplicated: Secondary | ICD-10-CM | POA: Diagnosis not present

## 2023-01-13 DIAGNOSIS — I129 Hypertensive chronic kidney disease with stage 1 through stage 4 chronic kidney disease, or unspecified chronic kidney disease: Secondary | ICD-10-CM | POA: Diagnosis not present

## 2023-01-13 DIAGNOSIS — I10 Essential (primary) hypertension: Secondary | ICD-10-CM | POA: Diagnosis not present

## 2023-02-15 ENCOUNTER — Ambulatory Visit: Payer: Medicare HMO | Admitting: Internal Medicine

## 2023-03-04 DIAGNOSIS — M25552 Pain in left hip: Secondary | ICD-10-CM | POA: Diagnosis not present

## 2023-03-04 DIAGNOSIS — M1612 Unilateral primary osteoarthritis, left hip: Secondary | ICD-10-CM | POA: Diagnosis not present

## 2023-03-04 DIAGNOSIS — R6889 Other general symptoms and signs: Secondary | ICD-10-CM | POA: Diagnosis not present

## 2023-03-04 DIAGNOSIS — Z96642 Presence of left artificial hip joint: Secondary | ICD-10-CM | POA: Diagnosis not present

## 2023-03-17 DIAGNOSIS — R6889 Other general symptoms and signs: Secondary | ICD-10-CM | POA: Diagnosis not present

## 2023-03-24 ENCOUNTER — Ambulatory Visit: Payer: Medicare HMO | Admitting: Physical Therapy

## 2023-04-05 DIAGNOSIS — L258 Unspecified contact dermatitis due to other agents: Secondary | ICD-10-CM | POA: Diagnosis not present

## 2023-04-07 ENCOUNTER — Ambulatory Visit: Payer: Medicare HMO

## 2023-04-20 DIAGNOSIS — L258 Unspecified contact dermatitis due to other agents: Secondary | ICD-10-CM | POA: Diagnosis not present

## 2023-04-21 ENCOUNTER — Ambulatory Visit: Payer: Medicare HMO

## 2023-05-02 ENCOUNTER — Other Ambulatory Visit: Payer: Self-pay

## 2023-05-02 ENCOUNTER — Ambulatory Visit: Payer: Medicare HMO | Attending: Physician Assistant

## 2023-05-02 DIAGNOSIS — R262 Difficulty in walking, not elsewhere classified: Secondary | ICD-10-CM | POA: Diagnosis present

## 2023-05-02 DIAGNOSIS — M6281 Muscle weakness (generalized): Secondary | ICD-10-CM | POA: Diagnosis present

## 2023-05-02 DIAGNOSIS — M25552 Pain in left hip: Secondary | ICD-10-CM | POA: Diagnosis present

## 2023-05-12 ENCOUNTER — Ambulatory Visit: Payer: Medicare HMO | Admitting: Internal Medicine

## 2023-05-22 NOTE — Therapy (Deleted)
OUTPATIENT PHYSICAL THERAPY LOWER EXTREMITY EVALUATION   Patient Name: Calvin [name] MRN: [id number] DOB:20-Jul-1961, 62 y.o., male Today's Date: 05/22/2023  END OF SESSION:    Past Medical History:  Diagnosis Date   Anxiety    Arthritis    back, shoulders    Constipation    Depression    Disc disorder    Hypertension    Lumbar herniated disc    Muscle spasm    Past Surgical History:  Procedure Laterality Date   ANTERIOR CERVICAL DECOMP/DISCECTOMY FUSION N/A 03/08/2018   Procedure: ANTERIOR CERVICAL DECOMPRESSION/DISCECTOMY FUSION , cervical 3-4, cervical 4-5, cervical 5-6 with intrumentational and allograft;  Surgeon: Estill Bamberg, MD;  Location: [facility] OR;  Service: Orthopedics;  Laterality: N/A;   BONE BIOPSY Right 05/17/2018   Procedure: BONE BIOPSY X2;  Surgeon: Park Liter, DPM;  Location: [facility];  Service: Podiatry;  Laterality: Right;  right second toe   COLONOSCOPY     FOOT SURGERY     right, ~ 2002   POLYPECTOMY     Patient Active Problem List   Diagnosis Date Noted   Hepatitis C virus infection cured after antiviral drug therapy 09/24/2021   Chronic hepatitis C (HCC) 09/17/2021   Hyperlipidemia 08/05/2021   CKD (chronic kidney disease) stage 3, GFR 30-59 ml/min (HCC) 08/05/2021   Insomnia 08/03/2021   Tobacco dependence 08/03/2021   Opioid use disorder in remission 08/03/2021   Severe sepsis (HCC) 08/07/2019   Lobar pneumonia (HCC) 08/07/2019   Diarrhea 08/07/2019   Anemia 08/07/2019   Foot laceration, right, initial encounter    Radiculopathy 03/08/2018   Tubular adenoma 05/16/2017   Depressed mood 05/16/2017   Substance abuse (HCC) 05/16/2017   Hallux valgus 08/03/2016   Disc disorder    Lumbar facet arthropathy 08/06/2014   Lumbar radiculopathy 08/06/2014   ED (erectile dysfunction) 01/07/2014   Vitamin D deficiency 01/07/2014   Left shoulder pain 08/10/2013   Chronic low back pain 08/10/2013   Essential hypertension  08/10/2013   Tobacco abuse 08/10/2013    PCP: Marcine Matar, MD   REFERRING PROVIDER: Lubertha Basque, PA-C  REFERRING DIAG: [id number] (ICD-10-CM) - Unilateral primary osteoarthritis, left hip  THERAPY DIAG:  No diagnosis found.  Rationale for Evaluation and Treatment: Rehabilitation  ONSET DATE: 01/13/23 DOS  SUBJECTIVE:   SUBJECTIVE STATEMENT: Returns to PT s/p L THA on 01/13/23.  Reports no pain just weakness and stiffness.  Has returned to ambulating with a cane and stair climbing w/o aggravation of symptoms.  Initial R hip pain has improved markedly since L hip surgery  PERTINENT HISTORY:   S/p THA. WBAT, walking, balance, hip/knee motion, core/leg strengthening.   PAIN:  Are you having pain? Yes: NPRS scale: 0/10 Pain location: L hip Pain description: n/a Aggravating factors: n/a Relieving factors: n/a  PRECAUTIONS: Anterior hip  RED FLAGS: None   WEIGHT BEARING RESTRICTIONS:  WBAT  FALLS:  Has patient fallen in last 6 months? No   OCCUPATION: not working  PLOF: Independent  PATIENT GOALS: To manage my hip symptoms  NEXT MD VISIT: 6 mo  OBJECTIVE:  Note: Objective measures were completed at Evaluation unless otherwise noted.  DIAGNOSTIC FINDINGS: 03/04/2023 2:21 PM EDT  XR LEFT HIP 3 VIEWS WITH PELVIS  CLINICAL INFORMATION: M25.552 Pain in left hip.  COMPARISON: 01/13/2023  FINDINGS/IMPRESSION: Left total hip arthroplasty. Orthopedic hardware is intact. No periprosthetic lucency or fracture. Expected resolution of postsurgical soft tissue swelling and soft tissue air.  Mild-to-moderate osteoarthritis of the  included right hip. Pubic symphysis and sacroiliac joints are intact. 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  Electronically Signed by:  Adah Perl, MD, [facility] Electronically Signed on:  03/04/2023 2:21 PM   PATIENT SURVEYS:  FOTO 53(66 predicted)  MUSCLE LENGTH: Hamstrings:  Left 65 deg Thomas test:  deferred  POSTURE: not tested  PALPATION: unremarkable  LOWER EXTREMITY ROM:  Passive ROM Right eval Left eval  Hip flexion  100d  Hip extension  -10d  Hip abduction  30d  Hip adduction    Hip internal rotation    Hip external rotation    Knee flexion    Knee extension    Ankle dorsiflexion    Ankle plantarflexion    Ankle inversion    Ankle eversion     (Blank rows = not tested)  LOWER EXTREMITY MMT:  MMT Right eval Left eval  Hip flexion  4  Hip extension  3  Hip abduction  3  Hip adduction    Hip internal rotation    Hip external rotation    Knee flexion    Knee extension    Ankle dorsiflexion    Ankle plantarflexion    Ankle inversion    Ankle eversion     (Blank rows = not tested)  FUNCTIONAL TESTS:  5 times sit to stand: 10s high table arms crossed  GAIT: Distance walked: 44ftx2 Assistive device utilized: Single point cane Level of assistance: Modified independence Comments: decreased L step length and knee flexion in swing phase, vaulting on LLE   TODAY'S TREATMENT:                                                                                                                              DATE: 05/02/23 Eval and HEP   PATIENT EDUCATION:  Education details: Discussed eval findings, rehab rationale and POC and patient is in agreement  Person educated: Patient Education method: Explanation Education comprehension: verbalized understanding and needs further education  HOME EXERCISE PROGRAM: Bridge Hip fallouts Hip flexor stretch Seated hamstring stretch  ASSESSMENT:  CLINICAL IMPRESSION: Patient is a 62 y.o. male who was seen today for physical therapy evaluation and treatment for L hip pain, stiffness and weakness following THA. He denies pain but presents with marked ROM and strength deficits.  He uses a cane for mobility.  5x STS time is functional from high table. Patient is a good OPPT candidate to focus on regaining hip ROM, strength  and function.  OBJECTIVE IMPAIRMENTS: Abnormal gait, decreased activity tolerance, decreased endurance, decreased mobility, difficulty walking, decreased ROM, decreased strength, impaired perceived functional ability, and pain.   ACTIVITY LIMITATIONS: carrying, lifting, sitting, standing, squatting, and stairs  PERSONAL FACTORS: Age, Fitness, Time since onset of injury/illness/exacerbation, and 1 comorbidity: CKD and chronic back pain  are also affecting patient's functional outcome.   REHAB POTENTIAL: Good  CLINICAL DECISION MAKING: Stable/uncomplicated  EVALUATION COMPLEXITY: Low   GOALS: Goals reviewed with patient? No  SHORT TERM GOALS: Target date: 05/23/2023   Patient to demonstrate independence in HEP  Baseline: TBD Goal status: INITIAL  2.  253ft ambulation with LRAD Baseline: 34ft with cane Goal status: INITIAL   LONG TERM GOALS: Target date: 06/13/2023    Patient will score at least 66% on FOTO to signify clinically meaningful improvement in functional abilities.   Baseline: 54 Goal status: INITIAL  2.  572ft ambulation with LRAD Baseline: 90ft with SPC Goal status: INITIAL  3.  Patient to negotiate 16 steps with most appropriate pattern and UE assist Baseline: TBD Goal status: INITIAL  4.  Increase L hip strength to 4/5 in deficit regions Baseline:  MMT Right eval Left eval  Hip flexion  4  Hip extension  3  Hip abduction  3   Goal status: INITIAL  5.  Increase L hip extension to 5d Baseline:  Passive ROM Right eval Left eval  Hip flexion  100d  Hip extension  -10d  Hip abduction  30d   Goal status: INITIAL    PLAN:  PT FREQUENCY: 1-2x/week  PT DURATION: 6 weeks  PLANNED INTERVENTIONS: Therapeutic exercises, Therapeutic activity, Neuromuscular re-education, Balance training, Gait training, Patient/Family education, Self Care, Joint mobilization, Stair training, Dry Needling, Spinal mobilization, Cryotherapy, Moist heat, Manual  therapy, and Re-evaluation  PLAN FOR NEXT SESSION: HEP review and update, manual techniques as appropriate, aerobic tasks, ROM and flexibility activities, strengthening and PREs, TPDN, gait and balance training as needed     Hildred Laser, PT 05/22/2023, 3:13 PM   Referring diagnosis? L THA Treatment diagnosis? (if different than referring diagnosis) L THA What was this (referring dx) caused by? [x]  Surgery []  Fall []  Ongoing issue []  Arthritis []  Other: ____________  Laterality: []  Rt [x]  Lt []  Both  Check all possible CPT codes:  *CHOOSE 10 OR LESS*    []  97110 (Therapeutic Exercise)  []  92507 (SLP Treatment)  []  97112 (Neuro Re-ed)   []  92526 (Swallowing Treatment)   []  97116 (Gait Training)   []  [id number] (Cognitive Training, 1st 15 minutes) []  97140 (Manual Therapy)   []  97130 (Cognitive Training, each add'l 15 minutes)  []  97164 (Re-evaluation)                              []  Other, List CPT Code ____________  []  97530 (Therapeutic Activities)     []  97535 (Self Care)   [x]  All codes above (97110 - 97535)  []  97012 (Mechanical Traction)  []  78295 (E-stim Unattended)  []  97032 (E-stim manual)  []  97033 (Ionto)  []  97035 (Ultrasound) []  97750 (Physical Performance Training) []  [id number] (Aquatic Therapy) []  97016 (Vasopneumatic Device) []  [id number] (Paraffin) []  97034 (Contrast Bath) []  97597 (Wound Care 1st 20 sq cm) []  97598 (Wound Care each add'l 20 sq cm) []  97760 (Orthotic Fabrication, Fitting, Training Initial) []  [id number] (Prosthetic Management and Training Initial) []  [id number] (Orthotic or Prosthetic Training/ Modification Subsequent)

## 2023-05-23 ENCOUNTER — Telehealth: Payer: Self-pay

## 2023-05-23 ENCOUNTER — Ambulatory Visit: Payer: Medicare HMO

## 2023-05-23 NOTE — Telephone Encounter (Signed)
TC due to missed visit.  VM left with next appointment date and time given as well as reminder of attendance policy.

## 2023-05-25 ENCOUNTER — Telehealth: Payer: Self-pay

## 2023-05-25 ENCOUNTER — Ambulatory Visit: Payer: Medicare HMO

## 2023-05-25 NOTE — Telephone Encounter (Signed)
TC due to missed visit.  VM left informing patient of missed visit as well as reminder of next scheduled appointment.  Informed of attendance policy and potential DC after 3 no shows

## 2023-05-25 NOTE — Therapy (Deleted)
OUTPATIENT PHYSICAL THERAPY LOWER EXTREMITY EVALUATION   Patient Name: Calvin [name] MRN: [id number] DOB:May 14, 1961, 62 y.o., male Today's Date: 05/25/2023  END OF SESSION:    Past Medical History:  Diagnosis Date   Anxiety    Arthritis    back, shoulders    Constipation    Depression    Disc disorder    Hypertension    Lumbar herniated disc    Muscle spasm    Past Surgical History:  Procedure Laterality Date   ANTERIOR CERVICAL DECOMP/DISCECTOMY FUSION N/A 03/08/2018   Procedure: ANTERIOR CERVICAL DECOMPRESSION/DISCECTOMY FUSION , cervical 3-4, cervical 4-5, cervical 5-6 with intrumentational and allograft;  Surgeon: Estill Bamberg, MD;  Location: [facility] OR;  Service: Orthopedics;  Laterality: N/A;   BONE BIOPSY Right 05/17/2018   Procedure: BONE BIOPSY X2;  Surgeon: Park Liter, DPM;  Location: [facility];  Service: Podiatry;  Laterality: Right;  right second toe   COLONOSCOPY     FOOT SURGERY     right, ~ 2002   POLYPECTOMY     Patient Active Problem List   Diagnosis Date Noted   Hepatitis C virus infection cured after antiviral drug therapy 09/24/2021   Chronic hepatitis C (HCC) 09/17/2021   Hyperlipidemia 08/05/2021   CKD (chronic kidney disease) stage 3, GFR 30-59 ml/min (HCC) 08/05/2021   Insomnia 08/03/2021   Tobacco dependence 08/03/2021   Opioid use disorder in remission 08/03/2021   Severe sepsis (HCC) 08/07/2019   Lobar pneumonia (HCC) 08/07/2019   Diarrhea 08/07/2019   Anemia 08/07/2019   Foot laceration, right, initial encounter    Radiculopathy 03/08/2018   Tubular adenoma 05/16/2017   Depressed mood 05/16/2017   Substance abuse (HCC) 05/16/2017   Hallux valgus 08/03/2016   Disc disorder    Lumbar facet arthropathy 08/06/2014   Lumbar radiculopathy 08/06/2014   ED (erectile dysfunction) 01/07/2014   Vitamin D deficiency 01/07/2014   Left shoulder pain 08/10/2013   Chronic low back pain 08/10/2013   Essential hypertension  08/10/2013   Tobacco abuse 08/10/2013    PCP: Marcine Matar, MD   REFERRING PROVIDER: Lubertha Basque, PA-C  REFERRING DIAG: [phone] (ICD-10-CM) - Unilateral primary osteoarthritis, left hip  THERAPY DIAG:  No diagnosis found.  Rationale for Evaluation and Treatment: Rehabilitation  ONSET DATE: 01/13/23 DOS  SUBJECTIVE:   SUBJECTIVE STATEMENT: Returns to PT s/p L THA on 01/13/23.  Reports no pain just weakness and stiffness.  Has returned to ambulating with a cane and stair climbing w/o aggravation of symptoms.  Initial R hip pain has improved markedly since L hip surgery  PERTINENT HISTORY:   S/p THA. WBAT, walking, balance, hip/knee motion, core/leg strengthening.   PAIN:  Are you having pain? Yes: NPRS scale: 0/10 Pain location: L hip Pain description: n/a Aggravating factors: n/a Relieving factors: n/a  PRECAUTIONS: Anterior hip  RED FLAGS: None   WEIGHT BEARING RESTRICTIONS:  WBAT  FALLS:  Has patient fallen in last 6 months? No   OCCUPATION: not working  PLOF: Independent  PATIENT GOALS: To manage my hip symptoms  NEXT MD VISIT: 6 mo  OBJECTIVE:  Note: Objective measures were completed at Evaluation unless otherwise noted.  DIAGNOSTIC FINDINGS: 03/04/2023 2:21 PM EDT  XR LEFT HIP 3 VIEWS WITH PELVIS  CLINICAL INFORMATION: M25.552 Pain in left hip.  COMPARISON: 01/13/2023  FINDINGS/IMPRESSION: Left total hip arthroplasty. Orthopedic hardware is intact. No periprosthetic lucency or fracture. Expected resolution of postsurgical soft tissue swelling and soft tissue air.  Mild-to-moderate osteoarthritis of the  included right hip. Pubic symphysis and sacroiliac joints are intact. 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  Electronically Signed by:  Adah Perl, MD, [facility] Electronically Signed on:  03/04/2023 2:21 PM   PATIENT SURVEYS:  FOTO 53(66 predicted)  MUSCLE LENGTH: Hamstrings:  Left 65 deg Thomas test:  deferred  POSTURE: not tested  PALPATION: unremarkable  LOWER EXTREMITY ROM:  Passive ROM Right eval Left eval  Hip flexion  100d  Hip extension  -10d  Hip abduction  30d  Hip adduction    Hip internal rotation    Hip external rotation    Knee flexion    Knee extension    Ankle dorsiflexion    Ankle plantarflexion    Ankle inversion    Ankle eversion     (Blank rows = not tested)  LOWER EXTREMITY MMT:  MMT Right eval Left eval  Hip flexion  4  Hip extension  3  Hip abduction  3  Hip adduction    Hip internal rotation    Hip external rotation    Knee flexion    Knee extension    Ankle dorsiflexion    Ankle plantarflexion    Ankle inversion    Ankle eversion     (Blank rows = not tested)  FUNCTIONAL TESTS:  5 times sit to stand: 10s high table arms crossed  GAIT: Distance walked: 58ftx2 Assistive device utilized: Single point cane Level of assistance: Modified independence Comments: decreased L step length and knee flexion in swing phase, vaulting on LLE   TODAY'S TREATMENT:                                                                                                                              DATE: 05/02/23 Eval and HEP   PATIENT EDUCATION:  Education details: Discussed eval findings, rehab rationale and POC and patient is in agreement  Person educated: Patient Education method: Explanation Education comprehension: verbalized understanding and needs further education  HOME EXERCISE PROGRAM: Bridge Hip fallouts Hip flexor stretch Seated hamstring stretch  ASSESSMENT:  CLINICAL IMPRESSION: Patient is a 62 y.o. male who was seen today for physical therapy evaluation and treatment for L hip pain, stiffness and weakness following THA. He denies pain but presents with marked ROM and strength deficits.  He uses a cane for mobility.  5x STS time is functional from high table. Patient is a good OPPT candidate to focus on regaining hip ROM, strength  and function.  OBJECTIVE IMPAIRMENTS: Abnormal gait, decreased activity tolerance, decreased endurance, decreased mobility, difficulty walking, decreased ROM, decreased strength, impaired perceived functional ability, and pain.   ACTIVITY LIMITATIONS: carrying, lifting, sitting, standing, squatting, and stairs  PERSONAL FACTORS: Age, Fitness, Time since onset of injury/illness/exacerbation, and 1 comorbidity: CKD and chronic back pain  are also affecting patient's functional outcome.   REHAB POTENTIAL: Good  CLINICAL DECISION MAKING: Stable/uncomplicated  EVALUATION COMPLEXITY: Low   GOALS: Goals reviewed with patient? No  SHORT TERM GOALS: Target date: 05/23/2023   Patient to demonstrate independence in HEP  Baseline: TBD Goal status: INITIAL  2.  271ft ambulation with LRAD Baseline: 75ft with cane Goal status: INITIAL   LONG TERM GOALS: Target date: 06/13/2023    Patient will score at least 66% on FOTO to signify clinically meaningful improvement in functional abilities.   Baseline: 54 Goal status: INITIAL  2.  565ft ambulation with LRAD Baseline: 59ft with SPC Goal status: INITIAL  3.  Patient to negotiate 16 steps with most appropriate pattern and UE assist Baseline: TBD Goal status: INITIAL  4.  Increase L hip strength to 4/5 in deficit regions Baseline:  MMT Right eval Left eval  Hip flexion  4  Hip extension  3  Hip abduction  3   Goal status: INITIAL  5.  Increase L hip extension to 5d Baseline:  Passive ROM Right eval Left eval  Hip flexion  100d  Hip extension  -10d  Hip abduction  30d   Goal status: INITIAL    PLAN:  PT FREQUENCY: 1-2x/week  PT DURATION: 6 weeks  PLANNED INTERVENTIONS: Therapeutic exercises, Therapeutic activity, Neuromuscular re-education, Balance training, Gait training, Patient/Family education, Self Care, Joint mobilization, Stair training, Dry Needling, Spinal mobilization, Cryotherapy, Moist heat, Manual  therapy, and Re-evaluation  PLAN FOR NEXT SESSION: HEP review and update, manual techniques as appropriate, aerobic tasks, ROM and flexibility activities, strengthening and PREs, TPDN, gait and balance training as needed     Hildred Laser, PT 05/25/2023, 9:22 AM   Referring diagnosis? L THA Treatment diagnosis? (if different than referring diagnosis) L THA What was this (referring dx) caused by? [x]  Surgery []  Fall []  Ongoing issue []  Arthritis []  Other: ____________  Laterality: []  Rt [x]  Lt []  Both  Check all possible CPT codes:  *CHOOSE 10 OR LESS*    []  97110 (Therapeutic Exercise)  []  92507 (SLP Treatment)  []  16109 (Neuro Re-ed)   []  92526 (Swallowing Treatment)   []  97116 (Gait Training)   []  [id number] (Cognitive Training, 1st 15 minutes) []  97140 (Manual Therapy)   []  97130 (Cognitive Training, each add'l 15 minutes)  []  97164 (Re-evaluation)                              []  Other, List CPT Code ____________  []  97530 (Therapeutic Activities)     []  97535 (Self Care)   [x]  All codes above (97110 - 97535)  []  97012 (Mechanical Traction)  []  97014 (E-stim Unattended)  []  97032 (E-stim manual)  []  97033 (Ionto)  []  97035 (Ultrasound) []  97750 (Physical Performance Training) []  [id number] (Aquatic Therapy) []  97016 (Vasopneumatic Device) []  [id number] (Paraffin) []  97034 (Contrast Bath) []  97597 (Wound Care 1st 20 sq cm) []  97598 (Wound Care each add'l 20 sq cm) []  97760 (Orthotic Fabrication, Fitting, Training Initial) []  [id number] (Prosthetic Management and Training Initial) []  [id number] (Orthotic or Prosthetic Training/ Modification Subsequent)

## 2023-05-26 NOTE — Therapy (Deleted)
OUTPATIENT PHYSICAL THERAPY LOWER EXTREMITY EVALUATION   Patient Name: Calvin [name] MRN: [id number] DOB:[birth year], 62 y.o., male Today's Date: 05/26/2023  END OF SESSION:    Past Medical History:  Diagnosis Date   Anxiety    Arthritis    back, shoulders    Constipation    Depression    Disc disorder    Hypertension    Lumbar herniated disc    Muscle spasm    Past Surgical History:  Procedure Laterality Date   ANTERIOR CERVICAL DECOMP/DISCECTOMY FUSION N/A 03/08/2018   Procedure: ANTERIOR CERVICAL DECOMPRESSION/DISCECTOMY FUSION , cervical 3-4, cervical 4-5, cervical 5-6 with intrumentational and allograft;  Surgeon: Estill Bamberg, MD;  Location: [facility];  Service: Orthopedics;  Laterality: N/A;   BONE BIOPSY Right 05/17/2018   Procedure: BONE BIOPSY X2;  Surgeon: Park Liter, DPM;  Location: [facility];  Service: Podiatry;  Laterality: Right;  right second toe   COLONOSCOPY     FOOT SURGERY     right, ~ 2002   POLYPECTOMY     Patient Active Problem List   Diagnosis Date Noted   Hepatitis C virus infection cured after antiviral drug therapy 09/24/2021   Chronic hepatitis C (HCC) 09/17/2021   Hyperlipidemia 08/05/2021   CKD (chronic kidney disease) stage 3, GFR 30-59 ml/min (HCC) 08/05/2021   Insomnia 08/03/2021   Tobacco dependence 08/03/2021   Opioid use disorder in remission 08/03/2021   Severe sepsis (HCC) 08/07/2019   Lobar pneumonia (HCC) 08/07/2019   Diarrhea 08/07/2019   Anemia 08/07/2019   Foot laceration, right, initial encounter    Radiculopathy 03/08/2018   Tubular adenoma 05/16/2017   Depressed mood 05/16/2017   Substance abuse (HCC) 05/16/2017   Hallux valgus 08/03/2016   Disc disorder    Lumbar facet arthropathy 08/06/2014   Lumbar radiculopathy 08/06/2014   ED (erectile dysfunction) 01/07/2014   Vitamin D deficiency 01/07/2014   Left shoulder pain 08/10/2013   Chronic low back pain 08/10/2013   Essential hypertension  08/10/2013   Tobacco abuse 08/10/2013    PCP: Marcine Matar, MD   REFERRING PROVIDER: Lubertha Basque, PA-C  REFERRING DIAG: [phone] (ICD-10-CM) - Unilateral primary osteoarthritis, left hip  THERAPY DIAG:  No diagnosis found.  Rationale for Evaluation and Treatment: Rehabilitation  ONSET DATE: 01/13/23 DOS  SUBJECTIVE:   SUBJECTIVE STATEMENT: Returns to PT s/p L THA on 01/13/23.  Reports no pain just weakness and stiffness.  Has returned to ambulating with a cane and stair climbing w/o aggravation of symptoms.  Initial R hip pain has improved markedly since L hip surgery  PERTINENT HISTORY:   S/p THA. WBAT, walking, balance, hip/knee motion, core/leg strengthening.   PAIN:  Are you having pain? Yes: NPRS scale: 0/10 Pain location: L hip Pain description: n/a Aggravating factors: n/a Relieving factors: n/a  PRECAUTIONS: Anterior hip  RED FLAGS: None   WEIGHT BEARING RESTRICTIONS:  WBAT  FALLS:  Has patient fallen in last 6 months? No   OCCUPATION: not working  PLOF: Independent  PATIENT GOALS: To manage my hip symptoms  NEXT MD VISIT: 6 mo  OBJECTIVE:  Note: Objective measures were completed at Evaluation unless otherwise noted.  DIAGNOSTIC FINDINGS: 03/04/2023 2:21 PM EDT  XR LEFT HIP 3 VIEWS WITH PELVIS  CLINICAL INFORMATION: M25.552 Pain in left hip.  COMPARISON: 01/13/2023  FINDINGS/IMPRESSION: Left total hip arthroplasty. Orthopedic hardware is intact. No periprosthetic lucency or fracture. Expected resolution of postsurgical soft tissue swelling and soft tissue air.  Mild-to-moderate osteoarthritis of the  included right hip. Pubic symphysis and sacroiliac joints are intact. 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  Electronically Signed by:  Adah Perl, MD, [facility] Electronically Signed on:  03/04/2023 2:21 PM   PATIENT SURVEYS:  FOTO 53(66 predicted)  MUSCLE LENGTH: Hamstrings:  Left 65 deg Thomas test:  deferred  POSTURE: not tested  PALPATION: unremarkable  LOWER EXTREMITY ROM:  Passive ROM Right eval Left eval  Hip flexion  100d  Hip extension  -10d  Hip abduction  30d  Hip adduction    Hip internal rotation    Hip external rotation    Knee flexion    Knee extension    Ankle dorsiflexion    Ankle plantarflexion    Ankle inversion    Ankle eversion     (Blank rows = not tested)  LOWER EXTREMITY MMT:  MMT Right eval Left eval  Hip flexion  4  Hip extension  3  Hip abduction  3  Hip adduction    Hip internal rotation    Hip external rotation    Knee flexion    Knee extension    Ankle dorsiflexion    Ankle plantarflexion    Ankle inversion    Ankle eversion     (Blank rows = not tested)  FUNCTIONAL TESTS:  5 times sit to stand: 10s high table arms crossed  GAIT: Distance walked: 65ftx2 Assistive device utilized: Single point cane Level of assistance: Modified independence Comments: decreased L step length and knee flexion in swing phase, vaulting on LLE   TODAY'S TREATMENT:                                                                                                                              DATE: 05/02/23 Eval and HEP   PATIENT EDUCATION:  Education details: Discussed eval findings, rehab rationale and POC and patient is in agreement  Person educated: Patient Education method: Explanation Education comprehension: verbalized understanding and needs further education  HOME EXERCISE PROGRAM: Bridge Hip fallouts Hip flexor stretch Seated hamstring stretch  ASSESSMENT:  CLINICAL IMPRESSION: Patient is a 62 y.o. male who was seen today for physical therapy evaluation and treatment for L hip pain, stiffness and weakness following THA. He denies pain but presents with marked ROM and strength deficits.  He uses a cane for mobility.  5x STS time is functional from high table. Patient is a good OPPT candidate to focus on regaining hip ROM, strength  and function.  OBJECTIVE IMPAIRMENTS: Abnormal gait, decreased activity tolerance, decreased endurance, decreased mobility, difficulty walking, decreased ROM, decreased strength, impaired perceived functional ability, and pain.   ACTIVITY LIMITATIONS: carrying, lifting, sitting, standing, squatting, and stairs  PERSONAL FACTORS: Age, Fitness, Time since onset of injury/illness/exacerbation, and 1 comorbidity: CKD and chronic back pain  are also affecting patient's functional outcome.   REHAB POTENTIAL: Good  CLINICAL DECISION MAKING: Stable/uncomplicated  EVALUATION COMPLEXITY: Low   GOALS: Goals reviewed with patient? No  SHORT TERM GOALS: Target date: 05/23/2023   Patient to demonstrate independence in HEP  Baseline: TBD Goal status: INITIAL  2.  22ft ambulation with LRAD Baseline: 52ft with cane Goal status: INITIAL   LONG TERM GOALS: Target date: 06/13/2023    Patient will score at least 66% on FOTO to signify clinically meaningful improvement in functional abilities.   Baseline: 54 Goal status: INITIAL  2.  522ft ambulation with LRAD Baseline: 32ft with SPC Goal status: INITIAL  3.  Patient to negotiate 16 steps with most appropriate pattern and UE assist Baseline: TBD Goal status: INITIAL  4.  Increase L hip strength to 4/5 in deficit regions Baseline:  MMT Right eval Left eval  Hip flexion  4  Hip extension  3  Hip abduction  3   Goal status: INITIAL  5.  Increase L hip extension to 5d Baseline:  Passive ROM Right eval Left eval  Hip flexion  100d  Hip extension  -10d  Hip abduction  30d   Goal status: INITIAL    PLAN:  PT FREQUENCY: 1-2x/week  PT DURATION: 6 weeks  PLANNED INTERVENTIONS: Therapeutic exercises, Therapeutic activity, Neuromuscular re-education, Balance training, Gait training, Patient/Family education, Self Care, Joint mobilization, Stair training, Dry Needling, Spinal mobilization, Cryotherapy, Moist heat, Manual  therapy, and Re-evaluation  PLAN FOR NEXT SESSION: HEP review and update, manual techniques as appropriate, aerobic tasks, ROM and flexibility activities, strengthening and PREs, TPDN, gait and balance training as needed     Hildred Laser, PT 05/26/2023, 2:10 PM   Referring diagnosis? L THA Treatment diagnosis? (if different than referring diagnosis) L THA What was this (referring dx) caused by? [x]  Surgery []  Fall []  Ongoing issue []  Arthritis []  Other: ____________  Laterality: []  Rt [x]  Lt []  Both  Check all possible CPT codes:  *CHOOSE 10 OR LESS*    []  97110 (Therapeutic Exercise)  []  92507 (SLP Treatment)  []  97112 (Neuro Re-ed)   []  92526 (Swallowing Treatment)   []  97116 (Gait Training)   []  [id number] (Cognitive Training, 1st 15 minutes) []  97140 (Manual Therapy)   []  97130 (Cognitive Training, each add'l 15 minutes)  []  16109 (Re-evaluation)                              []  Other, List CPT Code ____________  []  97530 (Therapeutic Activities)     []  97535 (Self Care)   [x]  All codes above (97110 - 97535)  []  97012 (Mechanical Traction)  []  97014 (E-stim Unattended)  []  97032 (E-stim manual)  []  97033 (Ionto)  []  97035 (Ultrasound) []  97750 (Physical Performance Training) []  [id number] (Aquatic Therapy) []  97016 (Vasopneumatic Device) []  [id number] (Paraffin) []  97034 (Contrast Bath) []  97597 (Wound Care 1st 20 sq cm) []  97598 (Wound Care each add'l 20 sq cm) []  97760 (Orthotic Fabrication, Fitting, Training Initial) []  [id number] (Prosthetic Management and Training Initial) []  [id number] (Orthotic or Prosthetic Training/ Modification Subsequent)

## 2023-05-30 ENCOUNTER — Ambulatory Visit: Payer: Medicare HMO | Attending: Physician Assistant

## 2023-06-01 ENCOUNTER — Ambulatory Visit: Payer: Medicare HMO

## 2023-09-04 NOTE — Progress Notes (Signed)
This encounter was created in error - please disregard.

## 2024-02-01 ENCOUNTER — Telehealth: Payer: Self-pay | Admitting: Internal Medicine

## 2024-02-01 NOTE — Telephone Encounter (Signed)
 Noted. Message sent to scheduling to have pt scheduled for f/u.  Have not been seen her in over 1 yr.

## 2024-02-01 NOTE — Telephone Encounter (Signed)
 Copied from CRM [phone]. Topic: General - Other >> Feb 01, 2024  2:44 PM Avram G wrote:  Reason for CRM: heather flint- NP [facility] [facility] saw the patient today and wanted to report clinical findings  Screening for quanta flow-left lower extremities in left leg show possible severe decrease circulation.

## 2024-02-02 ENCOUNTER — Telehealth: Payer: Self-pay | Admitting: Internal Medicine

## 2024-02-02 NOTE — Telephone Encounter (Signed)
 Called patient, unable to reach patient. Left VM.

## 2024-02-02 NOTE — Telephone Encounter (Signed)
-----   Message from Barnie Louder sent at 02/01/2024  8:15 PM EDT ----- Regarding: Appt Over due for f/u visit with me for chronic disease management.

## 2024-03-28 ENCOUNTER — Telehealth: Payer: Self-pay | Admitting: Internal Medicine

## 2024-03-28 NOTE — Telephone Encounter (Addendum)
 Volunteer attempted to call patient to confirm appointment for 03/30/2024; phone number invalid.

## 2024-03-30 ENCOUNTER — Ambulatory Visit: Admitting: Internal Medicine

## 2024-05-08 ENCOUNTER — Encounter: Payer: Self-pay | Admitting: Internal Medicine

## 2024-05-08 ENCOUNTER — Other Ambulatory Visit: Payer: Self-pay

## 2024-05-08 ENCOUNTER — Ambulatory Visit: Attending: Internal Medicine | Admitting: Internal Medicine

## 2024-05-08 VITALS — BP 171/88 | HR 82 | Temp 98.2°F | Ht 73.0 in | Wt 158.0 lb

## 2024-05-08 DIAGNOSIS — Z23 Encounter for immunization: Secondary | ICD-10-CM | POA: Diagnosis not present

## 2024-05-08 DIAGNOSIS — M62838 Other muscle spasm: Secondary | ICD-10-CM | POA: Diagnosis not present

## 2024-05-08 DIAGNOSIS — F1191 Opioid use, unspecified, in remission: Secondary | ICD-10-CM

## 2024-05-08 DIAGNOSIS — I10 Essential (primary) hypertension: Secondary | ICD-10-CM

## 2024-05-08 DIAGNOSIS — F1291 Cannabis use, unspecified, in remission: Secondary | ICD-10-CM

## 2024-05-08 DIAGNOSIS — Z125 Encounter for screening for malignant neoplasm of prostate: Secondary | ICD-10-CM

## 2024-05-08 DIAGNOSIS — Z87891 Personal history of nicotine dependence: Secondary | ICD-10-CM

## 2024-05-08 DIAGNOSIS — F1491 Cocaine use, unspecified, in remission: Secondary | ICD-10-CM | POA: Diagnosis not present

## 2024-05-08 DIAGNOSIS — Z1331 Encounter for screening for depression: Secondary | ICD-10-CM

## 2024-05-08 MED ORDER — VALSARTAN 40 MG PO TABS
40.0000 mg | ORAL_TABLET | Freq: Every day | ORAL | 3 refills | Status: AC
  Filled 2024-05-08: qty 90, 90d supply, fill #0
  Filled 2024-07-31 – 2024-08-01 (×3): qty 90, 90d supply, fill #1

## 2024-05-08 MED ORDER — CYCLOBENZAPRINE HCL 5 MG PO TABS
5.0000 mg | ORAL_TABLET | Freq: Every day | ORAL | 1 refills | Status: DC | PRN
  Filled 2024-05-08: qty 30, 30d supply, fill #0
  Filled 2024-06-07: qty 30, 30d supply, fill #1

## 2024-05-08 MED ORDER — HYDROCHLOROTHIAZIDE 12.5 MG PO TABS
12.5000 mg | ORAL_TABLET | Freq: Every day | ORAL | 3 refills | Status: AC
  Filled 2024-05-08: qty 90, 90d supply, fill #0
  Filled 2024-07-31 – 2024-08-01 (×3): qty 90, 90d supply, fill #1

## 2024-05-08 NOTE — Progress Notes (Signed)
 Patient ID: Calvin [name], male    DOB: August 30, 1960  MRN: [id number]  CC: Hypertension (HTN f/u. /No questions / concerns/Flu vax administered on 05/08/2024 - C.A.)   Subjective: Calvin [name] is a 63 y.o. male who presents for chronic ds management. His concerns today include:  Pt with hx of HTN, chronic LBP, ED, tobacco, cocaine use, depression, HL, hep C treated at [facility] per pt and cured.    Discussed the use of AI scribe software for clinical note transcription with the patient, who gave verbal consent to proceed.  History of Present Illness Calvin [name] is a 63 year old male with hypertension and a history of left total hip replacement who presents for follow-up on his chronic medical conditions.  He has not been taking his blood pressure medications, valsartan  and hydrochlorothiazide , for over a year and a half due to not getting refills. He checks his blood pressure weekly using a wrist cuff and notes that it is consistently elevated, though not 'dangerously high'. He acknowledges that his readings are higher than the goal of 130/80 mmHg. He has reduced salt intake and quit smoking cigarettes about six months ago.  He underwent a left total hip replacement in June of the previous year at [facility]. He continues to experience stiffness and spasms in the hip, particularly in the mornings, but finds improvement with movement. He uses a cane for longer distances, such as shopping, but can walk without it otherwise. He has not experienced any falls. Request RF on Flexeril  to use as needed for spasms.  Pos Dep screen: He completed a depression screening today with a score of five. No feelings of depression are reported, and he attributes his well-being to engaging in art, specifically watercolors, paint, and ink, which he finds relaxing.   He has a history of substance use but has been clean for about six months, coinciding with quitting cigarettes. He last used heroin about a year and a half ago and has not  used cocaine for over a year and a half. He last used marijuana a few months ago but does not intend to continue its use. He denies any current substance use.   He also discusses a dermatological compound prescribed for dark spots on his face, which he finds effective and plans to keep on hand. Cream that is used is Hydroquinone/Tretinoin/Hydrocortisonew    Patient Active Problem List   Diagnosis Date Noted   Hepatitis C virus infection cured after antiviral drug therapy 09/24/2021   Hyperlipidemia 08/05/2021   Insomnia 08/03/2021   Tobacco dependence 08/03/2021   Opioid use disorder in remission 08/03/2021   Severe sepsis (HCC) 08/07/2019   Lobar pneumonia 08/07/2019   Diarrhea 08/07/2019   Anemia 08/07/2019   Foot laceration, right, initial encounter    Radiculopathy 03/08/2018   Tubular adenoma 05/16/2017   Depressed mood 05/16/2017   Substance abuse (HCC) 05/16/2017   Hallux valgus 08/03/2016   Disc disorder    Lumbar facet arthropathy 08/06/2014   Lumbar radiculopathy 08/06/2014   ED (erectile dysfunction) 01/07/2014   Vitamin D  deficiency 01/07/2014   Left shoulder pain 08/10/2013   Chronic low back pain 08/10/2013   Essential hypertension 08/10/2013   Tobacco abuse 08/10/2013     Current Outpatient Medications on File Prior to Visit  Medication Sig Dispense Refill   gabapentin  (NEURONTIN ) 300 MG capsule Take 1 capsule by mouth 3 (three) times daily. (Patient not taking: Reported on 05/08/2024)     SUBLOCADE 300 MG/1.5ML SOSY Inject  into the skin. (Patient not taking: Reported on 05/08/2024)     No current facility-administered medications on file prior to visit.    Allergies  Allergen Reactions   Norvasc  [Amlodipine ]     Swelling in feet    Social History   Socioeconomic History   Marital status: Single    Spouse name: Not on file   Number of children: Not on file   Years of education: Not on file   Highest education level: Not on file  Occupational  History   Not on file  Tobacco Use   Smoking status: Every Day    Current packs/day: 0.10    Average packs/day: 0.1 packs/day for 20.0 years (2.0 ttl pk-yrs)    Types: Cigarettes   Smokeless tobacco: Never   Tobacco comments:    5 cigs a day   Vaping Use   Vaping status: Never Used  Substance and Sexual Activity   Alcohol use: No   Drug use: No   Sexual activity: Not Currently  Other Topics Concern   Not on file  Social History Narrative   Not on file   Social Drivers of Health   Financial Resource Strain: Low Risk  (10/17/2023)   Received from [facility]   Overall Financial Resource Strain (CARDIA)    Difficulty of Paying Living Expenses: Not hard at all  Food Insecurity: No Food Insecurity (10/17/2023)   Received from [facility]   Hunger Vital Sign    Within the past 12 months, you worried that your food would run out before you got the money to buy more.: Never true    Within the past 12 months, the food you bought just didn't last and you didn't have money to get more.: Never true  Transportation Needs: No Transportation Needs (10/17/2023)   Received from [facility] - Transportation    In the past 12 months, has lack of transportation kept you from medical appointments or from getting medications?: No    Lack of Transportation (Non-Medical): No  Physical Activity: Inactive (01/04/2023)   Exercise Vital Sign    Days of Exercise per Week: 0 days    Minutes of Exercise per Session: 0 min  Stress: No Stress Concern Present (01/04/2023)   [employer] of Occupational Health - Occupational Stress Questionnaire    Feeling of Stress : Not at all  Social Connections: Socially Isolated (01/04/2023)   Social Connection and Isolation Panel    Frequency of Communication with Friends and Family: More than three times a week    Frequency of Social Gatherings with Friends and Family: Three times a week    Attends  Religious Services: Never    Active Member of Clubs or Organizations: No    Attends Banker Meetings: Never    Marital Status: Never married  Intimate Partner Violence: Not At Risk (01/04/2023)   Humiliation, Afraid, Rape, and Kick questionnaire    Fear of Current or Ex-Partner: No    Emotionally Abused: No    Physically Abused: No    Sexually Abused: No    Family History  Problem Relation Age of Onset   Diabetes Mother    Hypertension Mother    Heart disease Mother    Hyperlipidemia Mother    Diabetes Brother    Diabetes Daughter    Hypertension Sister    Diabetes Sister    Stomach cancer Maternal Grandfather    Colon cancer Neg Hx  Esophageal cancer Neg Hx    Rectal cancer Neg Hx    Colon polyps Neg Hx     Past Surgical History:  Procedure Laterality Date   ANTERIOR CERVICAL DECOMP/DISCECTOMY FUSION N/A 03/08/2018   Procedure: ANTERIOR CERVICAL DECOMPRESSION/DISCECTOMY FUSION , cervical 3-4, cervical 4-5, cervical 5-6 with intrumentational and allograft;  Surgeon: Beuford Anes, MD;  Location: [facility] OR;  Service: Orthopedics;  Laterality: N/A;   BONE BIOPSY Right 05/17/2018   Procedure: BONE BIOPSY X2;  Surgeon: Gretel Ozell PARAS, DPM;  Location: [facility];  Service: Podiatry;  Laterality: Right;  right second toe   COLONOSCOPY     FOOT SURGERY     right, ~ 2002   POLYPECTOMY      ROS: Review of Systems Negative except as stated above  PHYSICAL EXAM: BP (!) 171/88 (BP Location: Left Arm, Patient Position: Sitting, Cuff Size: Normal)   Pulse 82   Temp 98.2 F (36.8 C) (Oral)   Ht 6' 1 (1.854 m)   Wt 158 lb (71.7 kg)   SpO2 100%   BMI 20.85 kg/m   Physical Exam  General appearance - alert, well appearing, and in no distress Mental status - normal mood, behavior, speech, dress, motor activity, and thought processes Neck - supple, no significant adenopathy Chest - clear to auscultation, no wheezes, rales or rhonchi, symmetric air  entry Heart - normal rate, regular rhythm, normal S1, S2, no murmurs, rubs, clicks or gallops Musculoskeletal -gait is slow.  He has a cane with him. Extremities - peripheral pulses normal, no pedal edema, no clubbing or cyanosis     05/08/2024    3:08 PM 01/04/2023    3:55 PM 09/24/2021   10:17 AM  Depression screen PHQ 2/9  Decreased Interest 1 0 2  Down, Depressed, Hopeless 1 0 2  PHQ - 2 Score 2 0 4  Altered sleeping 1  3  Tired, decreased energy 2  1  Change in appetite 0  3  Feeling bad or failure about yourself  0  0  Trouble concentrating 0  1  Moving slowly or fidgety/restless 0  0  Suicidal thoughts 0  0  PHQ-9 Score 5  12  Difficult doing work/chores Somewhat difficult         Latest Ref Rng & Units 09/24/2021   11:37 AM 09/19/2021    3:25 PM 09/08/2021    7:37 PM  CMP  Glucose 70 - 99 mg/dL 88  95  78   BUN 8 - 27 mg/dL 14  22  20    Creatinine 0.76 - 1.27 mg/dL 8.65  8.40  8.69   Sodium 134 - 144 mmol/L 143  134  137   Potassium 3.5 - 5.2 mmol/L 4.6  4.6  4.4   Chloride 96 - 106 mmol/L 105  101  104   CO2 20 - 29 mmol/L 26  23  22    Calcium  8.6 - 10.2 mg/dL 9.0  8.5  8.9   Total Protein 6.5 - 8.1 g/dL  7.4  7.8   Total Bilirubin 0.3 - 1.2 mg/dL  0.3  0.9   Alkaline Phos 38 - 126 U/L  78  95   AST 15 - 41 U/L  26  32   ALT 0 - 44 U/L  17  24    Lipid Panel     Component Value Date/Time   CHOL 195 08/03/2021 1436   TRIG 56 08/03/2021 1436   HDL 56 08/03/2021 1436  CHOLHDL 3.5 08/03/2021 1436   CHOLHDL 4.7 12/01/2015 1437   VLDL 18 12/01/2015 1437   LDLCALC 129 (H) 08/03/2021 1436    CBC    Component Value Date/Time   WBC 6.5 09/19/2021 1525   RBC 4.52 09/19/2021 1525   HGB 12.9 (L) 09/19/2021 1525   HGB 12.3 (L) 08/03/2021 1436   HCT 38.4 (L) 09/19/2021 1525   HCT 37.4 (L) 08/03/2021 1436   PLT 186 09/19/2021 1525   PLT 227 08/03/2021 1436   MCV 85.0 09/19/2021 1525   MCV 84 08/03/2021 1436   MCH 28.5 09/19/2021 1525   MCHC 33.6 09/19/2021  1525   RDW 14.8 09/19/2021 1525   RDW 14.9 08/03/2021 1436   LYMPHSABS 1.0 09/08/2021 1936   LYMPHSABS 2.2 09/05/2019 1423   MONOABS 1.0 09/08/2021 1936   EOSABS 0.2 09/08/2021 1936   EOSABS 0.2 09/05/2019 1423   BASOSABS 0.1 09/08/2021 1936   BASOSABS 0.1 09/05/2019 1423    ASSESSMENT AND PLAN: 1. Essential hypertension (Primary) Not at goal.  Restart valsartan  40 mg daily and hydrochlorothiazide  12.5 mg daily.  DASH diet encouraged.  Will have him follow-up in several weeks to see me for his Medicare wellness visit.  At that time we will recheck blood pressure. - valsartan  (DIOVAN ) 40 MG tablet; Take 1 tablet (40 mg total) by mouth daily.  Dispense: 90 tablet; Refill: 3 - CBC - Comprehensive metabolic panel with GFR - Lipid panel - hydrochlorothiazide  (HYDRODIURIL ) 12.5 MG tablet; Take 1 tablet (12.5 mg total) by mouth daily.  Dispense: 90 tablet; Refill: 3  2. Former smoker Commended him on quitting.  Encouraged him to remain tobacco free  3. Muscle spasm - cyclobenzaprine  (FLEXERIL ) 5 MG tablet; Take 1 tablet (5 mg total) by mouth daily as needed for muscle spasms.  Dispense: 30 tablet; Refill: 1  4. Positive screening for depression on 9-item Patient Health Questionnaire (PHQ-9) Patient reports this is not a major issue for him at this time and does not need medication or counseling.  5. Prostate cancer screening Patient agreeable to screening - PSA  6. Cocaine use disorder in remission Encouraged him to remain free of cocaine and other substances  7. Marijuana use disorder in remission See #6 above  8. Opioid use disorder in remission Commended him on being drug-free.  Encouraged him to remain free of heroin use.  9. Need for immunization against influenza - Flu vaccine trivalent PF, 6mos and older(Flulaval,Afluria,Fluarix,Fluzone)    Patient was given the opportunity to ask questions.  Patient verbalized understanding of the plan and was able to repeat key  elements of the plan.   This documentation was completed using Paediatric nurse.  Any transcriptional errors are unintentional.  Orders Placed This Encounter  Procedures   Flu vaccine trivalent PF, 6mos and older(Flulaval,Afluria,Fluarix,Fluzone)   CBC   Comprehensive metabolic panel with GFR   Lipid panel   PSA     Requested Prescriptions   Signed Prescriptions Disp Refills   cyclobenzaprine  (FLEXERIL ) 5 MG tablet 30 tablet 1    Sig: Take 1 tablet (5 mg total) by mouth daily as needed for muscle spasms.   valsartan  (DIOVAN ) 40 MG tablet 90 tablet 3    Sig: Take 1 tablet (40 mg total) by mouth daily.   hydrochlorothiazide  (HYDRODIURIL ) 12.5 MG tablet 90 tablet 3    Sig: Take 1 tablet (12.5 mg total) by mouth daily.    Return in about 5 weeks (around 06/12/2024) for Medicare Wellness Visit  with Dr. Vicci.  Barnie Vicci, MD, FACP

## 2024-05-08 NOTE — Patient Instructions (Signed)
  VISIT SUMMARY: Today, you came in for a follow-up on your chronic medical conditions, including hypertension and post-hip replacement care. We discussed your current health status, medication adherence, and lifestyle changes. You also completed a depression screening and shared updates on your substance use history and artistic pursuits.  YOUR PLAN: -HYPERTENSION: Hypertension means high blood pressure. You have not been taking your blood pressure medications for over a year, and your readings are higher than the goal of 130/80 mmHg. We will restart your medications, valsartan  and hydrochlorothiazide , and recheck your blood pressure to see if the medications are necessary. Please continue to limit your dietary sodium. We will also order kidney function tests to monitor your health.  -STATUS POST LEFT TOTAL HIP REPLACEMENT: This refers to your condition after having a hip replacement surgery. You are managing well with physical therapy and using a cane for longer distances. Continue with your physical therapy and follow up with [facility] in two years.  -MUSCLE SPASM OF LEFT HIP: Muscle spasms are involuntary contractions of your muscles. You experience stiffness and spasms in your left hip, which improve with movement. We will prescribe Flexeril  to help manage these spasms.  -NICOTINE , OPIOID, COCAINE, AND CANNABIS USE DISORDERS IN REMISSION: This means you have a history of substance use but have been clean for varying periods. You have been free from nicotine  for over six months, opioids and cocaine for over a year and a half, and last used cannabis a few months ago. You intend to abstain from further use.  -GENERAL HEALTH MAINTENANCE: You are due for a flu vaccination and need updated blood tests and a Medicare wellness visit. We will administer the flu shot today, schedule your Medicare wellness visit, and order blood tests to check your cholesterol, kidney and liver function, and PSA  levels.  INSTRUCTIONS: Please restart taking valsartan  and hydrochlorothiazide  as prescribed. Limit your dietary sodium and monitor your blood pressure regularly. Continue with your physical therapy for your hip and use Flexeril  as needed for muscle spasms. We will administer your flu shot today. Schedule your Medicare wellness visit and complete the ordered blood tests. Follow up with [facility] in two years for your hip replacement care.                      Contains text generated by [employer]bridge.                                 Contains text generated by [employer]bridge.

## 2024-05-09 ENCOUNTER — Other Ambulatory Visit: Payer: Self-pay

## 2024-05-09 ENCOUNTER — Ambulatory Visit: Payer: Self-pay | Admitting: Internal Medicine

## 2024-05-09 LAB — LIPID PANEL
Chol/HDL Ratio: 3.6 ratio (ref 0.0–5.0)
Cholesterol, Total: 162 mg/dL (ref 100–199)
HDL: 45 mg/dL (ref >39)
LDL Chol Calc (NIH): 107 mg/dL — ABNORMAL HIGH (ref 0–99)
Triglycerides: 47 mg/dL (ref 0–149)
VLDL Cholesterol Cal: 10 mg/dL (ref 5–40)

## 2024-05-09 LAB — CBC
Hematocrit: 43.3 % (ref 37.5–51.0)
Hemoglobin: 14.1 g/dL (ref 13.0–17.7)
MCH: 29.9 pg (ref 26.6–33.0)
MCHC: 32.6 g/dL (ref 31.5–35.7)
MCV: 92 fL (ref 79–97)
Platelets: 169 x10E3/uL (ref 150–450)
RBC: 4.71 x10E6/uL (ref 4.14–5.80)
RDW: 14.4 % (ref 11.6–15.4)
WBC: 4.4 x10E3/uL (ref 3.4–10.8)

## 2024-05-09 LAB — PSA: Prostate Specific Ag, Serum: 0.9 ng/mL (ref 0.0–4.0)

## 2024-05-09 LAB — COMPREHENSIVE METABOLIC PANEL WITH GFR
ALT: 19 IU/L (ref 0–44)
AST: 28 IU/L (ref 0–40)
Albumin: 4.5 g/dL (ref 3.9–4.9)
Alkaline Phosphatase: 110 IU/L (ref 47–123)
BUN/Creatinine Ratio: 11 (ref 10–24)
BUN: 15 mg/dL (ref 8–27)
Bilirubin Total: 0.2 mg/dL (ref 0.0–1.2)
CO2: 21 mmol/L (ref 20–29)
Calcium: 9.1 mg/dL (ref 8.6–10.2)
Chloride: 108 mmol/L — ABNORMAL HIGH (ref 96–106)
Creatinine, Ser: 1.31 mg/dL — ABNORMAL HIGH (ref 0.76–1.27)
Globulin, Total: 2.4 g/dL (ref 1.5–4.5)
Glucose: 84 mg/dL (ref 70–99)
Potassium: 4.6 mmol/L (ref 3.5–5.2)
Sodium: 143 mmol/L (ref 134–144)
Total Protein: 6.9 g/dL (ref 6.0–8.5)
eGFR: 61 mL/min/1.73 (ref >59)

## 2024-06-07 ENCOUNTER — Encounter (HOSPITAL_COMMUNITY): Payer: Self-pay

## 2024-06-07 ENCOUNTER — Other Ambulatory Visit: Payer: Self-pay

## 2024-06-07 ENCOUNTER — Emergency Department (HOSPITAL_COMMUNITY)
Admission: EM | Admit: 2024-06-07 | Discharge: 2024-06-07 | Disposition: A | Discharge status: Home / Self Care | Attending: Emergency Medicine | Admitting: Emergency Medicine

## 2024-06-07 DIAGNOSIS — Z79899 Other long term (current) drug therapy: Secondary | ICD-10-CM | POA: Insufficient documentation

## 2024-06-07 DIAGNOSIS — L720 Epidermal cyst: Secondary | ICD-10-CM | POA: Diagnosis not present

## 2024-06-07 DIAGNOSIS — D2330 Other benign neoplasm of skin of unspecified part of face: Secondary | ICD-10-CM

## 2024-06-07 DIAGNOSIS — L0201 Cutaneous abscess of face: Secondary | ICD-10-CM | POA: Insufficient documentation

## 2024-06-07 DIAGNOSIS — R030 Elevated blood-pressure reading, without diagnosis of hypertension: Secondary | ICD-10-CM

## 2024-06-07 DIAGNOSIS — I10 Essential (primary) hypertension: Secondary | ICD-10-CM | POA: Insufficient documentation

## 2024-06-07 MED ORDER — ACETAMINOPHEN 500 MG PO TABS
1000.0000 mg | ORAL_TABLET | Freq: Once | ORAL | Status: AC
  Administered 2024-06-07: 1000 mg via ORAL
  Filled 2024-06-07: qty 2

## 2024-06-07 MED ORDER — LIDOCAINE-EPINEPHRINE (PF) 2 %-1:200000 IJ SOLN
20.0000 mL | Freq: Once | INTRAMUSCULAR | Status: AC
  Administered 2024-06-07: 20 mL
  Filled 2024-06-07: qty 20

## 2024-06-07 MED ORDER — DOXYCYCLINE HYCLATE 100 MG PO TABS
100.0000 mg | ORAL_TABLET | Freq: Once | ORAL | Status: AC
  Administered 2024-06-07: 100 mg via ORAL
  Filled 2024-06-07: qty 1

## 2024-06-07 MED ORDER — DOXYCYCLINE HYCLATE 100 MG PO CAPS: 100.0000 mg | ORAL_CAPSULE | Freq: Two times a day (BID) | ORAL | 0 refills | Status: DC

## 2024-06-07 NOTE — ED Triage Notes (Signed)
 Patient reports that he has a spot on his face or a few years. He reports that he thinks it is infected. It is on the right side near the lower cheek and chin. It is red and painful. Denies drainage and fever.

## 2024-06-07 NOTE — ED Provider Notes (Signed)
 [facility] AT [facility] Provider Note   CSN: [id number] Arrival date & time: 06/07/24  1339     Patient presents with: Abscess   Calvin [name] is a 63 y.o. male.   Pt c/o 'abscess' to right lower face. Indicates he had a small cyst there for years, but in past few days has become larger, erythematous and sore. No hx mrsa or frequent abscesses. Otherwise does not feel systemically sick or ill. No malaise, no nv. No fever or chills. Denies bite or sting to area.  The history is provided by the patient and medical records.  Abscess Associated symptoms: no fever and no headaches        Prior to Admission medications   Medication Sig Start Date End Date Taking? Authorizing Provider  cyclobenzaprine  (FLEXERIL ) 5 MG tablet Take 1 tablet (5 mg total) by mouth daily as needed for muscle spasms. 05/08/24   Vicci Barnie NOVAK, MD  gabapentin  (NEURONTIN ) 300 MG capsule Take 1 capsule by mouth 3 (three) times daily. Patient not taking: Reported on 05/08/2024 09/28/20   [provider]  hydrochlorothiazide  (HYDRODIURIL ) 12.5 MG tablet Take 1 tablet (12.5 mg total) by mouth daily. 05/08/24   Vicci Barnie NOVAK, MD  SUBLOCADE 300 MG/1.5ML SOSY Inject into the skin. Patient not taking: Reported on 05/08/2024 07/15/21   [provider]  valsartan  (DIOVAN ) 40 MG tablet Take 1 tablet (40 mg total) by mouth daily. 05/08/24   Vicci Barnie NOVAK, MD    Allergies: Norvasc  [amlodipine ]    Review of Systems  Constitutional:  Negative for chills and fever.  HENT:  Negative for dental problem, sore throat and trouble swallowing.   Musculoskeletal:  Negative for neck pain and neck stiffness.  Skin:  Negative for rash.  Neurological:  Negative for headaches.    Updated Vital Signs BP (!) 141/93 (BP Location: Right Arm)   Pulse 85   Temp (!) 97.5 F (36.4 C)   Resp 18   SpO2 94%   Physical Exam Vitals and nursing note reviewed.  Constitutional:       Appearance: Normal appearance. He is well-developed.  HENT:     Head: Atraumatic.     Nose: Nose normal.     Mouth/Throat:     Mouth: Mucous membranes are moist.     Pharynx: Oropharynx is clear.  Eyes:     General: No scleral icterus.    Conjunctiva/sclera: Conjunctivae normal.  Neck:     Trachea: No tracheal deviation.  Cardiovascular:     Rate and Rhythm: Normal rate.     Pulses: Normal pulses.  Pulmonary:     Effort: Pulmonary effort is normal. No accessory muscle usage or respiratory distress.  Musculoskeletal:        General: No swelling or tenderness.     Cervical back: Neck supple. No rigidity.  Skin:    General: Skin is warm and dry.     Findings: No rash.     Comments: ~ 2 cm diameter abscess ?infected cyst, right lower face.   Neurological:     Mental Status: He is alert.     Comments: Alert, speech clear.   Psychiatric:        Mood and Affect: Mood normal.     (all labs ordered are listed, but only abnormal results are displayed) Labs Reviewed - No data to display  EKG: None  Radiology: No results found.   .Incision and Drainage  Date/Time: 06/07/2024 8:29 PM  Performed  by: Bernard Drivers, MD Authorized by: Bernard Drivers, MD   Consent:    Consent obtained:  Verbal   Consent given by:  Patient Location:    Type:  Abscess   Location:  Head   Head location:  Face Pre-procedure details:    Skin preparation:  Chlorhexidine with alcohol Anesthesia:    Anesthesia method:  Local infiltration   Local anesthetic:  Lidocaine  2% WITH epi Procedure type:    Complexity:  Complex Procedure details:    Incision types:  Single straight   Incision depth:  Subcutaneous   Wound management:  Probed and deloculated   Drainage:  Purulent   Drainage amount:  Moderate   Wound treatment:  Wound left open   Packing materials:  None Post-procedure details:    Procedure completion:  Tolerated well, no immediate complications    Medications Ordered in the ED   lidocaine -EPINEPHrine  (XYLOCAINE  W/EPI) 2 %-1:200000 (PF) injection 20 mL (has no administration in time range)                                    Medical Decision Making Problems Addressed: Abscess of face: acute illness or injury Cyst, dermoid, face: chronic illness or injury Elevated blood pressure reading: acute illness or injury Essential hypertension: chronic illness or injury with exacerbation, progression, or side effects of treatment that poses a threat to life or bodily functions  Amount and/or Complexity of Data Reviewed External Data Reviewed: notes.  Risk OTC drugs. Prescription drug management.   I and D.  Reviewed nursing notes and prior charts for additional history.   I and D'd, all pus removed/expressed. I discussed w pt, that as cyst present for years, and not acutely infected/abscess, suspect cyst remains and if swelling increases, rec gen surg outpatient f/u.   Pt requests abx rx. Doxycycline po/rx.   Pt appears stable for ED d/c.   Return precautions provided.        Final diagnoses:  None    ED Discharge Orders     None          Bernard Drivers, MD 06/07/24 2029

## 2024-06-07 NOTE — ED Provider Triage Note (Signed)
 Emergency Medicine Provider Triage Evaluation Note  Calvin [name] , a 63 y.o. male  was evaluated in triage.  Pt complains of patient abscess.  Has been progressively worsening over the past few days history of the same and had it drained.  No fever or chills  Review of Systems  Positive: Flame sebaceous cyst Negative: Fever  Physical Exam  BP (!) 141/93 (BP Location: Right Arm)   Pulse 85   Temp (!) 97.5 F (36.4 C)   Resp 18   SpO2 94%  Gen:   Awake, no distress   Resp:  Normal effort  MSK:   Moves extremities without difficulty  Other:  Inflamed and fluctuant sebaceous cyst on the right lower part of the face  Medical Decision Making  Medically screening exam initiated at 2:28 PM.  Appropriate orders placed.  Calvin [name] was informed that the remainder of the evaluation will be completed by another provider, this initial triage assessment does not replace that evaluation, and the importance of remaining in the ED until their evaluation is complete.     Calvin Chroman, PA-C 06/07/24 1429

## 2024-06-07 NOTE — Discharge Instructions (Signed)
 It was our pleasure to provide your ER care today - we hope that you feel better.  Keep area very clean. Take antibiotic (doxycycline as prescribed). Take acetaminophen  or ibuprofen  as need for pain.   Return to your doctor in coming week. Also follow up with your doctor in for your high blood pressure. If cyst recurs, follow up with general surgery to discuss possible minor surgical procedure to remove cyst.   Return to ER if worse, new symptoms, high fevers, increased swelling/spreading redness, severe/intractable pain, or other concern.

## 2024-06-07 NOTE — ED Notes (Signed)
Suture cart and lidocaine to bedside.

## 2024-06-08 ENCOUNTER — Other Ambulatory Visit: Payer: Self-pay

## 2024-06-12 ENCOUNTER — Ambulatory Visit (INDEPENDENT_AMBULATORY_CARE_PROVIDER_SITE_OTHER)

## 2024-06-13 ENCOUNTER — Telehealth: Payer: Self-pay | Admitting: Internal Medicine

## 2024-06-13 NOTE — Telephone Encounter (Signed)
 Confirmed appt for 11/20.

## 2024-06-14 ENCOUNTER — Ambulatory Visit: Admitting: Internal Medicine

## 2024-06-14 ENCOUNTER — Telehealth: Payer: Self-pay | Admitting: Internal Medicine

## 2024-06-14 NOTE — Telephone Encounter (Signed)
 Contacted patient. Patient scheduled for 12/4 at 9:30 am.

## 2024-06-14 NOTE — Telephone Encounter (Signed)
 Copied from CRM #[id number]. Topic: Appointments - Appointment Info/Confirmation >> Jun 14, 2024 10:10 AM Montie POUR wrote:  Patient/patient representative is calling for information regarding an appointment.  He needs a hospital follow up appointment. He was in the emergency on 06/07/24. There is no appointment available in the 14 day period. Please call him at [phone] to schedule.  He is out of town today and will be back next Thursday.

## 2024-06-26 NOTE — Progress Notes (Unsigned)
   Established Patient Office Visit  Subjective   Patient ID: Calvin [name], male    DOB: 09-01-1960  Age: 63 y.o. MRN: [id number]  No chief complaint on file.   PCP johnson F/u hosp abscess on face  ED 06/07/24  Pt c/o 'abscess' to right lower face. Indicates he had a small cyst there for years, but in past few days has become larger, erythematous and sore. No hx mrsa or frequent abscesses. Otherwise does not feel systemically sick or ill. No malaise, no nv. No fever or chills. Denies bite or sting to area. Drained in ED I and D'd, all pus removed/expressed. I discussed w pt, that as cyst present for years, and not acutely infected/abscess, suspect cyst remains and if swelling increases, rec gen surg outpatient f/u.    Pt requests abx rx. Doxycycline  po/rx.        {History (Optional):23778}  ROS    Objective:     There were no vitals taken for this visit. {Vitals History (Optional):23777}  Physical Exam   No results found for any visits on 06/28/24.  {Labs (Optional):23779}  The 10-year ASCVD risk score (Arnett DK, et al., 2019) is: 27.6%    Assessment & Plan:   Problem List Items Addressed This Visit   None   No follow-ups on file.    Belvie Silvan, MD

## 2024-06-28 ENCOUNTER — Ambulatory Visit: Attending: Critical Care Medicine | Admitting: Critical Care Medicine

## 2024-06-28 ENCOUNTER — Encounter: Payer: Self-pay | Admitting: Critical Care Medicine

## 2024-06-28 VITALS — BP 113/79 | HR 107 | Resp 20 | Ht 73.0 in | Wt 159.9 lb

## 2024-06-28 DIAGNOSIS — I10 Essential (primary) hypertension: Secondary | ICD-10-CM | POA: Diagnosis not present

## 2024-06-28 DIAGNOSIS — L0201 Cutaneous abscess of face: Secondary | ICD-10-CM | POA: Diagnosis not present

## 2024-06-28 NOTE — Assessment & Plan Note (Signed)
 Skin abscess likely infected dermoid cyst now resolved with drainage and off antibiotics will observe for now

## 2024-06-28 NOTE — Assessment & Plan Note (Signed)
Hypertension well-controlled no changes 

## 2024-06-28 NOTE — Patient Instructions (Signed)
 No additional treatments needed on the cyst on the face if this comes back in the next few months please call us  we may need to refer you to a surgeon to have the cyst removed but I doubt this will be necessary  No other changes to your medications  Keep your primary care follow-up visit

## 2024-07-31 ENCOUNTER — Other Ambulatory Visit: Payer: Self-pay | Admitting: Internal Medicine

## 2024-07-31 ENCOUNTER — Other Ambulatory Visit: Payer: Self-pay

## 2024-07-31 ENCOUNTER — Other Ambulatory Visit (HOSPITAL_COMMUNITY): Payer: Self-pay

## 2024-07-31 DIAGNOSIS — M62838 Other muscle spasm: Secondary | ICD-10-CM

## 2024-08-01 ENCOUNTER — Encounter: Payer: Self-pay | Admitting: Physician Assistant

## 2024-08-01 ENCOUNTER — Other Ambulatory Visit: Payer: Self-pay

## 2024-08-01 MED ORDER — CYCLOBENZAPRINE HCL 5 MG PO TABS
5.0000 mg | ORAL_TABLET | Freq: Every day | ORAL | 1 refills | Status: AC | PRN
  Filled 2024-08-01: qty 30, 30d supply, fill #0
  Filled 2024-08-17 – 2024-08-24 (×2): qty 30, 30d supply, fill #1

## 2024-08-17 ENCOUNTER — Other Ambulatory Visit: Payer: Self-pay

## 2024-08-23 NOTE — Progress Notes (Unsigned)
 "  Chief Complaint: Chronic constipation  HPI:    Calvin [name] is a 64 year old African-American male, previously known to Dr. Aneita, with a past medical history as listed below including constipation, depression and multiple others, who was referred to me by Vicci Barnie NOVAK, MD for a complaint of chronic constipation.      03/24/2017 colonoscopy with Dr. Aneita with unsatisfactory prep and a 7 mm polyp in the colon.  Pathology showed tubular adenoma.  Repeat recommended immediately with an extended bowel prep.    09/19/2021 CTAP with contrast showed no acute findings.    05/08/2024 CMP with a creatinine of 1.31 and otherwise normal.  CBC normal.  Past Medical History:  Diagnosis Date   Anxiety    Arthritis    back, shoulders    Constipation    Depression    Disc disorder    Hypertension    Lumbar herniated disc    Muscle spasm    Tobacco dependence 08/03/2021    Past Surgical History:  Procedure Laterality Date   ANTERIOR CERVICAL DECOMP/DISCECTOMY FUSION N/A 03/08/2018   Procedure: ANTERIOR CERVICAL DECOMPRESSION/DISCECTOMY FUSION , cervical 3-4, cervical 4-5, cervical 5-6 with intrumentational and allograft;  Surgeon: Beuford Anes, MD;  Location: [facility] OR;  Service: Orthopedics;  Laterality: N/A;   BONE BIOPSY Right 05/17/2018   Procedure: BONE BIOPSY X2;  Surgeon: Gretel Ozell PARAS, DPM;  Location: [facility];  Service: Podiatry;  Laterality: Right;  right second toe   COLONOSCOPY     FOOT SURGERY     right, ~ 2002   POLYPECTOMY      Current Outpatient Medications  Medication Sig Dispense Refill   aspirin EC 81 MG tablet Take 81 mg by mouth daily.     cyclobenzaprine  (FLEXERIL ) 5 MG tablet Take 1 tablet (5 mg total) by mouth daily as needed for muscle spasms. 30 tablet 1   fluticasone (CUTIVATE) 0.05 % cream Apply 1 Application topically daily.     gabapentin  (NEURONTIN ) 300 MG capsule Take 1 capsule by mouth 3 (three) times daily. (Patient not taking: Reported on  06/28/2024)     hydrochlorothiazide  (HYDRODIURIL ) 12.5 MG tablet Take 1 tablet (12.5 mg total) by mouth daily. 90 tablet 3   valsartan  (DIOVAN ) 40 MG tablet Take 1 tablet (40 mg total) by mouth daily. 90 tablet 3   No current facility-administered medications for this visit.    Allergies as of 08/24/2024 - Review Complete 06/28/2024  Allergen Reaction Noted   Norvasc  [amlodipine ]  05/21/2020    Family History  Problem Relation Age of Onset   Diabetes Mother    Hypertension Mother    Heart disease Mother    Hyperlipidemia Mother    Diabetes Brother    Diabetes Daughter    Hypertension Sister    Diabetes Sister    Stomach cancer Maternal Grandfather    Colon cancer Neg Hx    Esophageal cancer Neg Hx    Rectal cancer Neg Hx    Colon polyps Neg Hx     Social History   Socioeconomic History   Marital status: Single    Spouse name: Not on file   Number of children: Not on file   Years of education: Not on file   Highest education level: Not on file  Occupational History   Not on file  Tobacco Use   Smoking status: Former    Current packs/day: 0.10    Average packs/day: 0.1 packs/day for 20.0 years (2.0 ttl pk-yrs)  Types: Cigarettes   Smokeless tobacco: Never   Tobacco comments:    5 cigs a day ; Patient no longer smoking.  Vaping Use   Vaping status: Never Used  Substance and Sexual Activity   Alcohol use: No   Drug use: No   Sexual activity: Not Currently  Other Topics Concern   Not on file  Social History Narrative   Not on file   Social Drivers of Health   Tobacco Use: Medium Risk (06/28/2024)   Patient History    Smoking Tobacco Use: Former    Smokeless Tobacco Use: Never    Passive Exposure: Not on file  Financial Resource Strain: Low Risk  (10/17/2023)   Received from [facility]   Overall Financial Resource Strain (CARDIA)    Difficulty of Paying Living Expenses: Not hard at all  Food Insecurity: No Food Insecurity (10/17/2023)    Received from [facility]   Epic    Within the past 12 months, you worried that your food would run out before you got the money to buy more.: Never true    Within the past 12 months, the food you bought just didn't last and you didn't have money to get more.: Never true  Transportation Needs: No Transportation Needs (10/17/2023)   Received from [facility] - Transportation    In the past 12 months, has lack of transportation kept you from medical appointments or from getting medications?: No    Lack of Transportation (Non-Medical): No  Physical Activity: Inactive (01/04/2023)   Exercise Vital Sign    Days of Exercise per Week: 0 days    Minutes of Exercise per Session: 0 min  Stress: No Stress Concern Present (01/04/2023)   [employer] - Occupational Stress Questionnaire    Feeling of Stress : Not at all  Social Connections: Socially Isolated (01/04/2023)   Social Connection and Isolation Panel    Frequency of Communication with Friends and Family: More than three times a week    Frequency of Social Gatherings with Friends and Family: Three times a week    Attends Religious Services: Never    Active Member of Clubs or Organizations: No    Attends Banker Meetings: Never    Marital Status: Never married  Intimate Partner Violence: Not At Risk (01/04/2023)   Humiliation, Afraid, Rape, and Kick questionnaire    Fear of Current or Ex-Partner: No    Emotionally Abused: No    Physically Abused: No    Sexually Abused: No  Depression (PHQ2-9): Medium Risk (05/08/2024)   Depression (PHQ2-9)    PHQ-2 Score: 5  Alcohol Screen: Low Risk (01/04/2023)   Alcohol Screen    Last Alcohol Screening Score (AUDIT): 0  Housing: Low Risk  (10/17/2023)   Received from [facility]   Epic    In the last 12 months, was there a time when you were not able to pay the mortgage or rent on time?: No    In the  past 12 months, how many times have you moved where you were living?: 0    At any time in the past 12 months, were you homeless or living in a shelter (including now)?: No  Utilities: Not At Risk (01/13/2023)   Received from [facility] Utilities    Threatened with loss of utilities: No  Health Literacy: Not on file    Review  of Systems:    Constitutional: No weight loss, fever, chills, weakness or fatigue HEENT: Eyes: No change in vision               Ears, Nose, Throat:  No change in hearing or congestion Skin: No rash or itching Cardiovascular: No chest pain, chest pressure or palpitations   Respiratory: No SOB or cough Gastrointestinal: See HPI and otherwise negative Genitourinary: No dysuria or change in urinary frequency Neurological: No headache, dizziness or syncope Musculoskeletal: No new muscle or joint pain Hematologic: No bleeding or bruising Psychiatric: No history of depression or anxiety    Physical Exam:  Vital signs: There were no vitals taken for this visit.  Constitutional:   Pleasant Caucasian male appears to be in NAD, Well developed, Well nourished, alert and cooperative Head:  Normocephalic and atraumatic. Eyes:   PEERL, EOMI. No icterus. Conjunctiva pink. Ears:  Normal auditory acuity. Neck:  Supple Throat: Oral cavity and pharynx without inflammation, swelling or lesion.  Respiratory: Respirations even and unlabored. Lungs clear to auscultation bilaterally.   No wheezes, crackles, or rhonchi.  Cardiovascular: Normal S1, S2. No MRG. Regular rate and rhythm. No peripheral edema, cyanosis or pallor.  Gastrointestinal:  Soft, nondistended, nontender. No rebound or guarding. Normal bowel sounds. No appreciable masses or hepatomegaly. Rectal:  Not performed.  Msk:  Symmetrical without gross deformities. Without edema, no deformity or joint abnormality.  Neurologic:  Alert and  oriented x4;  grossly normal neurologically.  Skin:   Dry  and intact without significant lesions or rashes. Psychiatric: Oriented to person, place and time. Demonstrates good judgement and reason without abnormal affect or behaviors.  RELEVANT LABS AND IMAGING: CBC    Component Value Date/Time   WBC 4.4 05/08/2024 1554   WBC 6.5 09/19/2021 1525   RBC 4.71 05/08/2024 1554   RBC 4.52 09/19/2021 1525   HGB 14.1 05/08/2024 1554   HCT 43.3 05/08/2024 1554   PLT 169 05/08/2024 1554   MCV 92 05/08/2024 1554   MCH 29.9 05/08/2024 1554   MCH 28.5 09/19/2021 1525   MCHC 32.6 05/08/2024 1554   MCHC 33.6 09/19/2021 1525   RDW 14.4 05/08/2024 1554   LYMPHSABS 1.0 09/08/2021 1936   LYMPHSABS 2.2 09/05/2019 1423   MONOABS 1.0 09/08/2021 1936   EOSABS 0.2 09/08/2021 1936   EOSABS 0.2 09/05/2019 1423   BASOSABS 0.1 09/08/2021 1936   BASOSABS 0.1 09/05/2019 1423    CMP     Component Value Date/Time   NA 143 05/08/2024 1554   K 4.6 05/08/2024 1554   CL 108 (H) 05/08/2024 1554   CO2 21 05/08/2024 1554   GLUCOSE 84 05/08/2024 1554   GLUCOSE 95 09/19/2021 1525   BUN 15 05/08/2024 1554   CREATININE 1.31 (H) 05/08/2024 1554   CREATININE 1.01 12/01/2015 1437   CALCIUM  9.1 05/08/2024 1554   PROT 6.9 05/08/2024 1554   ALBUMIN  4.5 05/08/2024 1554   AST 28 05/08/2024 1554   ALT 19 05/08/2024 1554   ALKPHOS 110 05/08/2024 1554   BILITOT 0.2 05/08/2024 1554   GFRNONAA 49 (L) 09/19/2021 1525   GFRNONAA 83 10/15/2014 1459   GFRAA 66 03/27/2020 1617   GFRAA >89 10/15/2014 1459    Assessment: 1. ***  Plan: 1. ***     Calvin Failing, PA-C [facility] 08/23/2024, 10:46 AM  Cc: Vicci Barnie NOVAK, MD  "

## 2024-08-24 ENCOUNTER — Ambulatory Visit: Admitting: Physician Assistant

## 2024-09-19 ENCOUNTER — Ambulatory Visit: Admitting: Physician Assistant
